# Patient Record
Sex: Female | Born: 1945
Health system: Southern US, Community
[De-identification: ages and names within clinical notes are randomized; demographics above are authoritative.]

## PROBLEM LIST (undated history)

## (undated) DIAGNOSIS — F32A Depression, unspecified: Secondary | ICD-10-CM

## (undated) DIAGNOSIS — Z5189 Encounter for other specified aftercare: Secondary | ICD-10-CM

## (undated) DIAGNOSIS — F329 Major depressive disorder, single episode, unspecified: Secondary | ICD-10-CM

## (undated) DIAGNOSIS — G709 Myoneural disorder, unspecified: Secondary | ICD-10-CM

## (undated) HISTORY — PX: BRAIN SURGERY: SHX531

## (undated) HISTORY — DX: Depression, unspecified: F32.A

## (undated) HISTORY — DX: Myoneural disorder, unspecified: G70.9

## (undated) HISTORY — DX: Encounter for other specified aftercare: Z51.89

## (undated) HISTORY — PX: TUBAL LIGATION: SHX77

## (undated) HISTORY — DX: Major depressive disorder, single episode, unspecified: F32.9

---

## 2004-08-31 ENCOUNTER — Emergency Department (HOSPITAL_COMMUNITY): Admission: EM | Admit: 2004-08-31 | Discharge: 2004-08-31 | Payer: Self-pay | Admitting: *Deleted

## 2004-10-26 ENCOUNTER — Emergency Department (HOSPITAL_COMMUNITY): Admission: EM | Admit: 2004-10-26 | Discharge: 2004-10-26 | Payer: Self-pay | Admitting: Emergency Medicine

## 2005-03-10 ENCOUNTER — Emergency Department (HOSPITAL_COMMUNITY): Admission: EM | Admit: 2005-03-10 | Discharge: 2005-03-10 | Payer: Self-pay | Admitting: Emergency Medicine

## 2005-04-03 ENCOUNTER — Emergency Department (HOSPITAL_COMMUNITY): Admission: EM | Admit: 2005-04-03 | Discharge: 2005-04-03 | Payer: Self-pay | Admitting: Internal Medicine
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
## 2005-11-16 ENCOUNTER — Emergency Department (HOSPITAL_COMMUNITY): Admission: EM | Admit: 2005-11-16 | Discharge: 2005-11-16 | Payer: Self-pay | Admitting: Emergency Medicine

## 2005-11-24 ENCOUNTER — Emergency Department (HOSPITAL_COMMUNITY): Admission: EM | Admit: 2005-11-24 | Discharge: 2005-11-24 | Payer: Self-pay | Admitting: Emergency Medicine

## 2006-02-09 ENCOUNTER — Ambulatory Visit: Payer: Self-pay | Admitting: Internal Medicine

## 2006-03-12 ENCOUNTER — Emergency Department (HOSPITAL_COMMUNITY): Admission: EM | Admit: 2006-03-12 | Discharge: 2006-03-12 | Payer: Self-pay | Admitting: Emergency Medicine

## 2006-04-10 ENCOUNTER — Emergency Department (HOSPITAL_COMMUNITY): Admission: EM | Admit: 2006-04-10 | Discharge: 2006-04-10 | Payer: Self-pay | Admitting: Emergency Medicine

## 2006-05-30 ENCOUNTER — Emergency Department (HOSPITAL_COMMUNITY): Admission: EM | Admit: 2006-05-30 | Discharge: 2006-05-30 | Payer: Self-pay | Admitting: Emergency Medicine

## 2006-08-21 ENCOUNTER — Emergency Department (HOSPITAL_COMMUNITY): Admission: EM | Admit: 2006-08-21 | Discharge: 2006-08-21 | Payer: Self-pay | Admitting: Emergency Medicine

## 2006-08-21 IMAGING — CR DG CHEST 2V
2 series · 2 of 2 positions shown · non-contrast
Comparison: [DATE].

CLINICAL DATA: Cough/fever/chills.
 CHEST - 2 VIEW:

[w chest pa]
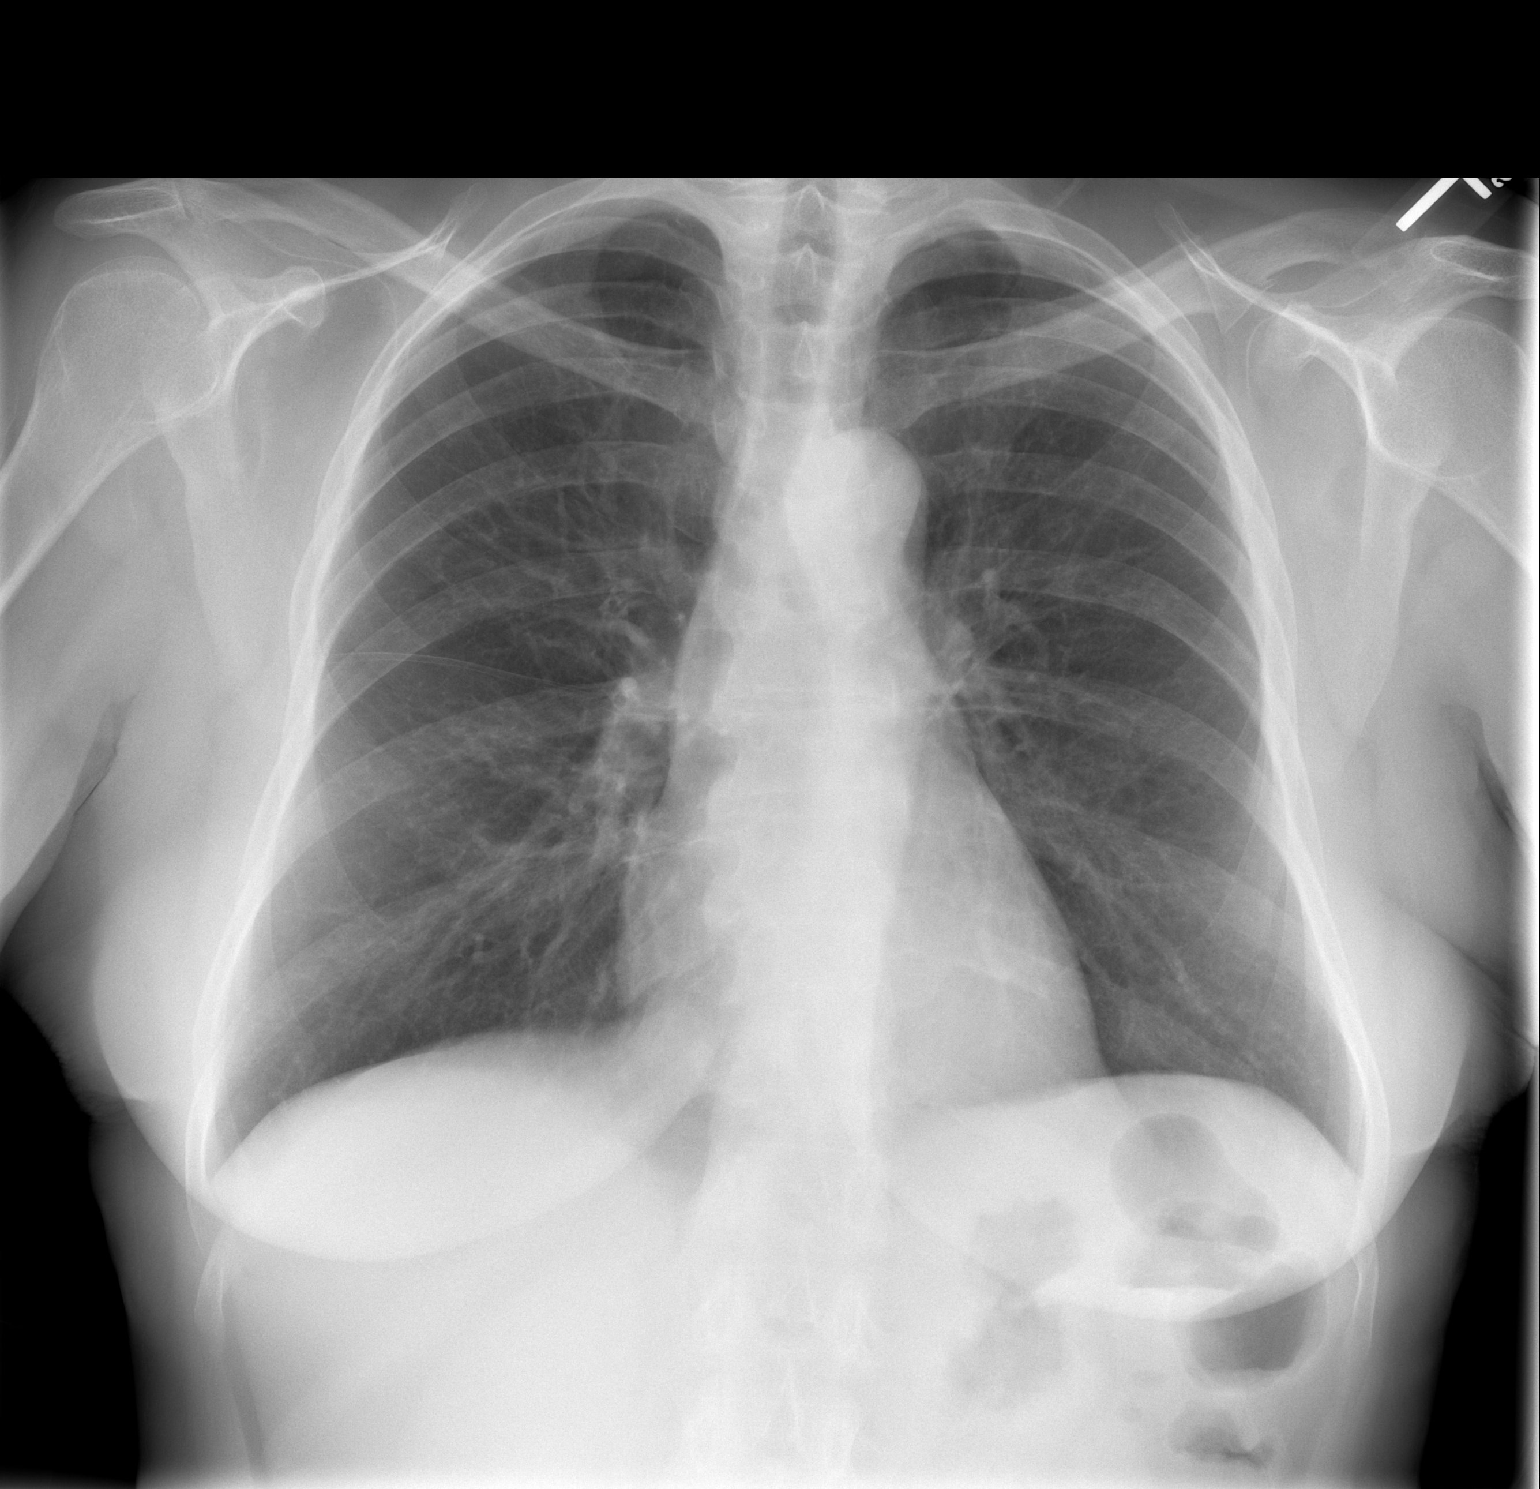

[w chest lat]
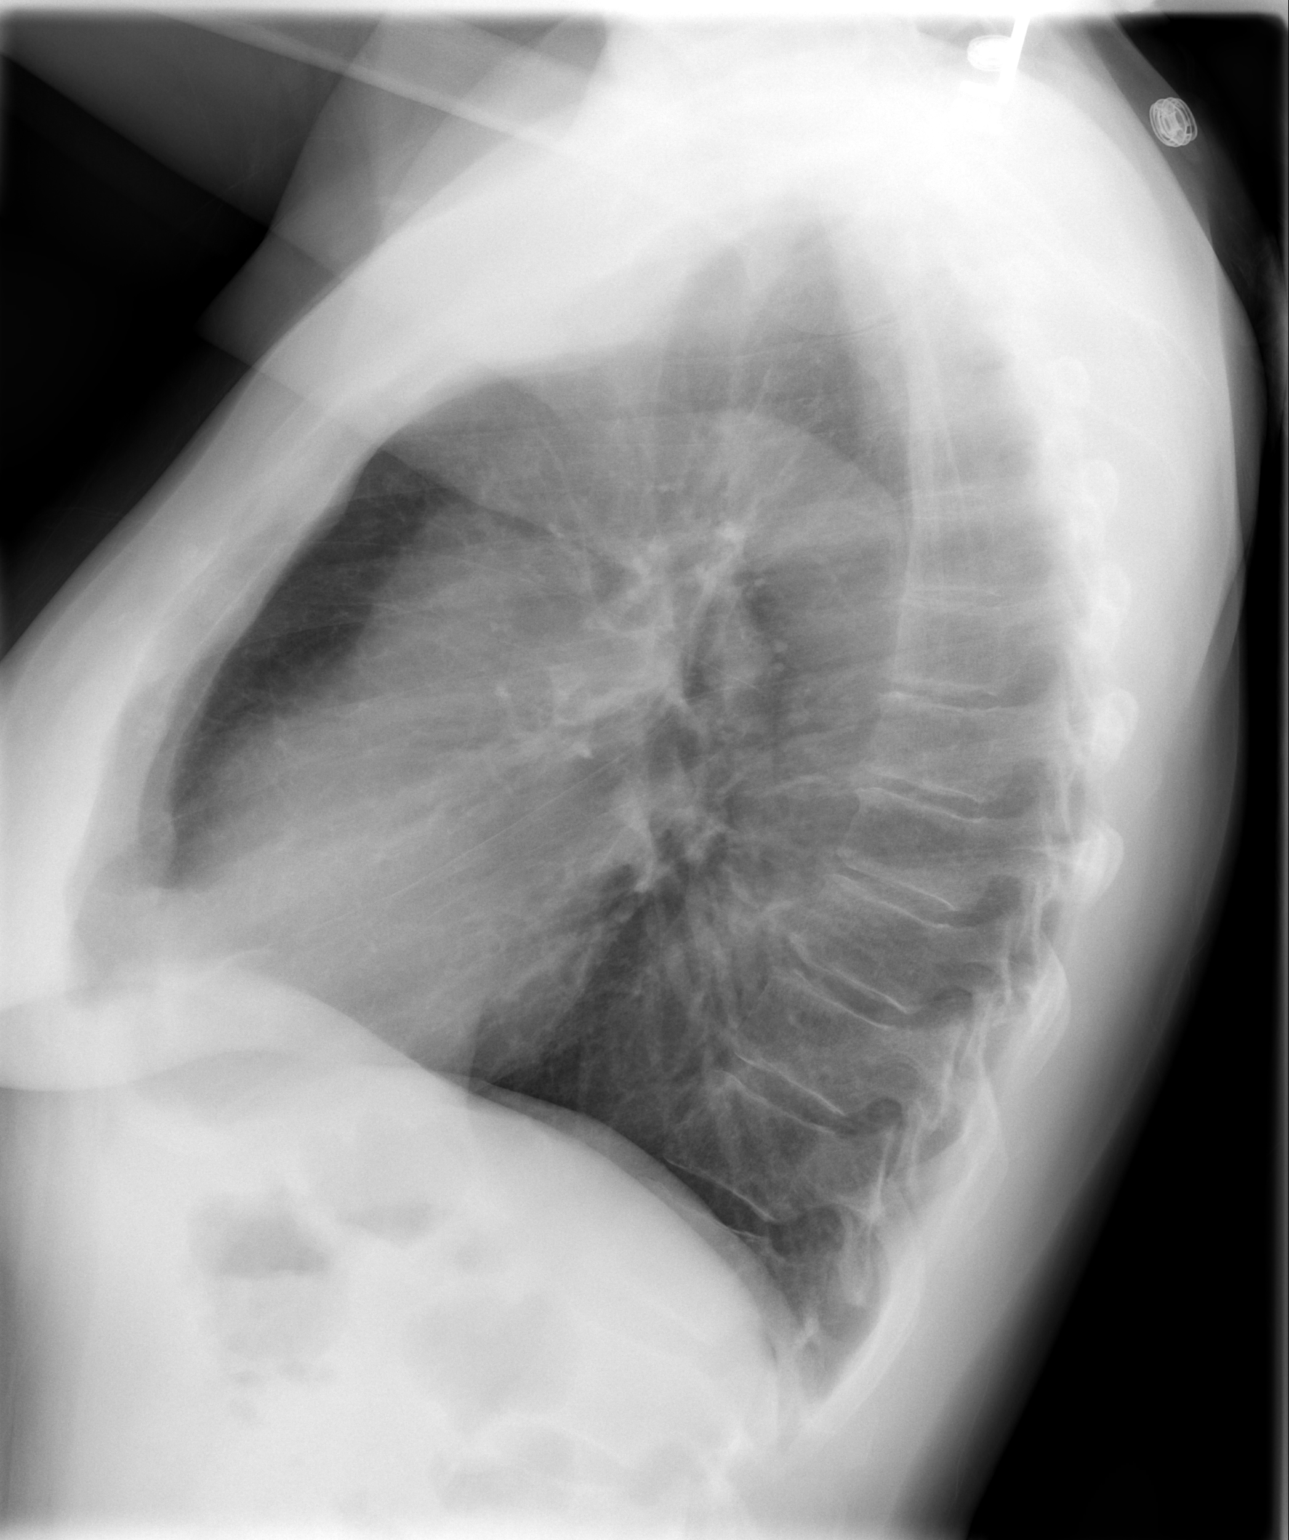

[2 of 2 positions shown; findings below may reference images not displayed]

FINDINGS: Heart and vascularity normal.  Slight peribronchial thickening as before. No acute process. No pleural fluid or osseous lesions.
IMPRESSION: No active disease.

## 2006-09-13 ENCOUNTER — Emergency Department (HOSPITAL_COMMUNITY): Admission: EM | Admit: 2006-09-13 | Discharge: 2006-09-13 | Payer: Self-pay | Admitting: Emergency Medicine
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
## 2007-02-13 ENCOUNTER — Emergency Department (HOSPITAL_COMMUNITY): Admission: EM | Admit: 2007-02-13 | Discharge: 2007-02-13 | Payer: Self-pay | Admitting: Emergency Medicine

## 2008-08-20 ENCOUNTER — Encounter: Admission: RE | Admit: 2008-08-20 | Discharge: 2008-08-20 | Payer: Self-pay | Admitting: Emergency Medicine

## 2008-08-20 ENCOUNTER — Emergency Department (HOSPITAL_COMMUNITY): Admission: EM | Admit: 2008-08-20 | Discharge: 2008-08-20 | Payer: Self-pay | Admitting: Emergency Medicine

## 2008-08-25 ENCOUNTER — Encounter: Admission: RE | Admit: 2008-08-25 | Discharge: 2008-08-25 | Payer: Self-pay | Admitting: Emergency Medicine

## 2008-09-01 ENCOUNTER — Encounter: Admission: RE | Admit: 2008-09-01 | Discharge: 2008-09-01 | Payer: Self-pay | Admitting: Emergency Medicine

## 2008-09-24 ENCOUNTER — Emergency Department (HOSPITAL_COMMUNITY): Admission: EM | Admit: 2008-09-24 | Discharge: 2008-09-24 | Payer: Self-pay | Admitting: Emergency Medicine

## 2010-01-22 ENCOUNTER — Encounter: Payer: Self-pay | Admitting: Emergency Medicine

## 2010-01-24 ENCOUNTER — Encounter: Payer: Self-pay | Admitting: Emergency Medicine

## 2010-04-08 LAB — COMPREHENSIVE METABOLIC PANEL
ALT: 16 U/L (ref 0–35)
AST: 17 U/L (ref 0–37)
Albumin: 3.3 g/dL — ABNORMAL LOW (ref 3.5–5.2)
Alkaline Phosphatase: 90 U/L (ref 39–117)
BUN: 13 mg/dL (ref 6–23)
CO2: 20 mEq/L (ref 19–32)
Calcium: 7.8 mg/dL — ABNORMAL LOW (ref 8.4–10.5)
Chloride: 110 mEq/L (ref 96–112)
Creatinine, Ser: 0.79 mg/dL (ref 0.4–1.2)
GFR calc Af Amer: >60 mL/min (ref >60)
GFR calc non Af Amer: >60 mL/min (ref >60)
Glucose, Bld: 123 mg/dL — ABNORMAL HIGH (ref 70–99)
Potassium: 3.4 mEq/L — ABNORMAL LOW (ref 3.5–5.1)
Sodium: 138 mEq/L (ref 135–145)
Total Bilirubin: 0.5 mg/dL (ref 0.3–1.2)
Total Protein: 5.6 g/dL — ABNORMAL LOW (ref 6.0–8.3)

## 2010-04-08 LAB — DIFFERENTIAL
Basophils Absolute: 0 10*3/uL (ref 0.0–0.1)
Basophils Relative: 0 % (ref 0–1)
Eosinophils Absolute: 0.1 10*3/uL (ref 0.0–0.7)
Eosinophils Relative: 1 % (ref 0–5)
Lymphocytes Relative: 20 % (ref 12–46)
Lymphs Abs: 1.2 10*3/uL (ref 0.7–4.0)
Monocytes Absolute: 0.3 10*3/uL (ref 0.1–1.0)
Monocytes Relative: 5 % (ref 3–12)
Neutro Abs: 4.8 10*3/uL (ref 1.7–7.7)
Neutrophils Relative %: 75 % (ref 43–77)

## 2010-04-08 LAB — CBC
HCT: 34.8 % — ABNORMAL LOW (ref 36.0–46.0)
Hemoglobin: 12.1 g/dL (ref 12.0–15.0)
MCHC: 34.6 g/dL (ref 30.0–36.0)
MCV: 85.8 fL (ref 78.0–100.0)
Platelets: 222 10*3/uL (ref 150–400)
RBC: 4.06 MIL/uL (ref 3.87–5.11)
RDW: 13 % (ref 11.5–15.5)
WBC: 6.4 10*3/uL (ref 4.0–10.5)

## 2010-04-08 LAB — URINE MICROSCOPIC-ADD ON

## 2010-04-08 LAB — URINALYSIS, ROUTINE W REFLEX MICROSCOPIC
Glucose, UA: NEGATIVE mg/dL
Hgb urine dipstick: NEGATIVE
Ketones, ur: 15 mg/dL — AB
Nitrite: POSITIVE — AB
Protein, ur: NEGATIVE mg/dL
Specific Gravity, Urine: 1.022 (ref 1.005–1.030)
Urobilinogen, UA: 0.2 mg/dL (ref 0.0–1.0)
pH: 5.5 (ref 5.0–8.0)

## 2010-04-08 LAB — LIPASE, BLOOD: Lipase: 37 U/L (ref 11–59)

## 2010-04-09 LAB — GC/CHLAMYDIA PROBE AMP, GENITAL
Chlamydia, DNA Probe: NEGATIVE
GC Probe Amp, Genital: NEGATIVE

## 2010-04-09 LAB — WET PREP, GENITAL
Clue Cells Wet Prep HPF POC: NONE SEEN
Yeast Wet Prep HPF POC: NONE SEEN

## 2010-06-14 ENCOUNTER — Other Ambulatory Visit: Payer: Self-pay | Admitting: Emergency Medicine

## 2010-06-14 DIAGNOSIS — Z1231 Encounter for screening mammogram for malignant neoplasm of breast: Secondary | ICD-10-CM

## 2010-06-20 ENCOUNTER — Ambulatory Visit: Payer: Self-pay

## 2010-09-23 LAB — URINALYSIS, ROUTINE W REFLEX MICROSCOPIC
Bilirubin Urine: NEGATIVE
Glucose, UA: NEGATIVE
Hgb urine dipstick: NEGATIVE
Ketones, ur: NEGATIVE
Nitrite: NEGATIVE
Protein, ur: NEGATIVE
Specific Gravity, Urine: 1.024
Urobilinogen, UA: 0.2
pH: 5.5

## 2010-09-23 LAB — WET PREP, GENITAL
Clue Cells Wet Prep HPF POC: NONE SEEN
Trich, Wet Prep: NONE SEEN
Yeast Wet Prep HPF POC: NONE SEEN

## 2010-09-23 LAB — GC/CHLAMYDIA PROBE AMP, GENITAL
Chlamydia, DNA Probe: NEGATIVE
GC Probe Amp, Genital: NEGATIVE

## 2010-09-23 LAB — URINE MICROSCOPIC-ADD ON

## 2010-10-02 ENCOUNTER — Ambulatory Visit (INDEPENDENT_AMBULATORY_CARE_PROVIDER_SITE_OTHER): Payer: 59

## 2010-10-02 ENCOUNTER — Inpatient Hospital Stay (INDEPENDENT_AMBULATORY_CARE_PROVIDER_SITE_OTHER)
Admission: RE | Admit: 2010-10-02 | Discharge: 2010-10-02 | Disposition: A | Discharge status: Home / Self Care | Payer: 59 | Source: Ambulatory Visit | Attending: Emergency Medicine | Admitting: Emergency Medicine

## 2010-10-02 ENCOUNTER — Ambulatory Visit (HOSPITAL_COMMUNITY): Payer: 59

## 2010-10-02 DIAGNOSIS — S63509A Unspecified sprain of unspecified wrist, initial encounter: Secondary | ICD-10-CM

## 2010-10-02 DIAGNOSIS — X58XXXA Exposure to other specified factors, initial encounter: Secondary | ICD-10-CM

## 2010-10-02 DIAGNOSIS — IMO0002 Reserved for concepts with insufficient information to code with codable children: Secondary | ICD-10-CM
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
## 2010-10-02 IMAGING — CR DG HAND COMPLETE 3+V*L*
3 series · 3 of 3 positions shown · non-contrast
Comparison: None.

CLINICAL DATA: Fell today with bilateral hand pain, worse at the
radial side of left wrist and first digit

LEFT HAND - COMPLETE 3+ VIEW

[view not recorded (1 of 3)]
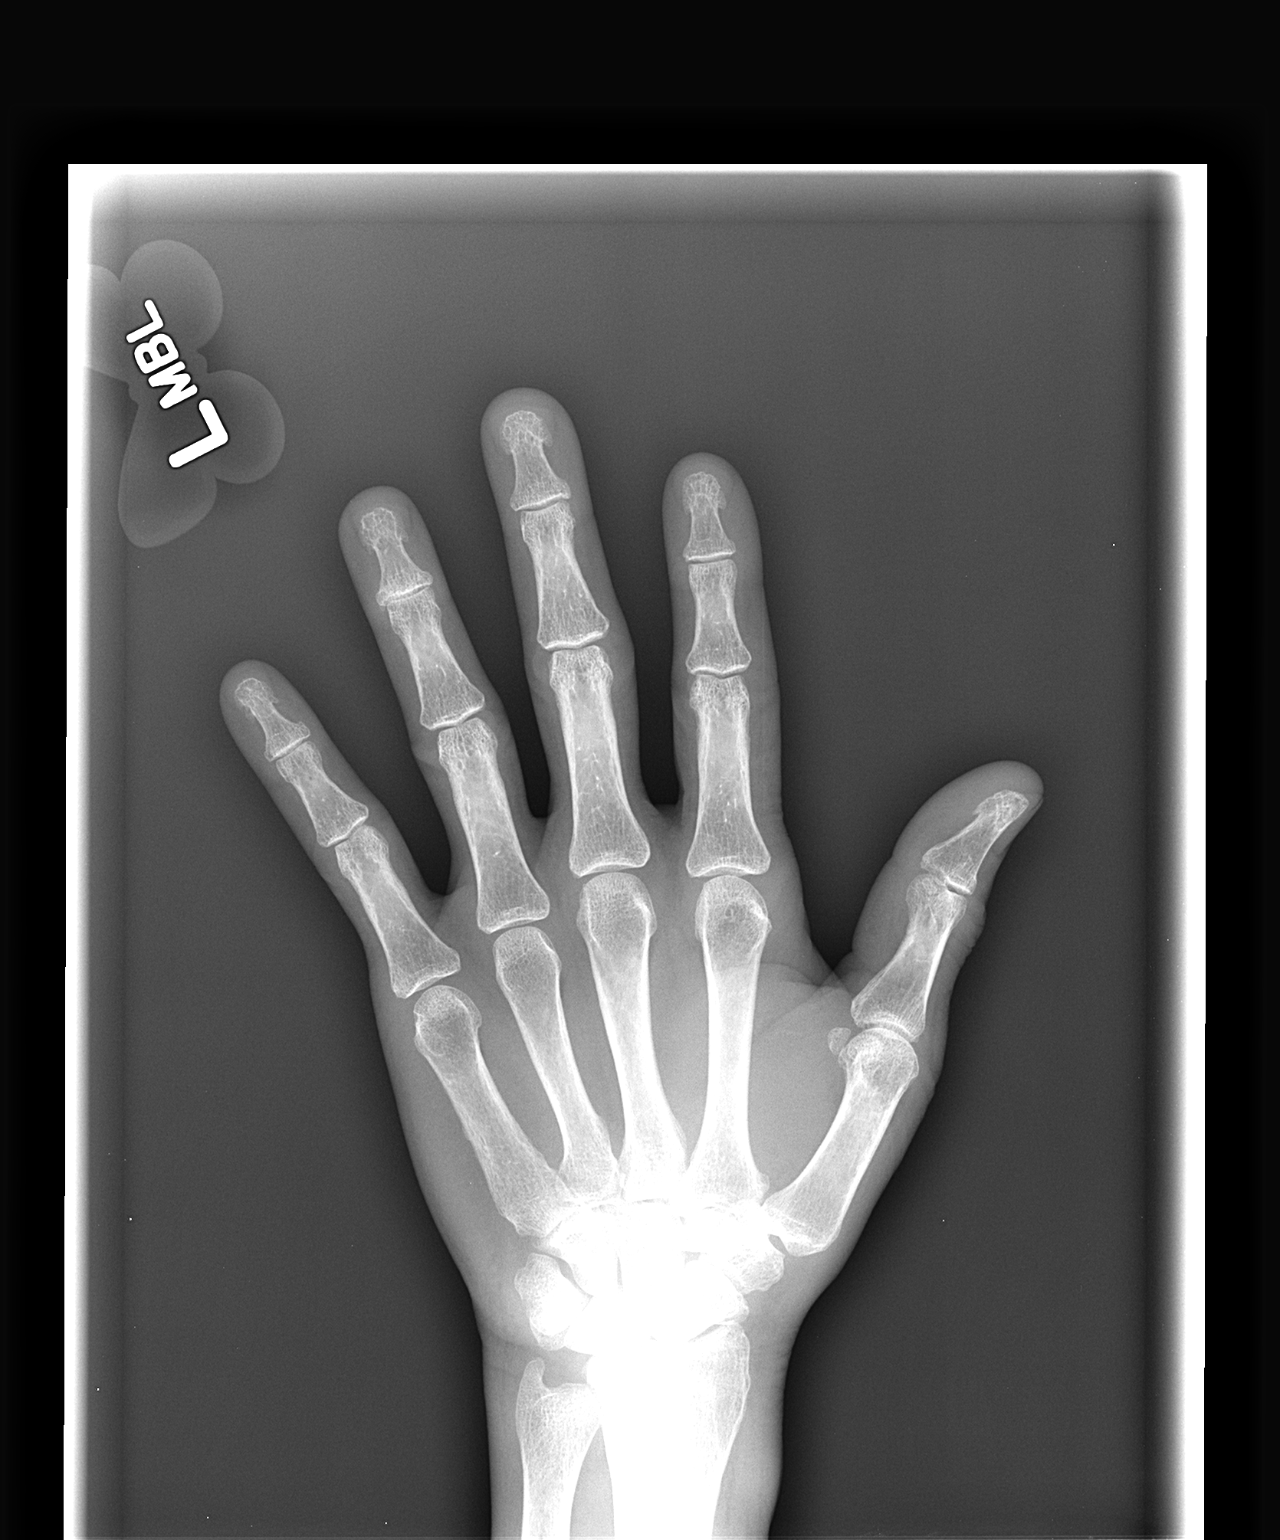

[view not recorded (2 of 3)]
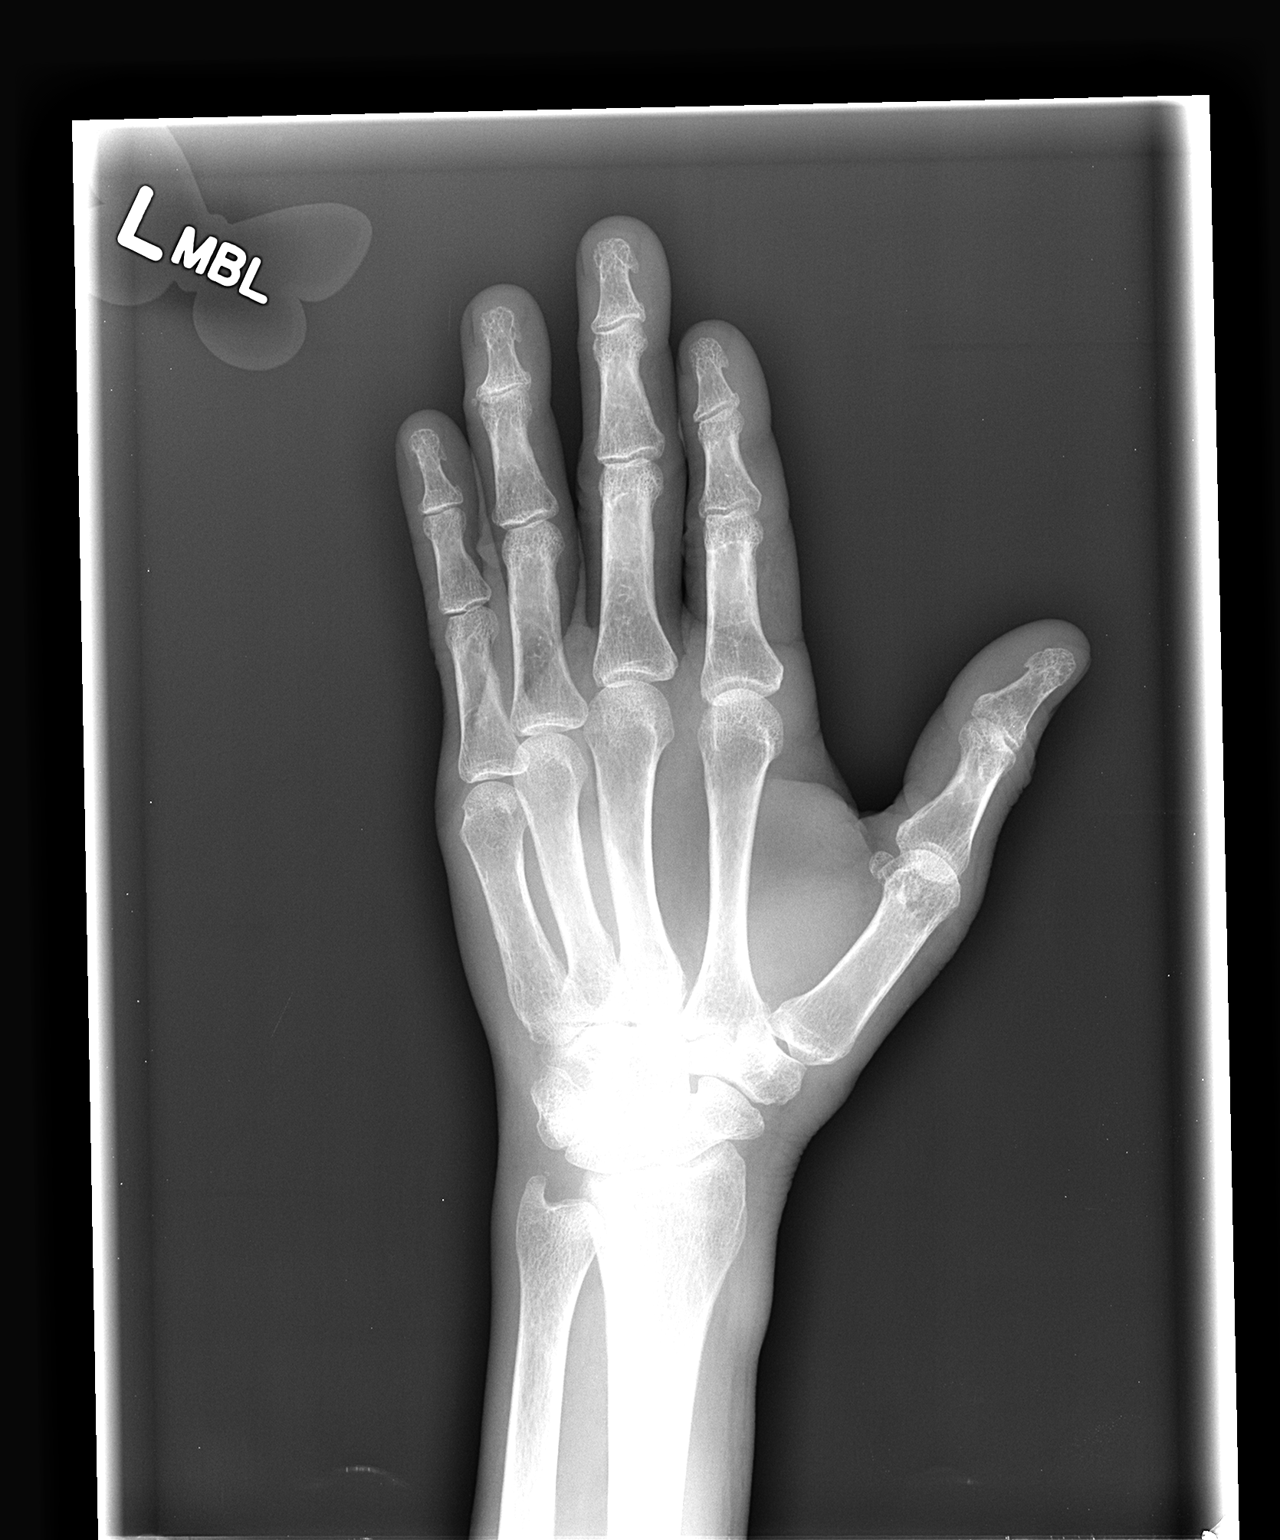

[view not recorded (3 of 3)]
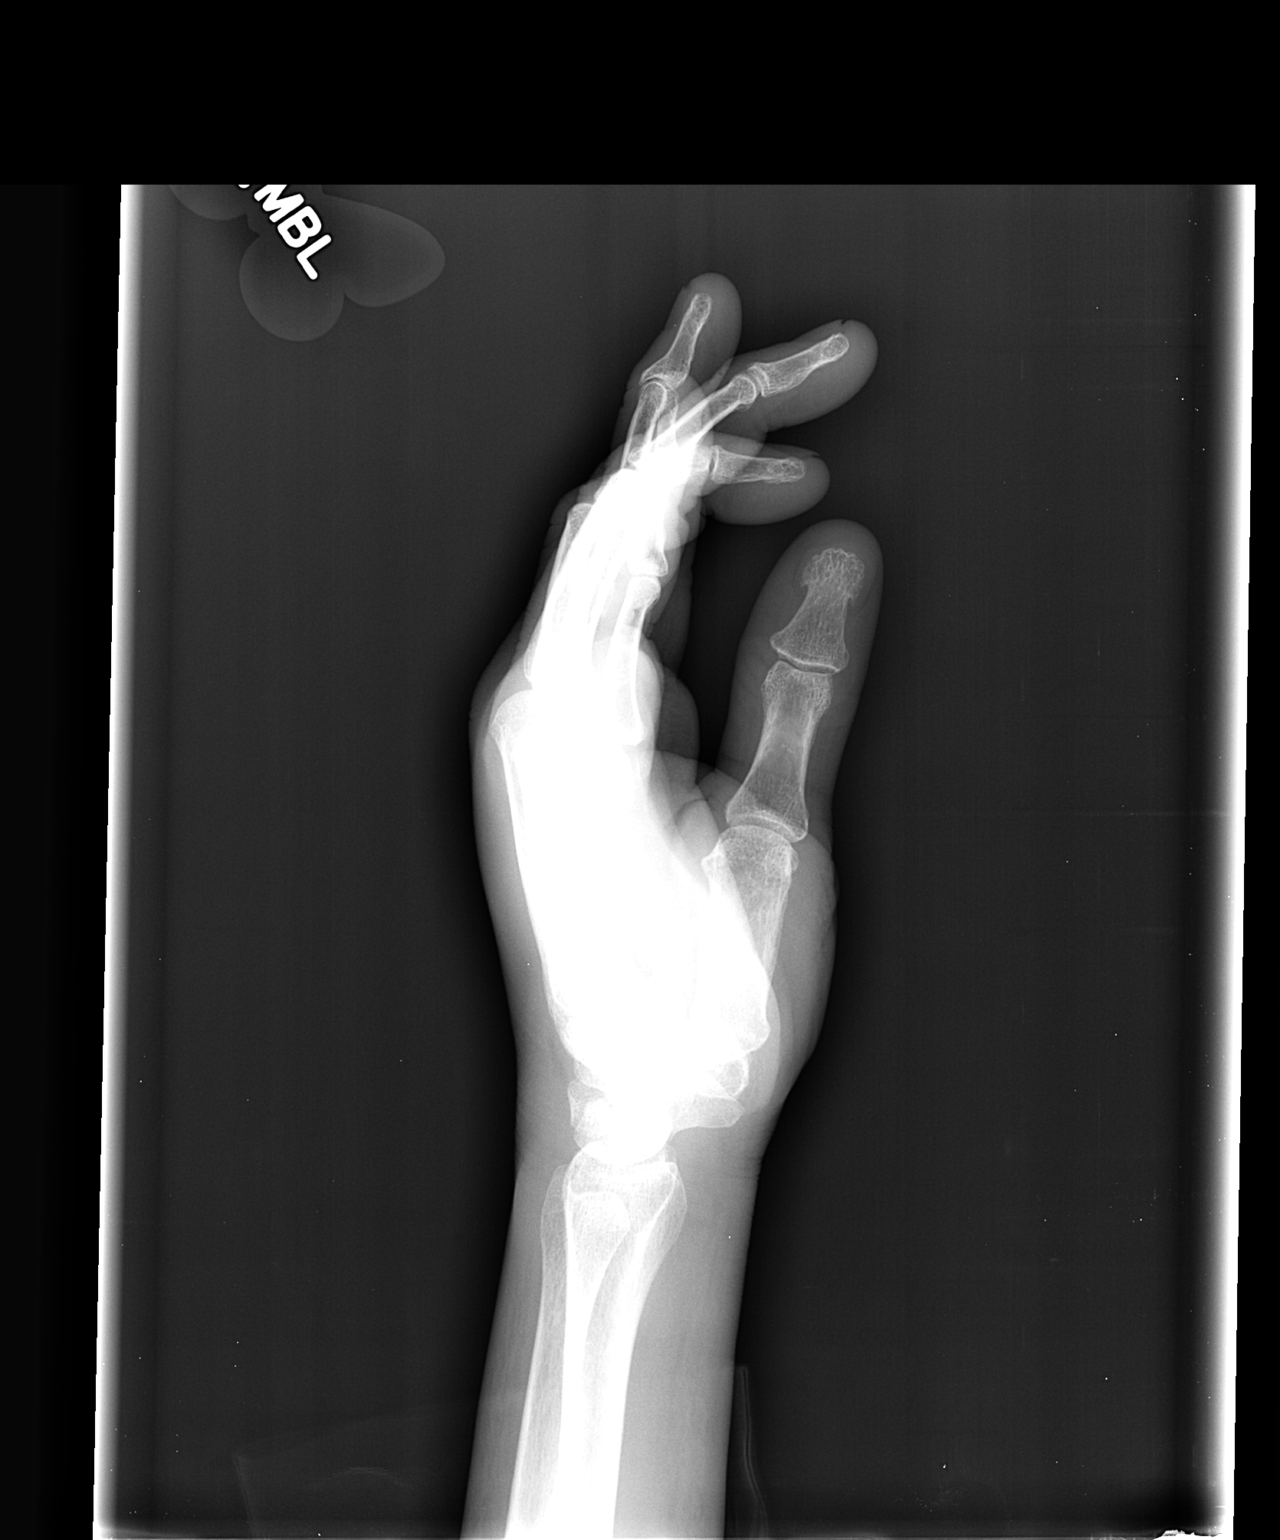

[3 of 3 positions shown; findings below may reference images not displayed]

FINDINGS: No fracture or dislocation. Small ossified structure
adjacent to the ulnar styloid process is favored to be the sequela
of remote injury.  Joint spaces are globally preserved.  No
erosions.  Regional soft tissues are normal.  No radiopaque foreign
body.
IMPRESSION: No fracture.

## 2010-10-14 LAB — URINALYSIS, ROUTINE W REFLEX MICROSCOPIC
Bilirubin Urine: NEGATIVE
Glucose, UA: NEGATIVE
Hgb urine dipstick: NEGATIVE
Ketones, ur: NEGATIVE
Nitrite: NEGATIVE
Protein, ur: NEGATIVE
Specific Gravity, Urine: 1.024
Urobilinogen, UA: 0.2
pH: 5.5

## 2010-10-14 LAB — URINE MICROSCOPIC-ADD ON

## 2010-10-14 LAB — WET PREP, GENITAL
Trich, Wet Prep: NONE SEEN
Yeast Wet Prep HPF POC: NONE SEEN

## 2010-10-14 LAB — URINE CULTURE
Colony Count: NO GROWTH
Culture: NO GROWTH

## 2010-10-14 LAB — GC/CHLAMYDIA PROBE AMP, GENITAL
Chlamydia, DNA Probe: NEGATIVE
GC Probe Amp, Genital: NEGATIVE

## 2010-10-14 LAB — RAPID STREP SCREEN (MED CTR MEBANE ONLY): Streptococcus, Group A Screen (Direct): NEGATIVE

## 2010-10-14 LAB — RPR: RPR Ser Ql: NONREACTIVE

## 2011-05-22 ENCOUNTER — Encounter (HOSPITAL_COMMUNITY): Payer: Self-pay | Admitting: *Deleted

## 2011-05-22 ENCOUNTER — Emergency Department (HOSPITAL_COMMUNITY)
Admission: EM | Admit: 2011-05-22 | Discharge: 2011-05-22 | Disposition: A | Discharge status: Home / Self Care | Payer: Medicare Other | Attending: Emergency Medicine | Admitting: Emergency Medicine

## 2011-05-22 DIAGNOSIS — M79609 Pain in unspecified limb: Secondary | ICD-10-CM | POA: Insufficient documentation

## 2011-05-22 DIAGNOSIS — L84 Corns and callosities: Secondary | ICD-10-CM | POA: Insufficient documentation

## 2011-05-22 MED ORDER — HYDROCODONE-ACETAMINOPHEN 5-325 MG PO TABS: 1.0000 | ORAL_TABLET | ORAL | Status: AC | PRN

## 2011-05-22 MED ORDER — HYDROCODONE-ACETAMINOPHEN 5-325 MG PO TABS
1.0000 | ORAL_TABLET | Freq: Once | ORAL | Status: AC
  Administered 2011-05-22: 1 via ORAL
  Filled 2011-05-22: qty 1

## 2011-05-22 NOTE — ED Provider Notes (Signed)
History     CSN: [id number]  Arrival date & time 05/22/11  1324   First MD Initiated Contact with Patient 05/22/11 [phone]      Chief Complaint  Patient presents with  . Toe Pain    (Consider location/radiation/quality/duration/timing/severity/associated sxs/prior treatment) Patient is a 66 y.o. female presenting with toe pain. The history is provided by the patient.  Toe Pain The current episode started 1 to 4 weeks ago. The problem occurs constantly. The problem has been gradually worsening. Pertinent negatives include no fever. Associated symptoms comments: Painful growth on right 5th toe. No drainage, redness. .    No past medical history on file.  Past Surgical History  Procedure Date  . Brain surgery     No family history on file.  History  Substance Use Topics  . Smoking status: Not on file  . Smokeless tobacco: Not on file  . Alcohol Use: 1.2 oz/week    2 Cans of beer per week    OB History    Grav Para Term Preterm Abortions TAB SAB Ect Mult Living                  Review of Systems  Constitutional: Negative for fever.  Musculoskeletal: Negative.   Skin:       See HPI.    Allergies  Review of patient's allergies indicates no known allergies.  Home Medications   Current Outpatient Rx  Name Route Sig Dispense Refill  . BONE DENSITY PO Oral Take 1 tablet by mouth daily.    Marland Kitchen VITAMIN D PO Oral Take 1 tablet by mouth daily.      BP 134/64  Pulse 68  Temp(Src) 98.2 F (36.8 C) (Oral)  Resp 16  SpO2 98%  Physical Exam  Skin:       Right 5th toe with circular, hyperkertinized lesion c/w corn. No fluctuance, redness, drainage. Significantly tender.    ED Course  Procedures (including critical care time)  Labs Reviewed - No data to display No results found.   No diagnosis found. 1. Corn, right 5th toe   MDM  No abscess or secondary abscess. Will treat pain and refer to ortho, podiatry.        Rodena Medin, PA-C 05/22/11  0900

## 2011-05-22 NOTE — Discharge Instructions (Signed)
FOLLOW UP WITH A PODIATRIST OR AN ORTHOPEDIST FOR FURTHER MANAGEMENT OF PAINFUL CORN ON RIGHT FOOT. TAKE NORCO FOR PAIN AS DIRECTED. RETURN HERE AS NEEDED.  Corns and Calluses Corns are small areas of thickened skin that usually occur on the top, sides, or tip of a toe. They contain a cone-shaped core with a point that can press on a nerve below. This causes pain. Calluses are areas of thickened skin that usually develop on hands, fingers, palms, soles of the feet, and heels. These are areas that experience frequent friction or pressure. CAUSES  Corns are usually the result of rubbing (friction) or pressure from shoes that are too tight or do not fit properly. Calluses are caused by repeated friction and pressure on the affected areas. SYMPTOMS  A hard growth on the skin.   Pain or tenderness under the skin.   Sometimes, redness and swelling.   Increased discomfort while wearing tight-fitting shoes.  DIAGNOSIS  Your caregiver can usually tell what the problem is by doing a physical exam. TREATMENT  Removing the cause of the friction or pressure is usually the only treatment needed. However, sometimes medicines can be used to help soften the hardened, thickened areas. These medicines include salicylic acid plasters and 12% ammonium lactate lotion. These medicines should only be used under the direction of your caregiver. HOME CARE INSTRUCTIONS   Try to remove pressure from the affected area.   You may wear donut-shaped corn pads to protect your skin.   You may use a pumice stone or nonmetallic nail file to gently reduce the thickness of a corn.   Wear properly fitted footwear.   If you have calluses on the hands, wear gloves during activities that cause friction.   If you have diabetes, you should regularly examine your feet. Tell your caregiver if you notice any problems with your feet.  SEEK IMMEDIATE MEDICAL CARE IF:   You have increased pain, swelling, redness, or warmth in the  affected area.   Your corn or callus starts to drain fluid or bleeds.   You are not getting better, even with treatment.  Document Released: 09/25/2003 Document Revised: 12/08/2010 Document Reviewed: 08/16/2010 [facility] Patient Information 2012 [place], [place].

## 2011-05-22 NOTE — ED Notes (Signed)
Pt has had pain in her 5th toe of right foot for one month. Pain is worsening, has tried corn remover and to remove the corn herself. Pain is "shooting through my foot".

## 2011-05-24 NOTE — ED Provider Notes (Signed)
Medical screening examination/treatment/procedure(s) were performed by non-physician practitioner and as supervising physician I was immediately available for consultation/collaboration.   Gavin Pound. Brick Ketcher, MD 05/24/11 1554

## 2011-06-01 DIAGNOSIS — G500 Trigeminal neuralgia: Secondary | ICD-10-CM | POA: Insufficient documentation

## 2012-01-23 ENCOUNTER — Other Ambulatory Visit: Payer: Self-pay | Admitting: Emergency Medicine

## 2012-01-23 DIAGNOSIS — Z1231 Encounter for screening mammogram for malignant neoplasm of breast: Secondary | ICD-10-CM

## 2012-02-07 ENCOUNTER — Ambulatory Visit: Payer: Medicare Other

## 2012-12-02 ENCOUNTER — Ambulatory Visit: Payer: PRIVATE HEALTH INSURANCE

## 2012-12-02 ENCOUNTER — Ambulatory Visit (INDEPENDENT_AMBULATORY_CARE_PROVIDER_SITE_OTHER): Payer: PRIVATE HEALTH INSURANCE | Admitting: Emergency Medicine

## 2012-12-02 DIAGNOSIS — M20012 Mallet finger of left finger(s): Secondary | ICD-10-CM

## 2012-12-02 DIAGNOSIS — K219 Gastro-esophageal reflux disease without esophagitis: Secondary | ICD-10-CM

## 2012-12-02 DIAGNOSIS — M20019 Mallet finger of unspecified finger(s): Secondary | ICD-10-CM

## 2012-12-02 DIAGNOSIS — M79609 Pain in unspecified limb: Secondary | ICD-10-CM

## 2012-12-02 LAB — POCT CBC
Granulocyte percent: 52.4 %G (ref 37–80)
HCT, POC: 47 % (ref 37.7–47.9)
Hemoglobin: 14.6 g/dL (ref 12.2–16.2)
Lymph, poc: 2.9 (ref 0.6–3.4)
MCH, POC: 28 pg (ref 27–31.2)
MCHC: 31.1 g/dL — AB (ref 31.8–35.4)
MCV: 90 fL (ref 80–97)
MID (cbc): 0.4 (ref 0–0.9)
MPV: 9.3 fL (ref 0–99.8)
POC Granulocyte: 3.6 (ref 2–6.9)
POC LYMPH PERCENT: 41.7 %L (ref 10–50)
POC MID %: 5.9 %M (ref 0–12)
Platelet Count, POC: 327 10*3/uL (ref 142–424)
RBC: 5.22 M/uL (ref 4.04–5.48)
RDW, POC: 13.3 %
WBC: 6.9 10*3/uL (ref 4.6–10.2)

## 2012-12-02 IMAGING — CR DG FINGER RING 2+V*L*
1 series · 1 of 1 positions shown · non-contrast
Comparison: Left hand radiographs [DATE]

CLINICAL DATA: Pain left ring finger

EXAM:
LEFT RING FINGER 2+V

[PA]
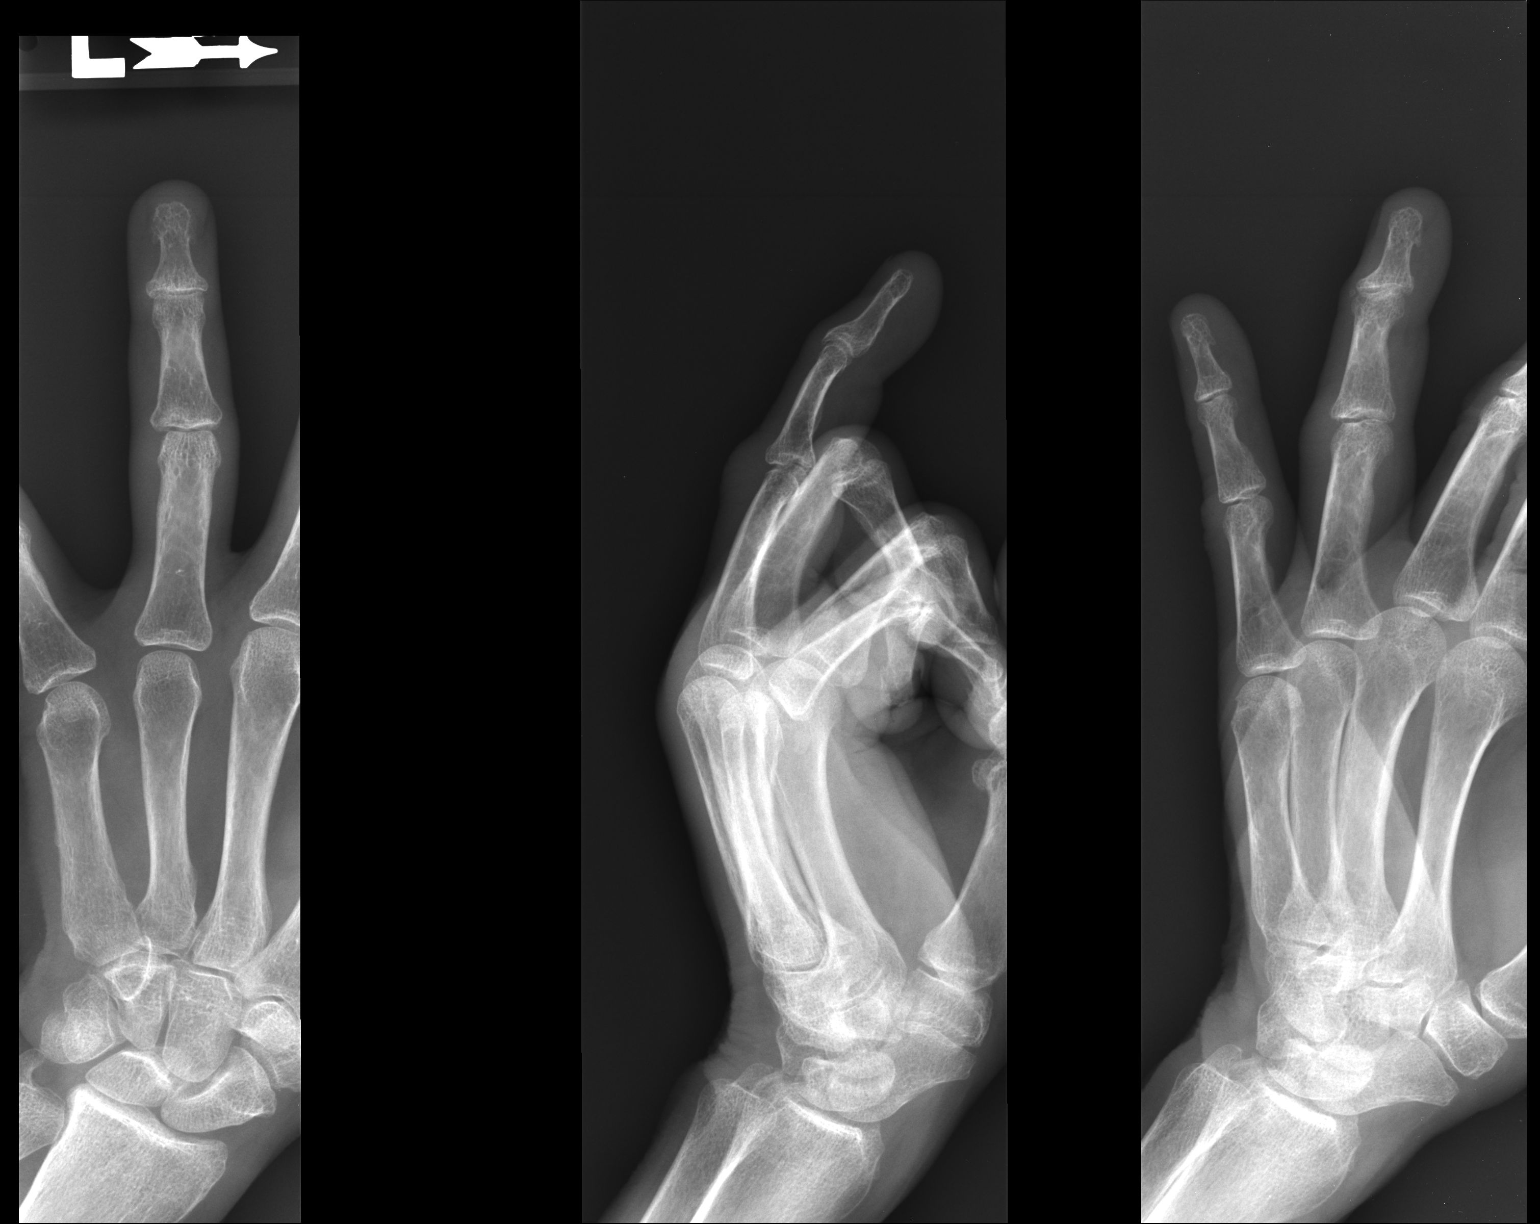

[1 of 1 positions shown; findings below may reference images not displayed]

FINDINGS: Soft tissue swelling at PIP joint.

Mild scattered joint space narrowing.

Bones appear demineralized.

Probable bone destruction at the base of the middle phalanx of the
left ring finger.

This could be the sequela of an arthritic process or prior trauma.

No definite acute fracture, dislocation or bone destruction.
IMPRESSION: Probable destructive process of the base of the middle phalanx
involving the articular surface, question sequela of an arthritic
process or prior trauma.

Scattered degenerative changes and osseous demineralization.

## 2012-12-02 MED ORDER — BONE DENSITY 300-200 MG-UNIT PO TABS: 1.0000 | ORAL_TABLET | Freq: Two times a day (BID) | ORAL | Status: DC

## 2012-12-02 MED ORDER — LANSOPRAZOLE 30 MG PO CPDR: 30.0000 mg | DELAYED_RELEASE_CAPSULE | Freq: Every day | ORAL | Status: DC

## 2012-12-02 MED ORDER — SUCRALFATE 1 G PO TABS: ORAL_TABLET | ORAL | Status: DC

## 2012-12-02 MED ORDER — CHOLECALCIFEROL 1.25 MG (50000 UT) PO TABS: 1.0000 | ORAL_TABLET | ORAL | Status: DC

## 2012-12-02 NOTE — Progress Notes (Signed)
[facility] [address] [place] [address] [phone]  Date:  12/02/2012   Name:  Tracy Barton   DOB:  Nov 08, 1945   MRN:  [id number]  PCP:  Lucilla Edin, MD    Chief Complaint: Hand Pain and Medication Refill   History of Present Illness:  Tracy Barton is a 67 y.o. very pleasant female patient who presents with the following:  Comes to office for medication refill.  Says she needs vitamin D, a calcium supplement and something she takes for reflux.  Has not been here since before Epic.  Has frequent reflux symptoms with heartburn with anything that she eats. No excess caffeine or alcohol.  No NSAID or ASA frequently.  No GI bleeding, nausea or vomiting.  No stool change.  Injured her left fourth finger when she jammed it on Sunday.  No improvement with over the counter medications or other home remedies. Denies other complaint or health concern today.   There are no active problems to display for this patient.   Past Medical History  Diagnosis Date  . Neuromuscular disorder   . Depression     Past Surgical History  Procedure Laterality Date  . Brain surgery      History  Substance Use Topics  . Smoking status: Current Some Day Smoker -- 0.50 packs/day  . Smokeless tobacco: Not on file  . Alcohol Use: 1.2 oz/week    2 Cans of beer per week    No family history on file.  No Known Allergies  Medication list has been reviewed and updated.  Current Outpatient Prescriptions on File Prior to Visit  Medication Sig Dispense Refill  . Calcium-Vit D-Arg-Inos-Silicon (BONE DENSITY PO) Take 1 tablet by mouth daily.      . Cholecalciferol (VITAMIN D PO) Take 1 tablet by mouth daily.       No current facility-administered medications on file prior to visit.    Review of Systems:  As per HPI, otherwise negative.    Physical Examination: Filed Vitals:   12/02/12 1441  BP: 118/62  Pulse: 68  Temp: 98.3 F (36.8 C)  Resp: 16   Filed  Vitals:   12/02/12 1441  Height: 5\' 4"  (1.626 m)  Weight: 117 lb 6.4 oz (53.252 kg)   Body mass index is 20.14 kg/(m^2). Ideal Body Weight: Weight in (lb) to have BMI = 25: 145.3  GEN: WDWN, NAD, Non-toxic, A & O x 3 HEENT: Atraumatic, Normocephalic. Neck supple. No masses, No LAD. Ears and Nose: No external deformity. CV: RRR, No M/G/R. No JVD. No thrill. No extra heart sounds. PULM: CTA B, no wheezes, crackles, rhonchi. No retractions. No resp. distress. No accessory muscle use. ABD: S, NT, ND, +BS. No rebound. No HSM. EXTR: No c/c/e NEURO Normal gait.  PSYCH: Normally interactive. Conversant. Not depressed or anxious appearing.  Calm demeanor.  LEFT hand:  Visible apparent mallet finger deformity with local tenderness at DIP.  Assessment and Plan: Mallet finger Ortho consult GERD Labs  Signed,  Phillips Odor, MD   [facility] reading (PRIMARY) by  Dr. Dareen Piano.  No fracture.  Results for orders placed in visit on 12/02/12  POCT CBC      Result Value Range   WBC 6.9  4.6 - 10.2 K/uL   Lymph, poc 2.9  0.6 - 3.4   POC LYMPH PERCENT 41.7  10 - 50 %L   MID (cbc) 0.4  0 - 0.9   POC MID % 5.9  0 - 12 %M   POC Granulocyte 3.6  2 - 6.9   Granulocyte percent 52.4  37 - 80 %G   RBC 5.22  4.04 - 5.48 M/uL   Hemoglobin 14.6  12.2 - 16.2 g/dL   HCT, POC 16.1  09.6 - 47.9 %   MCV 90.0  80 - 97 fL   MCH, POC 28.0  27 - 31.2 pg   MCHC 31.1 (*) 31.8 - 35.4 g/dL   RDW, POC 04.5     Platelet Count, POC 327  142 - 424 K/uL   MPV 9.3  0 - 99.8 fL

## 2012-12-02 NOTE — Patient Instructions (Signed)
Place gastroesophageal reflux disease patient instructions here. Mallet Finger A mallet, or jammed, finger occurs when the end of a straightened finger or thumb receives a blow (often from a ball). This causes a disruption (tearing) of the extensor tendon (cord like structure which attaches muscle to bone) that straightens the end of your finger. The last joint in your finger will droop and you cannot extend it. Sometimes this is associated with a small fracture (break in bone) of the base of the end bone (phalange) in your finger. It usually takes 4 to 5 weeks to heal. HOME CARE INSTRUCTIONS   Apply ice to the sore finger for 15-20 minutes, 03-04 times per day for 2 days. Put the ice in a plastic bag and place a towel between the bag of ice and your skin.  If you have a finger splint, wear your splint as directed.  You may remove the splint to wash your finger or as directed.  If your splint is off, do not try to bend the tip of your finger.  Put your splint back on as soon as possible. If your finger is numb or tingling, the splint is probably too tight. You can loosen it so it is comfortable.  Move the part of your injured finger that is not covered by the splint several times a day.  Take medications as directed by your caregiver. Only take over-the-counter or prescription medicines for pain, discomfort, or fever as directed by your caregiver.  IMPORTANT: follow up with your caregiver or keep or call for any appointments with specialists as directed. The failure to follow up could result in chronic pain and / or disability. SEEK MEDICAL CARE IF:   You have increased pain or swelling.  You notice coldness of your finger.  After treatment you still cannot extend your finger. SEEK IMMEDIATE MEDICAL CARE IF:  Your finger is swollen and very red, white, blue, numb, cold, or tingling. MAKE SURE YOU:   Understand these instructions.  Will watch your condition.  Will get help right away  if you are not doing well or get worse. Document Released: 12/17/1999 Document Revised: 03/13/2011 Document Reviewed: 08/02/2007 [facility] Patient Information 2014 [place], [place].

## 2012-12-03 LAB — H. PYLORI ANTIBODY, IGG: H Pylori IgG: 7.23 {ISR} — ABNORMAL HIGH

## 2012-12-04 ENCOUNTER — Other Ambulatory Visit: Payer: Self-pay | Admitting: Emergency Medicine

## 2012-12-04 MED ORDER — AMOXICILL-CLARITHRO-LANSOPRAZ PO MISC: Freq: Two times a day (BID) | ORAL | Status: DC

## 2012-12-05 ENCOUNTER — Other Ambulatory Visit: Payer: Self-pay | Admitting: Radiology

## 2012-12-05 MED ORDER — AMOXICILLIN 500 MG PO CAPS: 500.0000 mg | ORAL_CAPSULE | Freq: Two times a day (BID) | ORAL | Status: DC

## 2012-12-05 MED ORDER — CLARITHROMYCIN ER 500 MG PO TB24: 1000.0000 mg | ORAL_TABLET | Freq: Two times a day (BID) | ORAL | Status: DC

## 2012-12-05 NOTE — Telephone Encounter (Signed)
Insurance will not cover the prev pack, please send in the rx's split, she already has the prevacid, pended the others (review first make sure my sig/ quantity are what you desire)

## 2013-02-06 ENCOUNTER — Encounter (HOSPITAL_COMMUNITY): Payer: Self-pay | Admitting: Emergency Medicine

## 2013-02-06 ENCOUNTER — Emergency Department (HOSPITAL_COMMUNITY): Payer: Medicare Other

## 2013-02-06 ENCOUNTER — Emergency Department (HOSPITAL_COMMUNITY)
Admission: EM | Admit: 2013-02-06 | Discharge: 2013-02-06 | Disposition: A | Discharge status: Home / Self Care | Payer: Medicare Other | Attending: Emergency Medicine | Admitting: Emergency Medicine

## 2013-02-06 DIAGNOSIS — Z8669 Personal history of other diseases of the nervous system and sense organs: Secondary | ICD-10-CM | POA: Insufficient documentation

## 2013-02-06 DIAGNOSIS — Z79899 Other long term (current) drug therapy: Secondary | ICD-10-CM | POA: Insufficient documentation

## 2013-02-06 DIAGNOSIS — M25559 Pain in unspecified hip: Secondary | ICD-10-CM | POA: Insufficient documentation

## 2013-02-06 DIAGNOSIS — M25551 Pain in right hip: Secondary | ICD-10-CM

## 2013-02-06 DIAGNOSIS — F17200 Nicotine dependence, unspecified, uncomplicated: Secondary | ICD-10-CM | POA: Insufficient documentation

## 2013-02-06 DIAGNOSIS — Z8659 Personal history of other mental and behavioral disorders: Secondary | ICD-10-CM | POA: Insufficient documentation

## 2013-02-06 IMAGING — CR DG HIP COMPLETE 2+V*R*
3 series · 3 of 3 positions shown · non-contrast
Comparison: None.

CLINICAL DATA: Chronic right hip pain

EXAM:
RIGHT HIP - COMPLETE 2+ VIEW

[t pelvis a.p.]
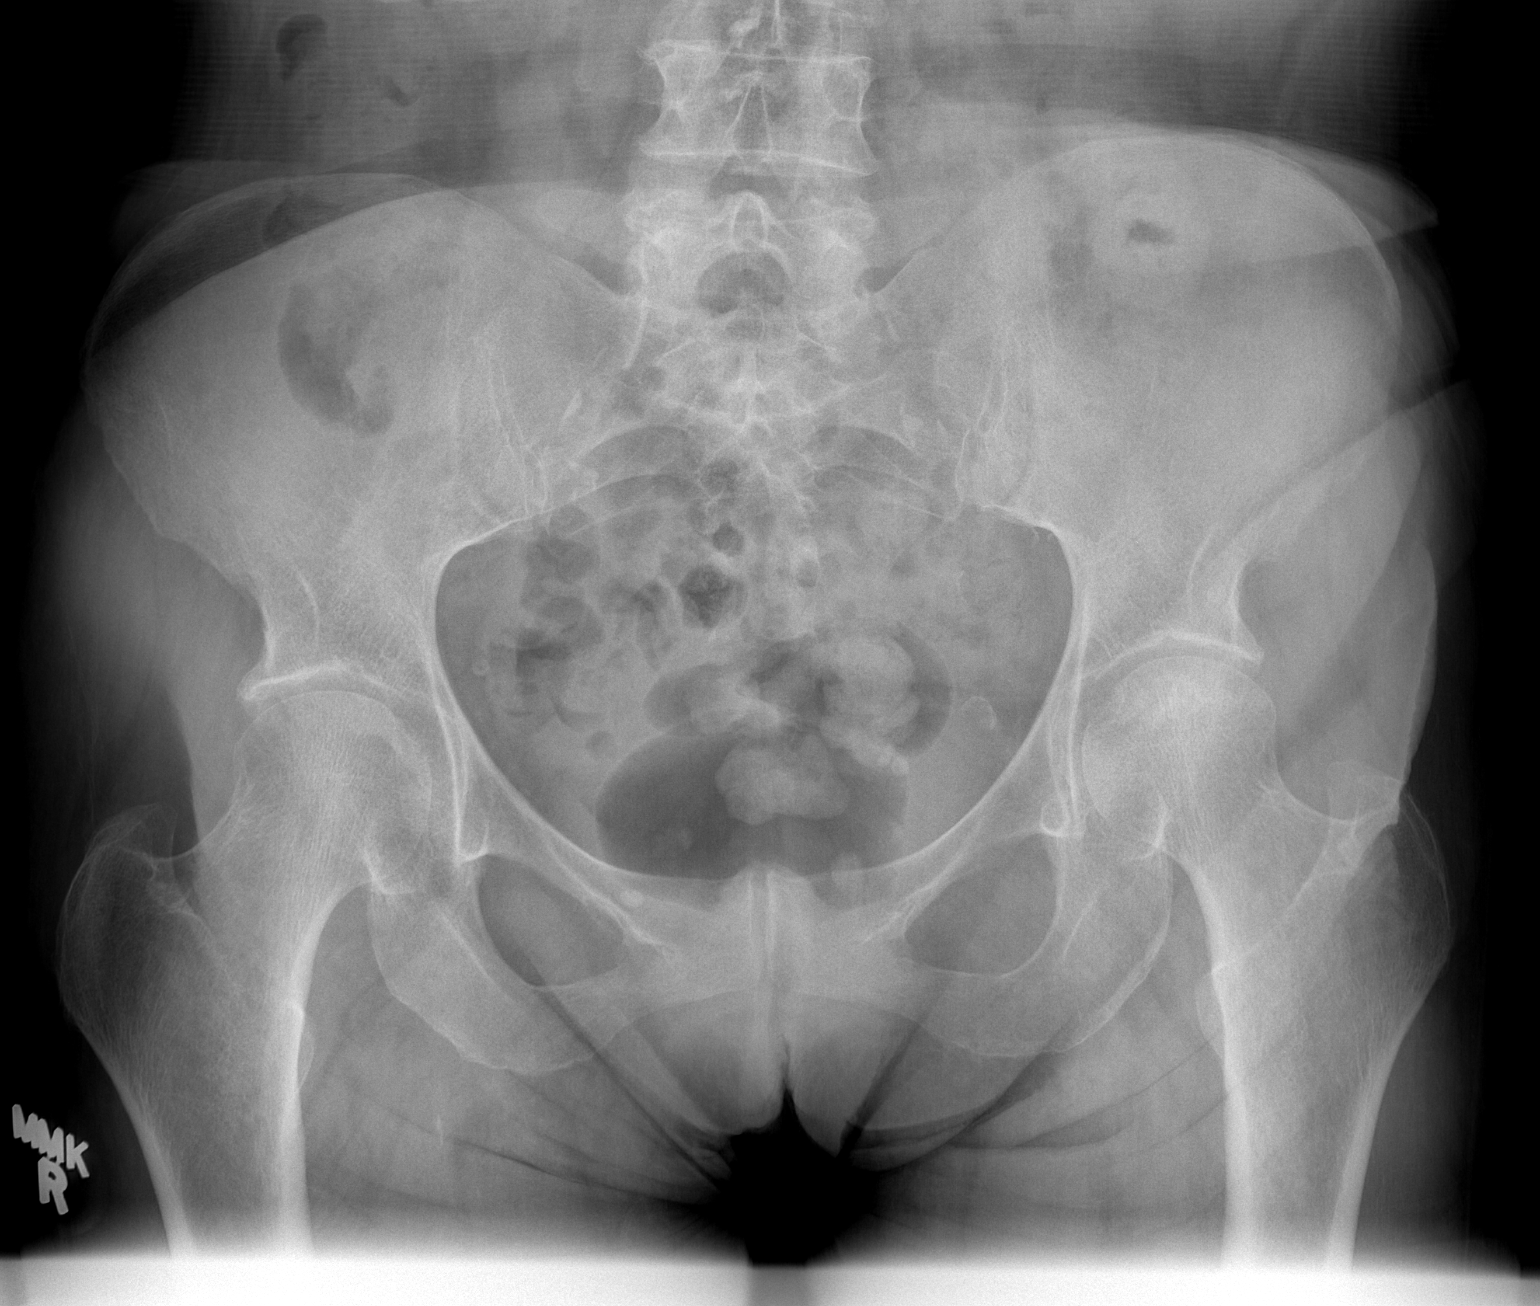

[t hip ap right]
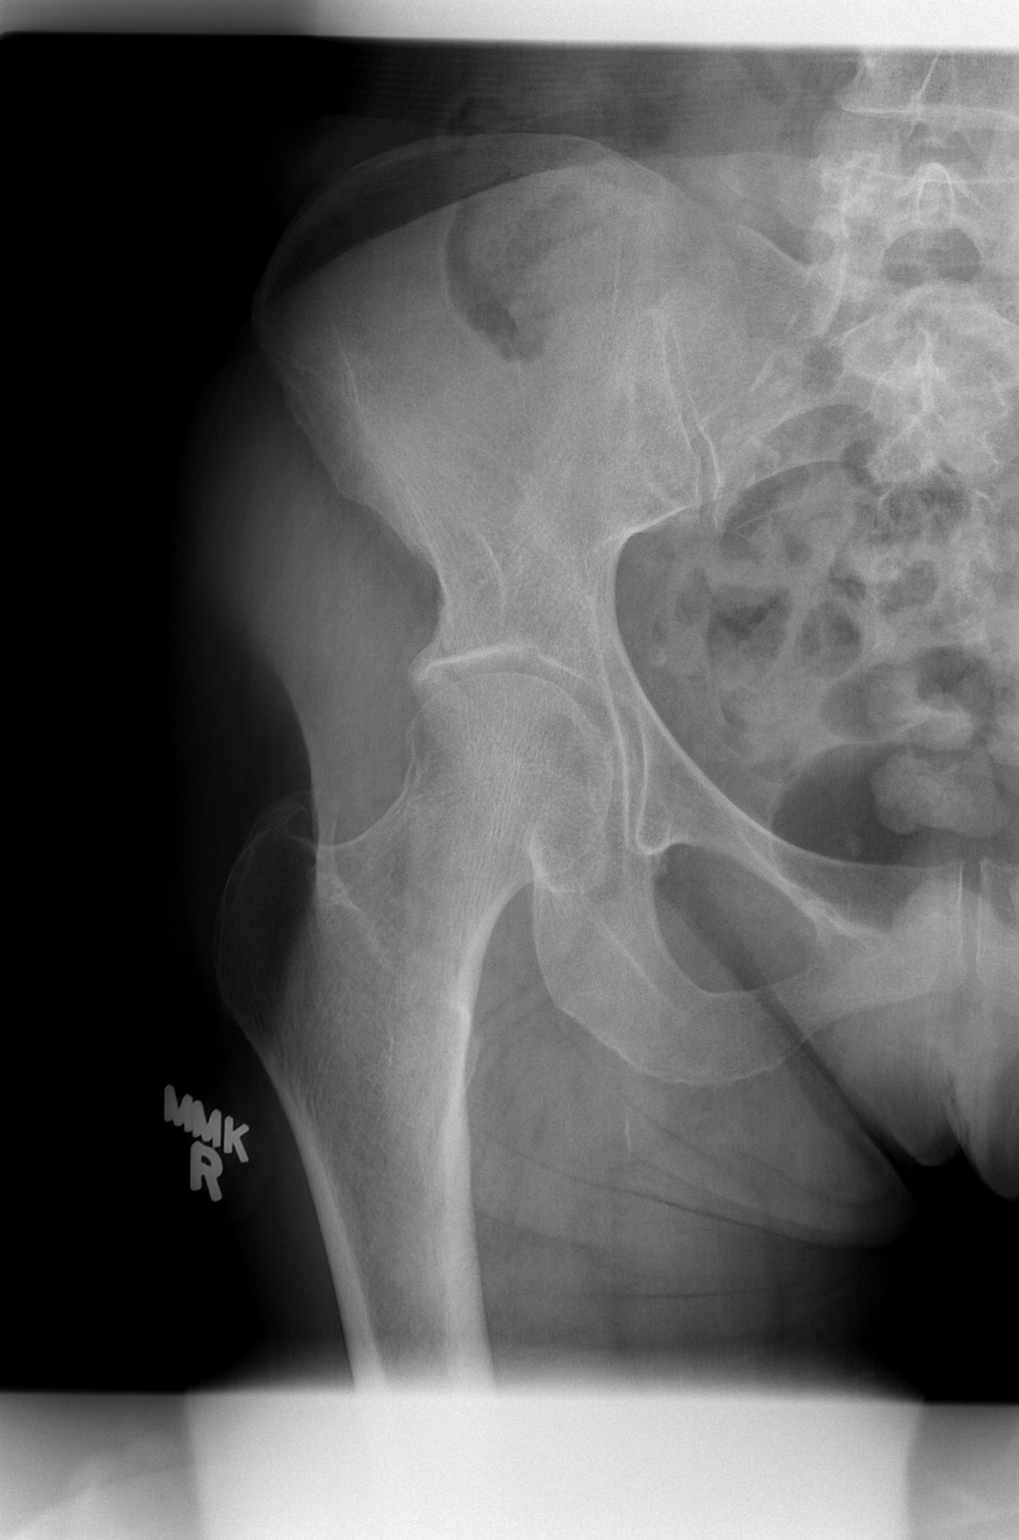

[t hip frog leg right]
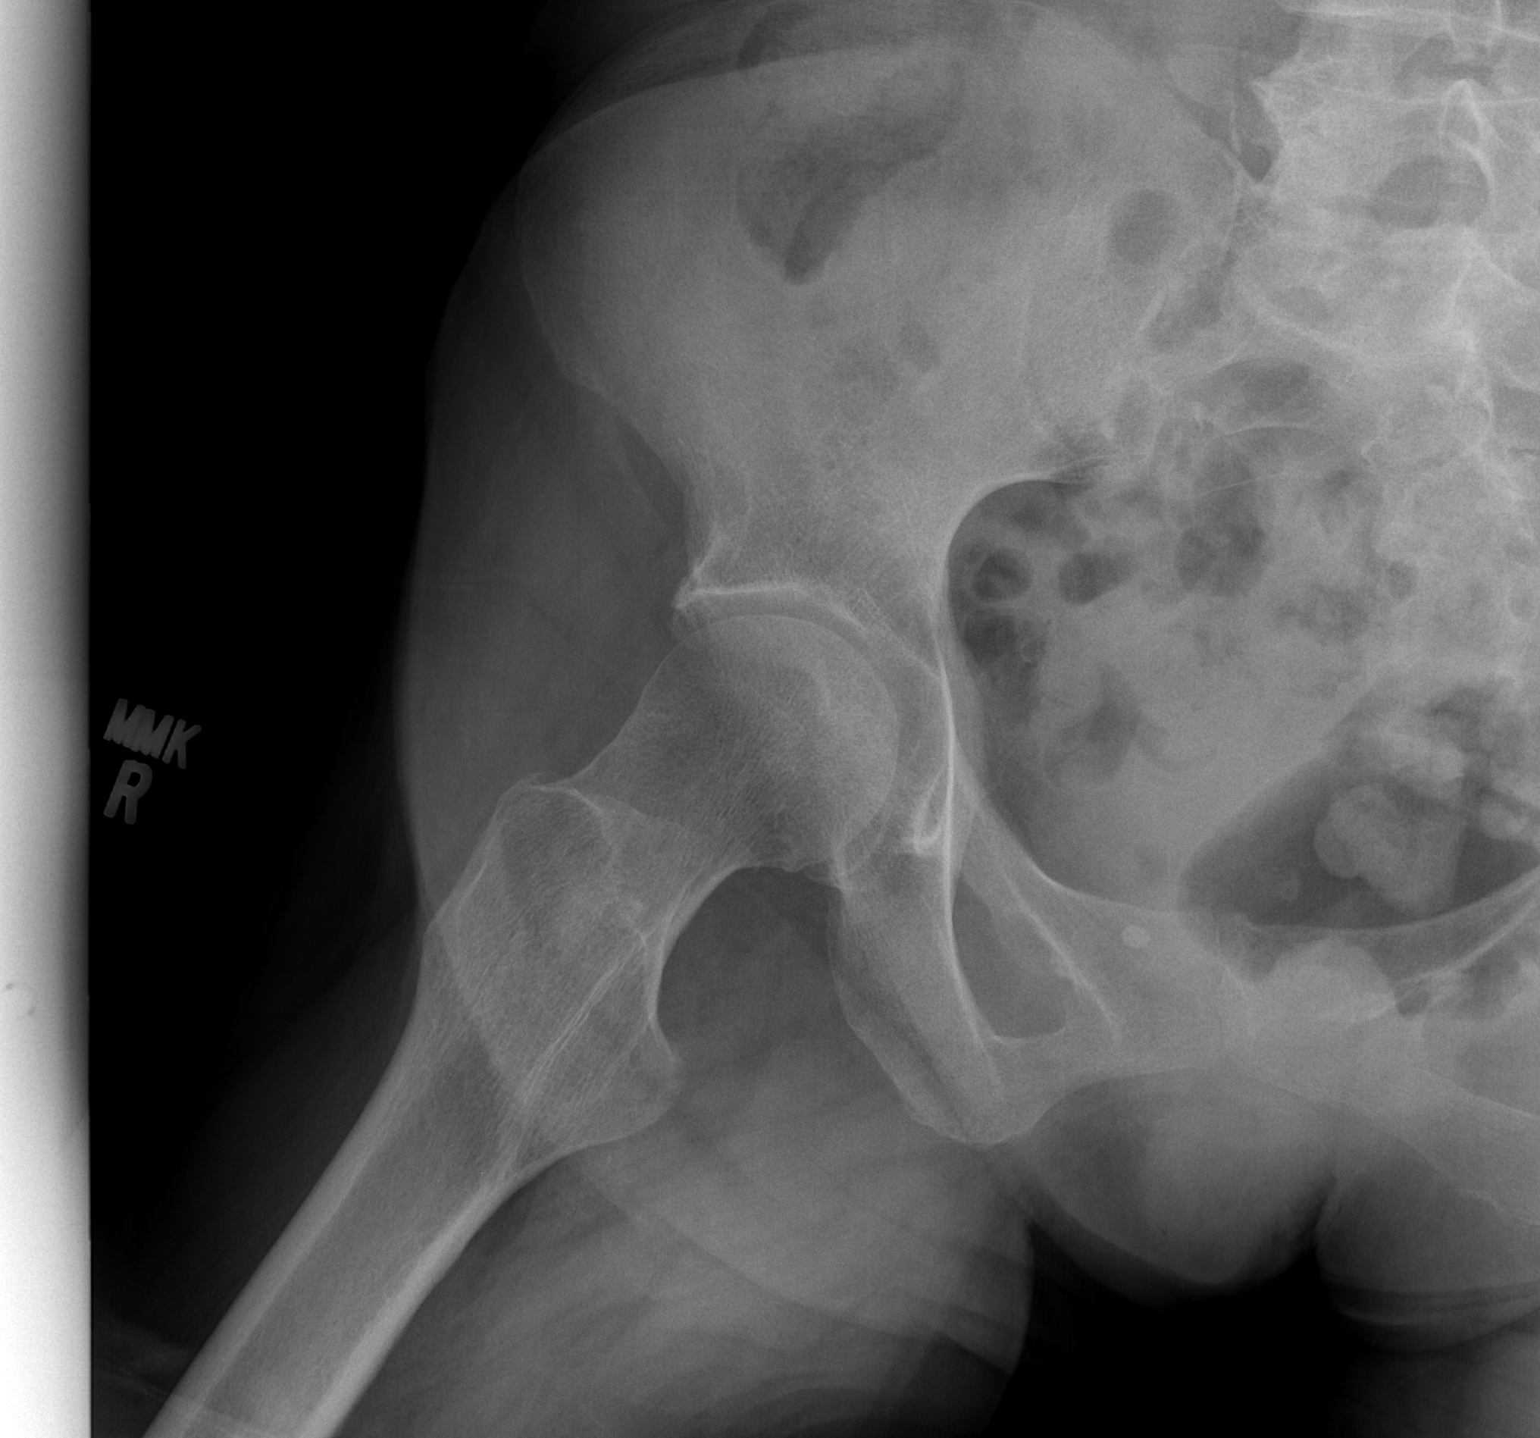

[3 of 3 positions shown; findings below may reference images not displayed]

FINDINGS: The bony pelvis and right hip appear reasonably well mineralized for
age. The right hip joint space is preserved. There is no significant
bony degenerative change. There is no evidence of an acute or old
fracture. The soft tissues of the pelvis exhibit no acute
abnormalities.
IMPRESSION: There is no significant acute or chronic bony abnormality of the
right hip.

## 2013-02-06 MED ORDER — HYDROCODONE-ACETAMINOPHEN 5-325 MG PO TABS
1.0000 | ORAL_TABLET | Freq: Once | ORAL | Status: AC
  Administered 2013-02-06: 1 via ORAL
  Filled 2013-02-06: qty 1

## 2013-02-06 MED ORDER — IBUPROFEN 600 MG PO TABS: 600.0000 mg | ORAL_TABLET | Freq: Three times a day (TID) | ORAL | Status: DC

## 2013-02-06 MED ORDER — HYDROCODONE-ACETAMINOPHEN 5-325 MG PO TABS: 1.0000 | ORAL_TABLET | ORAL | Status: DC | PRN

## 2013-02-06 NOTE — ED Provider Notes (Signed)
CSN: [id number]     Arrival date & time 02/06/13  1330 History   None   This chart was scribed for Kyung BaccaKatie Eliot Bencivenga PA-C, a non-physician practitioner working with No att. providers found by Lewanda RifeAlexandra Hurtado, ED Scribe. This patient was seen in room TR11C/TR11C and the patient's care was started at 3:41 PM     Chief Complaint  Patient presents with  . Leg Pain   (Consider location/radiation/quality/duration/timing/severity/associated sxs/prior Treatment) The history is provided by the patient. No language interpreter was used.   HPI Comments: Tracy Picklesancy Loomis is a 68 y.o. female who presents to the Emergency Department complaining of constant lateral right leg pain onset 3 months. Describes pain as aching and focal. Reports pain is exacerbated by ice, weight bearing on RLE and worsening through out the day. Denies associated recent trauma. Denies urinary or fecal incontinence, urinary retention, perineal/saddle paresthesias, fever, PMHx of cancer, and IV drug use.  Past Medical History  Diagnosis Date  . Neuromuscular disorder   . Depression    Past Surgical History  Procedure Laterality Date  . Brain surgery     History reviewed. No pertinent family history. History  Substance Use Topics  . Smoking status: Current Some Day Smoker -- 0.50 packs/day  . Smokeless tobacco: Not on file  . Alcohol Use: 1.2 oz/week    2 Cans of beer per week   OB History   Grav Para Term Preterm Abortions TAB SAB Ect Mult Living                 Review of Systems A complete 10 system review of systems was obtained and all systems are negative except as noted in the HPI and PMHx.    Allergies  Review of patient's allergies indicates no known allergies.  Home Medications   Current Outpatient Rx  Name  Route  Sig  Dispense  Refill  . acetaminophen (TYLENOL) 500 MG tablet   Oral   Take 500 mg by mouth every 8 (eight) hours as needed for moderate pain.         . Calcium-Vit  D-Arg-Inos-Silicon (BONE DENSITY) 300-200 MG-UNIT TABS   Oral   Take 1 tablet by mouth 2 (two) times daily.   60 tablet   5   . divalproex (DEPAKOTE ER) 500 MG 24 hr tablet   Oral   Take 500 mg by mouth at bedtime.         . lansoprazole (PREVACID) 30 MG capsule   Oral   Take 1 capsule (30 mg total) by mouth daily at 12 noon.   30 capsule   5   . sucralfate (CARAFATE) 1 G tablet   Oral   Take 1 g by mouth 4 (four) times daily -  with meals and at bedtime.          BP 122/74  Pulse 66  Temp(Src) 97.6 F (36.4 C) (Oral)  Resp 22  Ht 5\' 3"  (1.6 m)  Wt 128 lb (58.06 kg)  BMI 22.68 kg/m2  SpO2 99% Physical Exam  Nursing note and vitals reviewed. Constitutional: She is oriented to person, place, and time. She appears well-developed and well-nourished.  HENT:  Head: Normocephalic and atraumatic.  Eyes:  Normal appearance  Neck: Normal range of motion.  Cardiovascular: Normal rate and regular rhythm.   Pulmonary/Chest: Effort normal and breath sounds normal.  Genitourinary:  No CVA ttp  Musculoskeletal:  Lumbar spine, pelvis, greater trochanter and groin non-tender.  Tenderness SI joint, R buttock  and right anterior/posterior thigh.  Pain in right posterior thigh w/ passive flexion and internal/external rotation of hip.  Nml patellar reflexes.  No saddle anesthesia. Distal sensation intact.  2+ DP pulses.    Neurological: She is alert and oriented to person, place, and time.  Skin: Skin is warm and dry. No rash noted.  Psychiatric: She has a normal mood and affect. Her behavior is normal.    ED Course  Procedures COORDINATION OF CARE:  Nursing notes reviewed. Vital signs reviewed. Initial pt interview and examination performed.   3:46 PM-Discussed treatment plan with pt at bedside. Pt agrees with plan.   Treatment plan initiated:Medications - No data to display   Initial diagnostic testing ordered.     Labs Review Labs Reviewed - No data to display Imaging  Review Dg Hip Complete Right  02/06/2013   CLINICAL DATA:  Chronic right hip pain  EXAM: RIGHT HIP - COMPLETE 2+ VIEW  COMPARISON:  None.  FINDINGS: The bony pelvis and right hip appear reasonably well mineralized for age. The right hip joint space is preserved. There is no significant bony degenerative change. There is no evidence of an acute or old fracture. The soft tissues of the pelvis exhibit no acute abnormalities.  IMPRESSION: There is no significant acute or chronic bony abnormality of the right hip.   Electronically Signed   By: David  [place]   On: 02/06/2013 16:38    EKG Interpretation   None       MDM   1. Pain in right hip    67yo F presents w/ 3 months of non-traumatic R hip pain.  No sign of septic arthritis or NV deficits on exam and pt is ambulatory.  Xray unremarkable. Suspect muscle strain, arthritis or less likely lumbar radiculopathy.  Symptomatic treatment w/ vicodin, NSAID, rest and ice.  Pt has a walker at home.  Referred to ortho.  Return precautions discussed.    I personally performed the services described in this documentation, which was scribed in my presence. The recorded information has been reviewed and is accurate.    Otilio Miu, PA-C 02/06/13 2202

## 2013-02-06 NOTE — ED Notes (Signed)
Pt reports pain to right upper leg and hip x 1 month, denies injury. Pt ambulatory at triage. Also having pain to left ring finger. Reports jamming her finger over two months ago and still having pain.

## 2013-02-06 NOTE — Discharge Instructions (Signed)
Take vicodin as prescribed for severe pain.  Do not drive within four hours of taking this medication (may cause drowsiness or confusion).   Take ibuprofen as well; up to 800mg  three times a day with food.  Apply a heating pad or ice pack to sore muscles and try to rest leg if possible.  It may benefit you to get a walker to take some of pressure off of it.  Follow up with the orthopedist if your pain has not started to improve in 5-7 days, or you develop weakness of the injured joint.    You may return to the ER if symptoms worsen or you have any other concerns.

## 2013-02-07 NOTE — ED Provider Notes (Signed)
Medical screening examination/treatment/procedure(s) were performed by non-physician practitioner and as supervising physician I was immediately available for consultation/collaboration.  Saren Corkern, MD 02/07/13 0012 

## 2013-03-12 ENCOUNTER — Ambulatory Visit: Payer: Medicare HMO

## 2013-03-12 ENCOUNTER — Ambulatory Visit (INDEPENDENT_AMBULATORY_CARE_PROVIDER_SITE_OTHER): Payer: Medicare HMO | Admitting: Emergency Medicine

## 2013-03-12 VITALS — BP 132/86 | HR 50 | Temp 98.4°F | Resp 16 | Ht 64.0 in | Wt 120.2 lb

## 2013-03-12 DIAGNOSIS — Z1231 Encounter for screening mammogram for malignant neoplasm of breast: Secondary | ICD-10-CM

## 2013-03-12 DIAGNOSIS — M949 Disorder of cartilage, unspecified: Secondary | ICD-10-CM

## 2013-03-12 DIAGNOSIS — M25551 Pain in right hip: Secondary | ICD-10-CM

## 2013-03-12 DIAGNOSIS — M25559 Pain in unspecified hip: Secondary | ICD-10-CM

## 2013-03-12 DIAGNOSIS — M898X5 Other specified disorders of bone, thigh: Secondary | ICD-10-CM

## 2013-03-12 DIAGNOSIS — M899 Disorder of bone, unspecified: Secondary | ICD-10-CM

## 2013-03-12 IMAGING — CR DG FEMUR 2+V*R*
3 series · 3 of 3 positions shown · non-contrast
Comparison: none

[AP (1 of 2)]
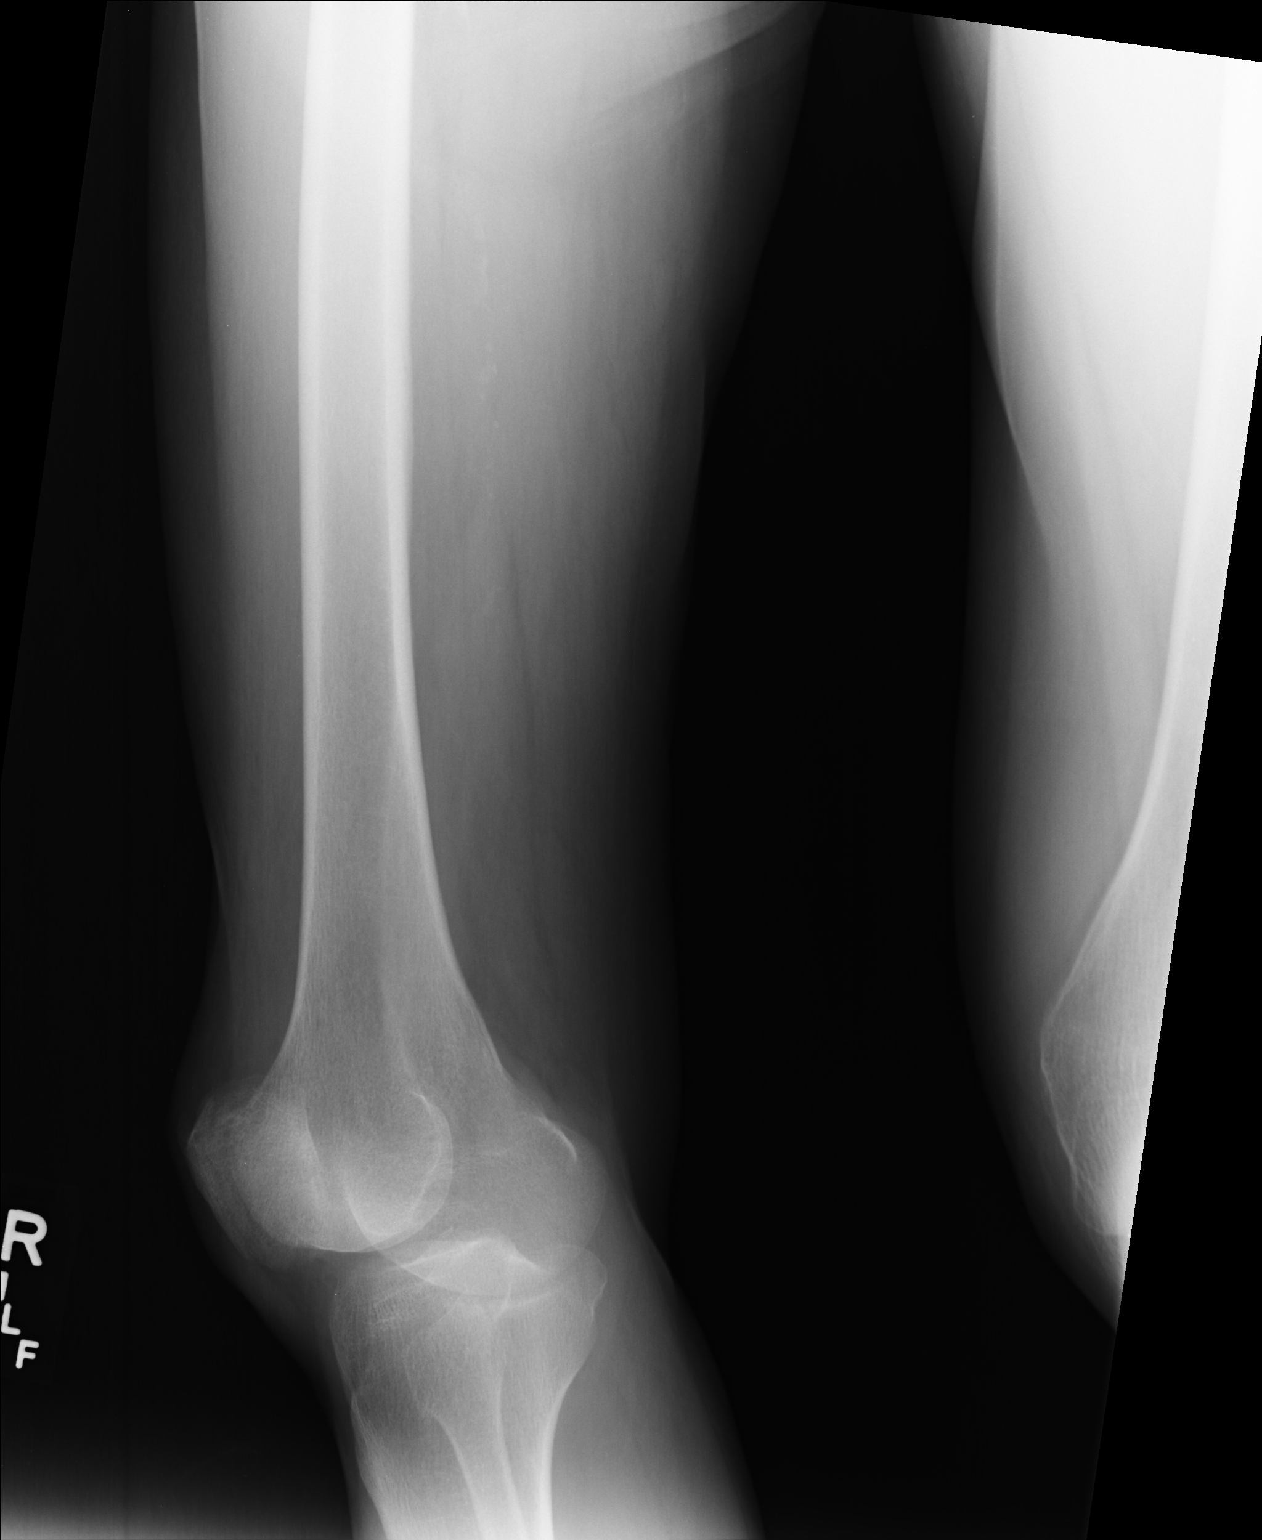

[AP (2 of 2)]
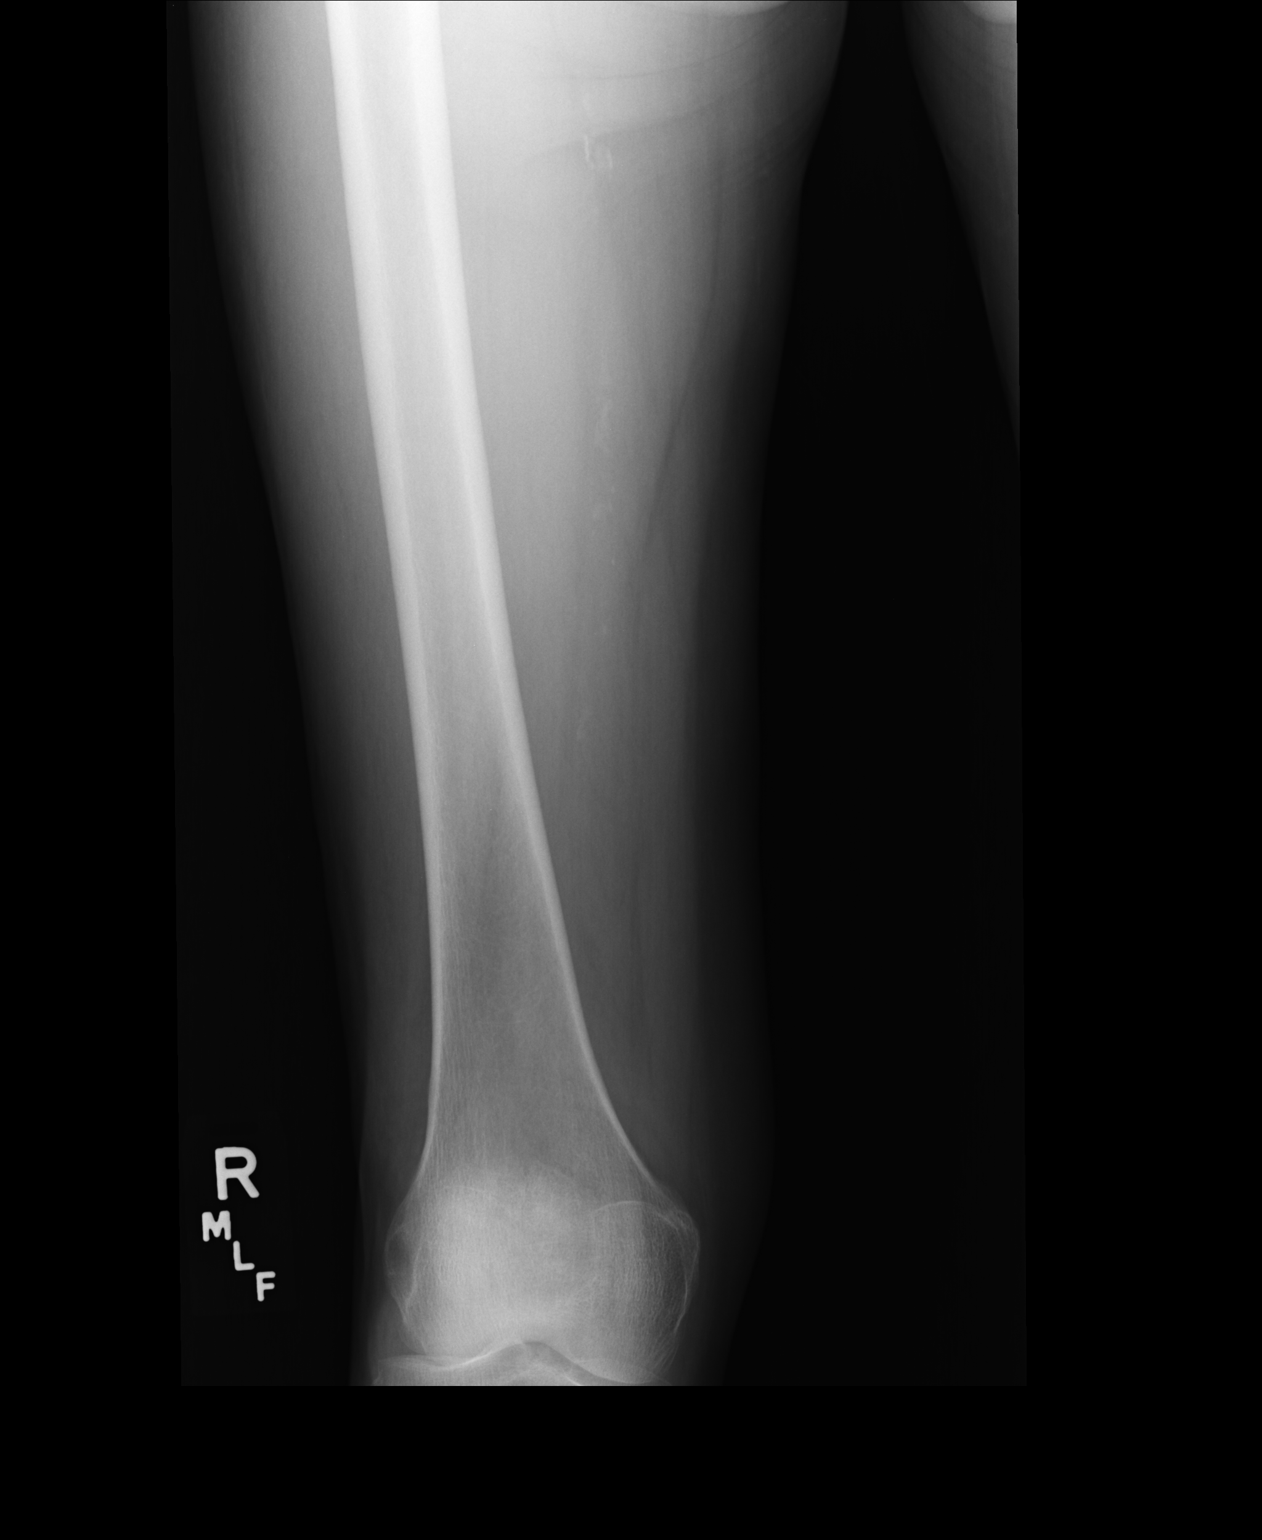

[lateral]
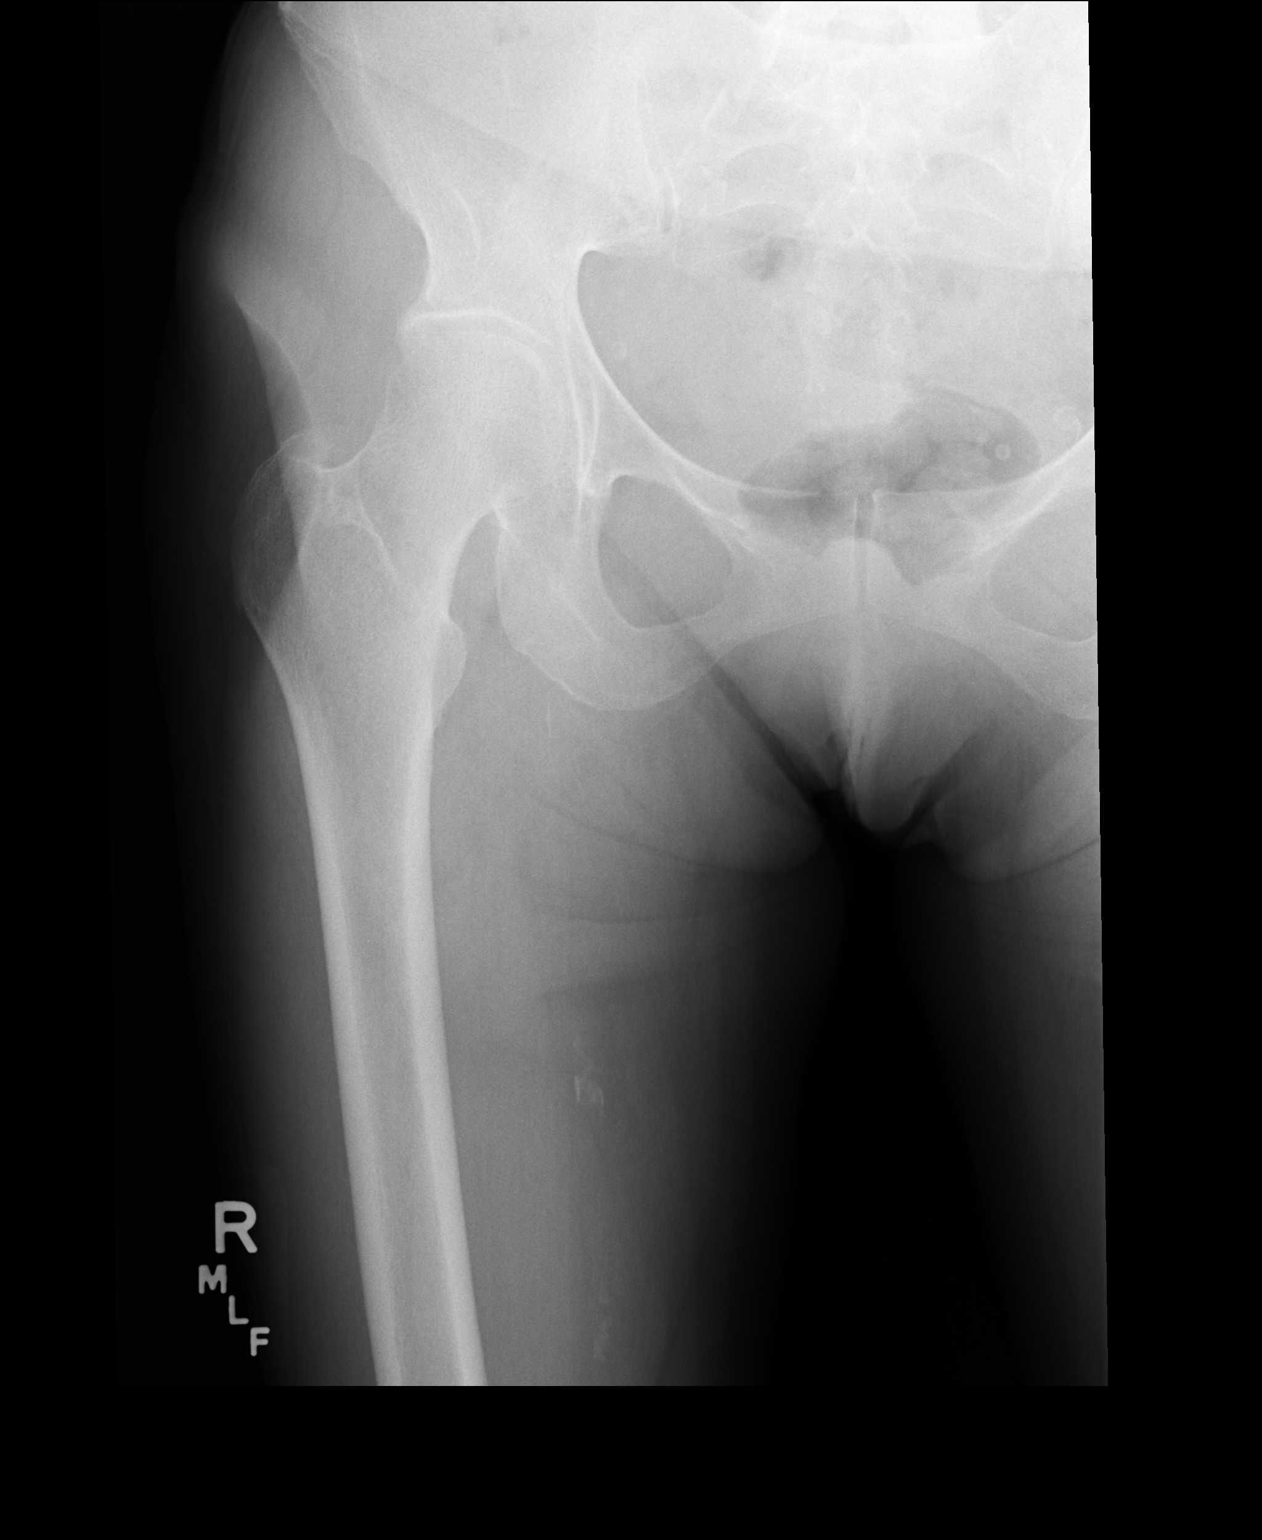

[3 of 3 positions shown; findings below may reference images not displayed]

CLINICAL DATA
Right hip pain

EXAM
RIGHT FEMUR - 2 VIEW

COMPARISON
None.

FINDINGS
No fracture or dislocation is seen.

Possible mild joint space narrowing of the knee.

Visualized soft tissues are notable for vascular calcifications.

IMPRESSION
No acute osseus abnormality is seen.

SIGNATURE

## 2013-03-12 IMAGING — CR DG HIP COMPLETE 2+V*R*
2 series · 2 of 2 positions shown · non-contrast
Comparison: none

[AP]
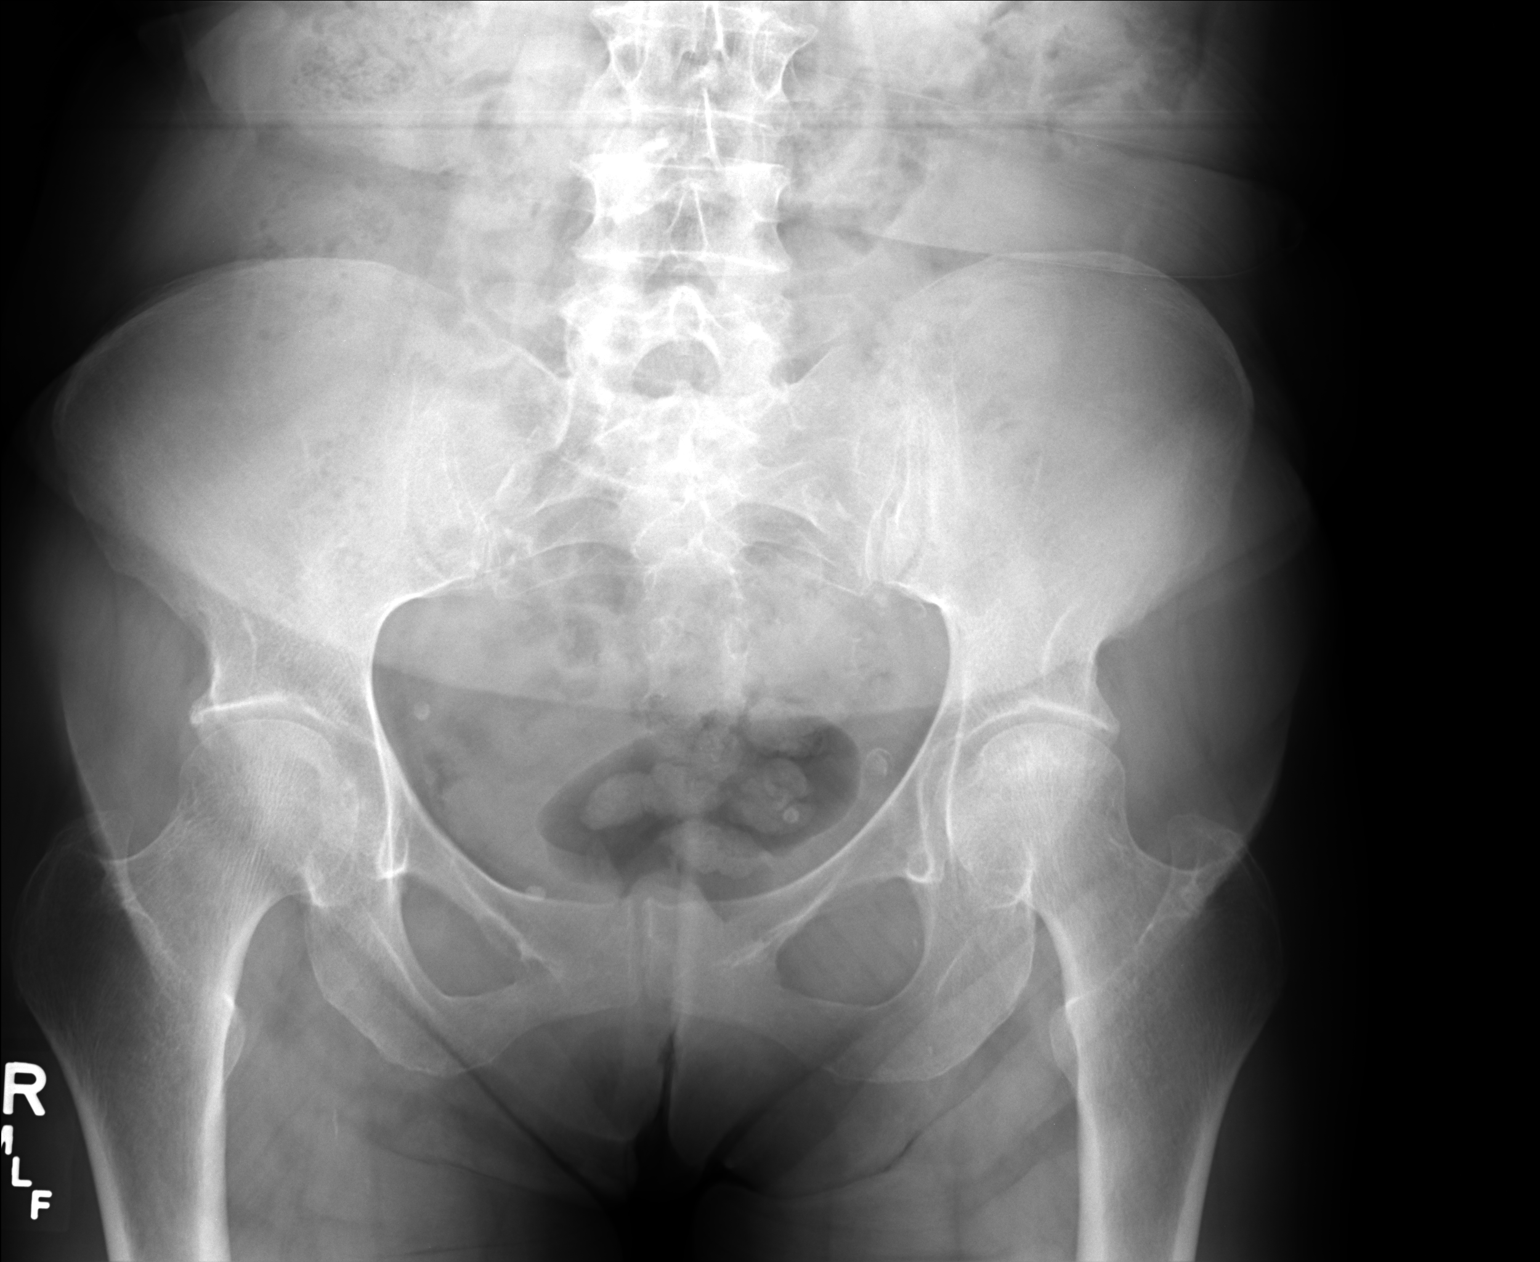

[lateral]
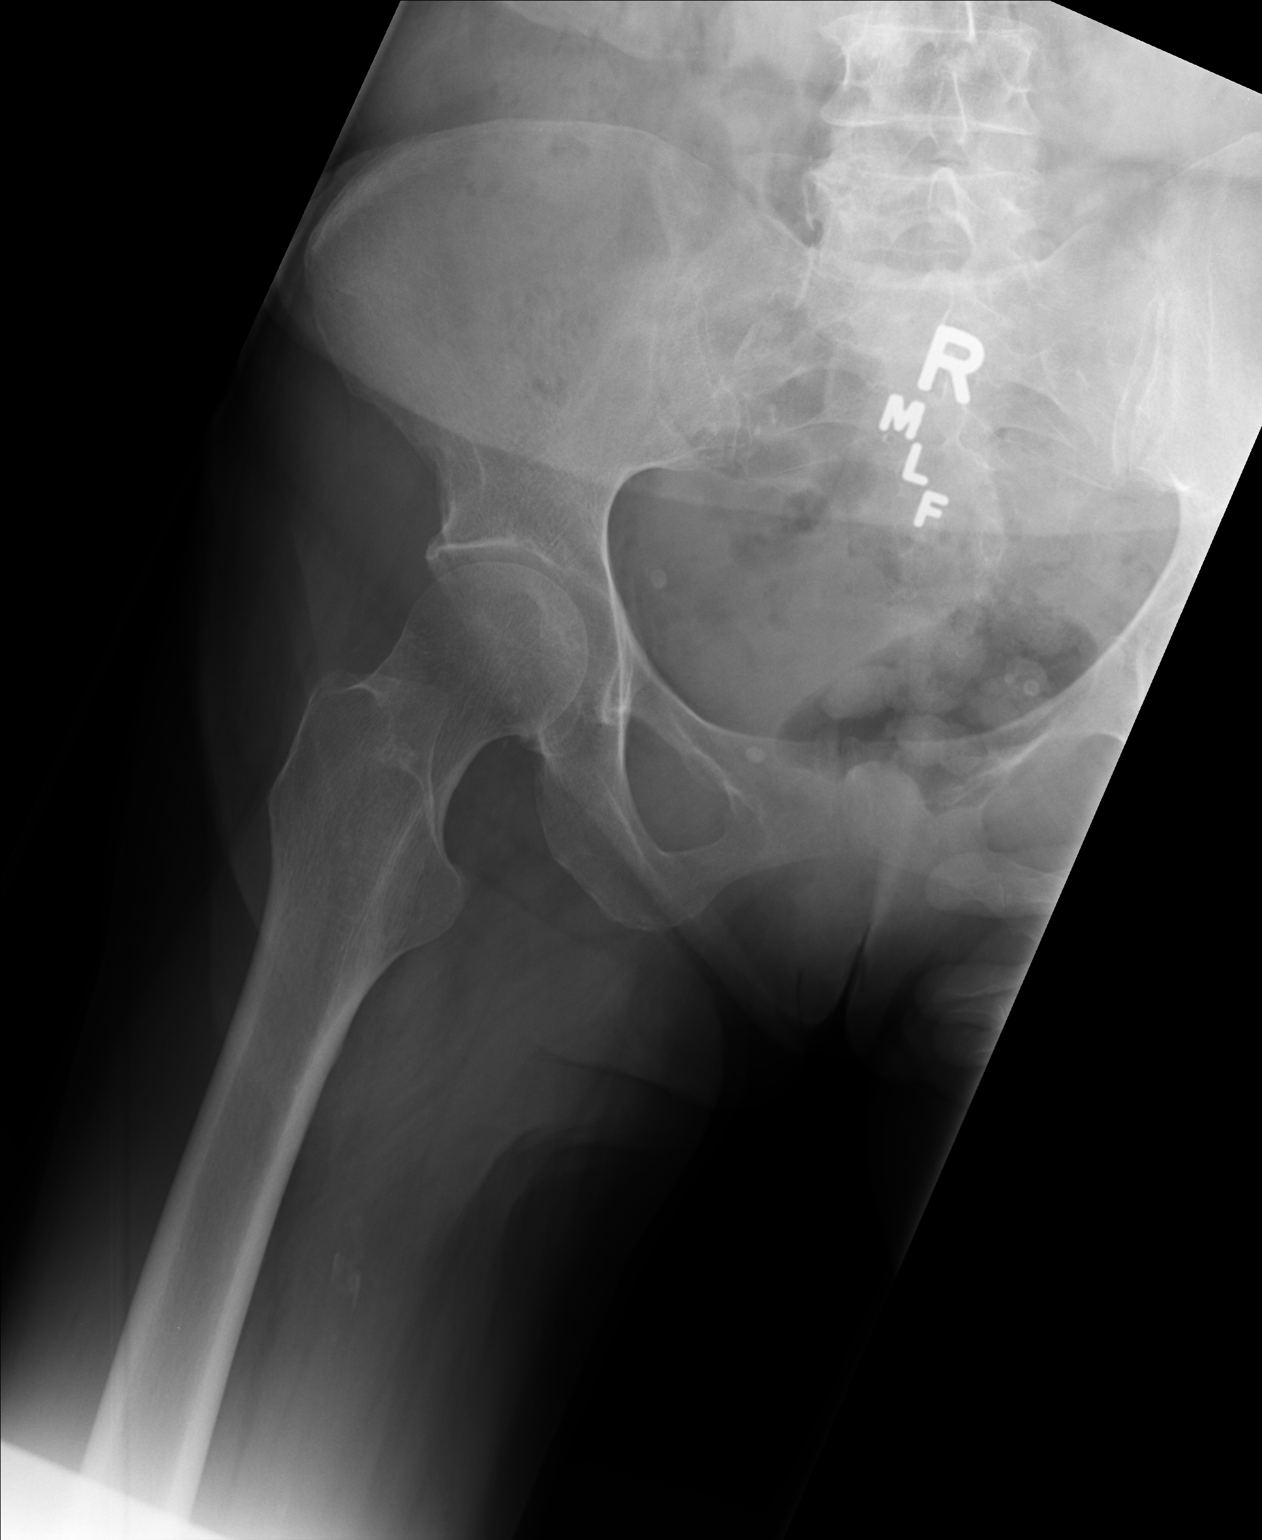

[2 of 2 positions shown; findings below may reference images not displayed]

CLINICAL DATA
Right hip pain x4-5 months

EXAM
RIGHT HIP - COMPLETE 2+ VIEW

COMPARISON
[DATE]

FINDINGS
No fracture or dislocation is seen.

Mild degenerative changes of the right hip.

Visualized bony pelvis appears intact.

Mild degenerative changes of the lower lumbar spine.

Vascular calcifications.

IMPRESSION
Mild degenerative changes.

SIGNATURE

## 2013-03-12 MED ORDER — HYDROCODONE-ACETAMINOPHEN 5-325 MG PO TABS: 1.0000 | ORAL_TABLET | ORAL | Status: DC | PRN

## 2013-03-12 NOTE — Progress Notes (Addendum)
   Subjective:   Patient ID: Tracy Barton, female    DOB: 07/24/1945, 68 y.o.   MRN: [id number] This chart was scribed for Lesle ChrisSteven Melany Wiesman, MD by Marica OtterNusrat Rahman, ED Scribe. This patient was seen in room 1 and the patient's care was started at 12:32PM.    Jovann Luse, Stan HeadSTEVE A, MD  HPI HPI Comments: Tracy Barton is a 68 y.o. 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 Pt states that walking, movement of the right leg, and lying on the right side of the body aggravates the pain. Pt reports that she has not been seen by a physician for her pain prior to this visit. Pt specifies that she was seen here four years ago and she has not seen any other doctors since-- thus, pt has not had a physical nor a mammogram in the past 4 years.   Review of Systems  Respiratory: Negative for chest tightness and shortness of breath.   Cardiovascular: Negative for chest pain, palpitations and leg swelling.  Gastrointestinal: Negative for abdominal pain and blood in stool.  Musculoskeletal: Positive for myalgias (Right Femur and hip pain ).  Neurological: Negative for dizziness, syncope, light-headedness and headaches.   Objective:   Physical Exam  CONSTITUTIONAL: Well developed/well nourished HEAD: Normocephalic/atraumatic EYES: EOMI/PERRL ENMT: Mucous membranes moist NECK: supple no meningeal signs SPINE:entire spine nontender CV: S1/S2 noted, no murmurs/rubs/gallops noted LUNGS: Lungs are clear to auscultation bilaterally, no apparent distress ABDOMEN: soft, nontender, no rebound or guarding GU:no cva tenderness NEURO: Pt is awake/alert, moves all extremitiesx4 EXTREMITIES: pulses normal, full ROM there is tenderness along the hamstring of the right hip. There is limited range of motion to internal and external rotation of the right hip. SKIN:  warm, color normal PSYCH: no abnormalities of mood noted [facility] reading (PRIMARY) by  Dr.Karrington Mccravy there is some soft tissue calcification along the right femur. There is mild degenerative changes involving the right hip there are no other bony abnormalities noted of the pelvis or femur.   Filed Vitals:   03/12/13 1222  BP: 132/86  Pulse: 50  Temp: 98.4 F (36.9 C)  TempSrc: Oral  Resp: 16  Height: 5\' 4"  (1.626 m)  Weight: 120 lb 3.2 oz (54.522 kg)  SpO2: 100%   Assessment & Plan:   we'll treat with Aleve one twice a day patient will be scheduled for a physical examination. She was given hydrocodone for severe pain. Appointment will be made with orthopedics  I personally performed the services described in this documentation, which was scribed in my presence. The recorded information has been reviewed and is accurate.

## 2013-03-20 ENCOUNTER — Telehealth: Payer: Self-pay | Admitting: *Deleted

## 2013-03-20 NOTE — Telephone Encounter (Signed)
Called patient to make aware of need to be seen for physical exam [facility]

## 2013-03-24 ENCOUNTER — Ambulatory Visit (INDEPENDENT_AMBULATORY_CARE_PROVIDER_SITE_OTHER): Payer: Medicare HMO | Admitting: Emergency Medicine

## 2013-03-24 ENCOUNTER — Ambulatory Visit: Payer: Medicare HMO

## 2013-03-24 VITALS — BP 136/68 | HR 63 | Temp 97.5°F | Resp 18 | Ht 63.0 in | Wt 117.0 lb

## 2013-03-24 DIAGNOSIS — M949 Disorder of cartilage, unspecified: Secondary | ICD-10-CM

## 2013-03-24 DIAGNOSIS — K2990 Gastroduodenitis, unspecified, without bleeding: Secondary | ICD-10-CM

## 2013-03-24 DIAGNOSIS — Z0000 Encounter for general adult medical examination without abnormal findings: Secondary | ICD-10-CM

## 2013-03-24 DIAGNOSIS — M899 Disorder of bone, unspecified: Secondary | ICD-10-CM

## 2013-03-24 DIAGNOSIS — R82998 Other abnormal findings in urine: Secondary | ICD-10-CM

## 2013-03-24 DIAGNOSIS — R634 Abnormal weight loss: Secondary | ICD-10-CM | POA: Insufficient documentation

## 2013-03-24 DIAGNOSIS — R1013 Epigastric pain: Secondary | ICD-10-CM

## 2013-03-24 DIAGNOSIS — F17200 Nicotine dependence, unspecified, uncomplicated: Secondary | ICD-10-CM

## 2013-03-24 DIAGNOSIS — E559 Vitamin D deficiency, unspecified: Secondary | ICD-10-CM | POA: Insufficient documentation

## 2013-03-24 DIAGNOSIS — M898X5 Other specified disorders of bone, thigh: Secondary | ICD-10-CM

## 2013-03-24 DIAGNOSIS — Z23 Encounter for immunization: Secondary | ICD-10-CM

## 2013-03-24 DIAGNOSIS — K3189 Other diseases of stomach and duodenum: Secondary | ICD-10-CM

## 2013-03-24 DIAGNOSIS — K2970 Gastritis, unspecified, without bleeding: Secondary | ICD-10-CM | POA: Insufficient documentation

## 2013-03-24 DIAGNOSIS — R8281 Pyuria: Secondary | ICD-10-CM

## 2013-03-24 LAB — POCT UA - MICROSCOPIC ONLY
Casts, Ur, LPF, POC: NEGATIVE
Crystals, Ur, HPF, POC: NEGATIVE
Yeast, UA: NEGATIVE

## 2013-03-24 LAB — POCT URINALYSIS DIPSTICK
Bilirubin, UA: NEGATIVE
Glucose, UA: NEGATIVE
Ketones, UA: NEGATIVE
Nitrite, UA: NEGATIVE
Protein, UA: NEGATIVE
Spec Grav, UA: 1.02
Urobilinogen, UA: 0.2
pH, UA: 5.5

## 2013-03-24 LAB — POCT CBC
Granulocyte percent: 57.3 %G (ref 37–80)
HCT, POC: 42.9 % (ref 37.7–47.9)
Hemoglobin: 14 g/dL (ref 12.2–16.2)
Lymph, poc: 2.7 (ref 0.6–3.4)
MCH, POC: 28.9 pg (ref 27–31.2)
MCHC: 32.6 g/dL (ref 31.8–35.4)
MCV: 88.7 fL (ref 80–97)
MID (cbc): 0.4 (ref 0–0.9)
MPV: 9.1 fL (ref 0–99.8)
POC Granulocyte: 4.2 (ref 2–6.9)
POC LYMPH PERCENT: 36.8 %L (ref 10–50)
POC MID %: 5.9 %M (ref 0–12)
Platelet Count, POC: 297 10*3/uL (ref 142–424)
RBC: 4.84 M/uL (ref 4.04–5.48)
RDW, POC: 13.7 %
WBC: 7.3 10*3/uL (ref 4.6–10.2)

## 2013-03-24 LAB — IFOBT (OCCULT BLOOD): IFOBT: NEGATIVE

## 2013-03-24 IMAGING — CR DG CHEST 2V
2 series · 2 of 2 positions shown · non-contrast
Comparison: [DATE].

CLINICAL DATA: Smoker.

EXAM:
CHEST  2 VIEW

[PA]
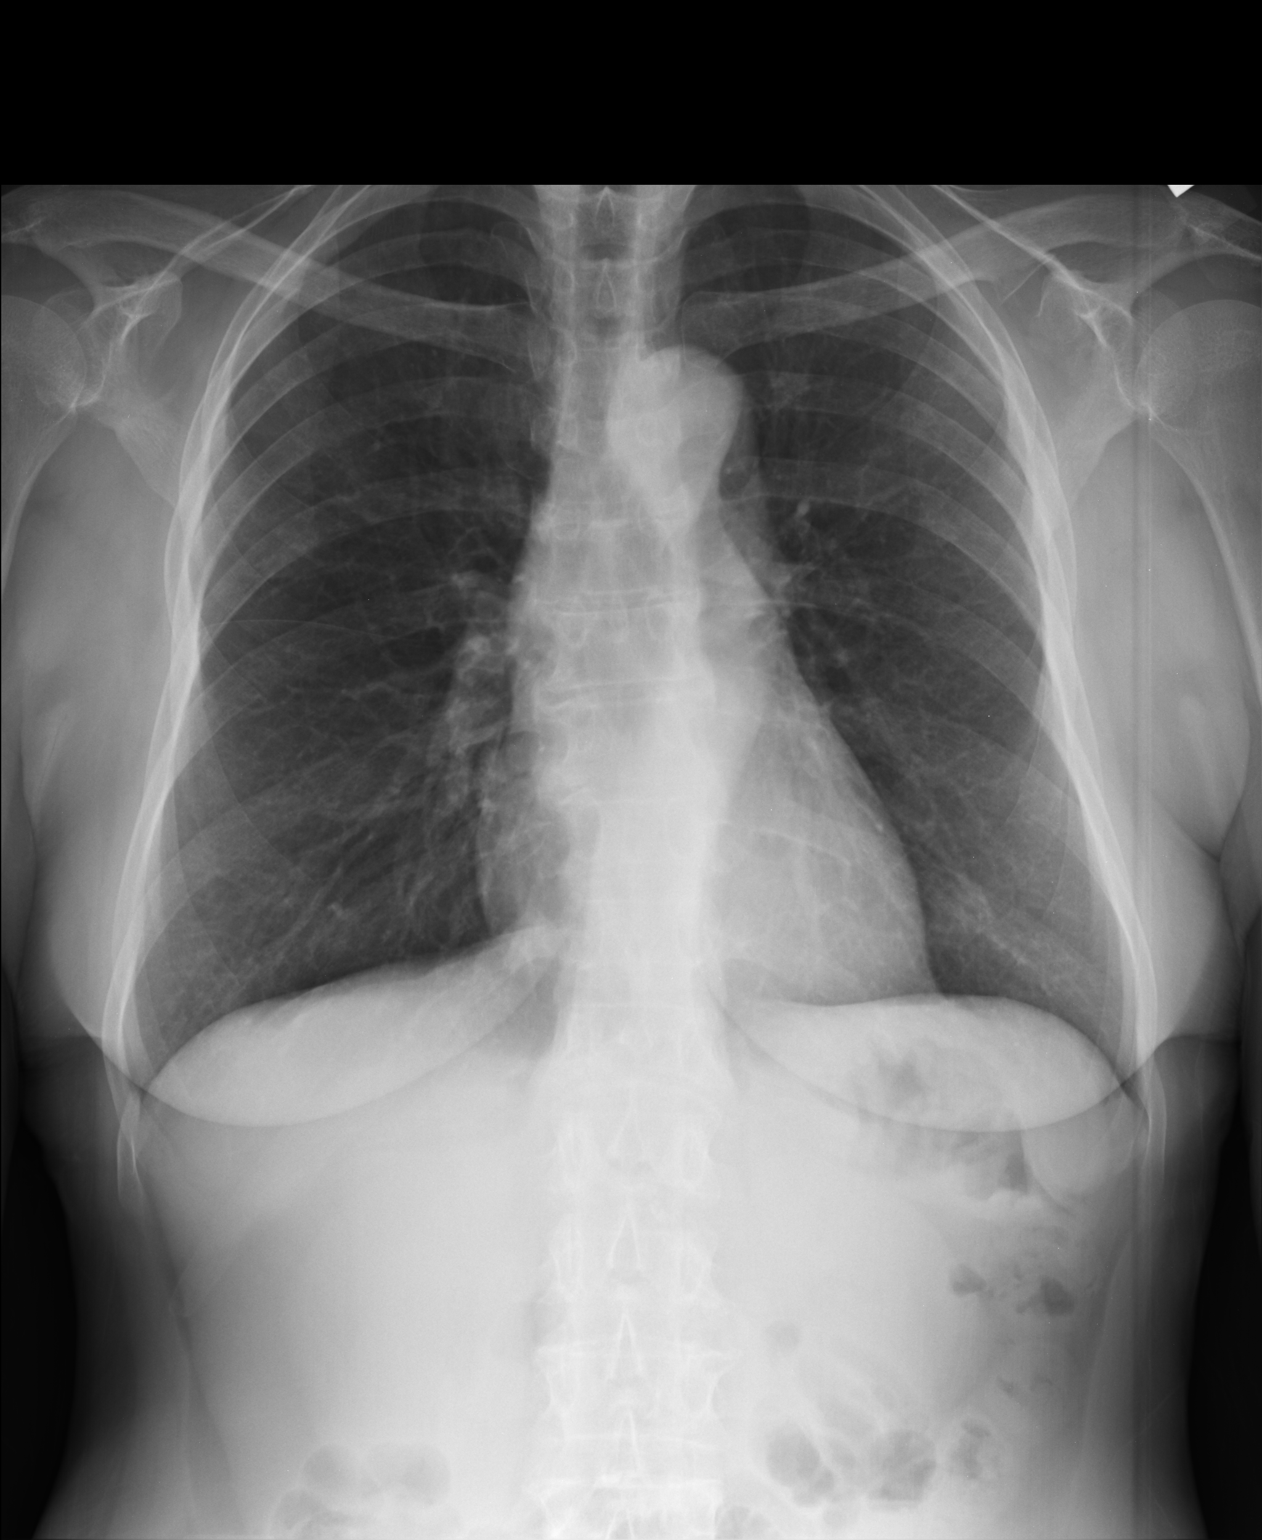

[lateral]
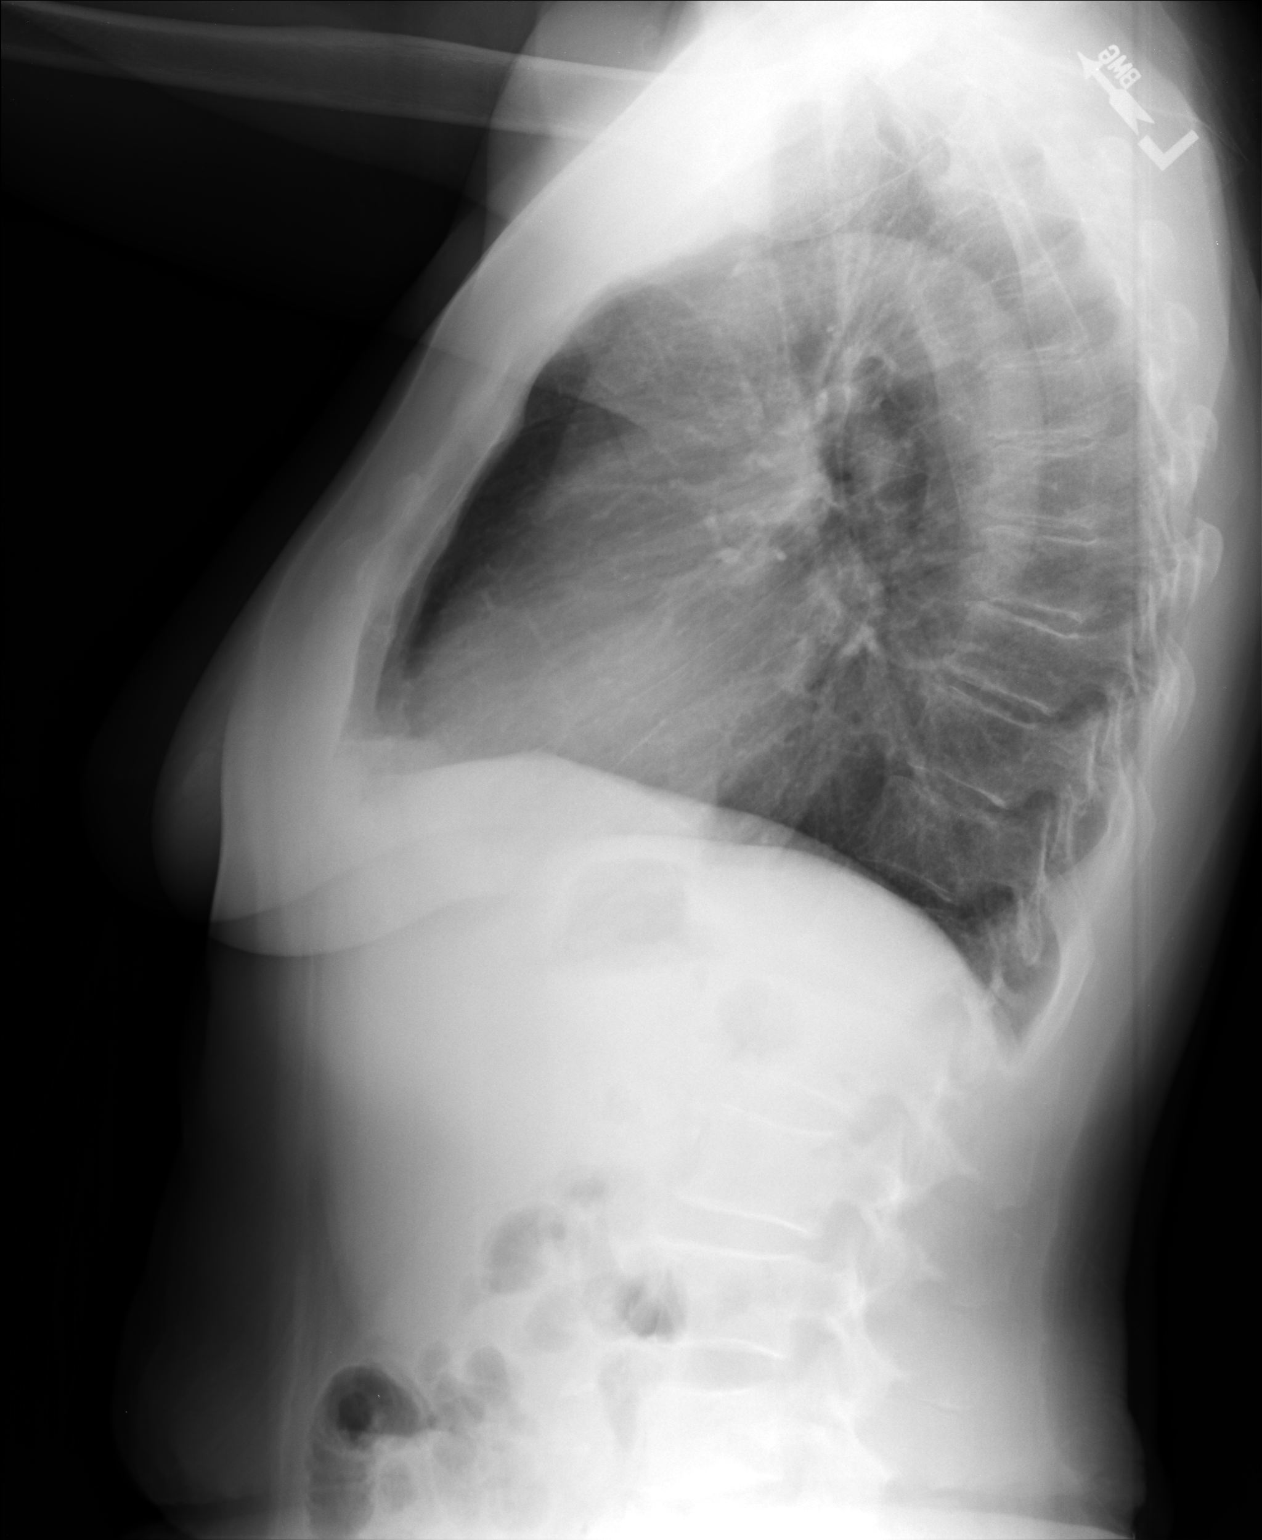

[2 of 2 positions shown; findings below may reference images not displayed]

FINDINGS: The cardiac silhouette, mediastinal and hilar contours are within
normal limits and stable. There is mild tortuosity of the thoracic
aorta. The lungs are clear. No pleural effusion. The bony thorax is
intact.
IMPRESSION: No acute cardiopulmonary findings.

## 2013-03-24 MED ORDER — ZOSTER VACCINE LIVE 19400 UNT/0.65ML ~~LOC~~ SOLR: 0.6500 mL | Freq: Once | SUBCUTANEOUS | Status: DC

## 2013-03-24 MED ORDER — LANSOPRAZOLE 30 MG PO CPDR: 30.0000 mg | DELAYED_RELEASE_CAPSULE | Freq: Every day | ORAL | Status: DC

## 2013-03-24 MED ORDER — SUCRALFATE 1 G PO TABS: 1.0000 g | ORAL_TABLET | Freq: Three times a day (TID) | ORAL | Status: DC

## 2013-03-24 NOTE — Progress Notes (Addendum)
Subjective:    Patient ID: Tracy Barton, female    DOB: 1945-03-30, 68 y.o.   MRN: [id number]  HPI Scribed for Lesle Chris MD, the patient was seen in room 5. This chart was scribed by Tracy Barton, ED scribe. Patient's care was started at 1:31 PM  HPI Comments: Hx was provided by the pt.  Tracy Barton is a 68 y.o. female who presents to the [facility] for a complete physical exam. Reports appetite is "fluctuating" and weighs 117lbs. Denies associated chest pain, and shortness of breath. States most recent Mammogram and coloscopy was 3 years ago. States last visit to optometrist was last year. Denies having shingles and pneumonia vaccine. States tetanus status is not up to date. Reports PMHx of acid reflux. States she has not had an upper endoscopy for her acid reflux. States she is a social drinker. States she smokes cigarettes daily.   States she has a mammogram scheduled for next week.  Denies PMHx of hepatitis, CAD, and pulmonary issues.   Past Medical History  Diagnosis Date  . Neuromuscular disorder   . Depression   . Blood transfusion without reported diagnosis     Past Surgical History  Procedure Laterality Date  . Brain surgery    . Tubal ligation      Family History  Problem Relation Age of Onset  . Stroke Mother   . Cancer Mother   . Cancer Brother   . Mental illness Maternal Grandmother     History   Social History  . Marital Status: Divorced    Spouse Name: N/A    Number of Children: N/A  . Years of Education: N/A   Occupational History  . Not on file.   Social History Main Topics  . Smoking status: Current Some Day Smoker -- 0.50 packs/day  . Smokeless tobacco: Not on file  . Alcohol Use: 1.2 oz/week    2 Cans of beer per week  . Drug Use: No  . Sexual Activity: Not on file   Other Topics Concern  . Not on file   Social History Narrative  . No narrative on file    No Known Allergies  There are no active  problems to display for this patient.   Filed Vitals:   03/24/13 1307  BP: 136/68  Pulse: 63  Temp: 97.5 F (36.4 C)  TempSrc: Oral  Resp: 18  Height: 5\' 3"  (1.6 m)  Weight: 117 lb (53.071 kg)  SpO2: 100%         Review of Systems A complete 10 system review of systems was obtained and all systems are negative except as noted in the HPI and PMHx.       Objective:   Physical Exam Physical Exam  Vitals reviewed. Constitutional: She is oriented to person, place, and time. She appears well-developed and well-nourished. No distress.  HENT:  Head: Normocephalic and atraumatic.  Throat: Oropharynx is clear and moist. Mucous membranes normal. Uvula is midline.  Eyes: EOM are normal.  Neck: Neck supple. No tracheal deviation present.  Cardiovascular: Normal rate. Irregular heart rhythm.  Pulmonary/Chest: Effort normal. No respiratory distress. Breast exam normal. No masses palpated. Musculoskeletal: Normal range of motion.  Neurological: She is alert and oriented to person, place, and time.  Skin: Skin is warm and dry.  Psychiatric: She has a normal mood and affect. Her behavior is normal.   No orders of the defined types were placed in this encounter.   EKG  sinus bradycardia [facility] reading (PRIMARY) by  Dr. Cleta Albertsaub no acute disease. Increase AP diameter  Results for orders placed in visit on 03/24/13  POCT CBC      Result Value Ref Range   WBC 7.3  4.6 - 10.2 K/uL   Lymph, poc 2.7  0.6 - 3.4   POC LYMPH PERCENT 36.8  10 - 50 %L   MID (cbc) 0.4  0 - 0.9   POC MID % 5.9  0 - 12 %M   POC Granulocyte 4.2  2 - 6.9   Granulocyte percent 57.3  37 - 80 %G   RBC 4.84  4.04 - 5.48 M/uL   Hemoglobin 14.0  12.2 - 16.2 g/dL   HCT, POC 16.142.9  09.637.7 - 47.9 %   MCV 88.7  80 - 97 fL   MCH, POC 28.9  27 - 31.2 pg   MCHC 32.6  31.8 - 35.4 g/dL   RDW, POC 04.513.7     Platelet Count, POC 297  142 - 424 K/uL   MPV 9.1  0 - 99.8 fL  POCT URINALYSIS DIPSTICK      Result Value Ref Range    Color, UA yellow     Clarity, UA clear     Glucose, UA neg     Bilirubin, UA neg     Ketones, UA neg     Spec Grav, UA 1.020     Blood, UA trace-lysed     pH, UA 5.5     Protein, UA neg     Urobilinogen, UA 0.2     Nitrite, UA neg     Leukocytes, UA small (1+)    POCT UA - MICROSCOPIC ONLY      Result Value Ref Range   WBC, Ur, HPF, POC 4-7     RBC, urine, microscopic 1-4     Bacteria, U Microscopic 1+     Mucus, UA small     Epithelial cells, urine per micros 3-8     Crystals, Ur, HPF, POC neg     Casts, Ur, LPF, POC neg     Yeast, UA neg    IFOBT (OCCULT BLOOD)      Result Value Ref Range   IFOBT Negative         Assessment & Plan:  I personally performed the services described in this documentation, which was scribed in my presence. The recorded information has been reviewed and is accurate.  Routine labs were done. She is to focus on weight gain. We'll check the patient back in 6 weeks. Awaiting the results of her blood work. I did refill her Prevacid to take for her stomach. I also refilled her Carafate.

## 2013-03-25 LAB — URINE CULTURE
Colony Count: NO GROWTH
Organism ID, Bacteria: NO GROWTH

## 2013-03-25 LAB — TSH: TSH: 1.603 u[IU]/mL (ref 0.350–4.500)

## 2013-03-25 LAB — COMPREHENSIVE METABOLIC PANEL
ALT: 12 U/L (ref 0–35)
AST: 18 U/L (ref 0–37)
Albumin: 4.4 g/dL (ref 3.5–5.2)
Alkaline Phosphatase: 70 U/L (ref 39–117)
BUN: 16 mg/dL (ref 6–23)
CO2: 28 mEq/L (ref 19–32)
Calcium: 10 mg/dL (ref 8.4–10.5)
Chloride: 103 mEq/L (ref 96–112)
Creat: 0.9 mg/dL (ref 0.50–1.10)
Glucose, Bld: 86 mg/dL (ref 70–99)
Potassium: 4.1 mEq/L (ref 3.5–5.3)
Sodium: 138 mEq/L (ref 135–145)
Total Bilirubin: 0.4 mg/dL (ref 0.2–1.2)
Total Protein: 6.7 g/dL (ref 6.0–8.3)

## 2013-03-25 LAB — VITAMIN D 25 HYDROXY (VIT D DEFICIENCY, FRACTURES): Vit D, 25-Hydroxy: 23 ng/mL — ABNORMAL LOW (ref 30–89)

## 2013-03-26 NOTE — Progress Notes (Signed)
LEFT A MESSAGE FOR PATIENT TO RETURN CALL TO SCHEDULE APPOINTMENT FOR CPE

## 2013-03-27 ENCOUNTER — Ambulatory Visit: Payer: Medicare Other

## 2013-03-28 NOTE — Progress Notes (Signed)
Left a message for patient to return call to schedule CPE  

## 2013-05-12 ENCOUNTER — Encounter (HOSPITAL_COMMUNITY): Payer: Self-pay | Admitting: Emergency Medicine

## 2013-05-12 ENCOUNTER — Emergency Department (HOSPITAL_COMMUNITY)
Admission: EM | Admit: 2013-05-12 | Discharge: 2013-05-12 | Disposition: A | Discharge status: Home / Self Care | Payer: Medicare Other | Attending: Emergency Medicine | Admitting: Emergency Medicine

## 2013-05-12 DIAGNOSIS — Z8669 Personal history of other diseases of the nervous system and sense organs: Secondary | ICD-10-CM | POA: Insufficient documentation

## 2013-05-12 DIAGNOSIS — F329 Major depressive disorder, single episode, unspecified: Secondary | ICD-10-CM | POA: Insufficient documentation

## 2013-05-12 DIAGNOSIS — F3289 Other specified depressive episodes: Secondary | ICD-10-CM | POA: Insufficient documentation

## 2013-05-12 DIAGNOSIS — F17200 Nicotine dependence, unspecified, uncomplicated: Secondary | ICD-10-CM | POA: Insufficient documentation

## 2013-05-12 DIAGNOSIS — Z79899 Other long term (current) drug therapy: Secondary | ICD-10-CM | POA: Insufficient documentation

## 2013-05-12 DIAGNOSIS — M549 Dorsalgia, unspecified: Secondary | ICD-10-CM

## 2013-05-12 DIAGNOSIS — M5430 Sciatica, unspecified side: Secondary | ICD-10-CM | POA: Insufficient documentation

## 2013-05-12 DIAGNOSIS — M25559 Pain in unspecified hip: Secondary | ICD-10-CM | POA: Insufficient documentation

## 2013-05-12 MED ORDER — OXYCODONE-ACETAMINOPHEN 5-325 MG PO TABS: 1.0000 | ORAL_TABLET | Freq: Four times a day (QID) | ORAL | Status: DC | PRN

## 2013-05-12 NOTE — Discharge Instructions (Signed)
Please follow up with your primary care physician in 1-2 days. If you do not have one please call the [facility] number listed above. Please follow up with your orthopedist to schedule a follow up appointment. Please follow up with [facility] [phone]  to schedule a follow up appointment. Please take pain medication and/or muscle relaxants as prescribed and as needed for pain. Please do not drive on narcotic pain medication or on muscle relaxants. Please read all discharge instructions and return precautions.   Back Pain, Adult Low back pain is very common. About 1 in 5 people have back pain.The cause of low back pain is rarely dangerous. The pain often gets better over time.About half of people with a sudden onset of back pain feel better in just 2 weeks. About 8 in 10 people feel better by 6 weeks.  CAUSES Some common causes of back pain include:  Strain of the muscles or ligaments supporting the spine.  Wear and tear (degeneration) of the spinal discs.  Arthritis.  Direct injury to the back. DIAGNOSIS Most of the time, the direct cause of low back pain is not known.However, back pain can be treated effectively even when the exact cause of the pain is unknown.Answering your caregiver's questions about your overall health and symptoms is one of the most accurate ways to make sure the cause of your pain is not dangerous. If your caregiver needs more information, he or she may order lab work or imaging tests (X-rays or MRIs).However, even if imaging tests show changes in your back, this usually does not require surgery. HOME CARE INSTRUCTIONS For many people, back pain returns.Since low back pain is rarely dangerous, it is often a condition that people can learn to [facility] their own.   Remain active. It is stressful on the back to sit or stand in one place. Do not sit, drive, or stand in one place for more than 30 minutes  at a time. Take short walks on level surfaces as soon as pain allows.Try to increase the length of time you walk each day.  Do not stay in bed.Resting more than 1 or 2 days can delay your recovery.  Do not avoid exercise or work.Your body is made to move.It is not dangerous to be active, even though your back may hurt.Your back will likely heal faster if you return to being active before your pain is gone.  Pay attention to your body when you bend and lift. Many people have less discomfortwhen lifting if they bend their knees, keep the load close to their bodies,and avoid twisting. Often, the most comfortable positions are those that put less stress on your recovering back.  Find a comfortable position to sleep. Use a firm mattress and lie on your side with your knees slightly bent. If you lie on your back, put a pillow under your knees.  Only take over-the-counter or prescription medicines as directed by your caregiver. Over-the-counter medicines to reduce pain and inflammation are often the most helpful.Your caregiver may prescribe muscle relaxant drugs.These medicines help dull your pain so you can more quickly return to your normal activities and healthy exercise.  Put ice on the injured area.  Put ice in a plastic bag.  Place a towel between your skin and the bag.  Leave the ice on for 15-20 minutes, 03-04 times a day for the first 2 to 3 days. After that, ice and heat may be alternated  to reduce pain and spasms.  Ask your caregiver about trying back exercises and gentle massage. This may be of some benefit.  Avoid feeling anxious or stressed.Stress increases muscle tension and can worsen back pain.It is important to recognize when you are anxious or stressed and learn ways to manage it.Exercise is a great option. SEEK MEDICAL CARE IF:  You have pain that is not relieved with rest or medicine.  You have pain that does not improve in 1 week.  You have new  symptoms.  You are generally not feeling well. SEEK IMMEDIATE MEDICAL CARE IF:   You have pain that radiates from your back into your legs.  You develop new bowel or bladder control problems.  You have unusual weakness or numbness in your arms or legs.  You develop nausea or vomiting.  You develop abdominal pain.  You feel faint. Document Released: 12/19/2004 Document Revised: 06/20/2011 Document Reviewed: 05/09/2010 [facility] Patient Information 2014 [employer] [place].

## 2013-05-12 NOTE — ED Provider Notes (Signed)
  This was a shared visit with a mid-level provided (NP or PA).  Throughout the patient's course I was available for consultation/collaboration.  I saw the ECG (if appropriate), relevant labs and studies - I agree with the interpretation.  On my exam the patient was in no distress.  She was neurologically intact.  Had lengthy conversation about both additional evaluation with her existing care team, as well as consideration of alternative therapy including chiropractic care.      Gerhard Munchobert Mycala Warshawsky, MD 05/12/13 Ernestina [place]1922

## 2013-05-12 NOTE — ED Provider Notes (Signed)
CSN: [id number]     Arrival date & time 05/12/13  1429 History  This chart was scribed for non-physician practitioner working with Gerhard Munchobert Lockwood, MD, by Jarvis Morganaylor Ferguson, ED Scribe. This patient was seen in room TR09C/TR09C and the patient's care was started at 3:33 PM.    Chief Complaint  Patient presents with  . Leg Pain    The history is provided by the patient. No language interpreter was used.   HPI Comments: Tracy Barton is a 68 y.o. female who presents to the Emergency Department complaining of constant, burning tingling, right back pain with radiation down to her right leg onset 3-4 months ago. Patient states that the pain is localized in her right hip and leg. Patient was seen in the ED for this pain back in February of 2015. Patient states that the pain is exacerbated by movement and sitting on it. Patient denies any new falls or injuries to the area. Patient has taken her prescribed Vicodin and Zanaflex at home with no relief. Patient states that the Percocet has been helping her pain. She was due for an MRI today but missed her appointment and decided to come to the emergency department instead. Patient denies any fever, urinary incontinence, or bowel incontinence. No hx of cancer, IVDA.     Past Medical History  Diagnosis Date  . Neuromuscular disorder   . Depression   . Blood transfusion without reported diagnosis    Past Surgical History  Procedure Laterality Date  . Brain surgery    . Tubal ligation     Family History  Problem Relation Age of Onset  . Stroke Mother   . Cancer Mother   . Cancer Brother   . Mental illness Maternal Grandmother    History  Substance Use Topics  . Smoking status: Current Some Day Smoker -- 0.50 packs/day  . Smokeless tobacco: Not on file  . Alcohol Use: 1.2 oz/week    2 Cans of beer per week   OB History   Grav Para Term Preterm Abortions TAB SAB Ect Mult Living                 Review of Systems  Constitutional: Negative  for fever.  Gastrointestinal:       No bowel incontinence  Genitourinary:       No urinary incontinence  Musculoskeletal: Positive for arthralgias and myalgias (right hip, right leg).  All other systems reviewed and are negative.     Allergies  Review of patient's allergies indicates no known allergies.  Home Medications   Prior to Admission medications   Medication Sig Start Date End Date Taking? Authorizing Provider  acetaminophen (TYLENOL) 500 MG tablet Take 500 mg by mouth every 8 (eight) hours as needed for moderate pain.   Yes Historical Provider, MD  Calcium-Vit D-Arg-Inos-Silicon (BONE DENSITY) 300-200 MG-UNIT TABS Take 1 tablet by mouth 2 (two) times daily. 12/02/12  Yes Phillips OdorJeffery Anderson, MD  divalproex (DEPAKOTE ER) 500 MG 24 hr tablet Take 500 mg by mouth at bedtime.   Yes Historical Provider, MD  HYDROcodone-acetaminophen (NORCO/VICODIN) 5-325 MG per tablet Take 1 tablet by mouth every 4 (four) hours as needed for moderate pain. 03/12/13  Yes Collene GobbleSteven A Daub, MD  lansoprazole (PREVACID) 30 MG capsule Take 1 capsule (30 mg total) by mouth daily at 12 noon. 03/24/13  Yes Collene GobbleSteven A Daub, MD  meloxicam (MOBIC) 15 MG tablet Take 15 mg by mouth as needed for pain.   Yes Historical Provider, MD  sucralfate (CARAFATE) 1 G tablet Take 1 tablet (1 g total) by mouth 4 (four) times daily -  with meals and at bedtime. 03/24/13  Yes Collene GobbleSteven A Daub, MD  tiZANidine (ZANAFLEX) 4 MG tablet Take 4 mg by mouth every 6 (six) hours as needed for muscle spasms.   Yes Historical Provider, MD  zoster vaccine live, PF, (ZOSTAVAX) [id number] UNT/0.65ML injection Inject 19,400 Units into the skin once. 03/24/13  Yes Collene GobbleSteven A Daub, MD   Triage Vitals: BP 132/61  Pulse 55  Temp(Src) 98.8 F (37.1 C) (Oral)  Resp 16  SpO2 98%  Physical Exam  Nursing note and vitals reviewed. Constitutional: She is oriented to person, place, and time. She appears well-developed and well-nourished. No distress.  HENT:  Head:  Normocephalic and atraumatic.  Right Ear: External ear normal.  Left Ear: External ear normal.  Nose: Nose normal.  Mouth/Throat: Oropharynx is clear and moist. No oropharyngeal exudate.  Eyes: Conjunctivae and EOM are normal. Pupils are equal, round, and reactive to light.  Neck: Normal range of motion. Neck supple.  Cardiovascular: Normal rate, regular rhythm, normal heart sounds and intact distal pulses.   Pulmonary/Chest: Effort normal and breath sounds normal. No respiratory distress.  Abdominal: Soft. There is no tenderness.  Musculoskeletal:  Tender in hip. No erythema or warmth   Neurological: She is alert and oriented to person, place, and time. She has normal strength. No cranial nerve deficit. Gait normal. GCS eye subscore is 4. GCS verbal subscore is 5. GCS motor subscore is 6.  Sensation grossly intact.  No pronator drift.  Bilateral heel-knee-shin intact.  Skin: Skin is warm and dry. She is not diaphoretic.    ED Course  Procedures (including critical care time) Medications - No data to display   DIAGNOSTIC STUDIES: Oxygen Saturation is 98% on RA, normal by my interpretation.    COORDINATION OF CARE:    Labs Review Labs Reviewed - No data to display  Imaging Review No results found.   EKG Interpretation None      MDM   Final diagnoses:  Back pain with sciatica    Filed Vitals:   05/12/13 1615  BP: 148/86  Pulse: 54  Temp: 97.5 F (36.4 C)  Resp: 18   Afebrile, NAD, non-toxic appearing, AAOx4.   Patient with back pain with right sided radiculopathy like symptoms.  No neurological deficits and normal neuro exam. The right hip is not warm, swollen, erythematous. Range of motion is intact. Patient can walk but states is painful.  No loss of bowel or bladder control.  No concern for cauda equina.  No fever, night sweats, weight loss, h/o cancer, IVDU.  RICE protocol and pain medicine indicated and discussed with patient. Advised patient reschedule  her outpatient MRI. Return reactions discussed. Patient is agreeable to plan. Patient is still to discharge. Patient d/w with Dr. Jeraldine LootsLockwood, agrees with plan.    I personally performed the services described in this documentation, which was scribed in my presence. The recorded information has been reviewed and is accurate.     Lise AuerJennifer L Eben Choinski, PA-C 05/12/13 1640

## 2013-05-12 NOTE — ED Notes (Signed)
Rt hip and thigh pain has been there for a while  3-4 months, seen for this same pain before hurts to walk , her foot hurts also

## 2014-02-18 ENCOUNTER — Telehealth: Payer: Self-pay

## 2014-02-18 NOTE — Telephone Encounter (Signed)
Called and left message for Glenda to refax us the forms to my attention.

## 2014-02-18 NOTE — Telephone Encounter (Signed)
The original form was placed in Dr. Ellis Parentsaub's box but he wrote a note to send it to 104, so form was forwarded to billing office.

## 2014-02-18 NOTE — Telephone Encounter (Signed)
[facility] is calling on behalf of patient about a CDT Transition forms that needs to be filled out. Rivka BarbaraGlenda states that she faxed the form twice and hasn't heard anything. Please call and ask for [facility] [phone]

## 2014-02-18 NOTE — Telephone Encounter (Signed)
Can you help Tracy Barton?

## 2014-02-23 DIAGNOSIS — F329 Major depressive disorder, single episode, unspecified: Secondary | ICD-10-CM | POA: Diagnosis not present

## 2014-02-24 DIAGNOSIS — F329 Major depressive disorder, single episode, unspecified: Secondary | ICD-10-CM | POA: Diagnosis not present

## 2014-02-25 DIAGNOSIS — F329 Major depressive disorder, single episode, unspecified: Secondary | ICD-10-CM | POA: Diagnosis not present

## 2014-02-26 DIAGNOSIS — F329 Major depressive disorder, single episode, unspecified: Secondary | ICD-10-CM | POA: Diagnosis not present

## 2014-02-27 DIAGNOSIS — F329 Major depressive disorder, single episode, unspecified: Secondary | ICD-10-CM | POA: Diagnosis not present

## 2014-02-28 DIAGNOSIS — F329 Major depressive disorder, single episode, unspecified: Secondary | ICD-10-CM | POA: Diagnosis not present

## 2014-03-01 DIAGNOSIS — F329 Major depressive disorder, single episode, unspecified: Secondary | ICD-10-CM | POA: Diagnosis not present

## 2014-03-25 ENCOUNTER — Telehealth: Payer: Self-pay

## 2014-03-28 NOTE — Telephone Encounter (Signed)
[facility] faxed over a form for Dr. Cleta Albertsaub for Medical Assistance, but Dr. Cleta Albertsaub has not seen pt in >1 yr. Called and [place] for her to call back. She will need to be seen to get this form filled. Form in Nurse's box

## 2014-03-30 NOTE — Telephone Encounter (Signed)
Called pt, LMOM to CB. 

## 2014-04-06 DIAGNOSIS — F329 Major depressive disorder, single episode, unspecified: Secondary | ICD-10-CM | POA: Diagnosis not present

## 2014-04-07 DIAGNOSIS — F329 Major depressive disorder, single episode, unspecified: Secondary | ICD-10-CM | POA: Diagnosis not present

## 2014-04-08 ENCOUNTER — Telehealth: Payer: Self-pay | Admitting: Family Medicine

## 2014-04-08 DIAGNOSIS — F329 Major depressive disorder, single episode, unspecified: Secondary | ICD-10-CM | POA: Diagnosis not present

## 2014-04-08 NOTE — Telephone Encounter (Signed)
Spoke with patient i advised her that Dr Cleta Albertsaub wants her to come in for a f/u on medicine and medical problems

## 2014-04-09 DIAGNOSIS — F329 Major depressive disorder, single episode, unspecified: Secondary | ICD-10-CM | POA: Diagnosis not present

## 2014-04-10 DIAGNOSIS — F329 Major depressive disorder, single episode, unspecified: Secondary | ICD-10-CM | POA: Diagnosis not present

## 2014-04-10 NOTE — Telephone Encounter (Signed)
See other phone message. Pt made aware that she'll have to be seen. Form in nurse's box.

## 2014-04-11 DIAGNOSIS — F329 Major depressive disorder, single episode, unspecified: Secondary | ICD-10-CM | POA: Diagnosis not present

## 2014-04-12 DIAGNOSIS — F329 Major depressive disorder, single episode, unspecified: Secondary | ICD-10-CM | POA: Diagnosis not present

## 2014-04-13 DIAGNOSIS — F329 Major depressive disorder, single episode, unspecified: Secondary | ICD-10-CM | POA: Diagnosis not present

## 2014-04-14 DIAGNOSIS — F329 Major depressive disorder, single episode, unspecified: Secondary | ICD-10-CM | POA: Diagnosis not present

## 2014-04-15 ENCOUNTER — Ambulatory Visit (INDEPENDENT_AMBULATORY_CARE_PROVIDER_SITE_OTHER): Payer: Commercial Managed Care - HMO

## 2014-04-15 ENCOUNTER — Ambulatory Visit (INDEPENDENT_AMBULATORY_CARE_PROVIDER_SITE_OTHER): Payer: Commercial Managed Care - HMO | Admitting: Emergency Medicine

## 2014-04-15 VITALS — BP 126/70 | HR 54 | Temp 98.0°F | Resp 18 | Ht 63.5 in | Wt 113.0 lb

## 2014-04-15 DIAGNOSIS — M5441 Lumbago with sciatica, right side: Secondary | ICD-10-CM | POA: Diagnosis not present

## 2014-04-15 DIAGNOSIS — F17200 Nicotine dependence, unspecified, uncomplicated: Secondary | ICD-10-CM

## 2014-04-15 DIAGNOSIS — S3992XS Unspecified injury of lower back, sequela: Secondary | ICD-10-CM

## 2014-04-15 DIAGNOSIS — S3992XA Unspecified injury of lower back, initial encounter: Secondary | ICD-10-CM | POA: Insufficient documentation

## 2014-04-15 DIAGNOSIS — F329 Major depressive disorder, single episode, unspecified: Secondary | ICD-10-CM | POA: Diagnosis not present

## 2014-04-15 DIAGNOSIS — Z720 Tobacco use: Secondary | ICD-10-CM

## 2014-04-15 DIAGNOSIS — R634 Abnormal weight loss: Secondary | ICD-10-CM | POA: Diagnosis not present

## 2014-04-15 LAB — COMPREHENSIVE METABOLIC PANEL
ALT: 11 U/L (ref 0–35)
AST: 17 U/L (ref 0–37)
Albumin: 4 g/dL (ref 3.5–5.2)
Alkaline Phosphatase: 81 U/L (ref 39–117)
BUN: 19 mg/dL (ref 6–23)
CO2: 23 mEq/L (ref 19–32)
Calcium: 9.5 mg/dL (ref 8.4–10.5)
Chloride: 104 mEq/L (ref 96–112)
Creat: 0.8 mg/dL (ref 0.50–1.10)
Glucose, Bld: 66 mg/dL — ABNORMAL LOW (ref 70–99)
Potassium: 4 mEq/L (ref 3.5–5.3)
Sodium: 139 mEq/L (ref 135–145)
Total Bilirubin: 0.3 mg/dL (ref 0.2–1.2)
Total Protein: 6.5 g/dL (ref 6.0–8.3)

## 2014-04-15 LAB — POCT CBC
Granulocyte percent: 43.4 %G (ref 37–80)
HCT, POC: 40.1 % (ref 37.7–47.9)
Hemoglobin: 12.9 g/dL (ref 12.2–16.2)
Lymph, poc: 3.4 (ref 0.6–3.4)
MCH, POC: 27.4 pg (ref 27–31.2)
MCHC: 32.2 g/dL (ref 31.8–35.4)
MCV: 84.9 fL (ref 80–97)
MID (cbc): 0.4 (ref 0–0.9)
MPV: 7.7 fL (ref 0–99.8)
POC Granulocyte: 2.9 (ref 2–6.9)
POC LYMPH PERCENT: 50.9 %L — AB (ref 10–50)
POC MID %: 5.7 %M (ref 0–12)
Platelet Count, POC: 245 10*3/uL (ref 142–424)
RBC: 4.72 M/uL (ref 4.04–5.48)
RDW, POC: 12.6 %
WBC: 6.7 10*3/uL (ref 4.6–10.2)

## 2014-04-15 IMAGING — CR DG CHEST 2V
2 series · 2 of 2 positions shown · non-contrast
Comparison: [DATE]

CLINICAL DATA: Weight loss, smoker

EXAM:
CHEST  2 VIEW

[PA]
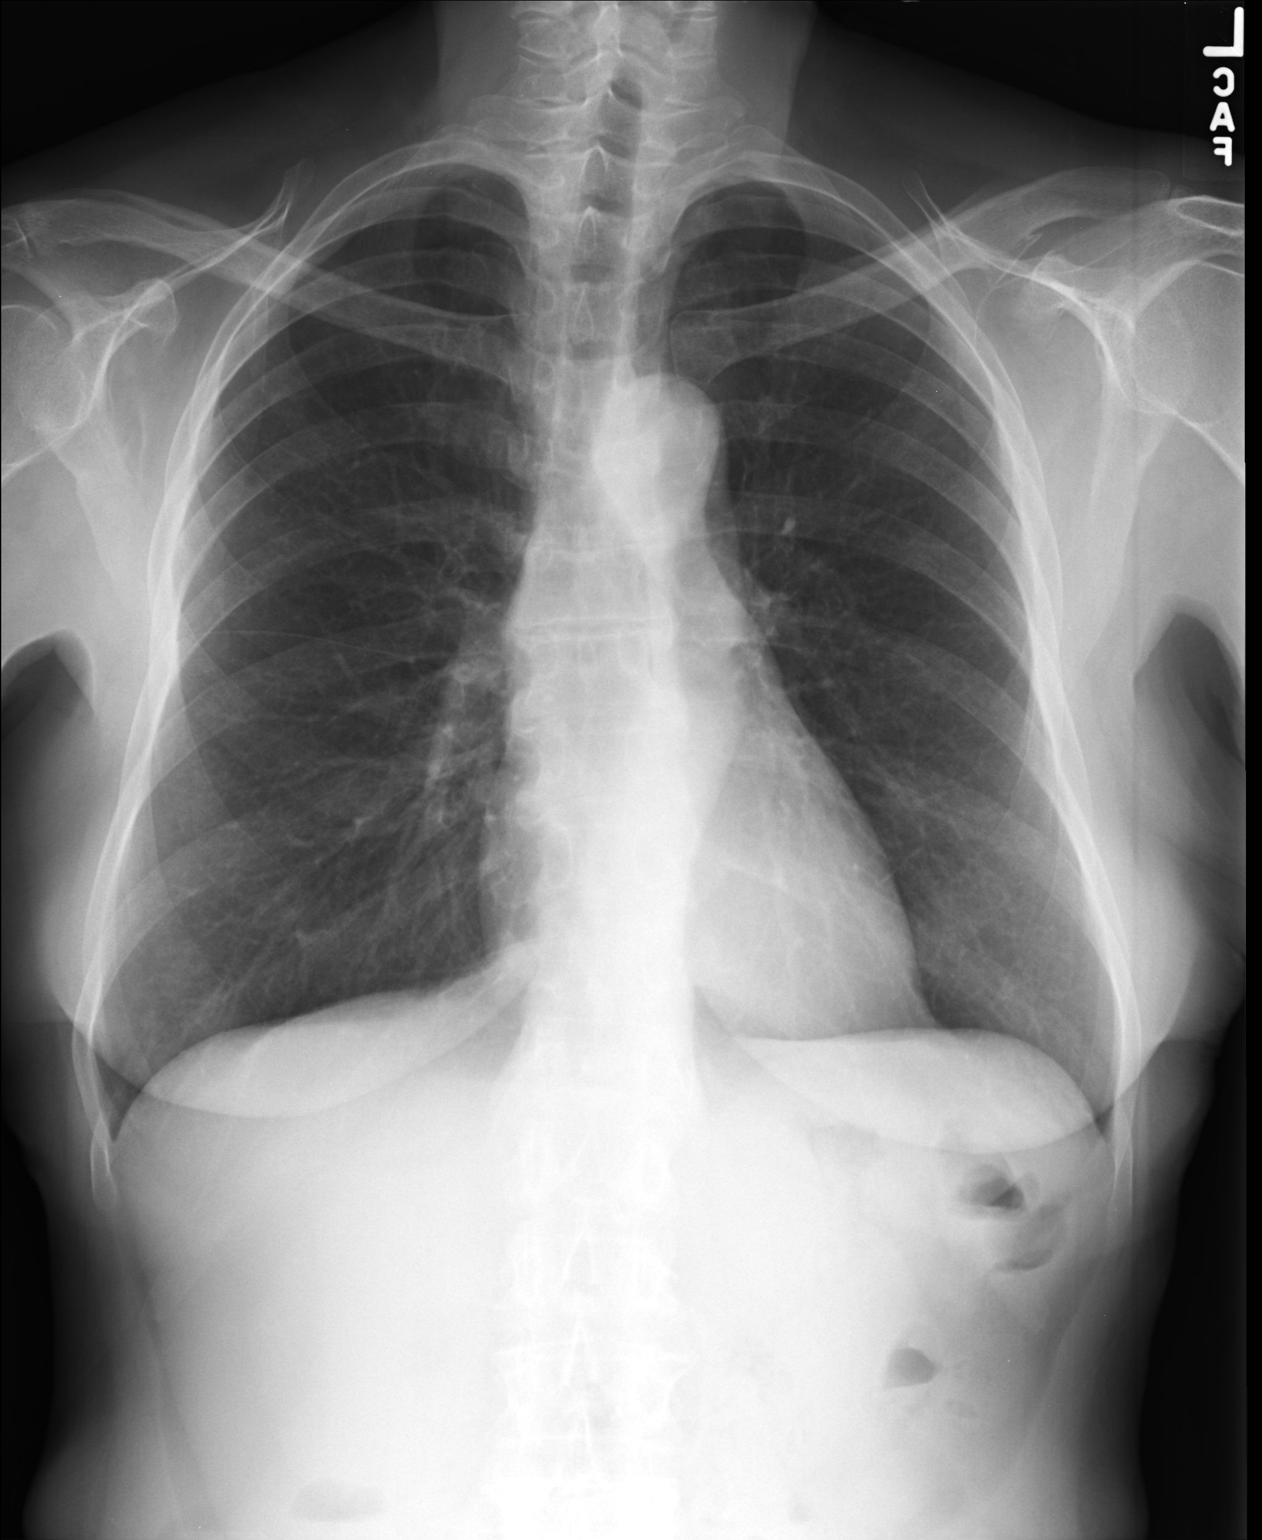

[lateral]
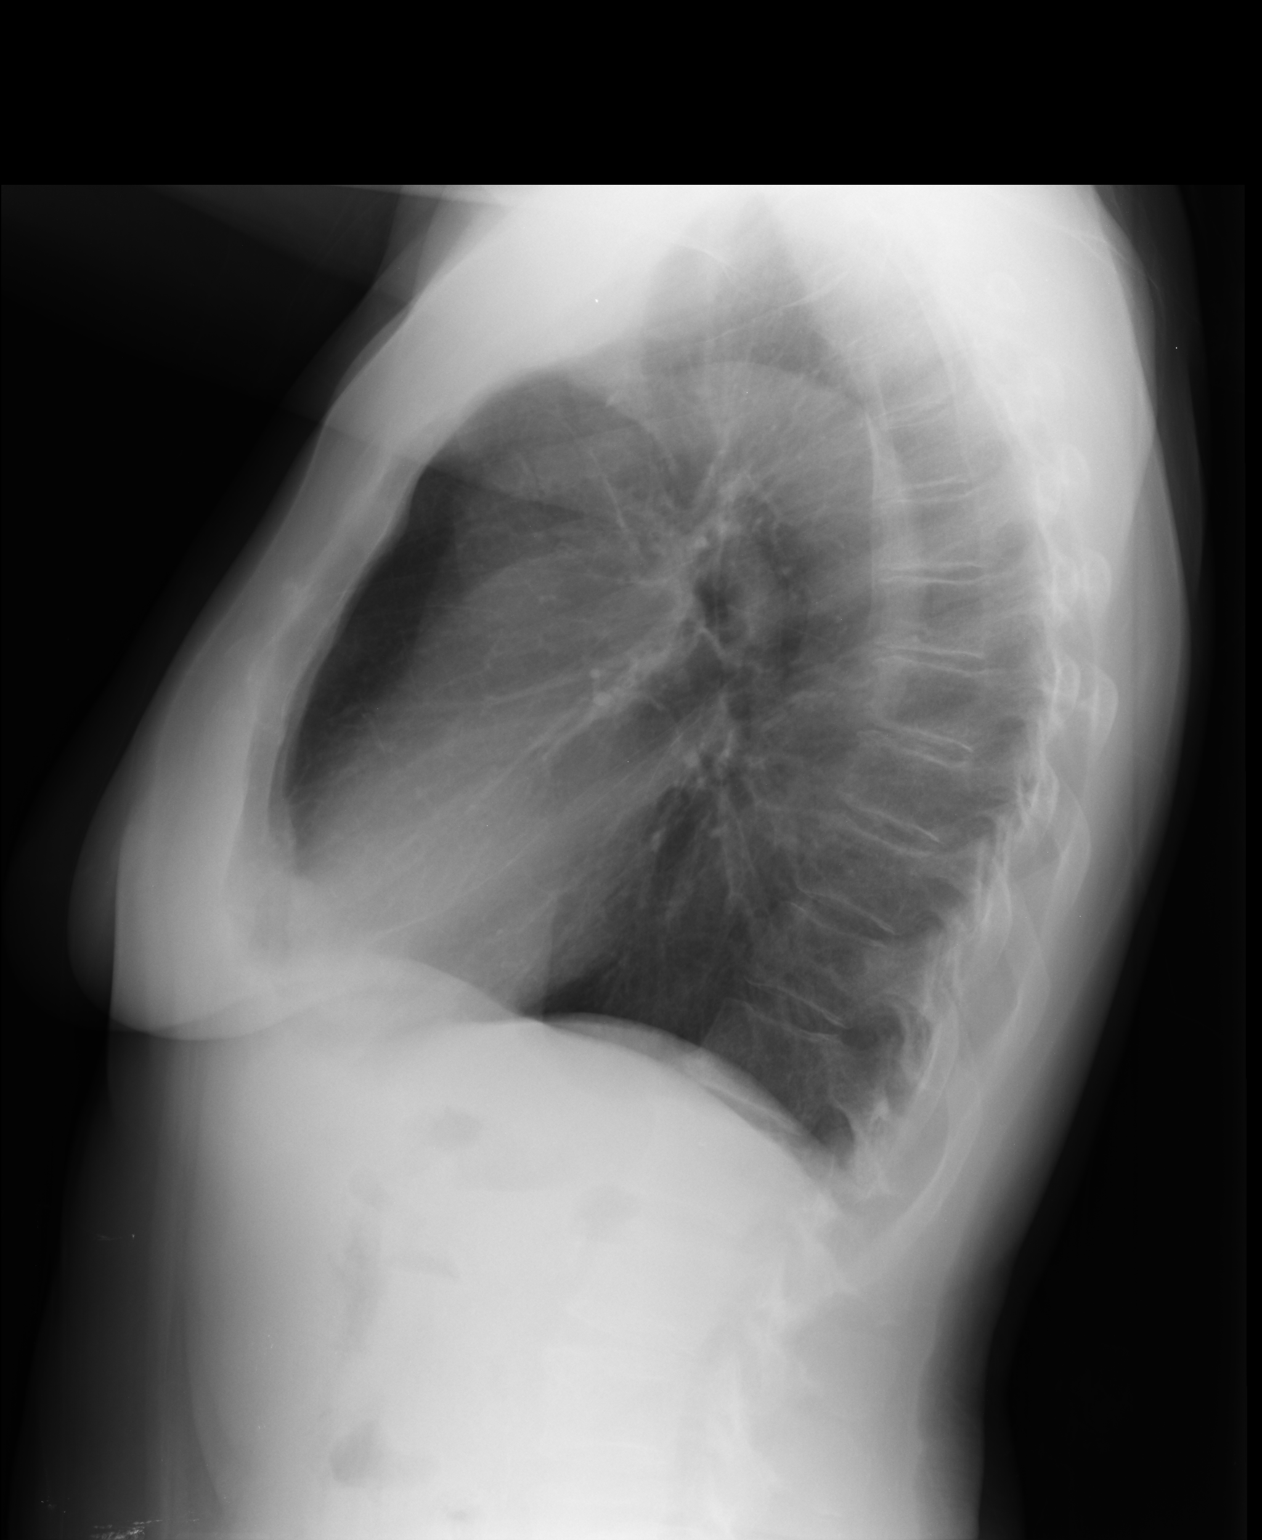

[2 of 2 positions shown; findings below may reference images not displayed]

FINDINGS: Normal heart size and pulmonary vascularity.

Tortuous aorta with atherosclerotic calcification.

Lungs emphysematous but clear.

No pleural effusion or pneumothorax.

Bones unremarkable.
IMPRESSION: Emphysematous changes.

No acute abnormalities.

## 2014-04-15 IMAGING — CR DG LUMBAR SPINE COMPLETE 4+V
5 series · 5 of 5 positions shown · non-contrast
Comparison: None.

CLINICAL DATA: Lumbar back pain radiating to right leg. Right-sided
sciatica.

EXAM:
LUMBAR SPINE - COMPLETE 4+ VIEW

[AP]
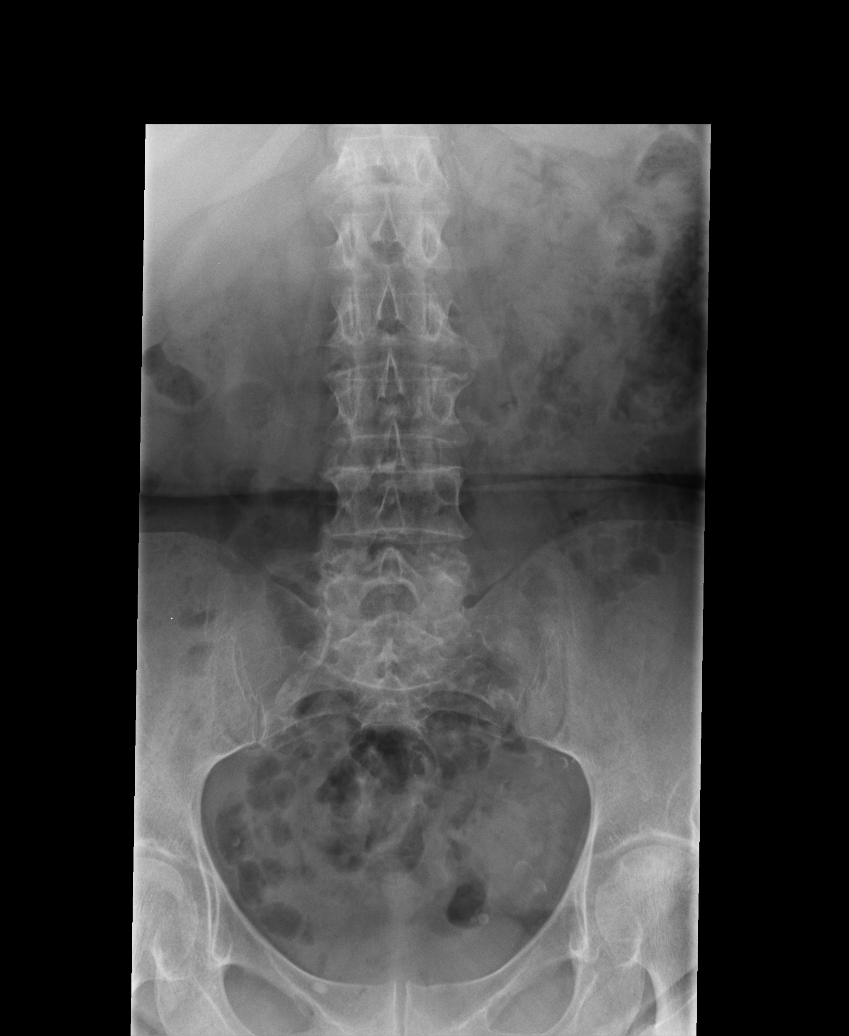

[rpo]
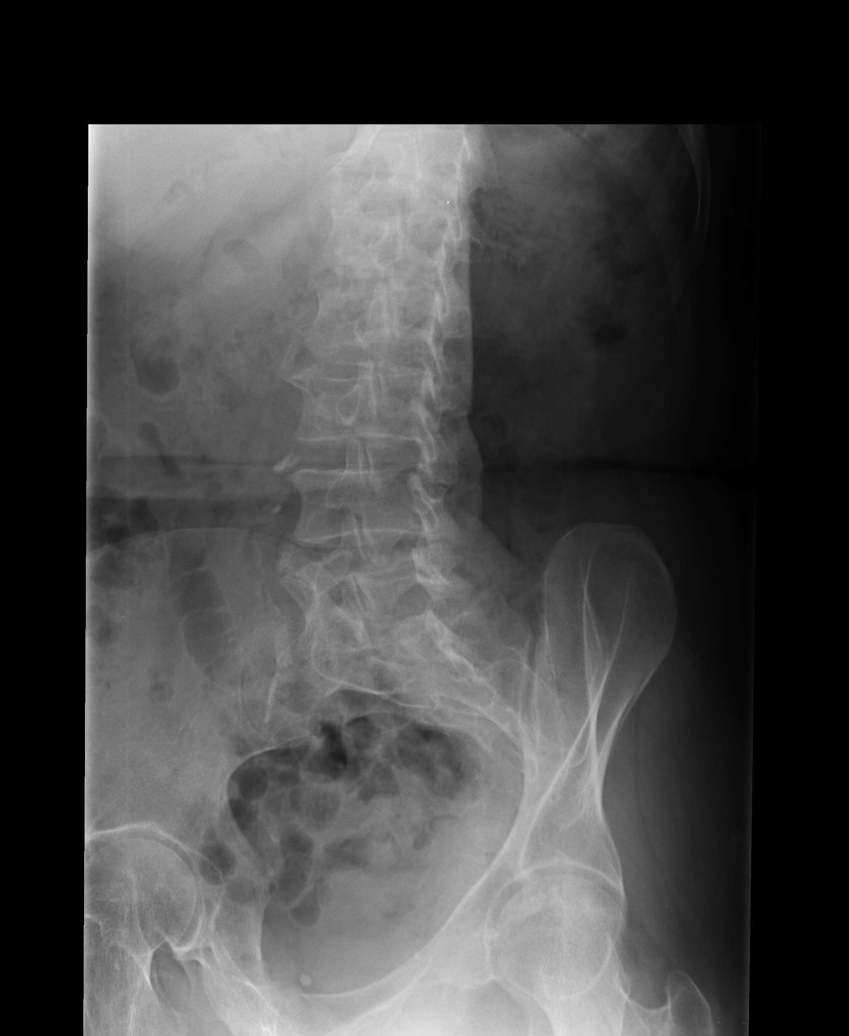

[lpo]
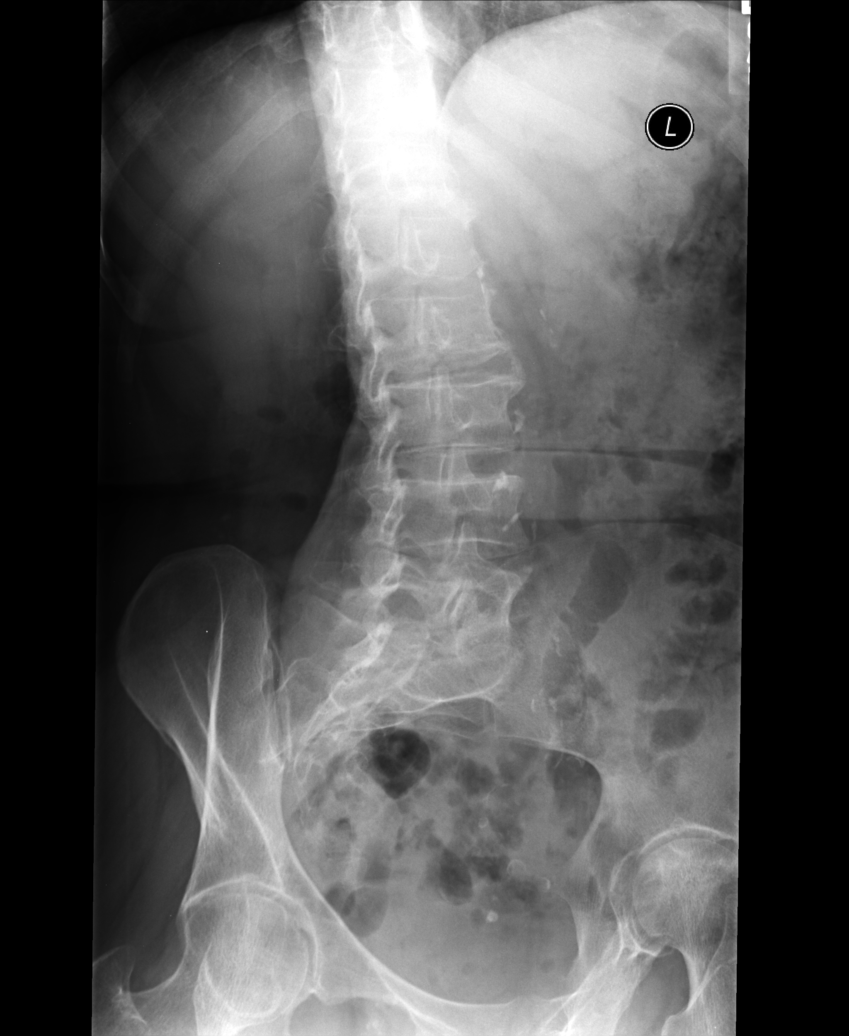

[lateral]
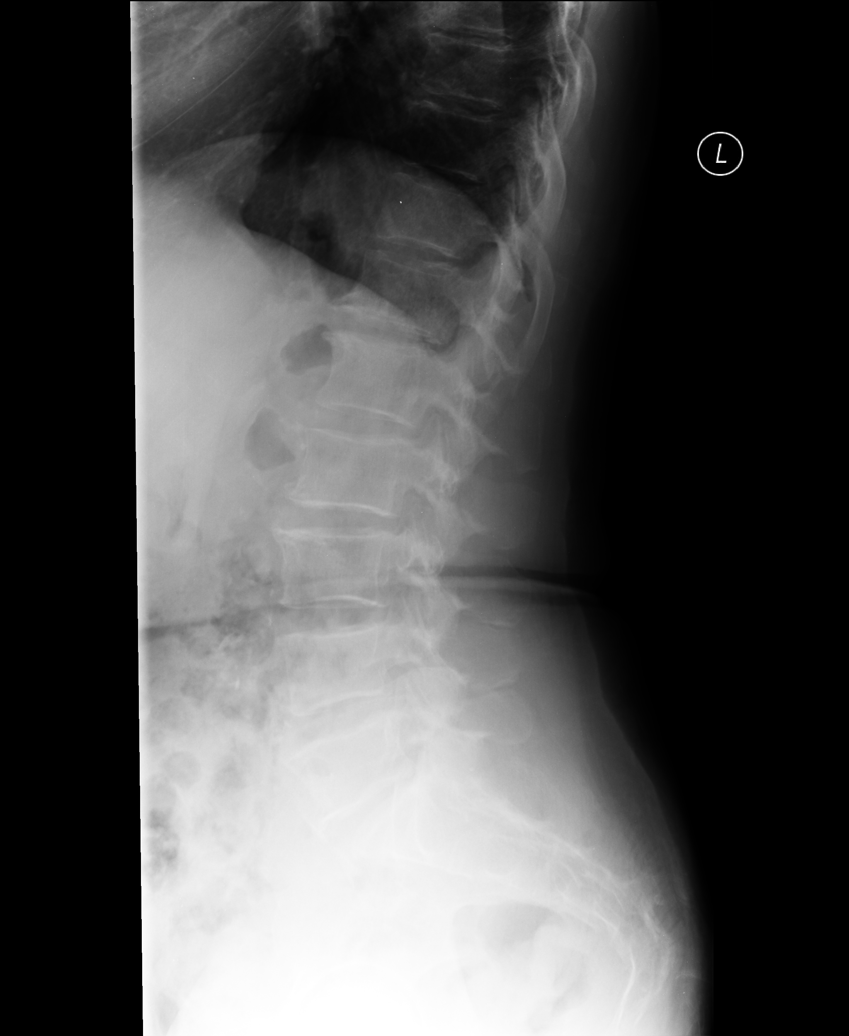

[l5 s1]
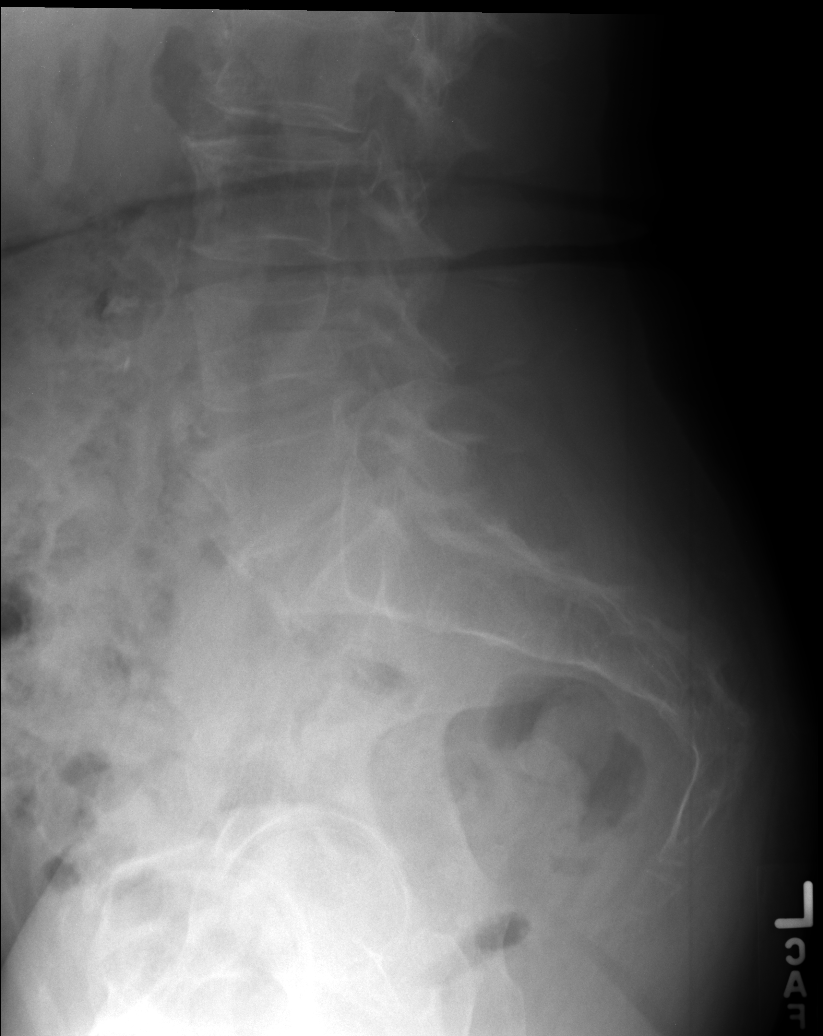

[5 of 5 positions shown; findings below may reference images not displayed]

FINDINGS: There is no evidence of lumbar spine fracture.  Alignment is normal.

Intervertebral disc spaces are maintained, however mild vertebral
end plate osteophytosis is seen at levels of T12 -L1 and L2-3. No
significant facet arthropathy or other bone lesions identified.
IMPRESSION: No acute findings.

Early degenerative spondylosis, as described above.

## 2014-04-15 NOTE — Patient Instructions (Signed)
Return to clinic next door 9:15am on 05/12/2014.
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
## 2014-04-16 DIAGNOSIS — F329 Major depressive disorder, single episode, unspecified: Secondary | ICD-10-CM | POA: Diagnosis not present

## 2014-04-16 LAB — TSH: TSH: 1.069 u[IU]/mL (ref 0.350–4.500)

## 2014-04-17 DIAGNOSIS — F329 Major depressive disorder, single episode, unspecified: Secondary | ICD-10-CM | POA: Diagnosis not present

## 2014-04-18 DIAGNOSIS — F329 Major depressive disorder, single episode, unspecified: Secondary | ICD-10-CM | POA: Diagnosis not present

## 2014-04-19 DIAGNOSIS — F329 Major depressive disorder, single episode, unspecified: Secondary | ICD-10-CM | POA: Diagnosis not present

## 2014-04-20 DIAGNOSIS — F329 Major depressive disorder, single episode, unspecified: Secondary | ICD-10-CM | POA: Diagnosis not present

## 2014-04-21 DIAGNOSIS — F329 Major depressive disorder, single episode, unspecified: Secondary | ICD-10-CM | POA: Diagnosis not present

## 2014-04-22 DIAGNOSIS — F329 Major depressive disorder, single episode, unspecified: Secondary | ICD-10-CM | POA: Diagnosis not present

## 2014-04-23 DIAGNOSIS — F329 Major depressive disorder, single episode, unspecified: Secondary | ICD-10-CM | POA: Diagnosis not present

## 2014-04-24 DIAGNOSIS — F329 Major depressive disorder, single episode, unspecified: Secondary | ICD-10-CM | POA: Diagnosis not present

## 2014-04-25 DIAGNOSIS — F329 Major depressive disorder, single episode, unspecified: Secondary | ICD-10-CM | POA: Diagnosis not present

## 2014-04-26 DIAGNOSIS — F329 Major depressive disorder, single episode, unspecified: Secondary | ICD-10-CM | POA: Diagnosis not present

## 2014-04-27 DIAGNOSIS — F329 Major depressive disorder, single episode, unspecified: Secondary | ICD-10-CM | POA: Diagnosis not present

## 2014-04-28 DIAGNOSIS — F329 Major depressive disorder, single episode, unspecified: Secondary | ICD-10-CM | POA: Diagnosis not present

## 2014-04-29 DIAGNOSIS — F329 Major depressive disorder, single episode, unspecified: Secondary | ICD-10-CM | POA: Diagnosis not present

## 2014-04-30 DIAGNOSIS — F329 Major depressive disorder, single episode, unspecified: Secondary | ICD-10-CM | POA: Diagnosis not present

## 2014-05-01 DIAGNOSIS — F329 Major depressive disorder, single episode, unspecified: Secondary | ICD-10-CM | POA: Diagnosis not present

## 2014-05-02 DIAGNOSIS — F329 Major depressive disorder, single episode, unspecified: Secondary | ICD-10-CM | POA: Diagnosis not present

## 2014-05-03 DIAGNOSIS — F329 Major depressive disorder, single episode, unspecified: Secondary | ICD-10-CM | POA: Diagnosis not present

## 2014-05-04 DIAGNOSIS — F329 Major depressive disorder, single episode, unspecified: Secondary | ICD-10-CM | POA: Diagnosis not present

## 2014-05-05 DIAGNOSIS — F329 Major depressive disorder, single episode, unspecified: Secondary | ICD-10-CM | POA: Diagnosis not present

## 2014-05-06 DIAGNOSIS — F329 Major depressive disorder, single episode, unspecified: Secondary | ICD-10-CM | POA: Diagnosis not present

## 2014-05-07 ENCOUNTER — Telehealth: Payer: Self-pay

## 2014-05-07 DIAGNOSIS — F329 Major depressive disorder, single episode, unspecified: Secondary | ICD-10-CM | POA: Diagnosis not present

## 2014-05-07 NOTE — Telephone Encounter (Signed)
Received a call from Lavona Moundreva Mcintyre with [facility] [phone]. She states she has sent over a form for "home care services to continue" and states we have been returning the form incompleted. Vear Clockreva is refaxing the form and will make a notation of what sections are incomplete. Please be on the look out for this form, she states this will be her third attempt to get this completed

## 2014-05-08 DIAGNOSIS — F329 Major depressive disorder, single episode, unspecified: Secondary | ICD-10-CM | POA: Diagnosis not present

## 2014-05-09 DIAGNOSIS — F329 Major depressive disorder, single episode, unspecified: Secondary | ICD-10-CM | POA: Diagnosis not present

## 2014-05-10 DIAGNOSIS — F329 Major depressive disorder, single episode, unspecified: Secondary | ICD-10-CM | POA: Diagnosis not present

## 2014-05-11 DIAGNOSIS — F329 Major depressive disorder, single episode, unspecified: Secondary | ICD-10-CM | POA: Diagnosis not present

## 2014-05-12 ENCOUNTER — Encounter: Payer: Self-pay | Admitting: Emergency Medicine

## 2014-05-12 DIAGNOSIS — F329 Major depressive disorder, single episode, unspecified: Secondary | ICD-10-CM | POA: Diagnosis not present

## 2014-05-13 DIAGNOSIS — F329 Major depressive disorder, single episode, unspecified: Secondary | ICD-10-CM | POA: Diagnosis not present

## 2014-05-14 DIAGNOSIS — F329 Major depressive disorder, single episode, unspecified: Secondary | ICD-10-CM | POA: Diagnosis not present

## 2014-05-15 DIAGNOSIS — F329 Major depressive disorder, single episode, unspecified: Secondary | ICD-10-CM | POA: Diagnosis not present

## 2014-05-16 DIAGNOSIS — F329 Major depressive disorder, single episode, unspecified: Secondary | ICD-10-CM | POA: Diagnosis not present

## 2014-05-17 DIAGNOSIS — F329 Major depressive disorder, single episode, unspecified: Secondary | ICD-10-CM | POA: Diagnosis not present

## 2014-05-18 DIAGNOSIS — F329 Major depressive disorder, single episode, unspecified: Secondary | ICD-10-CM | POA: Diagnosis not present

## 2014-05-19 DIAGNOSIS — F329 Major depressive disorder, single episode, unspecified: Secondary | ICD-10-CM | POA: Diagnosis not present

## 2014-05-20 DIAGNOSIS — F329 Major depressive disorder, single episode, unspecified: Secondary | ICD-10-CM | POA: Diagnosis not present

## 2014-05-21 DIAGNOSIS — F329 Major depressive disorder, single episode, unspecified: Secondary | ICD-10-CM | POA: Diagnosis not present

## 2014-05-22 DIAGNOSIS — F329 Major depressive disorder, single episode, unspecified: Secondary | ICD-10-CM | POA: Diagnosis not present

## 2014-05-23 DIAGNOSIS — F329 Major depressive disorder, single episode, unspecified: Secondary | ICD-10-CM | POA: Diagnosis not present

## 2014-05-24 DIAGNOSIS — F329 Major depressive disorder, single episode, unspecified: Secondary | ICD-10-CM | POA: Diagnosis not present

## 2014-05-25 DIAGNOSIS — F329 Major depressive disorder, single episode, unspecified: Secondary | ICD-10-CM | POA: Diagnosis not present

## 2014-05-26 DIAGNOSIS — F329 Major depressive disorder, single episode, unspecified: Secondary | ICD-10-CM | POA: Diagnosis not present

## 2014-05-27 DIAGNOSIS — F329 Major depressive disorder, single episode, unspecified: Secondary | ICD-10-CM | POA: Diagnosis not present

## 2014-05-28 DIAGNOSIS — F329 Major depressive disorder, single episode, unspecified: Secondary | ICD-10-CM | POA: Diagnosis not present

## 2014-05-29 ENCOUNTER — Telehealth: Payer: Self-pay

## 2014-05-29 DIAGNOSIS — F329 Major depressive disorder, single episode, unspecified: Secondary | ICD-10-CM | POA: Diagnosis not present

## 2014-05-29 NOTE — Telephone Encounter (Signed)
[name] from [facility] is calling because she sent a fax on April 28th and May 5th. The form was supposed to have been completed by Dr. Cleta Albertsaub and she hasn't gotten a response since she called here and left a message on May 5th. She will be sending the fax again and needs it complete as soon as possible. This is a change of status form. If there are any question please call Tracy. [phone]

## 2014-05-29 NOTE — Telephone Encounter (Signed)
Found this and put it back in Dr Ellis Parentsaub's box to be filled out correctly.

## 2014-05-30 DIAGNOSIS — F329 Major depressive disorder, single episode, unspecified: Secondary | ICD-10-CM | POA: Diagnosis not present

## 2014-05-31 DIAGNOSIS — F329 Major depressive disorder, single episode, unspecified: Secondary | ICD-10-CM | POA: Diagnosis not present

## 2014-06-01 DIAGNOSIS — F329 Major depressive disorder, single episode, unspecified: Secondary | ICD-10-CM | POA: Diagnosis not present

## 2014-06-02 DIAGNOSIS — F329 Major depressive disorder, single episode, unspecified: Secondary | ICD-10-CM | POA: Diagnosis not present

## 2014-06-03 DIAGNOSIS — F329 Major depressive disorder, single episode, unspecified: Secondary | ICD-10-CM | POA: Diagnosis not present

## 2014-06-04 ENCOUNTER — Encounter: Payer: Self-pay | Admitting: *Deleted

## 2014-06-04 DIAGNOSIS — F329 Major depressive disorder, single episode, unspecified: Secondary | ICD-10-CM | POA: Diagnosis not present

## 2014-06-05 DIAGNOSIS — F329 Major depressive disorder, single episode, unspecified: Secondary | ICD-10-CM | POA: Diagnosis not present

## 2014-06-06 DIAGNOSIS — F329 Major depressive disorder, single episode, unspecified: Secondary | ICD-10-CM | POA: Diagnosis not present

## 2014-06-07 DIAGNOSIS — F329 Major depressive disorder, single episode, unspecified: Secondary | ICD-10-CM | POA: Diagnosis not present

## 2014-06-08 DIAGNOSIS — F329 Major depressive disorder, single episode, unspecified: Secondary | ICD-10-CM | POA: Diagnosis not present

## 2014-06-09 DIAGNOSIS — F329 Major depressive disorder, single episode, unspecified: Secondary | ICD-10-CM | POA: Diagnosis not present

## 2014-06-09 NOTE — Telephone Encounter (Signed)
Vear Clockreva from [facility] states they really need the corrected copy of the form so it can get to the court. Please call [phone]

## 2014-06-10 DIAGNOSIS — F329 Major depressive disorder, single episode, unspecified: Secondary | ICD-10-CM | POA: Diagnosis not present

## 2014-06-10 NOTE — Telephone Encounter (Signed)
It was unclear to me when the patient came in initially what exactly she needed on her form. I think best  situation would be for her to come back in and me fill out the form correctly so there are no mistakes on the form

## 2014-06-11 DIAGNOSIS — F329 Major depressive disorder, single episode, unspecified: Secondary | ICD-10-CM | POA: Diagnosis not present

## 2014-06-12 DIAGNOSIS — F329 Major depressive disorder, single episode, unspecified: Secondary | ICD-10-CM | POA: Diagnosis not present

## 2014-06-12 NOTE — Telephone Encounter (Signed)
Called Tracy Barton and advised message from Dr. Cleta Alberts. She will contact pt.

## 2014-06-13 DIAGNOSIS — F329 Major depressive disorder, single episode, unspecified: Secondary | ICD-10-CM | POA: Diagnosis not present

## 2014-06-14 DIAGNOSIS — F329 Major depressive disorder, single episode, unspecified: Secondary | ICD-10-CM | POA: Diagnosis not present

## 2014-06-15 DIAGNOSIS — F329 Major depressive disorder, single episode, unspecified: Secondary | ICD-10-CM | POA: Diagnosis not present

## 2014-06-16 DIAGNOSIS — F329 Major depressive disorder, single episode, unspecified: Secondary | ICD-10-CM | POA: Diagnosis not present

## 2014-06-17 DIAGNOSIS — F329 Major depressive disorder, single episode, unspecified: Secondary | ICD-10-CM | POA: Diagnosis not present

## 2014-06-18 DIAGNOSIS — F329 Major depressive disorder, single episode, unspecified: Secondary | ICD-10-CM | POA: Diagnosis not present

## 2014-06-18 NOTE — Progress Notes (Signed)
This encounter was created in error - please disregard.

## 2014-06-19 DIAGNOSIS — F329 Major depressive disorder, single episode, unspecified: Secondary | ICD-10-CM | POA: Diagnosis not present

## 2014-06-20 DIAGNOSIS — F329 Major depressive disorder, single episode, unspecified: Secondary | ICD-10-CM | POA: Diagnosis not present

## 2014-06-21 DIAGNOSIS — F329 Major depressive disorder, single episode, unspecified: Secondary | ICD-10-CM | POA: Diagnosis not present

## 2014-06-22 ENCOUNTER — Ambulatory Visit (INDEPENDENT_AMBULATORY_CARE_PROVIDER_SITE_OTHER): Payer: Commercial Managed Care - HMO | Admitting: Emergency Medicine

## 2014-06-22 VITALS — BP 138/82 | HR 53 | Temp 97.7°F | Resp 17 | Ht 62.5 in | Wt 113.0 lb

## 2014-06-22 DIAGNOSIS — M5441 Lumbago with sciatica, right side: Secondary | ICD-10-CM

## 2014-06-22 DIAGNOSIS — S3992XS Unspecified injury of lower back, sequela: Secondary | ICD-10-CM

## 2014-06-22 DIAGNOSIS — R6889 Other general symptoms and signs: Secondary | ICD-10-CM | POA: Diagnosis not present

## 2014-06-22 DIAGNOSIS — F329 Major depressive disorder, single episode, unspecified: Secondary | ICD-10-CM | POA: Diagnosis not present

## 2014-06-22 DIAGNOSIS — K219 Gastro-esophageal reflux disease without esophagitis: Secondary | ICD-10-CM | POA: Diagnosis not present

## 2014-06-22 MED ORDER — MELOXICAM 15 MG PO TABS: 15.0000 mg | ORAL_TABLET | Freq: Every day | ORAL | Status: DC

## 2014-06-22 MED ORDER — LANSOPRAZOLE 30 MG PO CPDR: 30.0000 mg | DELAYED_RELEASE_CAPSULE | Freq: Every day | ORAL | Status: DC

## 2014-06-22 NOTE — Patient Instructions (Signed)
Gastroesophageal Reflux Disease, Adult Gastroesophageal reflux disease (GERD) happens when acid from your stomach flows up into the esophagus. When acid comes in contact with the esophagus, the acid causes soreness (inflammation) in the esophagus. Over time, GERD may create small holes (ulcers) in the lining of the esophagus. CAUSES   Increased body weight. This puts pressure on the stomach, making acid rise from the stomach into the esophagus.  Smoking. This increases acid production in the stomach.  Drinking alcohol. This causes decreased pressure in the lower esophageal sphincter (valve or ring of muscle between the esophagus and stomach), allowing acid from the stomach into the esophagus.  Late evening meals and a full stomach. This increases pressure and acid production in the stomach.  A malformed lower esophageal sphincter. Sometimes, no cause is found. SYMPTOMS   Burning pain in the lower part of the mid-chest behind the breastbone and in the mid-stomach area. This may occur twice a week or more often.  Trouble swallowing.  Sore throat.  Dry cough.  Asthma-like symptoms including chest tightness, shortness of breath, or wheezing. DIAGNOSIS  Your caregiver may be able to diagnose GERD based on your symptoms. In some cases, X-rays and other tests may be done to check for complications or to check the condition of your stomach and esophagus. TREATMENT  Your caregiver may recommend over-the-counter or prescription medicines to help decrease acid production. Ask your caregiver before starting or adding any new medicines.  HOME CARE INSTRUCTIONS   Change the factors that you can control. Ask your caregiver for guidance concerning weight loss, quitting smoking, and alcohol consumption.  Avoid foods and drinks that make your symptoms worse, such as:  Caffeine or alcoholic drinks.  Chocolate.  Peppermint or mint flavorings.  Garlic and onions.  Spicy foods.  Citrus fruits,  such as oranges, lemons, or limes.  Tomato-based foods such as sauce, chili, salsa, and pizza.  Fried and fatty foods.  Avoid lying down for the 3 hours prior to your bedtime or prior to taking a nap.  Eat small, frequent meals instead of large meals.  Wear loose-fitting clothing. Do not wear anything tight around your waist that causes pressure on your stomach.  Raise the head of your bed 6 to 8 inches with wood blocks to help you sleep. Extra pillows will not help.  Only take over-the-counter or prescription medicines for pain, discomfort, or fever as directed by your caregiver.  Do not take aspirin, ibuprofen, or other nonsteroidal anti-inflammatory drugs (NSAIDs). SEEK IMMEDIATE MEDICAL CARE IF:   You have pain in your arms, neck, jaw, teeth, or back.  Your pain increases or changes in intensity or duration.  You develop nausea, vomiting, or sweating (diaphoresis).  You develop shortness of breath, or you faint.  Your vomit is green, yellow, black, or looks like coffee grounds or blood.  Your stool is red, bloody, or black. These symptoms could be signs of other problems, such as heart disease, gastric bleeding, or esophageal bleeding. MAKE SURE YOU:   Understand these instructions.  Will watch your condition.  Will get help right away if you are not doing well or get worse. Document Released: 09/28/2004 Document Revised: 03/13/2011 Document Reviewed: 07/08/2010 ExitCare Patient Information 2015 [employer]. This information is not intended to replace advice given to you by your health care provider. Make sure you discuss any questions you have with your health care provider.  
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
## 2014-06-23 DIAGNOSIS — F329 Major depressive disorder, single episode, unspecified: Secondary | ICD-10-CM | POA: Diagnosis not present

## 2014-06-24 DIAGNOSIS — F329 Major depressive disorder, single episode, unspecified: Secondary | ICD-10-CM | POA: Diagnosis not present

## 2014-06-25 DIAGNOSIS — F329 Major depressive disorder, single episode, unspecified: Secondary | ICD-10-CM | POA: Diagnosis not present

## 2014-06-26 DIAGNOSIS — F329 Major depressive disorder, single episode, unspecified: Secondary | ICD-10-CM | POA: Diagnosis not present

## 2014-06-27 DIAGNOSIS — F329 Major depressive disorder, single episode, unspecified: Secondary | ICD-10-CM | POA: Diagnosis not present

## 2014-06-28 DIAGNOSIS — F329 Major depressive disorder, single episode, unspecified: Secondary | ICD-10-CM | POA: Diagnosis not present

## 2014-06-29 DIAGNOSIS — F329 Major depressive disorder, single episode, unspecified: Secondary | ICD-10-CM | POA: Diagnosis not present

## 2014-06-30 DIAGNOSIS — F329 Major depressive disorder, single episode, unspecified: Secondary | ICD-10-CM | POA: Diagnosis not present

## 2014-07-01 ENCOUNTER — Telehealth: Payer: Self-pay

## 2014-07-01 DIAGNOSIS — F329 Major depressive disorder, single episode, unspecified: Secondary | ICD-10-CM | POA: Diagnosis not present

## 2014-07-01 NOTE — Telephone Encounter (Signed)
Okay to finish filling out paperwork? Pt seen on 6/20.

## 2014-07-01 NOTE — Telephone Encounter (Signed)
Glenda from [facility] is calling because they receveid the paperwork work that we sent but it was incomplete. The date of onset and changes of status was left blank. She states that the patient was told she needed to come back for an office visit but the patient was here last week. Please call Glenda! [phone]

## 2014-07-02 DIAGNOSIS — F329 Major depressive disorder, single episode, unspecified: Secondary | ICD-10-CM | POA: Diagnosis not present

## 2014-07-02 NOTE — Telephone Encounter (Signed)
She was here last week for an acute problem. She did not see me she saw another provider because of back pain. I need her to see her specifically to fill out these forms

## 2014-07-02 NOTE — Telephone Encounter (Signed)
If time permits, when speaking to patient again, please address overdue mammo...  Thanks, Annabelle Harmanana, RN

## 2014-07-03 DIAGNOSIS — F329 Major depressive disorder, single episode, unspecified: Secondary | ICD-10-CM | POA: Diagnosis not present

## 2014-07-03 NOTE — Telephone Encounter (Signed)
Per chart review, Dr. Dareen PianoAnderson ordered mammo 06/22/14.

## 2014-07-03 NOTE — Telephone Encounter (Signed)
Spoke with pt, advised her to come in to see Dr. Cleta Albertsaub to fill out paperwork. Pt understood and will come in. I asked her about her mammogram she states she needs this done and is willing to have it scheduled.

## 2014-07-04 DIAGNOSIS — F329 Major depressive disorder, single episode, unspecified: Secondary | ICD-10-CM | POA: Diagnosis not present

## 2014-07-05 DIAGNOSIS — F329 Major depressive disorder, single episode, unspecified: Secondary | ICD-10-CM | POA: Diagnosis not present

## 2014-07-06 DIAGNOSIS — F329 Major depressive disorder, single episode, unspecified: Secondary | ICD-10-CM | POA: Diagnosis not present

## 2014-07-07 DIAGNOSIS — M5136 Other intervertebral disc degeneration, lumbar region: Secondary | ICD-10-CM | POA: Diagnosis not present

## 2014-07-07 DIAGNOSIS — F329 Major depressive disorder, single episode, unspecified: Secondary | ICD-10-CM | POA: Diagnosis not present

## 2014-07-07 DIAGNOSIS — M545 Low back pain: Secondary | ICD-10-CM | POA: Diagnosis not present

## 2014-07-07 DIAGNOSIS — M5127 Other intervertebral disc displacement, lumbosacral region: Secondary | ICD-10-CM | POA: Diagnosis not present

## 2014-07-07 DIAGNOSIS — R6889 Other general symptoms and signs: Secondary | ICD-10-CM | POA: Diagnosis not present

## 2014-07-08 DIAGNOSIS — F329 Major depressive disorder, single episode, unspecified: Secondary | ICD-10-CM | POA: Diagnosis not present

## 2014-07-09 DIAGNOSIS — F329 Major depressive disorder, single episode, unspecified: Secondary | ICD-10-CM | POA: Diagnosis not present

## 2014-07-10 DIAGNOSIS — F329 Major depressive disorder, single episode, unspecified: Secondary | ICD-10-CM | POA: Diagnosis not present

## 2014-07-11 DIAGNOSIS — F329 Major depressive disorder, single episode, unspecified: Secondary | ICD-10-CM | POA: Diagnosis not present

## 2014-07-12 DIAGNOSIS — F329 Major depressive disorder, single episode, unspecified: Secondary | ICD-10-CM | POA: Diagnosis not present

## 2014-07-13 DIAGNOSIS — F329 Major depressive disorder, single episode, unspecified: Secondary | ICD-10-CM | POA: Diagnosis not present

## 2014-07-14 DIAGNOSIS — F329 Major depressive disorder, single episode, unspecified: Secondary | ICD-10-CM | POA: Diagnosis not present

## 2014-07-15 DIAGNOSIS — F329 Major depressive disorder, single episode, unspecified: Secondary | ICD-10-CM | POA: Diagnosis not present

## 2014-07-16 DIAGNOSIS — F329 Major depressive disorder, single episode, unspecified: Secondary | ICD-10-CM | POA: Diagnosis not present

## 2014-07-17 ENCOUNTER — Other Ambulatory Visit: Payer: Self-pay | Admitting: Emergency Medicine

## 2014-07-17 DIAGNOSIS — Z1231 Encounter for screening mammogram for malignant neoplasm of breast: Secondary | ICD-10-CM

## 2014-07-17 DIAGNOSIS — F329 Major depressive disorder, single episode, unspecified: Secondary | ICD-10-CM | POA: Diagnosis not present

## 2014-07-18 DIAGNOSIS — F329 Major depressive disorder, single episode, unspecified: Secondary | ICD-10-CM | POA: Diagnosis not present

## 2014-07-19 DIAGNOSIS — F329 Major depressive disorder, single episode, unspecified: Secondary | ICD-10-CM | POA: Diagnosis not present

## 2014-07-20 DIAGNOSIS — F329 Major depressive disorder, single episode, unspecified: Secondary | ICD-10-CM | POA: Diagnosis not present

## 2014-07-21 DIAGNOSIS — F329 Major depressive disorder, single episode, unspecified: Secondary | ICD-10-CM | POA: Diagnosis not present

## 2014-07-22 DIAGNOSIS — F329 Major depressive disorder, single episode, unspecified: Secondary | ICD-10-CM | POA: Diagnosis not present

## 2014-07-23 DIAGNOSIS — F329 Major depressive disorder, single episode, unspecified: Secondary | ICD-10-CM | POA: Diagnosis not present

## 2014-07-24 DIAGNOSIS — F329 Major depressive disorder, single episode, unspecified: Secondary | ICD-10-CM | POA: Diagnosis not present

## 2014-07-25 DIAGNOSIS — F329 Major depressive disorder, single episode, unspecified: Secondary | ICD-10-CM | POA: Diagnosis not present

## 2014-07-26 DIAGNOSIS — F329 Major depressive disorder, single episode, unspecified: Secondary | ICD-10-CM | POA: Diagnosis not present

## 2014-07-27 ENCOUNTER — Ambulatory Visit
Admission: RE | Admit: 2014-07-27 | Discharge: 2014-07-27 | Disposition: A | Discharge status: Home / Self Care | Payer: Medicare HMO | Source: Ambulatory Visit | Attending: Emergency Medicine | Admitting: Emergency Medicine

## 2014-07-27 DIAGNOSIS — Z1231 Encounter for screening mammogram for malignant neoplasm of breast: Secondary | ICD-10-CM

## 2014-07-27 DIAGNOSIS — F329 Major depressive disorder, single episode, unspecified: Secondary | ICD-10-CM | POA: Diagnosis not present

## 2014-07-27 DIAGNOSIS — R6889 Other general symptoms and signs: Secondary | ICD-10-CM | POA: Diagnosis not present

## 2014-07-27 LAB — HM MAMMOGRAPHY

## 2014-07-28 DIAGNOSIS — F329 Major depressive disorder, single episode, unspecified: Secondary | ICD-10-CM | POA: Diagnosis not present

## 2014-07-29 DIAGNOSIS — F329 Major depressive disorder, single episode, unspecified: Secondary | ICD-10-CM | POA: Diagnosis not present

## 2014-07-30 DIAGNOSIS — F329 Major depressive disorder, single episode, unspecified: Secondary | ICD-10-CM | POA: Diagnosis not present

## 2014-07-31 DIAGNOSIS — F329 Major depressive disorder, single episode, unspecified: Secondary | ICD-10-CM | POA: Diagnosis not present

## 2014-08-01 DIAGNOSIS — F329 Major depressive disorder, single episode, unspecified: Secondary | ICD-10-CM | POA: Diagnosis not present

## 2014-08-02 DIAGNOSIS — F329 Major depressive disorder, single episode, unspecified: Secondary | ICD-10-CM | POA: Diagnosis not present

## 2014-08-03 DIAGNOSIS — F329 Major depressive disorder, single episode, unspecified: Secondary | ICD-10-CM | POA: Diagnosis not present

## 2014-08-04 DIAGNOSIS — F329 Major depressive disorder, single episode, unspecified: Secondary | ICD-10-CM | POA: Diagnosis not present

## 2014-08-05 ENCOUNTER — Encounter: Payer: Self-pay | Admitting: *Deleted

## 2014-08-05 DIAGNOSIS — F329 Major depressive disorder, single episode, unspecified: Secondary | ICD-10-CM | POA: Diagnosis not present

## 2014-08-06 DIAGNOSIS — F329 Major depressive disorder, single episode, unspecified: Secondary | ICD-10-CM | POA: Diagnosis not present

## 2014-08-07 DIAGNOSIS — F329 Major depressive disorder, single episode, unspecified: Secondary | ICD-10-CM | POA: Diagnosis not present

## 2014-08-08 DIAGNOSIS — F329 Major depressive disorder, single episode, unspecified: Secondary | ICD-10-CM | POA: Diagnosis not present

## 2014-08-09 DIAGNOSIS — F329 Major depressive disorder, single episode, unspecified: Secondary | ICD-10-CM | POA: Diagnosis not present

## 2014-08-10 DIAGNOSIS — F329 Major depressive disorder, single episode, unspecified: Secondary | ICD-10-CM | POA: Diagnosis not present

## 2014-08-11 DIAGNOSIS — F329 Major depressive disorder, single episode, unspecified: Secondary | ICD-10-CM | POA: Diagnosis not present

## 2014-08-12 DIAGNOSIS — F329 Major depressive disorder, single episode, unspecified: Secondary | ICD-10-CM | POA: Diagnosis not present

## 2014-08-13 DIAGNOSIS — F329 Major depressive disorder, single episode, unspecified: Secondary | ICD-10-CM | POA: Diagnosis not present

## 2014-08-14 DIAGNOSIS — F329 Major depressive disorder, single episode, unspecified: Secondary | ICD-10-CM | POA: Diagnosis not present

## 2014-08-15 DIAGNOSIS — F329 Major depressive disorder, single episode, unspecified: Secondary | ICD-10-CM | POA: Diagnosis not present

## 2014-08-16 DIAGNOSIS — F329 Major depressive disorder, single episode, unspecified: Secondary | ICD-10-CM | POA: Diagnosis not present

## 2014-08-17 ENCOUNTER — Ambulatory Visit: Payer: Medicare HMO | Admitting: Medical

## 2014-08-17 DIAGNOSIS — F329 Major depressive disorder, single episode, unspecified: Secondary | ICD-10-CM | POA: Diagnosis not present

## 2014-08-18 DIAGNOSIS — F329 Major depressive disorder, single episode, unspecified: Secondary | ICD-10-CM | POA: Diagnosis not present

## 2014-08-19 ENCOUNTER — Telehealth: Payer: Self-pay | Admitting: Family Medicine

## 2014-08-19 NOTE — Telephone Encounter (Signed)
Could you please help me with this patients DX for Daub

## 2014-08-27 ENCOUNTER — Telehealth: Payer: Self-pay

## 2014-08-27 DIAGNOSIS — H524 Presbyopia: Secondary | ICD-10-CM | POA: Diagnosis not present

## 2014-08-27 DIAGNOSIS — H52209 Unspecified astigmatism, unspecified eye: Secondary | ICD-10-CM | POA: Diagnosis not present

## 2014-08-27 DIAGNOSIS — H5210 Myopia, unspecified eye: Secondary | ICD-10-CM | POA: Diagnosis not present

## 2014-08-27 DIAGNOSIS — H5203 Hypermetropia, bilateral: Secondary | ICD-10-CM | POA: Diagnosis not present

## 2014-08-27 DIAGNOSIS — Z0100 Encounter for examination of eyes and vision without abnormal findings: Secondary | ICD-10-CM | POA: Diagnosis not present

## 2014-08-27 NOTE — Telephone Encounter (Signed)
Dr. Cleta Alberts, do you remember completing a form for this patient from [facility]? Rivka Barbara stated that form was faxed to [place] on the 19th. I did not see the form scanned into Epic and it's not in the scan box. It may have been sent for scanning already but I didn't know if you've completed it already or not.

## 2014-08-27 NOTE — Telephone Encounter (Signed)
Tracy Barton from [facility] called regarding a form that was faxed on 08/21/14. She is checking status. I will look for form and call her back at [phone].

## 2014-08-27 NOTE — Telephone Encounter (Signed)
Please check with Antonietta Jewel about this she was helping with this completion of form.

## 2014-08-28 ENCOUNTER — Telehealth: Payer: Self-pay | Admitting: Family Medicine

## 2014-08-28 NOTE — Telephone Encounter (Signed)
i called Tracy Barton and advised her that i faxed the forms in that day when someone gave me the fax number plus i sent copy to patients home address and sent forms into the scan center

## 2014-09-04 NOTE — Telephone Encounter (Signed)
Angie, do you know if this form was completed?

## 2014-09-05 ENCOUNTER — Telehealth: Payer: Self-pay | Admitting: Family Medicine

## 2014-09-05 NOTE — Telephone Encounter (Signed)
Spoke with patient i let patient know that i had spoken with glenda around the 26-28 of Aug to let her know that i faxed over sheets and i sent a copy to scan center and sent a copy to patients address

## 2014-09-11 ENCOUNTER — Ambulatory Visit (INDEPENDENT_AMBULATORY_CARE_PROVIDER_SITE_OTHER): Payer: Commercial Managed Care - HMO | Admitting: Emergency Medicine

## 2014-09-11 VITALS — BP 148/70 | HR 53 | Temp 98.4°F | Resp 16 | Ht 63.0 in | Wt 108.0 lb

## 2014-09-11 DIAGNOSIS — S0990XS Unspecified injury of head, sequela: Secondary | ICD-10-CM

## 2014-09-11 DIAGNOSIS — R001 Bradycardia, unspecified: Secondary | ICD-10-CM | POA: Diagnosis not present

## 2014-09-11 DIAGNOSIS — S0990XA Unspecified injury of head, initial encounter: Secondary | ICD-10-CM | POA: Insufficient documentation

## 2014-09-11 DIAGNOSIS — B182 Chronic viral hepatitis C: Secondary | ICD-10-CM | POA: Diagnosis not present

## 2014-09-11 DIAGNOSIS — F0390 Unspecified dementia without behavioral disturbance: Secondary | ICD-10-CM | POA: Diagnosis not present

## 2014-09-11 DIAGNOSIS — Z23 Encounter for immunization: Secondary | ICD-10-CM | POA: Diagnosis not present

## 2014-09-11 DIAGNOSIS — G500 Trigeminal neuralgia: Secondary | ICD-10-CM

## 2014-09-11 DIAGNOSIS — S0990XD Unspecified injury of head, subsequent encounter: Secondary | ICD-10-CM | POA: Diagnosis not present

## 2014-09-11 LAB — TSH: TSH: 1.086 u[IU]/mL (ref 0.350–4.500)

## 2014-09-11 LAB — POCT CBC
Granulocyte percent: 48.4 %G (ref 37–80)
HCT, POC: 42.8 % (ref 37.7–47.9)
Hemoglobin: 13.6 g/dL (ref 12.2–16.2)
Lymph, poc: 2.7 (ref 0.6–3.4)
MCH, POC: 27 pg (ref 27–31.2)
MCHC: 31.8 g/dL (ref 31.8–35.4)
MCV: 84.8 fL (ref 80–97)
MID (cbc): 0.4 (ref 0–0.9)
MPV: 7.2 fL (ref 0–99.8)
POC Granulocyte: 2.9 (ref 2–6.9)
POC LYMPH PERCENT: 45.2 %L (ref 10–50)
POC MID %: 6.4 %M (ref 0–12)
Platelet Count, POC: 314 10*3/uL (ref 142–424)
RBC: 5.04 M/uL (ref 4.04–5.48)
RDW, POC: 12.5 %
WBC: 6 10*3/uL (ref 4.6–10.2)

## 2014-09-11 LAB — VITAMIN B12: Vitamin B-12: 614 pg/mL (ref 211–911)

## 2014-09-11 LAB — POCT SEDIMENTATION RATE: POCT SED RATE: 13 mm/hr (ref 0–22)

## 2014-09-11 NOTE — Patient Instructions (Signed)
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
## 2014-09-12 ENCOUNTER — Other Ambulatory Visit: Payer: Self-pay | Admitting: Emergency Medicine

## 2014-09-12 DIAGNOSIS — B182 Chronic viral hepatitis C: Secondary | ICD-10-CM

## 2014-09-12 LAB — HIV ANTIBODY (ROUTINE TESTING W REFLEX): HIV 1&2 Ab, 4th Generation: NONREACTIVE

## 2014-09-12 LAB — RPR

## 2014-09-12 LAB — HEPATITIS C ANTIBODY: HCV Ab: REACTIVE — AB

## 2014-09-14 LAB — HEPATITIS C RNA QUANTITATIVE: HCV Quantitative: NOT DETECTED IU/mL (ref <15)

## 2014-09-15 ENCOUNTER — Encounter: Payer: Self-pay | Admitting: Family Medicine

## 2014-09-18 ENCOUNTER — Inpatient Hospital Stay: Admission: RE | Admit: 2014-09-18 | Payer: Medicare HMO | Source: Ambulatory Visit

## 2014-09-21 ENCOUNTER — Telehealth: Payer: Self-pay

## 2014-09-21 ENCOUNTER — Other Ambulatory Visit: Payer: Medicare HMO

## 2014-09-21 DIAGNOSIS — B1710 Acute hepatitis C without hepatic coma: Secondary | ICD-10-CM

## 2014-09-21 LAB — COMPREHENSIVE METABOLIC PANEL
ALT: 11 U/L (ref 6–29)
AST: 19 U/L (ref 10–35)
Albumin: 4.3 g/dL (ref 3.6–5.1)
Alkaline Phosphatase: 75 U/L (ref 33–130)
BUN: 18 mg/dL (ref 7–25)
CO2: 29 mmol/L (ref 20–31)
Calcium: 10.2 mg/dL (ref 8.6–10.4)
Chloride: 105 mmol/L (ref 98–110)
Creat: 0.86 mg/dL (ref 0.50–0.99)
Glucose, Bld: 75 mg/dL (ref 65–99)
Potassium: 5.4 mmol/L — ABNORMAL HIGH (ref 3.5–5.3)
Sodium: 142 mmol/L (ref 135–146)
Total Bilirubin: 0.3 mg/dL (ref 0.2–1.2)
Total Protein: 6.6 g/dL (ref 6.1–8.1)

## 2014-09-21 LAB — CBC WITH DIFFERENTIAL/PLATELET
Basophils Absolute: 0 10*3/uL (ref 0.0–0.1)
Basophils Relative: 0 % (ref 0–1)
Eosinophils Absolute: 0.2 10*3/uL (ref 0.0–0.7)
Eosinophils Relative: 3 % (ref 0–5)
HCT: 44 % (ref 36.0–46.0)
Hemoglobin: 14.7 g/dL (ref 12.0–15.0)
Lymphocytes Relative: 47 % — ABNORMAL HIGH (ref 12–46)
Lymphs Abs: 2.6 10*3/uL (ref 0.7–4.0)
MCH: 28.5 pg (ref 26.0–34.0)
MCHC: 33.4 g/dL (ref 30.0–36.0)
MCV: 85.3 fL (ref 78.0–100.0)
MPV: 10.4 fL (ref 8.6–12.4)
Monocytes Absolute: 0.4 10*3/uL (ref 0.1–1.0)
Monocytes Relative: 8 % (ref 3–12)
Neutro Abs: 2.3 10*3/uL (ref 1.7–7.7)
Neutrophils Relative %: 42 % — ABNORMAL LOW (ref 43–77)
Platelets: 317 10*3/uL (ref 150–400)
RBC: 5.16 MIL/uL — ABNORMAL HIGH (ref 3.87–5.11)
RDW: 13.2 % (ref 11.5–15.5)
WBC: 5.5 10*3/uL (ref 4.0–10.5)

## 2014-09-21 LAB — IRON: Iron: 75 ug/dL (ref 45–160)

## 2014-09-21 LAB — PROTIME-INR
INR: 0.89 (ref <1.50)
Prothrombin Time: 12.1 seconds (ref 11.6–15.2)

## 2014-09-21 NOTE — Telephone Encounter (Signed)
Patient is checking on the status of her request for Dr. Cleta Alberts to complete paperwork for [facility]  Please call patient at [phone]

## 2014-09-22 LAB — HEPATITIS A ANTIBODY, TOTAL: Hep A Total Ab: REACTIVE — AB

## 2014-09-22 LAB — HEPATITIS B SURFACE ANTIBODY,QUALITATIVE: Hep B S Ab: POSITIVE — AB

## 2014-09-22 LAB — HEPATITIS B SURFACE ANTIGEN: Hepatitis B Surface Ag: NEGATIVE

## 2014-09-22 LAB — ANA: Anti Nuclear Antibody(ANA): NEGATIVE

## 2014-09-22 LAB — HEPATITIS B CORE ANTIBODY, TOTAL: Hep B Core Total Ab: REACTIVE — AB

## 2014-09-22 NOTE — Telephone Encounter (Signed)
Left message for pt to call back  °

## 2014-09-22 NOTE — Telephone Encounter (Signed)
Her paperwork I believe was completed when she was here. I don't know of any new paperwork. Please let me know and call to the patient. She is currently living at [facility].

## 2014-09-23 ENCOUNTER — Telehealth: Payer: Self-pay

## 2014-09-23 LAB — HEPATITIS C GENOTYPE

## 2014-09-23 NOTE — Telephone Encounter (Signed)
Pt would like to know if we would fax the papers to [facility] for her. States she isn't sure where they are Doesn't know the fax number but you may call pt at [phone] if needed

## 2014-09-24 NOTE — Telephone Encounter (Signed)
See my previous message, I was just informing her that we do have her papers from [place] and once the provider fills his portion out we will have it faxed right over.

## 2014-09-24 NOTE — Telephone Encounter (Signed)
Left message for pt to call back  °

## 2014-09-24 NOTE — Telephone Encounter (Signed)
Called Pt whoever answered the phone stated that she was asleep and I told her just have her call us back.

## 2014-09-25 NOTE — Telephone Encounter (Signed)
In your box, you may have done this already.

## 2014-10-06 ENCOUNTER — Encounter: Payer: Self-pay | Admitting: Emergency Medicine

## 2014-10-16 NOTE — Telephone Encounter (Signed)
Patient is calling because the fax has never been sent to the company and it's been almost a month. Patient said that she will call the [employer] to see if they can sent another form by fax. Please look out for fax!

## 2014-10-20 NOTE — Telephone Encounter (Signed)
Can we please look out for this form.

## 2014-10-21 NOTE — Telephone Encounter (Signed)
Haven't seen any forms as of 10/21/14

## 2014-10-22 NOTE — Telephone Encounter (Signed)
Morning faxes processed and nothing seen for this patient.

## 2014-10-23 NOTE — Telephone Encounter (Signed)
[facility]acy Barton, medical records has not received anything for this patient.

## 2014-10-27 ENCOUNTER — Encounter: Payer: Medicare HMO | Admitting: Internal Medicine

## 2014-11-23 DIAGNOSIS — F0390 Unspecified dementia without behavioral disturbance: Secondary | ICD-10-CM | POA: Diagnosis not present

## 2014-11-24 DIAGNOSIS — F0390 Unspecified dementia without behavioral disturbance: Secondary | ICD-10-CM | POA: Diagnosis not present

## 2014-11-25 DIAGNOSIS — F0390 Unspecified dementia without behavioral disturbance: Secondary | ICD-10-CM | POA: Diagnosis not present

## 2014-11-26 DIAGNOSIS — F0390 Unspecified dementia without behavioral disturbance: Secondary | ICD-10-CM | POA: Diagnosis not present

## 2014-11-27 DIAGNOSIS — F0390 Unspecified dementia without behavioral disturbance: Secondary | ICD-10-CM | POA: Diagnosis not present

## 2014-11-28 DIAGNOSIS — F0390 Unspecified dementia without behavioral disturbance: Secondary | ICD-10-CM | POA: Diagnosis not present

## 2014-11-29 DIAGNOSIS — F0390 Unspecified dementia without behavioral disturbance: Secondary | ICD-10-CM | POA: Diagnosis not present

## 2014-11-30 DIAGNOSIS — F0390 Unspecified dementia without behavioral disturbance: Secondary | ICD-10-CM | POA: Diagnosis not present

## 2014-12-01 DIAGNOSIS — F0390 Unspecified dementia without behavioral disturbance: Secondary | ICD-10-CM | POA: Diagnosis not present

## 2014-12-02 DIAGNOSIS — F0390 Unspecified dementia without behavioral disturbance: Secondary | ICD-10-CM | POA: Diagnosis not present

## 2014-12-03 DIAGNOSIS — F0390 Unspecified dementia without behavioral disturbance: Secondary | ICD-10-CM | POA: Diagnosis not present

## 2014-12-04 DIAGNOSIS — F0390 Unspecified dementia without behavioral disturbance: Secondary | ICD-10-CM | POA: Diagnosis not present

## 2014-12-05 DIAGNOSIS — F0390 Unspecified dementia without behavioral disturbance: Secondary | ICD-10-CM | POA: Diagnosis not present

## 2014-12-06 DIAGNOSIS — F0390 Unspecified dementia without behavioral disturbance: Secondary | ICD-10-CM | POA: Diagnosis not present

## 2014-12-07 DIAGNOSIS — F0390 Unspecified dementia without behavioral disturbance: Secondary | ICD-10-CM | POA: Diagnosis not present

## 2014-12-08 DIAGNOSIS — F0390 Unspecified dementia without behavioral disturbance: Secondary | ICD-10-CM | POA: Diagnosis not present

## 2014-12-09 DIAGNOSIS — F0390 Unspecified dementia without behavioral disturbance: Secondary | ICD-10-CM | POA: Diagnosis not present

## 2014-12-10 DIAGNOSIS — F0390 Unspecified dementia without behavioral disturbance: Secondary | ICD-10-CM | POA: Diagnosis not present

## 2014-12-11 DIAGNOSIS — F0390 Unspecified dementia without behavioral disturbance: Secondary | ICD-10-CM | POA: Diagnosis not present

## 2014-12-12 DIAGNOSIS — F0390 Unspecified dementia without behavioral disturbance: Secondary | ICD-10-CM | POA: Diagnosis not present

## 2014-12-13 DIAGNOSIS — F0390 Unspecified dementia without behavioral disturbance: Secondary | ICD-10-CM | POA: Diagnosis not present

## 2014-12-14 DIAGNOSIS — F0390 Unspecified dementia without behavioral disturbance: Secondary | ICD-10-CM | POA: Diagnosis not present

## 2014-12-15 DIAGNOSIS — F0390 Unspecified dementia without behavioral disturbance: Secondary | ICD-10-CM | POA: Diagnosis not present

## 2014-12-16 DIAGNOSIS — F0390 Unspecified dementia without behavioral disturbance: Secondary | ICD-10-CM | POA: Diagnosis not present

## 2014-12-17 DIAGNOSIS — F0390 Unspecified dementia without behavioral disturbance: Secondary | ICD-10-CM | POA: Diagnosis not present

## 2014-12-18 DIAGNOSIS — F0390 Unspecified dementia without behavioral disturbance: Secondary | ICD-10-CM | POA: Diagnosis not present

## 2014-12-19 DIAGNOSIS — F0390 Unspecified dementia without behavioral disturbance: Secondary | ICD-10-CM | POA: Diagnosis not present

## 2014-12-20 DIAGNOSIS — F0390 Unspecified dementia without behavioral disturbance: Secondary | ICD-10-CM | POA: Diagnosis not present

## 2014-12-21 DIAGNOSIS — F0390 Unspecified dementia without behavioral disturbance: Secondary | ICD-10-CM | POA: Diagnosis not present

## 2014-12-22 DIAGNOSIS — F0390 Unspecified dementia without behavioral disturbance: Secondary | ICD-10-CM | POA: Diagnosis not present

## 2014-12-23 DIAGNOSIS — F0390 Unspecified dementia without behavioral disturbance: Secondary | ICD-10-CM | POA: Diagnosis not present

## 2014-12-24 DIAGNOSIS — F0390 Unspecified dementia without behavioral disturbance: Secondary | ICD-10-CM | POA: Diagnosis not present

## 2014-12-25 DIAGNOSIS — F0390 Unspecified dementia without behavioral disturbance: Secondary | ICD-10-CM | POA: Diagnosis not present

## 2014-12-26 DIAGNOSIS — F0390 Unspecified dementia without behavioral disturbance: Secondary | ICD-10-CM | POA: Diagnosis not present

## 2014-12-27 DIAGNOSIS — F0390 Unspecified dementia without behavioral disturbance: Secondary | ICD-10-CM | POA: Diagnosis not present

## 2014-12-28 DIAGNOSIS — F0390 Unspecified dementia without behavioral disturbance: Secondary | ICD-10-CM | POA: Diagnosis not present

## 2014-12-29 DIAGNOSIS — F0390 Unspecified dementia without behavioral disturbance: Secondary | ICD-10-CM | POA: Diagnosis not present

## 2014-12-30 DIAGNOSIS — F0390 Unspecified dementia without behavioral disturbance: Secondary | ICD-10-CM | POA: Diagnosis not present

## 2014-12-31 DIAGNOSIS — F0390 Unspecified dementia without behavioral disturbance: Secondary | ICD-10-CM | POA: Diagnosis not present

## 2015-01-01 DIAGNOSIS — F0390 Unspecified dementia without behavioral disturbance: Secondary | ICD-10-CM | POA: Diagnosis not present

## 2015-01-02 DIAGNOSIS — F0390 Unspecified dementia without behavioral disturbance: Secondary | ICD-10-CM | POA: Diagnosis not present

## 2015-01-03 DIAGNOSIS — F0390 Unspecified dementia without behavioral disturbance: Secondary | ICD-10-CM | POA: Diagnosis not present

## 2015-01-04 DIAGNOSIS — F0390 Unspecified dementia without behavioral disturbance: Secondary | ICD-10-CM | POA: Diagnosis not present

## 2015-01-05 DIAGNOSIS — F0390 Unspecified dementia without behavioral disturbance: Secondary | ICD-10-CM | POA: Diagnosis not present

## 2015-01-06 DIAGNOSIS — F0390 Unspecified dementia without behavioral disturbance: Secondary | ICD-10-CM | POA: Diagnosis not present

## 2015-01-07 DIAGNOSIS — F0390 Unspecified dementia without behavioral disturbance: Secondary | ICD-10-CM | POA: Diagnosis not present

## 2015-01-08 DIAGNOSIS — F0390 Unspecified dementia without behavioral disturbance: Secondary | ICD-10-CM | POA: Diagnosis not present

## 2015-01-09 DIAGNOSIS — F0390 Unspecified dementia without behavioral disturbance: Secondary | ICD-10-CM | POA: Diagnosis not present

## 2015-01-10 DIAGNOSIS — F0390 Unspecified dementia without behavioral disturbance: Secondary | ICD-10-CM | POA: Diagnosis not present

## 2015-01-11 DIAGNOSIS — F0390 Unspecified dementia without behavioral disturbance: Secondary | ICD-10-CM | POA: Diagnosis not present

## 2015-01-12 DIAGNOSIS — F0390 Unspecified dementia without behavioral disturbance: Secondary | ICD-10-CM | POA: Diagnosis not present

## 2015-01-13 DIAGNOSIS — F0390 Unspecified dementia without behavioral disturbance: Secondary | ICD-10-CM | POA: Diagnosis not present

## 2015-01-14 DIAGNOSIS — F0390 Unspecified dementia without behavioral disturbance: Secondary | ICD-10-CM | POA: Diagnosis not present

## 2015-01-15 DIAGNOSIS — F0390 Unspecified dementia without behavioral disturbance: Secondary | ICD-10-CM | POA: Diagnosis not present

## 2015-01-16 DIAGNOSIS — F0390 Unspecified dementia without behavioral disturbance: Secondary | ICD-10-CM | POA: Diagnosis not present

## 2015-01-17 DIAGNOSIS — F0390 Unspecified dementia without behavioral disturbance: Secondary | ICD-10-CM | POA: Diagnosis not present

## 2015-01-18 DIAGNOSIS — F0390 Unspecified dementia without behavioral disturbance: Secondary | ICD-10-CM | POA: Diagnosis not present

## 2015-01-19 DIAGNOSIS — F0390 Unspecified dementia without behavioral disturbance: Secondary | ICD-10-CM | POA: Diagnosis not present

## 2015-01-20 DIAGNOSIS — F0390 Unspecified dementia without behavioral disturbance: Secondary | ICD-10-CM | POA: Diagnosis not present

## 2015-01-21 DIAGNOSIS — F0390 Unspecified dementia without behavioral disturbance: Secondary | ICD-10-CM | POA: Diagnosis not present

## 2015-01-22 DIAGNOSIS — F0390 Unspecified dementia without behavioral disturbance: Secondary | ICD-10-CM | POA: Diagnosis not present

## 2015-01-23 DIAGNOSIS — F0390 Unspecified dementia without behavioral disturbance: Secondary | ICD-10-CM | POA: Diagnosis not present

## 2015-01-24 DIAGNOSIS — F0390 Unspecified dementia without behavioral disturbance: Secondary | ICD-10-CM | POA: Diagnosis not present

## 2015-01-25 DIAGNOSIS — F0390 Unspecified dementia without behavioral disturbance: Secondary | ICD-10-CM | POA: Diagnosis not present

## 2015-01-26 DIAGNOSIS — F0390 Unspecified dementia without behavioral disturbance: Secondary | ICD-10-CM | POA: Diagnosis not present

## 2015-01-27 DIAGNOSIS — F0390 Unspecified dementia without behavioral disturbance: Secondary | ICD-10-CM | POA: Diagnosis not present

## 2015-01-28 DIAGNOSIS — F0390 Unspecified dementia without behavioral disturbance: Secondary | ICD-10-CM | POA: Diagnosis not present

## 2015-01-29 DIAGNOSIS — F0390 Unspecified dementia without behavioral disturbance: Secondary | ICD-10-CM | POA: Diagnosis not present

## 2015-01-30 DIAGNOSIS — F0390 Unspecified dementia without behavioral disturbance: Secondary | ICD-10-CM | POA: Diagnosis not present

## 2015-01-31 DIAGNOSIS — F0390 Unspecified dementia without behavioral disturbance: Secondary | ICD-10-CM | POA: Diagnosis not present

## 2015-02-01 DIAGNOSIS — F0390 Unspecified dementia without behavioral disturbance: Secondary | ICD-10-CM | POA: Diagnosis not present

## 2015-02-02 DIAGNOSIS — F0390 Unspecified dementia without behavioral disturbance: Secondary | ICD-10-CM | POA: Diagnosis not present

## 2015-02-03 DIAGNOSIS — F0390 Unspecified dementia without behavioral disturbance: Secondary | ICD-10-CM | POA: Diagnosis not present

## 2015-02-04 DIAGNOSIS — F0390 Unspecified dementia without behavioral disturbance: Secondary | ICD-10-CM | POA: Diagnosis not present

## 2015-02-05 DIAGNOSIS — F0390 Unspecified dementia without behavioral disturbance: Secondary | ICD-10-CM | POA: Diagnosis not present

## 2015-02-06 DIAGNOSIS — F0390 Unspecified dementia without behavioral disturbance: Secondary | ICD-10-CM | POA: Diagnosis not present

## 2015-02-07 DIAGNOSIS — F0390 Unspecified dementia without behavioral disturbance: Secondary | ICD-10-CM | POA: Diagnosis not present

## 2015-02-08 DIAGNOSIS — F0390 Unspecified dementia without behavioral disturbance: Secondary | ICD-10-CM | POA: Diagnosis not present

## 2015-02-09 DIAGNOSIS — F0390 Unspecified dementia without behavioral disturbance: Secondary | ICD-10-CM | POA: Diagnosis not present

## 2015-02-10 DIAGNOSIS — F0390 Unspecified dementia without behavioral disturbance: Secondary | ICD-10-CM | POA: Diagnosis not present

## 2015-02-11 DIAGNOSIS — F0390 Unspecified dementia without behavioral disturbance: Secondary | ICD-10-CM | POA: Diagnosis not present

## 2015-02-12 DIAGNOSIS — F0390 Unspecified dementia without behavioral disturbance: Secondary | ICD-10-CM | POA: Diagnosis not present

## 2015-02-13 DIAGNOSIS — F0390 Unspecified dementia without behavioral disturbance: Secondary | ICD-10-CM | POA: Diagnosis not present

## 2015-02-14 DIAGNOSIS — F0390 Unspecified dementia without behavioral disturbance: Secondary | ICD-10-CM | POA: Diagnosis not present

## 2015-02-15 DIAGNOSIS — F0390 Unspecified dementia without behavioral disturbance: Secondary | ICD-10-CM | POA: Diagnosis not present

## 2015-02-16 DIAGNOSIS — F0390 Unspecified dementia without behavioral disturbance: Secondary | ICD-10-CM | POA: Diagnosis not present

## 2015-02-17 DIAGNOSIS — F0390 Unspecified dementia without behavioral disturbance: Secondary | ICD-10-CM | POA: Diagnosis not present

## 2015-02-18 DIAGNOSIS — F0390 Unspecified dementia without behavioral disturbance: Secondary | ICD-10-CM | POA: Diagnosis not present

## 2015-02-19 DIAGNOSIS — F0390 Unspecified dementia without behavioral disturbance: Secondary | ICD-10-CM | POA: Diagnosis not present

## 2015-02-20 DIAGNOSIS — F0390 Unspecified dementia without behavioral disturbance: Secondary | ICD-10-CM | POA: Diagnosis not present

## 2015-02-21 DIAGNOSIS — F0390 Unspecified dementia without behavioral disturbance: Secondary | ICD-10-CM | POA: Diagnosis not present

## 2015-02-22 DIAGNOSIS — F0390 Unspecified dementia without behavioral disturbance: Secondary | ICD-10-CM | POA: Diagnosis not present

## 2015-02-23 DIAGNOSIS — F0390 Unspecified dementia without behavioral disturbance: Secondary | ICD-10-CM | POA: Diagnosis not present

## 2015-02-24 DIAGNOSIS — F0390 Unspecified dementia without behavioral disturbance: Secondary | ICD-10-CM | POA: Diagnosis not present

## 2015-02-25 DIAGNOSIS — F0390 Unspecified dementia without behavioral disturbance: Secondary | ICD-10-CM | POA: Diagnosis not present

## 2015-02-26 DIAGNOSIS — F0390 Unspecified dementia without behavioral disturbance: Secondary | ICD-10-CM | POA: Diagnosis not present

## 2015-02-27 DIAGNOSIS — F0390 Unspecified dementia without behavioral disturbance: Secondary | ICD-10-CM | POA: Diagnosis not present

## 2015-02-28 DIAGNOSIS — F0390 Unspecified dementia without behavioral disturbance: Secondary | ICD-10-CM | POA: Diagnosis not present

## 2015-03-08 DIAGNOSIS — F0390 Unspecified dementia without behavioral disturbance: Secondary | ICD-10-CM | POA: Diagnosis not present

## 2015-03-09 DIAGNOSIS — F0390 Unspecified dementia without behavioral disturbance: Secondary | ICD-10-CM | POA: Diagnosis not present

## 2015-03-10 DIAGNOSIS — F0390 Unspecified dementia without behavioral disturbance: Secondary | ICD-10-CM | POA: Diagnosis not present

## 2015-03-11 DIAGNOSIS — F0390 Unspecified dementia without behavioral disturbance: Secondary | ICD-10-CM | POA: Diagnosis not present

## 2015-03-12 DIAGNOSIS — F0390 Unspecified dementia without behavioral disturbance: Secondary | ICD-10-CM | POA: Diagnosis not present

## 2015-03-13 DIAGNOSIS — F0390 Unspecified dementia without behavioral disturbance: Secondary | ICD-10-CM | POA: Diagnosis not present

## 2015-03-14 DIAGNOSIS — F0390 Unspecified dementia without behavioral disturbance: Secondary | ICD-10-CM | POA: Diagnosis not present

## 2015-03-15 DIAGNOSIS — F0390 Unspecified dementia without behavioral disturbance: Secondary | ICD-10-CM | POA: Diagnosis not present

## 2015-03-16 DIAGNOSIS — F0390 Unspecified dementia without behavioral disturbance: Secondary | ICD-10-CM | POA: Diagnosis not present

## 2015-03-17 DIAGNOSIS — F0390 Unspecified dementia without behavioral disturbance: Secondary | ICD-10-CM | POA: Diagnosis not present

## 2015-03-18 DIAGNOSIS — F0390 Unspecified dementia without behavioral disturbance: Secondary | ICD-10-CM | POA: Diagnosis not present

## 2015-03-19 DIAGNOSIS — F0390 Unspecified dementia without behavioral disturbance: Secondary | ICD-10-CM | POA: Diagnosis not present

## 2015-03-20 DIAGNOSIS — F0390 Unspecified dementia without behavioral disturbance: Secondary | ICD-10-CM | POA: Diagnosis not present

## 2015-03-21 DIAGNOSIS — F0390 Unspecified dementia without behavioral disturbance: Secondary | ICD-10-CM | POA: Diagnosis not present

## 2015-03-22 DIAGNOSIS — F0390 Unspecified dementia without behavioral disturbance: Secondary | ICD-10-CM | POA: Diagnosis not present

## 2015-03-23 DIAGNOSIS — F0390 Unspecified dementia without behavioral disturbance: Secondary | ICD-10-CM | POA: Diagnosis not present

## 2015-03-24 DIAGNOSIS — F0390 Unspecified dementia without behavioral disturbance: Secondary | ICD-10-CM | POA: Diagnosis not present

## 2015-03-25 DIAGNOSIS — F0390 Unspecified dementia without behavioral disturbance: Secondary | ICD-10-CM | POA: Diagnosis not present

## 2015-03-26 DIAGNOSIS — F0390 Unspecified dementia without behavioral disturbance: Secondary | ICD-10-CM | POA: Diagnosis not present

## 2015-03-27 DIAGNOSIS — F0390 Unspecified dementia without behavioral disturbance: Secondary | ICD-10-CM | POA: Diagnosis not present

## 2015-03-28 DIAGNOSIS — F0390 Unspecified dementia without behavioral disturbance: Secondary | ICD-10-CM | POA: Diagnosis not present

## 2015-03-29 DIAGNOSIS — F0390 Unspecified dementia without behavioral disturbance: Secondary | ICD-10-CM | POA: Diagnosis not present

## 2015-03-30 DIAGNOSIS — F0390 Unspecified dementia without behavioral disturbance: Secondary | ICD-10-CM | POA: Diagnosis not present

## 2015-03-31 DIAGNOSIS — F0390 Unspecified dementia without behavioral disturbance: Secondary | ICD-10-CM | POA: Diagnosis not present

## 2015-04-01 DIAGNOSIS — F0390 Unspecified dementia without behavioral disturbance: Secondary | ICD-10-CM | POA: Diagnosis not present

## 2015-04-02 DIAGNOSIS — F0390 Unspecified dementia without behavioral disturbance: Secondary | ICD-10-CM | POA: Diagnosis not present

## 2015-04-03 DIAGNOSIS — F0390 Unspecified dementia without behavioral disturbance: Secondary | ICD-10-CM | POA: Diagnosis not present

## 2015-04-04 DIAGNOSIS — F0390 Unspecified dementia without behavioral disturbance: Secondary | ICD-10-CM | POA: Diagnosis not present

## 2015-04-05 DIAGNOSIS — F0390 Unspecified dementia without behavioral disturbance: Secondary | ICD-10-CM | POA: Diagnosis not present

## 2015-04-06 DIAGNOSIS — F0390 Unspecified dementia without behavioral disturbance: Secondary | ICD-10-CM | POA: Diagnosis not present

## 2015-04-07 DIAGNOSIS — F0390 Unspecified dementia without behavioral disturbance: Secondary | ICD-10-CM | POA: Diagnosis not present

## 2015-04-08 DIAGNOSIS — F0390 Unspecified dementia without behavioral disturbance: Secondary | ICD-10-CM | POA: Diagnosis not present

## 2015-04-09 DIAGNOSIS — F0390 Unspecified dementia without behavioral disturbance: Secondary | ICD-10-CM | POA: Diagnosis not present

## 2015-04-10 DIAGNOSIS — F0390 Unspecified dementia without behavioral disturbance: Secondary | ICD-10-CM | POA: Diagnosis not present

## 2015-04-11 DIAGNOSIS — F0390 Unspecified dementia without behavioral disturbance: Secondary | ICD-10-CM | POA: Diagnosis not present

## 2015-04-12 DIAGNOSIS — F0390 Unspecified dementia without behavioral disturbance: Secondary | ICD-10-CM | POA: Diagnosis not present

## 2015-04-13 DIAGNOSIS — F0390 Unspecified dementia without behavioral disturbance: Secondary | ICD-10-CM | POA: Diagnosis not present

## 2015-04-14 DIAGNOSIS — F0390 Unspecified dementia without behavioral disturbance: Secondary | ICD-10-CM | POA: Diagnosis not present

## 2015-04-15 DIAGNOSIS — F0390 Unspecified dementia without behavioral disturbance: Secondary | ICD-10-CM | POA: Diagnosis not present

## 2015-04-16 DIAGNOSIS — F0390 Unspecified dementia without behavioral disturbance: Secondary | ICD-10-CM | POA: Diagnosis not present

## 2015-04-17 DIAGNOSIS — F0390 Unspecified dementia without behavioral disturbance: Secondary | ICD-10-CM | POA: Diagnosis not present

## 2015-04-18 DIAGNOSIS — F0390 Unspecified dementia without behavioral disturbance: Secondary | ICD-10-CM | POA: Diagnosis not present

## 2015-04-19 DIAGNOSIS — F0390 Unspecified dementia without behavioral disturbance: Secondary | ICD-10-CM | POA: Diagnosis not present

## 2015-04-20 DIAGNOSIS — F0390 Unspecified dementia without behavioral disturbance: Secondary | ICD-10-CM | POA: Diagnosis not present

## 2015-04-21 DIAGNOSIS — F0390 Unspecified dementia without behavioral disturbance: Secondary | ICD-10-CM | POA: Diagnosis not present

## 2015-04-22 DIAGNOSIS — F0390 Unspecified dementia without behavioral disturbance: Secondary | ICD-10-CM | POA: Diagnosis not present

## 2015-04-23 DIAGNOSIS — F0390 Unspecified dementia without behavioral disturbance: Secondary | ICD-10-CM | POA: Diagnosis not present

## 2015-04-24 DIAGNOSIS — F0390 Unspecified dementia without behavioral disturbance: Secondary | ICD-10-CM | POA: Diagnosis not present

## 2015-04-25 DIAGNOSIS — F0390 Unspecified dementia without behavioral disturbance: Secondary | ICD-10-CM | POA: Diagnosis not present

## 2015-04-26 DIAGNOSIS — F0390 Unspecified dementia without behavioral disturbance: Secondary | ICD-10-CM | POA: Diagnosis not present

## 2015-04-27 DIAGNOSIS — F0390 Unspecified dementia without behavioral disturbance: Secondary | ICD-10-CM | POA: Diagnosis not present

## 2015-04-28 DIAGNOSIS — F0390 Unspecified dementia without behavioral disturbance: Secondary | ICD-10-CM | POA: Diagnosis not present

## 2015-04-29 DIAGNOSIS — F0390 Unspecified dementia without behavioral disturbance: Secondary | ICD-10-CM | POA: Diagnosis not present

## 2015-04-30 DIAGNOSIS — F0390 Unspecified dementia without behavioral disturbance: Secondary | ICD-10-CM | POA: Diagnosis not present

## 2015-05-01 DIAGNOSIS — F0390 Unspecified dementia without behavioral disturbance: Secondary | ICD-10-CM | POA: Diagnosis not present

## 2015-05-02 DIAGNOSIS — F0390 Unspecified dementia without behavioral disturbance: Secondary | ICD-10-CM | POA: Diagnosis not present

## 2015-05-03 DIAGNOSIS — F0390 Unspecified dementia without behavioral disturbance: Secondary | ICD-10-CM | POA: Diagnosis not present

## 2015-05-04 DIAGNOSIS — F0390 Unspecified dementia without behavioral disturbance: Secondary | ICD-10-CM | POA: Diagnosis not present

## 2015-05-05 DIAGNOSIS — F0390 Unspecified dementia without behavioral disturbance: Secondary | ICD-10-CM | POA: Diagnosis not present

## 2015-05-06 DIAGNOSIS — F0390 Unspecified dementia without behavioral disturbance: Secondary | ICD-10-CM | POA: Diagnosis not present

## 2015-05-07 DIAGNOSIS — F0390 Unspecified dementia without behavioral disturbance: Secondary | ICD-10-CM | POA: Diagnosis not present

## 2015-05-08 DIAGNOSIS — F0390 Unspecified dementia without behavioral disturbance: Secondary | ICD-10-CM | POA: Diagnosis not present

## 2015-05-09 DIAGNOSIS — F0390 Unspecified dementia without behavioral disturbance: Secondary | ICD-10-CM | POA: Diagnosis not present

## 2015-05-10 DIAGNOSIS — F0390 Unspecified dementia without behavioral disturbance: Secondary | ICD-10-CM | POA: Diagnosis not present

## 2015-05-11 DIAGNOSIS — F0390 Unspecified dementia without behavioral disturbance: Secondary | ICD-10-CM | POA: Diagnosis not present

## 2015-05-12 DIAGNOSIS — F0390 Unspecified dementia without behavioral disturbance: Secondary | ICD-10-CM | POA: Diagnosis not present

## 2015-05-13 DIAGNOSIS — F0390 Unspecified dementia without behavioral disturbance: Secondary | ICD-10-CM | POA: Diagnosis not present

## 2015-05-14 DIAGNOSIS — F0390 Unspecified dementia without behavioral disturbance: Secondary | ICD-10-CM | POA: Diagnosis not present

## 2015-05-15 DIAGNOSIS — F0390 Unspecified dementia without behavioral disturbance: Secondary | ICD-10-CM | POA: Diagnosis not present

## 2015-05-16 DIAGNOSIS — F0390 Unspecified dementia without behavioral disturbance: Secondary | ICD-10-CM | POA: Diagnosis not present

## 2015-05-17 DIAGNOSIS — F0390 Unspecified dementia without behavioral disturbance: Secondary | ICD-10-CM | POA: Diagnosis not present

## 2015-05-18 DIAGNOSIS — F0390 Unspecified dementia without behavioral disturbance: Secondary | ICD-10-CM | POA: Diagnosis not present

## 2015-05-19 DIAGNOSIS — F0390 Unspecified dementia without behavioral disturbance: Secondary | ICD-10-CM | POA: Diagnosis not present

## 2015-05-20 DIAGNOSIS — F0390 Unspecified dementia without behavioral disturbance: Secondary | ICD-10-CM | POA: Diagnosis not present

## 2015-05-21 DIAGNOSIS — F0390 Unspecified dementia without behavioral disturbance: Secondary | ICD-10-CM | POA: Diagnosis not present

## 2015-05-22 DIAGNOSIS — F0390 Unspecified dementia without behavioral disturbance: Secondary | ICD-10-CM | POA: Diagnosis not present

## 2015-05-23 DIAGNOSIS — F0390 Unspecified dementia without behavioral disturbance: Secondary | ICD-10-CM | POA: Diagnosis not present

## 2015-05-24 DIAGNOSIS — F0390 Unspecified dementia without behavioral disturbance: Secondary | ICD-10-CM | POA: Diagnosis not present

## 2015-05-25 DIAGNOSIS — F0390 Unspecified dementia without behavioral disturbance: Secondary | ICD-10-CM | POA: Diagnosis not present

## 2015-05-26 DIAGNOSIS — F0390 Unspecified dementia without behavioral disturbance: Secondary | ICD-10-CM | POA: Diagnosis not present

## 2015-05-27 DIAGNOSIS — F0390 Unspecified dementia without behavioral disturbance: Secondary | ICD-10-CM | POA: Diagnosis not present

## 2015-05-28 DIAGNOSIS — F0390 Unspecified dementia without behavioral disturbance: Secondary | ICD-10-CM | POA: Diagnosis not present

## 2015-05-29 DIAGNOSIS — F0390 Unspecified dementia without behavioral disturbance: Secondary | ICD-10-CM | POA: Diagnosis not present

## 2015-05-30 DIAGNOSIS — F0390 Unspecified dementia without behavioral disturbance: Secondary | ICD-10-CM | POA: Diagnosis not present

## 2015-05-31 DIAGNOSIS — F0390 Unspecified dementia without behavioral disturbance: Secondary | ICD-10-CM | POA: Diagnosis not present

## 2015-06-01 DIAGNOSIS — F0390 Unspecified dementia without behavioral disturbance: Secondary | ICD-10-CM | POA: Diagnosis not present

## 2015-06-02 DIAGNOSIS — F0390 Unspecified dementia without behavioral disturbance: Secondary | ICD-10-CM | POA: Diagnosis not present

## 2015-06-03 DIAGNOSIS — F0390 Unspecified dementia without behavioral disturbance: Secondary | ICD-10-CM | POA: Diagnosis not present

## 2015-06-04 DIAGNOSIS — F0390 Unspecified dementia without behavioral disturbance: Secondary | ICD-10-CM | POA: Diagnosis not present

## 2015-06-05 DIAGNOSIS — F0390 Unspecified dementia without behavioral disturbance: Secondary | ICD-10-CM | POA: Diagnosis not present

## 2015-06-06 DIAGNOSIS — F0390 Unspecified dementia without behavioral disturbance: Secondary | ICD-10-CM | POA: Diagnosis not present

## 2015-06-07 DIAGNOSIS — F0390 Unspecified dementia without behavioral disturbance: Secondary | ICD-10-CM | POA: Diagnosis not present

## 2015-06-08 DIAGNOSIS — F0390 Unspecified dementia without behavioral disturbance: Secondary | ICD-10-CM | POA: Diagnosis not present

## 2015-06-09 DIAGNOSIS — F0390 Unspecified dementia without behavioral disturbance: Secondary | ICD-10-CM | POA: Diagnosis not present

## 2015-06-10 DIAGNOSIS — F0390 Unspecified dementia without behavioral disturbance: Secondary | ICD-10-CM | POA: Diagnosis not present

## 2015-06-11 DIAGNOSIS — F0390 Unspecified dementia without behavioral disturbance: Secondary | ICD-10-CM | POA: Diagnosis not present

## 2015-06-12 DIAGNOSIS — F0390 Unspecified dementia without behavioral disturbance: Secondary | ICD-10-CM | POA: Diagnosis not present

## 2015-06-13 DIAGNOSIS — F0390 Unspecified dementia without behavioral disturbance: Secondary | ICD-10-CM | POA: Diagnosis not present

## 2015-06-14 DIAGNOSIS — F0390 Unspecified dementia without behavioral disturbance: Secondary | ICD-10-CM | POA: Diagnosis not present

## 2015-06-15 DIAGNOSIS — F0390 Unspecified dementia without behavioral disturbance: Secondary | ICD-10-CM | POA: Diagnosis not present

## 2015-06-16 DIAGNOSIS — F0390 Unspecified dementia without behavioral disturbance: Secondary | ICD-10-CM | POA: Diagnosis not present

## 2015-06-17 DIAGNOSIS — F0390 Unspecified dementia without behavioral disturbance: Secondary | ICD-10-CM | POA: Diagnosis not present

## 2015-06-18 DIAGNOSIS — F0390 Unspecified dementia without behavioral disturbance: Secondary | ICD-10-CM | POA: Diagnosis not present

## 2015-06-19 DIAGNOSIS — F0390 Unspecified dementia without behavioral disturbance: Secondary | ICD-10-CM | POA: Diagnosis not present

## 2015-06-20 DIAGNOSIS — F0390 Unspecified dementia without behavioral disturbance: Secondary | ICD-10-CM | POA: Diagnosis not present

## 2015-06-21 DIAGNOSIS — F0390 Unspecified dementia without behavioral disturbance: Secondary | ICD-10-CM | POA: Diagnosis not present

## 2015-06-22 DIAGNOSIS — F0390 Unspecified dementia without behavioral disturbance: Secondary | ICD-10-CM | POA: Diagnosis not present

## 2015-06-23 DIAGNOSIS — F0390 Unspecified dementia without behavioral disturbance: Secondary | ICD-10-CM | POA: Diagnosis not present

## 2015-06-24 DIAGNOSIS — F0390 Unspecified dementia without behavioral disturbance: Secondary | ICD-10-CM | POA: Diagnosis not present

## 2015-06-25 DIAGNOSIS — F0390 Unspecified dementia without behavioral disturbance: Secondary | ICD-10-CM | POA: Diagnosis not present

## 2015-06-26 DIAGNOSIS — F0390 Unspecified dementia without behavioral disturbance: Secondary | ICD-10-CM | POA: Diagnosis not present

## 2015-06-27 DIAGNOSIS — F0390 Unspecified dementia without behavioral disturbance: Secondary | ICD-10-CM | POA: Diagnosis not present

## 2015-06-28 DIAGNOSIS — F0390 Unspecified dementia without behavioral disturbance: Secondary | ICD-10-CM | POA: Diagnosis not present

## 2015-06-29 DIAGNOSIS — F0390 Unspecified dementia without behavioral disturbance: Secondary | ICD-10-CM | POA: Diagnosis not present

## 2015-06-30 DIAGNOSIS — F0390 Unspecified dementia without behavioral disturbance: Secondary | ICD-10-CM | POA: Diagnosis not present

## 2015-07-01 DIAGNOSIS — F0390 Unspecified dementia without behavioral disturbance: Secondary | ICD-10-CM | POA: Diagnosis not present

## 2015-07-02 DIAGNOSIS — F0390 Unspecified dementia without behavioral disturbance: Secondary | ICD-10-CM | POA: Diagnosis not present

## 2015-07-03 DIAGNOSIS — F0390 Unspecified dementia without behavioral disturbance: Secondary | ICD-10-CM | POA: Diagnosis not present

## 2015-07-04 DIAGNOSIS — F0390 Unspecified dementia without behavioral disturbance: Secondary | ICD-10-CM | POA: Diagnosis not present

## 2015-07-05 DIAGNOSIS — F0390 Unspecified dementia without behavioral disturbance: Secondary | ICD-10-CM | POA: Diagnosis not present

## 2015-07-06 DIAGNOSIS — F0390 Unspecified dementia without behavioral disturbance: Secondary | ICD-10-CM | POA: Diagnosis not present

## 2015-07-07 DIAGNOSIS — F0390 Unspecified dementia without behavioral disturbance: Secondary | ICD-10-CM | POA: Diagnosis not present

## 2015-07-08 DIAGNOSIS — F0390 Unspecified dementia without behavioral disturbance: Secondary | ICD-10-CM | POA: Diagnosis not present

## 2015-07-09 DIAGNOSIS — F0390 Unspecified dementia without behavioral disturbance: Secondary | ICD-10-CM | POA: Diagnosis not present

## 2015-07-10 DIAGNOSIS — F0390 Unspecified dementia without behavioral disturbance: Secondary | ICD-10-CM | POA: Diagnosis not present

## 2015-07-11 DIAGNOSIS — F0390 Unspecified dementia without behavioral disturbance: Secondary | ICD-10-CM | POA: Diagnosis not present

## 2015-07-12 DIAGNOSIS — F0390 Unspecified dementia without behavioral disturbance: Secondary | ICD-10-CM | POA: Diagnosis not present

## 2015-07-13 DIAGNOSIS — F0390 Unspecified dementia without behavioral disturbance: Secondary | ICD-10-CM | POA: Diagnosis not present

## 2015-07-14 DIAGNOSIS — F0390 Unspecified dementia without behavioral disturbance: Secondary | ICD-10-CM | POA: Diagnosis not present

## 2015-07-15 DIAGNOSIS — F0390 Unspecified dementia without behavioral disturbance: Secondary | ICD-10-CM | POA: Diagnosis not present

## 2015-07-16 DIAGNOSIS — F0390 Unspecified dementia without behavioral disturbance: Secondary | ICD-10-CM | POA: Diagnosis not present

## 2015-07-17 DIAGNOSIS — F0390 Unspecified dementia without behavioral disturbance: Secondary | ICD-10-CM | POA: Diagnosis not present

## 2015-07-18 DIAGNOSIS — F0390 Unspecified dementia without behavioral disturbance: Secondary | ICD-10-CM | POA: Diagnosis not present

## 2015-07-19 DIAGNOSIS — F0390 Unspecified dementia without behavioral disturbance: Secondary | ICD-10-CM | POA: Diagnosis not present

## 2015-07-20 DIAGNOSIS — F0390 Unspecified dementia without behavioral disturbance: Secondary | ICD-10-CM | POA: Diagnosis not present

## 2015-07-21 DIAGNOSIS — F0390 Unspecified dementia without behavioral disturbance: Secondary | ICD-10-CM | POA: Diagnosis not present

## 2015-07-22 DIAGNOSIS — F0390 Unspecified dementia without behavioral disturbance: Secondary | ICD-10-CM | POA: Diagnosis not present

## 2015-07-23 DIAGNOSIS — F0390 Unspecified dementia without behavioral disturbance: Secondary | ICD-10-CM | POA: Diagnosis not present

## 2015-07-24 DIAGNOSIS — F0390 Unspecified dementia without behavioral disturbance: Secondary | ICD-10-CM | POA: Diagnosis not present

## 2015-07-25 DIAGNOSIS — F0390 Unspecified dementia without behavioral disturbance: Secondary | ICD-10-CM | POA: Diagnosis not present

## 2015-07-26 DIAGNOSIS — F0390 Unspecified dementia without behavioral disturbance: Secondary | ICD-10-CM | POA: Diagnosis not present

## 2015-07-27 DIAGNOSIS — F0390 Unspecified dementia without behavioral disturbance: Secondary | ICD-10-CM | POA: Diagnosis not present

## 2015-07-28 DIAGNOSIS — F0390 Unspecified dementia without behavioral disturbance: Secondary | ICD-10-CM | POA: Diagnosis not present

## 2015-07-29 DIAGNOSIS — F0390 Unspecified dementia without behavioral disturbance: Secondary | ICD-10-CM | POA: Diagnosis not present

## 2015-07-30 DIAGNOSIS — F0390 Unspecified dementia without behavioral disturbance: Secondary | ICD-10-CM | POA: Diagnosis not present

## 2015-07-31 DIAGNOSIS — F0390 Unspecified dementia without behavioral disturbance: Secondary | ICD-10-CM | POA: Diagnosis not present

## 2015-08-01 DIAGNOSIS — F0390 Unspecified dementia without behavioral disturbance: Secondary | ICD-10-CM | POA: Diagnosis not present

## 2015-08-02 DIAGNOSIS — F0390 Unspecified dementia without behavioral disturbance: Secondary | ICD-10-CM | POA: Diagnosis not present

## 2015-08-03 DIAGNOSIS — F0390 Unspecified dementia without behavioral disturbance: Secondary | ICD-10-CM | POA: Diagnosis not present

## 2015-08-04 DIAGNOSIS — F0390 Unspecified dementia without behavioral disturbance: Secondary | ICD-10-CM | POA: Diagnosis not present

## 2015-08-05 DIAGNOSIS — F0390 Unspecified dementia without behavioral disturbance: Secondary | ICD-10-CM | POA: Diagnosis not present

## 2015-08-06 DIAGNOSIS — F0390 Unspecified dementia without behavioral disturbance: Secondary | ICD-10-CM | POA: Diagnosis not present

## 2015-08-07 DIAGNOSIS — F0390 Unspecified dementia without behavioral disturbance: Secondary | ICD-10-CM | POA: Diagnosis not present

## 2015-08-08 DIAGNOSIS — F0390 Unspecified dementia without behavioral disturbance: Secondary | ICD-10-CM | POA: Diagnosis not present

## 2015-08-09 DIAGNOSIS — F0390 Unspecified dementia without behavioral disturbance: Secondary | ICD-10-CM | POA: Diagnosis not present

## 2015-08-10 DIAGNOSIS — F0390 Unspecified dementia without behavioral disturbance: Secondary | ICD-10-CM | POA: Diagnosis not present

## 2015-08-11 DIAGNOSIS — F0390 Unspecified dementia without behavioral disturbance: Secondary | ICD-10-CM | POA: Diagnosis not present

## 2015-08-12 DIAGNOSIS — F0390 Unspecified dementia without behavioral disturbance: Secondary | ICD-10-CM | POA: Diagnosis not present

## 2015-08-13 DIAGNOSIS — F0390 Unspecified dementia without behavioral disturbance: Secondary | ICD-10-CM | POA: Diagnosis not present

## 2015-08-14 DIAGNOSIS — F0390 Unspecified dementia without behavioral disturbance: Secondary | ICD-10-CM | POA: Diagnosis not present

## 2015-08-15 DIAGNOSIS — F0390 Unspecified dementia without behavioral disturbance: Secondary | ICD-10-CM | POA: Diagnosis not present

## 2015-08-16 DIAGNOSIS — F0390 Unspecified dementia without behavioral disturbance: Secondary | ICD-10-CM | POA: Diagnosis not present

## 2015-08-17 DIAGNOSIS — F0390 Unspecified dementia without behavioral disturbance: Secondary | ICD-10-CM | POA: Diagnosis not present

## 2015-08-18 DIAGNOSIS — F0390 Unspecified dementia without behavioral disturbance: Secondary | ICD-10-CM | POA: Diagnosis not present

## 2015-08-19 DIAGNOSIS — F0390 Unspecified dementia without behavioral disturbance: Secondary | ICD-10-CM | POA: Diagnosis not present

## 2015-08-20 DIAGNOSIS — F0390 Unspecified dementia without behavioral disturbance: Secondary | ICD-10-CM | POA: Diagnosis not present

## 2015-08-21 DIAGNOSIS — F0390 Unspecified dementia without behavioral disturbance: Secondary | ICD-10-CM | POA: Diagnosis not present

## 2015-08-22 DIAGNOSIS — F0390 Unspecified dementia without behavioral disturbance: Secondary | ICD-10-CM | POA: Diagnosis not present

## 2015-08-23 DIAGNOSIS — F0390 Unspecified dementia without behavioral disturbance: Secondary | ICD-10-CM | POA: Diagnosis not present

## 2015-08-24 DIAGNOSIS — F0390 Unspecified dementia without behavioral disturbance: Secondary | ICD-10-CM | POA: Diagnosis not present

## 2015-08-25 DIAGNOSIS — F0390 Unspecified dementia without behavioral disturbance: Secondary | ICD-10-CM | POA: Diagnosis not present

## 2015-08-26 DIAGNOSIS — F0390 Unspecified dementia without behavioral disturbance: Secondary | ICD-10-CM | POA: Diagnosis not present

## 2015-08-27 DIAGNOSIS — F0390 Unspecified dementia without behavioral disturbance: Secondary | ICD-10-CM | POA: Diagnosis not present

## 2015-08-28 DIAGNOSIS — F0390 Unspecified dementia without behavioral disturbance: Secondary | ICD-10-CM | POA: Diagnosis not present

## 2015-08-29 DIAGNOSIS — F0390 Unspecified dementia without behavioral disturbance: Secondary | ICD-10-CM | POA: Diagnosis not present

## 2015-08-30 DIAGNOSIS — F0390 Unspecified dementia without behavioral disturbance: Secondary | ICD-10-CM | POA: Diagnosis not present

## 2015-08-31 DIAGNOSIS — F0390 Unspecified dementia without behavioral disturbance: Secondary | ICD-10-CM | POA: Diagnosis not present

## 2015-09-01 DIAGNOSIS — F0390 Unspecified dementia without behavioral disturbance: Secondary | ICD-10-CM | POA: Diagnosis not present

## 2015-09-02 DIAGNOSIS — F0390 Unspecified dementia without behavioral disturbance: Secondary | ICD-10-CM | POA: Diagnosis not present

## 2015-09-03 DIAGNOSIS — F0390 Unspecified dementia without behavioral disturbance: Secondary | ICD-10-CM | POA: Diagnosis not present

## 2015-09-04 DIAGNOSIS — F0390 Unspecified dementia without behavioral disturbance: Secondary | ICD-10-CM | POA: Diagnosis not present

## 2015-09-05 DIAGNOSIS — F0390 Unspecified dementia without behavioral disturbance: Secondary | ICD-10-CM | POA: Diagnosis not present

## 2015-09-06 DIAGNOSIS — F0390 Unspecified dementia without behavioral disturbance: Secondary | ICD-10-CM | POA: Diagnosis not present

## 2015-09-07 DIAGNOSIS — F0390 Unspecified dementia without behavioral disturbance: Secondary | ICD-10-CM | POA: Diagnosis not present

## 2015-09-08 DIAGNOSIS — F0390 Unspecified dementia without behavioral disturbance: Secondary | ICD-10-CM | POA: Diagnosis not present

## 2015-09-09 DIAGNOSIS — F0390 Unspecified dementia without behavioral disturbance: Secondary | ICD-10-CM | POA: Diagnosis not present

## 2015-09-10 DIAGNOSIS — F0390 Unspecified dementia without behavioral disturbance: Secondary | ICD-10-CM | POA: Diagnosis not present

## 2015-09-11 DIAGNOSIS — F0390 Unspecified dementia without behavioral disturbance: Secondary | ICD-10-CM | POA: Diagnosis not present

## 2015-09-12 DIAGNOSIS — F0390 Unspecified dementia without behavioral disturbance: Secondary | ICD-10-CM | POA: Diagnosis not present

## 2015-09-13 DIAGNOSIS — F0390 Unspecified dementia without behavioral disturbance: Secondary | ICD-10-CM | POA: Diagnosis not present

## 2015-09-14 DIAGNOSIS — F0390 Unspecified dementia without behavioral disturbance: Secondary | ICD-10-CM | POA: Diagnosis not present

## 2015-09-15 DIAGNOSIS — F0390 Unspecified dementia without behavioral disturbance: Secondary | ICD-10-CM | POA: Diagnosis not present

## 2015-09-16 DIAGNOSIS — F0390 Unspecified dementia without behavioral disturbance: Secondary | ICD-10-CM | POA: Diagnosis not present

## 2015-09-17 DIAGNOSIS — F0390 Unspecified dementia without behavioral disturbance: Secondary | ICD-10-CM | POA: Diagnosis not present

## 2015-09-18 DIAGNOSIS — F0390 Unspecified dementia without behavioral disturbance: Secondary | ICD-10-CM | POA: Diagnosis not present

## 2015-09-19 DIAGNOSIS — F0390 Unspecified dementia without behavioral disturbance: Secondary | ICD-10-CM | POA: Diagnosis not present

## 2015-09-20 DIAGNOSIS — F0390 Unspecified dementia without behavioral disturbance: Secondary | ICD-10-CM | POA: Diagnosis not present

## 2015-09-21 DIAGNOSIS — F0390 Unspecified dementia without behavioral disturbance: Secondary | ICD-10-CM | POA: Diagnosis not present

## 2015-09-22 DIAGNOSIS — F0390 Unspecified dementia without behavioral disturbance: Secondary | ICD-10-CM | POA: Diagnosis not present

## 2015-09-23 DIAGNOSIS — F0390 Unspecified dementia without behavioral disturbance: Secondary | ICD-10-CM | POA: Diagnosis not present

## 2015-09-24 DIAGNOSIS — F0390 Unspecified dementia without behavioral disturbance: Secondary | ICD-10-CM | POA: Diagnosis not present

## 2015-09-25 DIAGNOSIS — F0390 Unspecified dementia without behavioral disturbance: Secondary | ICD-10-CM | POA: Diagnosis not present

## 2015-09-26 DIAGNOSIS — F0390 Unspecified dementia without behavioral disturbance: Secondary | ICD-10-CM | POA: Diagnosis not present

## 2015-09-27 DIAGNOSIS — F0390 Unspecified dementia without behavioral disturbance: Secondary | ICD-10-CM | POA: Diagnosis not present

## 2015-09-28 DIAGNOSIS — F0390 Unspecified dementia without behavioral disturbance: Secondary | ICD-10-CM | POA: Diagnosis not present

## 2015-09-29 DIAGNOSIS — F0390 Unspecified dementia without behavioral disturbance: Secondary | ICD-10-CM | POA: Diagnosis not present

## 2015-09-30 DIAGNOSIS — F0390 Unspecified dementia without behavioral disturbance: Secondary | ICD-10-CM | POA: Diagnosis not present

## 2015-10-01 DIAGNOSIS — F0390 Unspecified dementia without behavioral disturbance: Secondary | ICD-10-CM | POA: Diagnosis not present

## 2015-10-02 DIAGNOSIS — F0390 Unspecified dementia without behavioral disturbance: Secondary | ICD-10-CM | POA: Diagnosis not present

## 2015-10-03 DIAGNOSIS — F0390 Unspecified dementia without behavioral disturbance: Secondary | ICD-10-CM | POA: Diagnosis not present

## 2015-10-04 DIAGNOSIS — F0390 Unspecified dementia without behavioral disturbance: Secondary | ICD-10-CM | POA: Diagnosis not present

## 2015-10-05 DIAGNOSIS — F0390 Unspecified dementia without behavioral disturbance: Secondary | ICD-10-CM | POA: Diagnosis not present

## 2015-10-06 DIAGNOSIS — F0390 Unspecified dementia without behavioral disturbance: Secondary | ICD-10-CM | POA: Diagnosis not present

## 2015-10-07 DIAGNOSIS — F0390 Unspecified dementia without behavioral disturbance: Secondary | ICD-10-CM | POA: Diagnosis not present

## 2015-10-08 DIAGNOSIS — F0390 Unspecified dementia without behavioral disturbance: Secondary | ICD-10-CM | POA: Diagnosis not present

## 2015-10-09 DIAGNOSIS — F0390 Unspecified dementia without behavioral disturbance: Secondary | ICD-10-CM | POA: Diagnosis not present

## 2015-10-10 DIAGNOSIS — F0390 Unspecified dementia without behavioral disturbance: Secondary | ICD-10-CM | POA: Diagnosis not present

## 2015-10-11 DIAGNOSIS — F0390 Unspecified dementia without behavioral disturbance: Secondary | ICD-10-CM | POA: Diagnosis not present

## 2015-10-12 DIAGNOSIS — F0390 Unspecified dementia without behavioral disturbance: Secondary | ICD-10-CM | POA: Diagnosis not present

## 2015-10-13 DIAGNOSIS — F0390 Unspecified dementia without behavioral disturbance: Secondary | ICD-10-CM | POA: Diagnosis not present

## 2015-10-14 DIAGNOSIS — F0390 Unspecified dementia without behavioral disturbance: Secondary | ICD-10-CM | POA: Diagnosis not present

## 2015-10-15 DIAGNOSIS — F0390 Unspecified dementia without behavioral disturbance: Secondary | ICD-10-CM | POA: Diagnosis not present

## 2015-10-16 DIAGNOSIS — F0390 Unspecified dementia without behavioral disturbance: Secondary | ICD-10-CM | POA: Diagnosis not present

## 2015-10-17 DIAGNOSIS — F0390 Unspecified dementia without behavioral disturbance: Secondary | ICD-10-CM | POA: Diagnosis not present

## 2015-10-18 DIAGNOSIS — F0390 Unspecified dementia without behavioral disturbance: Secondary | ICD-10-CM | POA: Diagnosis not present

## 2015-10-19 DIAGNOSIS — F0390 Unspecified dementia without behavioral disturbance: Secondary | ICD-10-CM | POA: Diagnosis not present

## 2015-10-20 DIAGNOSIS — F0390 Unspecified dementia without behavioral disturbance: Secondary | ICD-10-CM | POA: Diagnosis not present

## 2015-10-21 DIAGNOSIS — F0390 Unspecified dementia without behavioral disturbance: Secondary | ICD-10-CM | POA: Diagnosis not present

## 2015-10-22 DIAGNOSIS — F0390 Unspecified dementia without behavioral disturbance: Secondary | ICD-10-CM | POA: Diagnosis not present

## 2015-10-23 DIAGNOSIS — F0390 Unspecified dementia without behavioral disturbance: Secondary | ICD-10-CM | POA: Diagnosis not present

## 2015-10-24 DIAGNOSIS — F0390 Unspecified dementia without behavioral disturbance: Secondary | ICD-10-CM | POA: Diagnosis not present

## 2015-10-25 DIAGNOSIS — F0390 Unspecified dementia without behavioral disturbance: Secondary | ICD-10-CM | POA: Diagnosis not present

## 2015-10-26 DIAGNOSIS — F0390 Unspecified dementia without behavioral disturbance: Secondary | ICD-10-CM | POA: Diagnosis not present

## 2015-10-27 DIAGNOSIS — F0390 Unspecified dementia without behavioral disturbance: Secondary | ICD-10-CM | POA: Diagnosis not present

## 2015-10-28 DIAGNOSIS — F0390 Unspecified dementia without behavioral disturbance: Secondary | ICD-10-CM | POA: Diagnosis not present

## 2015-10-29 DIAGNOSIS — F0390 Unspecified dementia without behavioral disturbance: Secondary | ICD-10-CM | POA: Diagnosis not present

## 2015-10-30 DIAGNOSIS — F0390 Unspecified dementia without behavioral disturbance: Secondary | ICD-10-CM | POA: Diagnosis not present

## 2015-10-31 DIAGNOSIS — F0390 Unspecified dementia without behavioral disturbance: Secondary | ICD-10-CM | POA: Diagnosis not present

## 2015-11-01 DIAGNOSIS — F0390 Unspecified dementia without behavioral disturbance: Secondary | ICD-10-CM | POA: Diagnosis not present

## 2015-11-02 DIAGNOSIS — F0390 Unspecified dementia without behavioral disturbance: Secondary | ICD-10-CM | POA: Diagnosis not present

## 2015-11-03 DIAGNOSIS — F0390 Unspecified dementia without behavioral disturbance: Secondary | ICD-10-CM | POA: Diagnosis not present

## 2015-11-04 DIAGNOSIS — F0390 Unspecified dementia without behavioral disturbance: Secondary | ICD-10-CM | POA: Diagnosis not present

## 2015-11-05 DIAGNOSIS — F0390 Unspecified dementia without behavioral disturbance: Secondary | ICD-10-CM | POA: Diagnosis not present

## 2015-11-06 DIAGNOSIS — F0390 Unspecified dementia without behavioral disturbance: Secondary | ICD-10-CM | POA: Diagnosis not present

## 2015-11-07 DIAGNOSIS — F0390 Unspecified dementia without behavioral disturbance: Secondary | ICD-10-CM | POA: Diagnosis not present

## 2015-11-08 DIAGNOSIS — F0390 Unspecified dementia without behavioral disturbance: Secondary | ICD-10-CM | POA: Diagnosis not present

## 2015-11-09 DIAGNOSIS — F0390 Unspecified dementia without behavioral disturbance: Secondary | ICD-10-CM | POA: Diagnosis not present

## 2015-11-10 DIAGNOSIS — F0390 Unspecified dementia without behavioral disturbance: Secondary | ICD-10-CM | POA: Diagnosis not present

## 2015-11-11 DIAGNOSIS — F0390 Unspecified dementia without behavioral disturbance: Secondary | ICD-10-CM | POA: Diagnosis not present

## 2015-11-12 DIAGNOSIS — F0390 Unspecified dementia without behavioral disturbance: Secondary | ICD-10-CM | POA: Diagnosis not present

## 2015-11-13 DIAGNOSIS — F0390 Unspecified dementia without behavioral disturbance: Secondary | ICD-10-CM | POA: Diagnosis not present

## 2015-11-14 DIAGNOSIS — F0390 Unspecified dementia without behavioral disturbance: Secondary | ICD-10-CM | POA: Diagnosis not present

## 2015-11-15 DIAGNOSIS — F0390 Unspecified dementia without behavioral disturbance: Secondary | ICD-10-CM | POA: Diagnosis not present

## 2015-11-16 DIAGNOSIS — F0390 Unspecified dementia without behavioral disturbance: Secondary | ICD-10-CM | POA: Diagnosis not present

## 2015-11-17 DIAGNOSIS — F0390 Unspecified dementia without behavioral disturbance: Secondary | ICD-10-CM | POA: Diagnosis not present

## 2015-11-18 DIAGNOSIS — F0390 Unspecified dementia without behavioral disturbance: Secondary | ICD-10-CM | POA: Diagnosis not present

## 2015-11-19 DIAGNOSIS — F0390 Unspecified dementia without behavioral disturbance: Secondary | ICD-10-CM | POA: Diagnosis not present

## 2015-11-20 DIAGNOSIS — F0390 Unspecified dementia without behavioral disturbance: Secondary | ICD-10-CM | POA: Diagnosis not present

## 2015-11-21 DIAGNOSIS — F0390 Unspecified dementia without behavioral disturbance: Secondary | ICD-10-CM | POA: Diagnosis not present

## 2015-11-22 DIAGNOSIS — F0390 Unspecified dementia without behavioral disturbance: Secondary | ICD-10-CM | POA: Diagnosis not present

## 2015-11-23 DIAGNOSIS — F0390 Unspecified dementia without behavioral disturbance: Secondary | ICD-10-CM | POA: Diagnosis not present

## 2015-11-24 DIAGNOSIS — F0390 Unspecified dementia without behavioral disturbance: Secondary | ICD-10-CM | POA: Diagnosis not present

## 2015-11-25 DIAGNOSIS — F0390 Unspecified dementia without behavioral disturbance: Secondary | ICD-10-CM | POA: Diagnosis not present

## 2015-11-26 DIAGNOSIS — F0390 Unspecified dementia without behavioral disturbance: Secondary | ICD-10-CM | POA: Diagnosis not present

## 2015-11-27 DIAGNOSIS — F0390 Unspecified dementia without behavioral disturbance: Secondary | ICD-10-CM | POA: Diagnosis not present

## 2015-11-28 DIAGNOSIS — F0390 Unspecified dementia without behavioral disturbance: Secondary | ICD-10-CM | POA: Diagnosis not present

## 2015-11-29 DIAGNOSIS — F0390 Unspecified dementia without behavioral disturbance: Secondary | ICD-10-CM | POA: Diagnosis not present

## 2015-11-30 DIAGNOSIS — F0390 Unspecified dementia without behavioral disturbance: Secondary | ICD-10-CM | POA: Diagnosis not present

## 2015-12-01 DIAGNOSIS — F0390 Unspecified dementia without behavioral disturbance: Secondary | ICD-10-CM | POA: Diagnosis not present

## 2015-12-02 DIAGNOSIS — F0390 Unspecified dementia without behavioral disturbance: Secondary | ICD-10-CM | POA: Diagnosis not present

## 2015-12-03 DIAGNOSIS — F0390 Unspecified dementia without behavioral disturbance: Secondary | ICD-10-CM | POA: Diagnosis not present

## 2015-12-04 DIAGNOSIS — F0390 Unspecified dementia without behavioral disturbance: Secondary | ICD-10-CM | POA: Diagnosis not present

## 2015-12-05 DIAGNOSIS — F0390 Unspecified dementia without behavioral disturbance: Secondary | ICD-10-CM | POA: Diagnosis not present

## 2015-12-06 DIAGNOSIS — F0390 Unspecified dementia without behavioral disturbance: Secondary | ICD-10-CM | POA: Diagnosis not present

## 2015-12-07 ENCOUNTER — Encounter (HOSPITAL_COMMUNITY): Payer: Self-pay | Admitting: Emergency Medicine

## 2015-12-07 ENCOUNTER — Ambulatory Visit (HOSPITAL_COMMUNITY)
Admission: EM | Admit: 2015-12-07 | Discharge: 2015-12-07 | Disposition: A | Discharge status: Home / Self Care | Payer: Medicare HMO | Attending: Family Medicine | Admitting: Family Medicine

## 2015-12-07 DIAGNOSIS — T148XXA Other injury of unspecified body region, initial encounter: Secondary | ICD-10-CM | POA: Diagnosis not present

## 2015-12-07 DIAGNOSIS — F0390 Unspecified dementia without behavioral disturbance: Secondary | ICD-10-CM | POA: Diagnosis not present

## 2015-12-07 DIAGNOSIS — W57XXXA Bitten or stung by nonvenomous insect and other nonvenomous arthropods, initial encounter: Secondary | ICD-10-CM

## 2015-12-07 MED ORDER — METHYLPREDNISOLONE 4 MG PO TBPK: ORAL_TABLET | ORAL | 0 refills | Status: DC

## 2015-12-07 MED ORDER — HYDROXYZINE HCL 25 MG PO TABS: 25.0000 mg | ORAL_TABLET | Freq: Four times a day (QID) | ORAL | 0 refills | Status: DC

## 2015-12-07 NOTE — Discharge Instructions (Signed)
Bedbugs Introduction  Bedbugs are tiny bugs that live in and around beds. They stay hidden during the day, and they come out at night and bite. Bedbugs need blood to live and grow. Where are bedbugs found? Bedbugs can be found anywhere, whether a place is clean or dirty. They are most often found in places where many people come and go, such as hotels, shelters, dorms, and health care settings. It is also common for them to be found in homes where there are many birds or bats nearby. What are bedbug bites like? A bedbug bite leaves a small red bump with a darker red dot in the middle. The bump may appear soon after a person is bitten or a day or more later. Bedbug bites usually do not hurt, but they may itch. Most people do not need treatment for bedbug bites. The bumps usually go away on their own in a few days. How do I check for bedbugs? Bedbugs are reddish-brown, oval, and flat. They range in size from 1 mm to 7 mm and they cannot fly. Look for bedbugs in these places: On mattresses, bed frames, headboards, and box springs. On drapes and curtains in bedrooms. Under carpeting in bedrooms. Behind electrical outlets. Behind any wallpaper that is peeling. Inside luggage. Also look for black or red spots or stains on or near the bed. Stains can come from bedbugs that have been crushed or from bedbug waste. What should I do if I find bedbugs? When Traveling  If you find bedbugs while traveling, check all of your possessions carefully before you bring them into your home. Consider throwing away anything that has bedbugs on it. At Home  If you find bedbugs at home, your bedroom may need to be treated by a pest control expert. You may also need to throw away mattresses or luggage. To help keep bedbugs from coming back, consider taking these actions: Put a plastic cover over your mattress. Wash your clothes and bedding in water that is hotter than 120F (48.9C) and dry them on a hot setting.  Bedbugs are killed by high temperatures. Vacuum often around the bed and in all of the cracks and crevices where the bugs might hide. Carefully check all used furniture, bedding, or clothes that you bring into your home. Eliminate bird nests and bat roosts that are near your home. In Your Bed  If you find bedbugs in your bed, consider wearing pajamas that have long sleeves and pant legs. Bedbugs usually bite areas of the skin that are not covered. This information is not intended to replace advice given to you by your health care provider. Make sure you discuss any questions you have with your health care provider. Document Released: 01/21/2010 Document Revised: 05/27/2015 Document Reviewed: 12/15/2013  2017 Elsevier

## 2015-12-07 NOTE — ED Provider Notes (Signed)
CSN: [id number]     Arrival date & time 12/07/15  1103 History   First MD Initiated Contact with Patient 12/07/15 1257     Chief Complaint  Patient presents with  . Insect Bite   (Consider location/radiation/quality/duration/timing/severity/associated sxs/prior Treatment) Patient c/o rash and bugs in her bed that are biting her and leaving a rash.   The history is provided by the patient.  Rash  Location:  Leg and torso Torso rash location:  Lower back, abd LUQ and abd LLQ Leg rash location:  L leg and R leg Quality: itchiness and redness   Severity:  Moderate Onset quality:  Sudden Duration:  1 week Timing:  Constant Progression:  Worsening Chronicity:  New Context: insect bite/sting   Relieved by:  None tried Worsened by:  Nothing Ineffective treatments:  None tried   Past Medical History:  Diagnosis Date  . Blood transfusion without reported diagnosis   . Depression   . Neuromuscular disorder [facility](HCC)    Past Surgical History:  Procedure Laterality Date  . BRAIN SURGERY    . TUBAL LIGATION     Family History  Problem Relation Age of Onset  . Stroke Mother   . Cancer Mother   . Cancer Brother   . Mental illness Maternal Grandmother    Social History  Substance Use Topics  . Smoking status: Current Some Day Smoker    Packs/day: 0.50    Years: 53.00  . Smokeless tobacco: Not on file  . Alcohol use 1.2 oz/week    2 Cans of beer per week   OB History    No data available     Review of Systems  Constitutional: Negative.   HENT: Negative.   Eyes: Negative.   Respiratory: Negative.   Cardiovascular: Negative.   Gastrointestinal: Negative.   Endocrine: Negative.   Genitourinary: Negative.   Musculoskeletal: Negative.   Skin: Positive for rash.  Allergic/Immunologic: Negative.   Neurological: Negative.   Hematological: Negative.   Psychiatric/Behavioral: Negative.     Allergies  Patient has no known allergies.  Home Medications   Prior to  Admission medications   Medication Sig Start Date End Date Taking? Authorizing Provider  acetaminophen (TYLENOL) 500 MG tablet Take 500 mg by mouth every 8 (eight) hours as needed for moderate pain.    Historical Provider, MD  Calcium-Vit D-Arg-Inos-Silicon (BONE DENSITY) 300-200 MG-UNIT TABS Take 1 tablet by mouth 2 (two) times daily. Patient not taking: Reported on 04/15/2014 12/02/12   Carmelina DaneJeffery S Anderson, MD  hydrOXYzine (ATARAX/VISTARIL) 25 MG tablet Take 1 tablet (25 mg total) by mouth every 6 (six) hours. 12/07/15   Deatra CanterWilliam J Oxford, FNP  lansoprazole (PREVACID) 30 MG capsule Take 1 capsule (30 mg total) by mouth daily at 12 noon. Patient not taking: Reported on 04/15/2014 03/24/13   Collene GobbleSteven A Daub, MD  lansoprazole (PREVACID) 30 MG capsule Take 1 capsule (30 mg total) by mouth daily at 12 noon. 06/22/14   Carmelina DaneJeffery S Anderson, MD  meloxicam (MOBIC) 15 MG tablet Take 15 mg by mouth as needed for pain.    Historical Provider, MD  meloxicam (MOBIC) 15 MG tablet Take 1 tablet (15 mg total) by mouth daily. 06/22/14   Carmelina DaneJeffery S Anderson, MD  methylPREDNISolone (MEDROL DOSEPAK) 4 MG TBPK tablet Take 6-5-4-3-2-1 po qd 12/07/15   Deatra CanterWilliam J Oxford, FNP  oxyCODONE-acetaminophen (PERCOCET) 5-325 MG per tablet Take 1-2 tablets by mouth every 6 (six) hours as needed for severe pain. Patient not taking: Reported on 04/15/2014  05/12/13   Jennifer Piepenbrink, PA-C  sucralfate (CARAFATE) 1 G tablet Take 1 tablet (1 g total) by mouth 4 (four) times daily -  with meals and at bedtime. Patient not taking: Reported on 04/15/2014 03/24/13   Collene GobbleSteven A Daub, MD  tiZANidine (ZANAFLEX) 4 MG tablet Take 4 mg by mouth every 6 (six) hours as needed for muscle spasms.    Historical Provider, MD  zoster vaccine live, PF, (ZOSTAVAX) [id number] UNT/0.65ML injection Inject 19,400 Units into the skin once. Patient not taking: Reported on 04/15/2014 03/24/13   Collene GobbleSteven A Daub, MD   Meds Ordered and Administered this Visit  Medications - No data to  display  BP 126/76 (BP Location: Left Arm)   Pulse (!) 52   Temp 97.9 F (36.6 C) (Oral)   Resp 22   SpO2 100%  No data found.   Physical Exam  Constitutional: She appears well-developed and well-nourished.  HENT:  Head: Normocephalic and atraumatic.  Eyes: Conjunctivae and EOM are normal. Pupils are equal, round, and reactive to light.  Neck: Normal range of motion. Neck supple.  Cardiovascular: Normal rate, regular rhythm and normal heart sounds.   Pulmonary/Chest: Effort normal and breath sounds normal.  Skin: Rash noted.  Macular papular rash on legs and abdomen  Nursing note and vitals reviewed.   Urgent Care Course   Clinical Course     Procedures (including critical care time)  Labs Review Labs Reviewed - No data to display  Imaging Review No results found.   Visual Acuity Review  Right Eye Distance:   Left Eye Distance:   Bilateral Distance:    Right Eye Near:   Left Eye Near:    Bilateral Near:         MDM   1. Insect bite, initial encounter    Discussed with patient the insect she brought in looks like bed bugs and would treat with medrol dose pack and hydroxyzine. Explained will need to get a fogger to kill bed bugs and will need to clean linen.     Deatra CanterWilliam J Oxford, FNP 12/07/15 [phone]

## 2015-12-07 NOTE — ED Triage Notes (Signed)
Patient concerned for bug bites and has a cup with her and has a bug sample in it.  Bites itch terribly per patient

## 2015-12-08 DIAGNOSIS — F0390 Unspecified dementia without behavioral disturbance: Secondary | ICD-10-CM | POA: Diagnosis not present

## 2015-12-09 DIAGNOSIS — F0390 Unspecified dementia without behavioral disturbance: Secondary | ICD-10-CM | POA: Diagnosis not present

## 2015-12-10 DIAGNOSIS — F0390 Unspecified dementia without behavioral disturbance: Secondary | ICD-10-CM | POA: Diagnosis not present

## 2015-12-11 DIAGNOSIS — F0390 Unspecified dementia without behavioral disturbance: Secondary | ICD-10-CM | POA: Diagnosis not present

## 2015-12-12 DIAGNOSIS — F0390 Unspecified dementia without behavioral disturbance: Secondary | ICD-10-CM | POA: Diagnosis not present

## 2015-12-13 DIAGNOSIS — F0390 Unspecified dementia without behavioral disturbance: Secondary | ICD-10-CM | POA: Diagnosis not present

## 2015-12-14 DIAGNOSIS — F0390 Unspecified dementia without behavioral disturbance: Secondary | ICD-10-CM | POA: Diagnosis not present

## 2015-12-15 DIAGNOSIS — F0390 Unspecified dementia without behavioral disturbance: Secondary | ICD-10-CM | POA: Diagnosis not present

## 2015-12-16 DIAGNOSIS — F0390 Unspecified dementia without behavioral disturbance: Secondary | ICD-10-CM | POA: Diagnosis not present

## 2015-12-17 DIAGNOSIS — F0390 Unspecified dementia without behavioral disturbance: Secondary | ICD-10-CM | POA: Diagnosis not present

## 2015-12-18 DIAGNOSIS — F0390 Unspecified dementia without behavioral disturbance: Secondary | ICD-10-CM | POA: Diagnosis not present

## 2015-12-19 DIAGNOSIS — F0390 Unspecified dementia without behavioral disturbance: Secondary | ICD-10-CM | POA: Diagnosis not present

## 2015-12-20 DIAGNOSIS — F0390 Unspecified dementia without behavioral disturbance: Secondary | ICD-10-CM | POA: Diagnosis not present

## 2015-12-21 DIAGNOSIS — F0390 Unspecified dementia without behavioral disturbance: Secondary | ICD-10-CM | POA: Diagnosis not present

## 2015-12-22 DIAGNOSIS — F0390 Unspecified dementia without behavioral disturbance: Secondary | ICD-10-CM | POA: Diagnosis not present

## 2015-12-23 DIAGNOSIS — F0390 Unspecified dementia without behavioral disturbance: Secondary | ICD-10-CM | POA: Diagnosis not present

## 2015-12-24 DIAGNOSIS — F0390 Unspecified dementia without behavioral disturbance: Secondary | ICD-10-CM | POA: Diagnosis not present

## 2015-12-25 DIAGNOSIS — F0390 Unspecified dementia without behavioral disturbance: Secondary | ICD-10-CM | POA: Diagnosis not present

## 2015-12-26 DIAGNOSIS — F0390 Unspecified dementia without behavioral disturbance: Secondary | ICD-10-CM | POA: Diagnosis not present

## 2015-12-27 DIAGNOSIS — F0390 Unspecified dementia without behavioral disturbance: Secondary | ICD-10-CM | POA: Diagnosis not present

## 2015-12-28 DIAGNOSIS — F0390 Unspecified dementia without behavioral disturbance: Secondary | ICD-10-CM | POA: Diagnosis not present

## 2015-12-29 DIAGNOSIS — F0390 Unspecified dementia without behavioral disturbance: Secondary | ICD-10-CM | POA: Diagnosis not present

## 2015-12-30 DIAGNOSIS — F0390 Unspecified dementia without behavioral disturbance: Secondary | ICD-10-CM | POA: Diagnosis not present

## 2015-12-31 DIAGNOSIS — F0390 Unspecified dementia without behavioral disturbance: Secondary | ICD-10-CM | POA: Diagnosis not present

## 2016-01-01 DIAGNOSIS — F0390 Unspecified dementia without behavioral disturbance: Secondary | ICD-10-CM | POA: Diagnosis not present

## 2016-01-02 DIAGNOSIS — F0390 Unspecified dementia without behavioral disturbance: Secondary | ICD-10-CM | POA: Diagnosis not present

## 2016-01-03 DIAGNOSIS — F0390 Unspecified dementia without behavioral disturbance: Secondary | ICD-10-CM | POA: Diagnosis not present

## 2016-01-04 DIAGNOSIS — F0390 Unspecified dementia without behavioral disturbance: Secondary | ICD-10-CM | POA: Diagnosis not present

## 2016-01-05 DIAGNOSIS — F0390 Unspecified dementia without behavioral disturbance: Secondary | ICD-10-CM | POA: Diagnosis not present

## 2016-01-06 DIAGNOSIS — F0390 Unspecified dementia without behavioral disturbance: Secondary | ICD-10-CM | POA: Diagnosis not present

## 2016-01-07 DIAGNOSIS — F0390 Unspecified dementia without behavioral disturbance: Secondary | ICD-10-CM | POA: Diagnosis not present

## 2016-01-08 DIAGNOSIS — F0390 Unspecified dementia without behavioral disturbance: Secondary | ICD-10-CM | POA: Diagnosis not present

## 2016-01-09 DIAGNOSIS — F0390 Unspecified dementia without behavioral disturbance: Secondary | ICD-10-CM | POA: Diagnosis not present

## 2016-01-10 DIAGNOSIS — F0390 Unspecified dementia without behavioral disturbance: Secondary | ICD-10-CM | POA: Diagnosis not present

## 2016-01-11 DIAGNOSIS — F0390 Unspecified dementia without behavioral disturbance: Secondary | ICD-10-CM | POA: Diagnosis not present

## 2016-01-12 DIAGNOSIS — F0390 Unspecified dementia without behavioral disturbance: Secondary | ICD-10-CM | POA: Diagnosis not present

## 2016-01-13 DIAGNOSIS — F0390 Unspecified dementia without behavioral disturbance: Secondary | ICD-10-CM | POA: Diagnosis not present

## 2016-01-14 DIAGNOSIS — F0390 Unspecified dementia without behavioral disturbance: Secondary | ICD-10-CM | POA: Diagnosis not present

## 2016-01-15 DIAGNOSIS — F0390 Unspecified dementia without behavioral disturbance: Secondary | ICD-10-CM | POA: Diagnosis not present

## 2016-01-16 DIAGNOSIS — F0390 Unspecified dementia without behavioral disturbance: Secondary | ICD-10-CM | POA: Diagnosis not present

## 2016-01-17 DIAGNOSIS — F0390 Unspecified dementia without behavioral disturbance: Secondary | ICD-10-CM | POA: Diagnosis not present

## 2016-01-18 DIAGNOSIS — F0390 Unspecified dementia without behavioral disturbance: Secondary | ICD-10-CM | POA: Diagnosis not present

## 2016-01-19 DIAGNOSIS — F0390 Unspecified dementia without behavioral disturbance: Secondary | ICD-10-CM | POA: Diagnosis not present

## 2016-01-20 DIAGNOSIS — F0390 Unspecified dementia without behavioral disturbance: Secondary | ICD-10-CM | POA: Diagnosis not present

## 2016-01-21 DIAGNOSIS — F0390 Unspecified dementia without behavioral disturbance: Secondary | ICD-10-CM | POA: Diagnosis not present

## 2016-01-22 DIAGNOSIS — F0390 Unspecified dementia without behavioral disturbance: Secondary | ICD-10-CM | POA: Diagnosis not present

## 2016-01-23 DIAGNOSIS — F0390 Unspecified dementia without behavioral disturbance: Secondary | ICD-10-CM | POA: Diagnosis not present

## 2016-01-24 DIAGNOSIS — F0390 Unspecified dementia without behavioral disturbance: Secondary | ICD-10-CM | POA: Diagnosis not present

## 2016-01-25 DIAGNOSIS — F0390 Unspecified dementia without behavioral disturbance: Secondary | ICD-10-CM | POA: Diagnosis not present

## 2016-01-26 DIAGNOSIS — F0390 Unspecified dementia without behavioral disturbance: Secondary | ICD-10-CM | POA: Diagnosis not present

## 2016-01-27 DIAGNOSIS — F0390 Unspecified dementia without behavioral disturbance: Secondary | ICD-10-CM | POA: Diagnosis not present

## 2016-01-28 DIAGNOSIS — F0390 Unspecified dementia without behavioral disturbance: Secondary | ICD-10-CM | POA: Diagnosis not present

## 2016-01-29 DIAGNOSIS — F0390 Unspecified dementia without behavioral disturbance: Secondary | ICD-10-CM | POA: Diagnosis not present

## 2016-01-30 DIAGNOSIS — F0390 Unspecified dementia without behavioral disturbance: Secondary | ICD-10-CM | POA: Diagnosis not present

## 2016-01-31 DIAGNOSIS — F0390 Unspecified dementia without behavioral disturbance: Secondary | ICD-10-CM | POA: Diagnosis not present

## 2016-02-01 DIAGNOSIS — F0390 Unspecified dementia without behavioral disturbance: Secondary | ICD-10-CM | POA: Diagnosis not present

## 2016-02-02 DIAGNOSIS — F0390 Unspecified dementia without behavioral disturbance: Secondary | ICD-10-CM | POA: Diagnosis not present

## 2016-02-03 DIAGNOSIS — F0390 Unspecified dementia without behavioral disturbance: Secondary | ICD-10-CM | POA: Diagnosis not present

## 2016-02-04 DIAGNOSIS — F0390 Unspecified dementia without behavioral disturbance: Secondary | ICD-10-CM | POA: Diagnosis not present

## 2016-02-05 DIAGNOSIS — F0390 Unspecified dementia without behavioral disturbance: Secondary | ICD-10-CM | POA: Diagnosis not present

## 2016-02-06 DIAGNOSIS — F0390 Unspecified dementia without behavioral disturbance: Secondary | ICD-10-CM | POA: Diagnosis not present

## 2016-02-07 DIAGNOSIS — F0390 Unspecified dementia without behavioral disturbance: Secondary | ICD-10-CM | POA: Diagnosis not present

## 2016-02-08 DIAGNOSIS — F0390 Unspecified dementia without behavioral disturbance: Secondary | ICD-10-CM | POA: Diagnosis not present

## 2016-02-09 DIAGNOSIS — F0390 Unspecified dementia without behavioral disturbance: Secondary | ICD-10-CM | POA: Diagnosis not present

## 2016-02-10 DIAGNOSIS — F0390 Unspecified dementia without behavioral disturbance: Secondary | ICD-10-CM | POA: Diagnosis not present

## 2016-02-11 DIAGNOSIS — F0390 Unspecified dementia without behavioral disturbance: Secondary | ICD-10-CM | POA: Diagnosis not present

## 2016-02-12 DIAGNOSIS — F0390 Unspecified dementia without behavioral disturbance: Secondary | ICD-10-CM | POA: Diagnosis not present

## 2016-02-13 DIAGNOSIS — F0390 Unspecified dementia without behavioral disturbance: Secondary | ICD-10-CM | POA: Diagnosis not present

## 2016-02-14 DIAGNOSIS — F0390 Unspecified dementia without behavioral disturbance: Secondary | ICD-10-CM | POA: Diagnosis not present

## 2016-02-15 DIAGNOSIS — F0390 Unspecified dementia without behavioral disturbance: Secondary | ICD-10-CM | POA: Diagnosis not present

## 2016-02-16 DIAGNOSIS — F0390 Unspecified dementia without behavioral disturbance: Secondary | ICD-10-CM | POA: Diagnosis not present

## 2016-02-17 DIAGNOSIS — F0390 Unspecified dementia without behavioral disturbance: Secondary | ICD-10-CM | POA: Diagnosis not present

## 2016-02-18 DIAGNOSIS — F0390 Unspecified dementia without behavioral disturbance: Secondary | ICD-10-CM | POA: Diagnosis not present

## 2016-02-19 DIAGNOSIS — F0390 Unspecified dementia without behavioral disturbance: Secondary | ICD-10-CM | POA: Diagnosis not present

## 2016-02-20 DIAGNOSIS — F0390 Unspecified dementia without behavioral disturbance: Secondary | ICD-10-CM | POA: Diagnosis not present

## 2016-02-21 DIAGNOSIS — F0390 Unspecified dementia without behavioral disturbance: Secondary | ICD-10-CM | POA: Diagnosis not present

## 2016-02-22 DIAGNOSIS — F0390 Unspecified dementia without behavioral disturbance: Secondary | ICD-10-CM | POA: Diagnosis not present

## 2016-02-23 DIAGNOSIS — F0390 Unspecified dementia without behavioral disturbance: Secondary | ICD-10-CM | POA: Diagnosis not present

## 2016-02-24 DIAGNOSIS — F0390 Unspecified dementia without behavioral disturbance: Secondary | ICD-10-CM | POA: Diagnosis not present

## 2016-02-25 DIAGNOSIS — F0390 Unspecified dementia without behavioral disturbance: Secondary | ICD-10-CM | POA: Diagnosis not present

## 2016-02-26 DIAGNOSIS — F0390 Unspecified dementia without behavioral disturbance: Secondary | ICD-10-CM | POA: Diagnosis not present

## 2016-02-27 DIAGNOSIS — F0390 Unspecified dementia without behavioral disturbance: Secondary | ICD-10-CM | POA: Diagnosis not present

## 2016-02-28 DIAGNOSIS — F0390 Unspecified dementia without behavioral disturbance: Secondary | ICD-10-CM | POA: Diagnosis not present

## 2016-02-29 DIAGNOSIS — F0390 Unspecified dementia without behavioral disturbance: Secondary | ICD-10-CM | POA: Diagnosis not present

## 2016-03-01 DIAGNOSIS — F0390 Unspecified dementia without behavioral disturbance: Secondary | ICD-10-CM | POA: Diagnosis not present

## 2016-03-02 DIAGNOSIS — F0390 Unspecified dementia without behavioral disturbance: Secondary | ICD-10-CM | POA: Diagnosis not present

## 2016-03-03 DIAGNOSIS — F0390 Unspecified dementia without behavioral disturbance: Secondary | ICD-10-CM | POA: Diagnosis not present

## 2016-03-04 DIAGNOSIS — F0390 Unspecified dementia without behavioral disturbance: Secondary | ICD-10-CM | POA: Diagnosis not present

## 2016-03-05 DIAGNOSIS — F0390 Unspecified dementia without behavioral disturbance: Secondary | ICD-10-CM | POA: Diagnosis not present

## 2016-03-06 DIAGNOSIS — F0390 Unspecified dementia without behavioral disturbance: Secondary | ICD-10-CM | POA: Diagnosis not present

## 2016-03-07 DIAGNOSIS — F0390 Unspecified dementia without behavioral disturbance: Secondary | ICD-10-CM | POA: Diagnosis not present

## 2016-03-08 DIAGNOSIS — F0390 Unspecified dementia without behavioral disturbance: Secondary | ICD-10-CM | POA: Diagnosis not present

## 2016-03-09 DIAGNOSIS — F0390 Unspecified dementia without behavioral disturbance: Secondary | ICD-10-CM | POA: Diagnosis not present

## 2016-03-10 DIAGNOSIS — F0390 Unspecified dementia without behavioral disturbance: Secondary | ICD-10-CM | POA: Diagnosis not present

## 2016-03-11 DIAGNOSIS — F0390 Unspecified dementia without behavioral disturbance: Secondary | ICD-10-CM | POA: Diagnosis not present

## 2016-03-12 DIAGNOSIS — F0390 Unspecified dementia without behavioral disturbance: Secondary | ICD-10-CM | POA: Diagnosis not present

## 2016-03-13 DIAGNOSIS — F0390 Unspecified dementia without behavioral disturbance: Secondary | ICD-10-CM | POA: Diagnosis not present

## 2016-03-14 DIAGNOSIS — F0390 Unspecified dementia without behavioral disturbance: Secondary | ICD-10-CM | POA: Diagnosis not present

## 2016-03-15 DIAGNOSIS — F0390 Unspecified dementia without behavioral disturbance: Secondary | ICD-10-CM | POA: Diagnosis not present

## 2016-03-16 DIAGNOSIS — F0390 Unspecified dementia without behavioral disturbance: Secondary | ICD-10-CM | POA: Diagnosis not present

## 2016-03-17 DIAGNOSIS — F0390 Unspecified dementia without behavioral disturbance: Secondary | ICD-10-CM | POA: Diagnosis not present

## 2016-03-18 DIAGNOSIS — F0390 Unspecified dementia without behavioral disturbance: Secondary | ICD-10-CM | POA: Diagnosis not present

## 2016-03-19 DIAGNOSIS — F0390 Unspecified dementia without behavioral disturbance: Secondary | ICD-10-CM | POA: Diagnosis not present

## 2016-03-20 DIAGNOSIS — F0390 Unspecified dementia without behavioral disturbance: Secondary | ICD-10-CM | POA: Diagnosis not present

## 2016-03-21 DIAGNOSIS — F0390 Unspecified dementia without behavioral disturbance: Secondary | ICD-10-CM | POA: Diagnosis not present

## 2016-03-22 DIAGNOSIS — F0390 Unspecified dementia without behavioral disturbance: Secondary | ICD-10-CM | POA: Diagnosis not present

## 2016-03-23 DIAGNOSIS — F0390 Unspecified dementia without behavioral disturbance: Secondary | ICD-10-CM | POA: Diagnosis not present

## 2016-03-24 DIAGNOSIS — F0390 Unspecified dementia without behavioral disturbance: Secondary | ICD-10-CM | POA: Diagnosis not present

## 2016-03-25 DIAGNOSIS — F0390 Unspecified dementia without behavioral disturbance: Secondary | ICD-10-CM | POA: Diagnosis not present

## 2016-03-26 DIAGNOSIS — F0390 Unspecified dementia without behavioral disturbance: Secondary | ICD-10-CM | POA: Diagnosis not present

## 2016-03-27 DIAGNOSIS — F0390 Unspecified dementia without behavioral disturbance: Secondary | ICD-10-CM | POA: Diagnosis not present

## 2016-03-28 DIAGNOSIS — F0390 Unspecified dementia without behavioral disturbance: Secondary | ICD-10-CM | POA: Diagnosis not present

## 2016-03-29 DIAGNOSIS — F0390 Unspecified dementia without behavioral disturbance: Secondary | ICD-10-CM | POA: Diagnosis not present

## 2016-03-30 DIAGNOSIS — F0390 Unspecified dementia without behavioral disturbance: Secondary | ICD-10-CM | POA: Diagnosis not present

## 2016-03-31 DIAGNOSIS — F0390 Unspecified dementia without behavioral disturbance: Secondary | ICD-10-CM | POA: Diagnosis not present

## 2016-04-01 DIAGNOSIS — F0390 Unspecified dementia without behavioral disturbance: Secondary | ICD-10-CM | POA: Diagnosis not present

## 2016-04-02 DIAGNOSIS — F0390 Unspecified dementia without behavioral disturbance: Secondary | ICD-10-CM | POA: Diagnosis not present

## 2016-04-03 DIAGNOSIS — F0390 Unspecified dementia without behavioral disturbance: Secondary | ICD-10-CM | POA: Diagnosis not present

## 2016-04-04 DIAGNOSIS — F0390 Unspecified dementia without behavioral disturbance: Secondary | ICD-10-CM | POA: Diagnosis not present

## 2016-04-05 DIAGNOSIS — F0390 Unspecified dementia without behavioral disturbance: Secondary | ICD-10-CM | POA: Diagnosis not present

## 2016-04-06 DIAGNOSIS — F0390 Unspecified dementia without behavioral disturbance: Secondary | ICD-10-CM | POA: Diagnosis not present

## 2016-04-07 DIAGNOSIS — F0390 Unspecified dementia without behavioral disturbance: Secondary | ICD-10-CM | POA: Diagnosis not present

## 2016-04-08 DIAGNOSIS — F0390 Unspecified dementia without behavioral disturbance: Secondary | ICD-10-CM | POA: Diagnosis not present

## 2016-04-09 DIAGNOSIS — F0390 Unspecified dementia without behavioral disturbance: Secondary | ICD-10-CM | POA: Diagnosis not present

## 2016-04-10 DIAGNOSIS — F0390 Unspecified dementia without behavioral disturbance: Secondary | ICD-10-CM | POA: Diagnosis not present

## 2016-04-11 DIAGNOSIS — F0390 Unspecified dementia without behavioral disturbance: Secondary | ICD-10-CM | POA: Diagnosis not present

## 2016-04-12 DIAGNOSIS — F0390 Unspecified dementia without behavioral disturbance: Secondary | ICD-10-CM | POA: Diagnosis not present

## 2016-04-13 DIAGNOSIS — F0390 Unspecified dementia without behavioral disturbance: Secondary | ICD-10-CM | POA: Diagnosis not present

## 2016-04-14 DIAGNOSIS — F0390 Unspecified dementia without behavioral disturbance: Secondary | ICD-10-CM | POA: Diagnosis not present

## 2016-04-15 DIAGNOSIS — F0390 Unspecified dementia without behavioral disturbance: Secondary | ICD-10-CM | POA: Diagnosis not present

## 2016-04-16 DIAGNOSIS — F0390 Unspecified dementia without behavioral disturbance: Secondary | ICD-10-CM | POA: Diagnosis not present

## 2016-04-17 DIAGNOSIS — F0390 Unspecified dementia without behavioral disturbance: Secondary | ICD-10-CM | POA: Diagnosis not present

## 2016-04-18 DIAGNOSIS — F0390 Unspecified dementia without behavioral disturbance: Secondary | ICD-10-CM | POA: Diagnosis not present

## 2016-04-19 DIAGNOSIS — F0390 Unspecified dementia without behavioral disturbance: Secondary | ICD-10-CM | POA: Diagnosis not present

## 2016-04-20 DIAGNOSIS — F0390 Unspecified dementia without behavioral disturbance: Secondary | ICD-10-CM | POA: Diagnosis not present

## 2016-04-21 DIAGNOSIS — F0390 Unspecified dementia without behavioral disturbance: Secondary | ICD-10-CM | POA: Diagnosis not present

## 2016-04-22 DIAGNOSIS — F0390 Unspecified dementia without behavioral disturbance: Secondary | ICD-10-CM | POA: Diagnosis not present

## 2016-04-23 DIAGNOSIS — F0390 Unspecified dementia without behavioral disturbance: Secondary | ICD-10-CM | POA: Diagnosis not present

## 2016-04-24 DIAGNOSIS — F0390 Unspecified dementia without behavioral disturbance: Secondary | ICD-10-CM | POA: Diagnosis not present

## 2016-04-25 DIAGNOSIS — F0390 Unspecified dementia without behavioral disturbance: Secondary | ICD-10-CM | POA: Diagnosis not present

## 2016-04-26 DIAGNOSIS — F0390 Unspecified dementia without behavioral disturbance: Secondary | ICD-10-CM | POA: Diagnosis not present

## 2016-04-27 DIAGNOSIS — F0390 Unspecified dementia without behavioral disturbance: Secondary | ICD-10-CM | POA: Diagnosis not present

## 2016-04-28 DIAGNOSIS — F0390 Unspecified dementia without behavioral disturbance: Secondary | ICD-10-CM | POA: Diagnosis not present

## 2016-04-29 DIAGNOSIS — F0390 Unspecified dementia without behavioral disturbance: Secondary | ICD-10-CM | POA: Diagnosis not present

## 2016-04-30 DIAGNOSIS — F0390 Unspecified dementia without behavioral disturbance: Secondary | ICD-10-CM | POA: Diagnosis not present

## 2016-05-01 DIAGNOSIS — F0390 Unspecified dementia without behavioral disturbance: Secondary | ICD-10-CM | POA: Diagnosis not present

## 2016-05-02 DIAGNOSIS — F0390 Unspecified dementia without behavioral disturbance: Secondary | ICD-10-CM | POA: Diagnosis not present

## 2016-05-03 DIAGNOSIS — F0390 Unspecified dementia without behavioral disturbance: Secondary | ICD-10-CM | POA: Diagnosis not present

## 2016-05-04 DIAGNOSIS — F0390 Unspecified dementia without behavioral disturbance: Secondary | ICD-10-CM | POA: Diagnosis not present

## 2016-05-05 DIAGNOSIS — F0390 Unspecified dementia without behavioral disturbance: Secondary | ICD-10-CM | POA: Diagnosis not present

## 2016-05-06 DIAGNOSIS — F0390 Unspecified dementia without behavioral disturbance: Secondary | ICD-10-CM | POA: Diagnosis not present

## 2016-05-07 DIAGNOSIS — F0390 Unspecified dementia without behavioral disturbance: Secondary | ICD-10-CM | POA: Diagnosis not present

## 2016-05-08 DIAGNOSIS — F0390 Unspecified dementia without behavioral disturbance: Secondary | ICD-10-CM | POA: Diagnosis not present

## 2016-05-09 DIAGNOSIS — F0390 Unspecified dementia without behavioral disturbance: Secondary | ICD-10-CM | POA: Diagnosis not present

## 2016-05-10 DIAGNOSIS — F0390 Unspecified dementia without behavioral disturbance: Secondary | ICD-10-CM | POA: Diagnosis not present

## 2016-05-11 DIAGNOSIS — F0390 Unspecified dementia without behavioral disturbance: Secondary | ICD-10-CM | POA: Diagnosis not present

## 2016-05-12 DIAGNOSIS — F0390 Unspecified dementia without behavioral disturbance: Secondary | ICD-10-CM | POA: Diagnosis not present

## 2016-05-13 DIAGNOSIS — F0390 Unspecified dementia without behavioral disturbance: Secondary | ICD-10-CM | POA: Diagnosis not present

## 2016-05-14 DIAGNOSIS — F0390 Unspecified dementia without behavioral disturbance: Secondary | ICD-10-CM | POA: Diagnosis not present

## 2016-05-15 DIAGNOSIS — F0390 Unspecified dementia without behavioral disturbance: Secondary | ICD-10-CM | POA: Diagnosis not present

## 2016-05-16 DIAGNOSIS — F0390 Unspecified dementia without behavioral disturbance: Secondary | ICD-10-CM | POA: Diagnosis not present

## 2016-05-17 DIAGNOSIS — F0390 Unspecified dementia without behavioral disturbance: Secondary | ICD-10-CM | POA: Diagnosis not present

## 2016-05-18 DIAGNOSIS — F0390 Unspecified dementia without behavioral disturbance: Secondary | ICD-10-CM | POA: Diagnosis not present

## 2016-05-19 DIAGNOSIS — F0390 Unspecified dementia without behavioral disturbance: Secondary | ICD-10-CM | POA: Diagnosis not present

## 2016-05-20 DIAGNOSIS — F0390 Unspecified dementia without behavioral disturbance: Secondary | ICD-10-CM | POA: Diagnosis not present

## 2016-05-21 DIAGNOSIS — F0390 Unspecified dementia without behavioral disturbance: Secondary | ICD-10-CM | POA: Diagnosis not present

## 2016-05-22 DIAGNOSIS — F0390 Unspecified dementia without behavioral disturbance: Secondary | ICD-10-CM | POA: Diagnosis not present

## 2016-05-23 DIAGNOSIS — F0390 Unspecified dementia without behavioral disturbance: Secondary | ICD-10-CM | POA: Diagnosis not present

## 2016-05-24 DIAGNOSIS — F0390 Unspecified dementia without behavioral disturbance: Secondary | ICD-10-CM | POA: Diagnosis not present

## 2016-05-25 DIAGNOSIS — F0390 Unspecified dementia without behavioral disturbance: Secondary | ICD-10-CM | POA: Diagnosis not present

## 2016-05-26 DIAGNOSIS — F0390 Unspecified dementia without behavioral disturbance: Secondary | ICD-10-CM | POA: Diagnosis not present

## 2016-05-27 DIAGNOSIS — F0390 Unspecified dementia without behavioral disturbance: Secondary | ICD-10-CM | POA: Diagnosis not present

## 2016-05-28 DIAGNOSIS — F0390 Unspecified dementia without behavioral disturbance: Secondary | ICD-10-CM | POA: Diagnosis not present

## 2016-05-29 DIAGNOSIS — F0390 Unspecified dementia without behavioral disturbance: Secondary | ICD-10-CM | POA: Diagnosis not present

## 2016-05-30 DIAGNOSIS — F0390 Unspecified dementia without behavioral disturbance: Secondary | ICD-10-CM | POA: Diagnosis not present

## 2016-05-31 DIAGNOSIS — F0390 Unspecified dementia without behavioral disturbance: Secondary | ICD-10-CM | POA: Diagnosis not present

## 2016-06-01 DIAGNOSIS — F0390 Unspecified dementia without behavioral disturbance: Secondary | ICD-10-CM | POA: Diagnosis not present

## 2016-06-02 DIAGNOSIS — F0390 Unspecified dementia without behavioral disturbance: Secondary | ICD-10-CM | POA: Diagnosis not present

## 2016-06-03 DIAGNOSIS — F0390 Unspecified dementia without behavioral disturbance: Secondary | ICD-10-CM | POA: Diagnosis not present

## 2016-06-04 DIAGNOSIS — F0390 Unspecified dementia without behavioral disturbance: Secondary | ICD-10-CM | POA: Diagnosis not present

## 2016-06-05 DIAGNOSIS — F0390 Unspecified dementia without behavioral disturbance: Secondary | ICD-10-CM | POA: Diagnosis not present

## 2016-06-06 DIAGNOSIS — F0390 Unspecified dementia without behavioral disturbance: Secondary | ICD-10-CM | POA: Diagnosis not present

## 2016-06-07 DIAGNOSIS — F0390 Unspecified dementia without behavioral disturbance: Secondary | ICD-10-CM | POA: Diagnosis not present

## 2016-06-08 DIAGNOSIS — F0390 Unspecified dementia without behavioral disturbance: Secondary | ICD-10-CM | POA: Diagnosis not present

## 2016-06-09 DIAGNOSIS — F0390 Unspecified dementia without behavioral disturbance: Secondary | ICD-10-CM | POA: Diagnosis not present

## 2016-06-10 DIAGNOSIS — F0390 Unspecified dementia without behavioral disturbance: Secondary | ICD-10-CM | POA: Diagnosis not present

## 2016-06-11 DIAGNOSIS — F0390 Unspecified dementia without behavioral disturbance: Secondary | ICD-10-CM | POA: Diagnosis not present

## 2016-06-12 DIAGNOSIS — F0390 Unspecified dementia without behavioral disturbance: Secondary | ICD-10-CM | POA: Diagnosis not present

## 2016-06-13 DIAGNOSIS — F0390 Unspecified dementia without behavioral disturbance: Secondary | ICD-10-CM | POA: Diagnosis not present

## 2016-06-14 DIAGNOSIS — F0390 Unspecified dementia without behavioral disturbance: Secondary | ICD-10-CM | POA: Diagnosis not present

## 2016-06-15 DIAGNOSIS — F0390 Unspecified dementia without behavioral disturbance: Secondary | ICD-10-CM | POA: Diagnosis not present

## 2016-06-16 DIAGNOSIS — F0390 Unspecified dementia without behavioral disturbance: Secondary | ICD-10-CM | POA: Diagnosis not present

## 2016-06-17 DIAGNOSIS — F0390 Unspecified dementia without behavioral disturbance: Secondary | ICD-10-CM | POA: Diagnosis not present

## 2016-06-18 DIAGNOSIS — F0390 Unspecified dementia without behavioral disturbance: Secondary | ICD-10-CM | POA: Diagnosis not present

## 2016-06-19 DIAGNOSIS — F0390 Unspecified dementia without behavioral disturbance: Secondary | ICD-10-CM | POA: Diagnosis not present

## 2016-06-20 DIAGNOSIS — F0390 Unspecified dementia without behavioral disturbance: Secondary | ICD-10-CM | POA: Diagnosis not present

## 2016-06-21 DIAGNOSIS — F0390 Unspecified dementia without behavioral disturbance: Secondary | ICD-10-CM | POA: Diagnosis not present

## 2016-06-22 DIAGNOSIS — F0390 Unspecified dementia without behavioral disturbance: Secondary | ICD-10-CM | POA: Diagnosis not present

## 2016-06-23 DIAGNOSIS — F0390 Unspecified dementia without behavioral disturbance: Secondary | ICD-10-CM | POA: Diagnosis not present

## 2016-06-24 DIAGNOSIS — F0390 Unspecified dementia without behavioral disturbance: Secondary | ICD-10-CM | POA: Diagnosis not present

## 2016-06-25 DIAGNOSIS — F0390 Unspecified dementia without behavioral disturbance: Secondary | ICD-10-CM | POA: Diagnosis not present

## 2016-06-26 DIAGNOSIS — F0390 Unspecified dementia without behavioral disturbance: Secondary | ICD-10-CM | POA: Diagnosis not present

## 2016-06-27 DIAGNOSIS — F0390 Unspecified dementia without behavioral disturbance: Secondary | ICD-10-CM | POA: Diagnosis not present

## 2016-06-28 DIAGNOSIS — F0390 Unspecified dementia without behavioral disturbance: Secondary | ICD-10-CM | POA: Diagnosis not present

## 2016-06-29 DIAGNOSIS — F0390 Unspecified dementia without behavioral disturbance: Secondary | ICD-10-CM | POA: Diagnosis not present

## 2016-06-30 DIAGNOSIS — F0390 Unspecified dementia without behavioral disturbance: Secondary | ICD-10-CM | POA: Diagnosis not present

## 2016-07-01 DIAGNOSIS — F0390 Unspecified dementia without behavioral disturbance: Secondary | ICD-10-CM | POA: Diagnosis not present

## 2016-07-02 DIAGNOSIS — F0390 Unspecified dementia without behavioral disturbance: Secondary | ICD-10-CM | POA: Diagnosis not present

## 2016-07-03 DIAGNOSIS — F0390 Unspecified dementia without behavioral disturbance: Secondary | ICD-10-CM | POA: Diagnosis not present

## 2016-07-04 DIAGNOSIS — F0390 Unspecified dementia without behavioral disturbance: Secondary | ICD-10-CM | POA: Diagnosis not present

## 2016-07-05 DIAGNOSIS — F0390 Unspecified dementia without behavioral disturbance: Secondary | ICD-10-CM | POA: Diagnosis not present

## 2016-07-06 DIAGNOSIS — F0390 Unspecified dementia without behavioral disturbance: Secondary | ICD-10-CM | POA: Diagnosis not present

## 2016-07-10 DIAGNOSIS — F0390 Unspecified dementia without behavioral disturbance: Secondary | ICD-10-CM | POA: Diagnosis not present

## 2016-07-11 DIAGNOSIS — F0390 Unspecified dementia without behavioral disturbance: Secondary | ICD-10-CM | POA: Diagnosis not present

## 2016-07-12 DIAGNOSIS — F0390 Unspecified dementia without behavioral disturbance: Secondary | ICD-10-CM | POA: Diagnosis not present

## 2016-07-13 DIAGNOSIS — F0390 Unspecified dementia without behavioral disturbance: Secondary | ICD-10-CM | POA: Diagnosis not present

## 2016-07-14 DIAGNOSIS — F0390 Unspecified dementia without behavioral disturbance: Secondary | ICD-10-CM | POA: Diagnosis not present

## 2016-07-15 DIAGNOSIS — F0390 Unspecified dementia without behavioral disturbance: Secondary | ICD-10-CM | POA: Diagnosis not present

## 2016-07-16 DIAGNOSIS — F0390 Unspecified dementia without behavioral disturbance: Secondary | ICD-10-CM | POA: Diagnosis not present

## 2016-07-17 DIAGNOSIS — F0390 Unspecified dementia without behavioral disturbance: Secondary | ICD-10-CM | POA: Diagnosis not present

## 2016-07-18 DIAGNOSIS — F0390 Unspecified dementia without behavioral disturbance: Secondary | ICD-10-CM | POA: Diagnosis not present

## 2016-07-19 DIAGNOSIS — F0390 Unspecified dementia without behavioral disturbance: Secondary | ICD-10-CM | POA: Diagnosis not present

## 2016-07-20 DIAGNOSIS — F0390 Unspecified dementia without behavioral disturbance: Secondary | ICD-10-CM | POA: Diagnosis not present

## 2016-07-21 DIAGNOSIS — F0390 Unspecified dementia without behavioral disturbance: Secondary | ICD-10-CM | POA: Diagnosis not present

## 2016-07-22 DIAGNOSIS — F0390 Unspecified dementia without behavioral disturbance: Secondary | ICD-10-CM | POA: Diagnosis not present

## 2016-07-23 DIAGNOSIS — F0390 Unspecified dementia without behavioral disturbance: Secondary | ICD-10-CM | POA: Diagnosis not present

## 2016-07-24 DIAGNOSIS — F0390 Unspecified dementia without behavioral disturbance: Secondary | ICD-10-CM | POA: Diagnosis not present

## 2016-07-25 DIAGNOSIS — F0390 Unspecified dementia without behavioral disturbance: Secondary | ICD-10-CM | POA: Diagnosis not present

## 2016-07-26 DIAGNOSIS — F0390 Unspecified dementia without behavioral disturbance: Secondary | ICD-10-CM | POA: Diagnosis not present

## 2016-07-27 DIAGNOSIS — F0390 Unspecified dementia without behavioral disturbance: Secondary | ICD-10-CM | POA: Diagnosis not present

## 2016-07-28 DIAGNOSIS — F0390 Unspecified dementia without behavioral disturbance: Secondary | ICD-10-CM | POA: Diagnosis not present

## 2016-07-29 DIAGNOSIS — F0390 Unspecified dementia without behavioral disturbance: Secondary | ICD-10-CM | POA: Diagnosis not present

## 2016-07-30 DIAGNOSIS — F0390 Unspecified dementia without behavioral disturbance: Secondary | ICD-10-CM | POA: Diagnosis not present

## 2016-07-31 DIAGNOSIS — F0390 Unspecified dementia without behavioral disturbance: Secondary | ICD-10-CM | POA: Diagnosis not present

## 2016-08-01 DIAGNOSIS — F0390 Unspecified dementia without behavioral disturbance: Secondary | ICD-10-CM | POA: Diagnosis not present

## 2016-08-02 DIAGNOSIS — F0390 Unspecified dementia without behavioral disturbance: Secondary | ICD-10-CM | POA: Diagnosis not present

## 2016-08-03 DIAGNOSIS — F0390 Unspecified dementia without behavioral disturbance: Secondary | ICD-10-CM | POA: Diagnosis not present

## 2016-08-04 DIAGNOSIS — F0390 Unspecified dementia without behavioral disturbance: Secondary | ICD-10-CM | POA: Diagnosis not present

## 2016-08-05 DIAGNOSIS — F0390 Unspecified dementia without behavioral disturbance: Secondary | ICD-10-CM | POA: Diagnosis not present

## 2016-08-06 DIAGNOSIS — F0390 Unspecified dementia without behavioral disturbance: Secondary | ICD-10-CM | POA: Diagnosis not present

## 2016-08-07 DIAGNOSIS — F0390 Unspecified dementia without behavioral disturbance: Secondary | ICD-10-CM | POA: Diagnosis not present

## 2016-08-08 DIAGNOSIS — F0390 Unspecified dementia without behavioral disturbance: Secondary | ICD-10-CM | POA: Diagnosis not present

## 2016-08-09 DIAGNOSIS — F0390 Unspecified dementia without behavioral disturbance: Secondary | ICD-10-CM | POA: Diagnosis not present

## 2016-08-10 DIAGNOSIS — F0390 Unspecified dementia without behavioral disturbance: Secondary | ICD-10-CM | POA: Diagnosis not present

## 2016-08-11 DIAGNOSIS — F0390 Unspecified dementia without behavioral disturbance: Secondary | ICD-10-CM | POA: Diagnosis not present

## 2016-08-12 DIAGNOSIS — F0390 Unspecified dementia without behavioral disturbance: Secondary | ICD-10-CM | POA: Diagnosis not present

## 2016-08-13 DIAGNOSIS — F0390 Unspecified dementia without behavioral disturbance: Secondary | ICD-10-CM | POA: Diagnosis not present

## 2016-08-14 DIAGNOSIS — F0390 Unspecified dementia without behavioral disturbance: Secondary | ICD-10-CM | POA: Diagnosis not present

## 2016-08-15 DIAGNOSIS — F0390 Unspecified dementia without behavioral disturbance: Secondary | ICD-10-CM | POA: Diagnosis not present

## 2016-08-16 DIAGNOSIS — F0390 Unspecified dementia without behavioral disturbance: Secondary | ICD-10-CM | POA: Diagnosis not present

## 2016-08-17 DIAGNOSIS — F0390 Unspecified dementia without behavioral disturbance: Secondary | ICD-10-CM | POA: Diagnosis not present

## 2016-08-18 DIAGNOSIS — F0390 Unspecified dementia without behavioral disturbance: Secondary | ICD-10-CM | POA: Diagnosis not present

## 2016-08-19 DIAGNOSIS — F0390 Unspecified dementia without behavioral disturbance: Secondary | ICD-10-CM | POA: Diagnosis not present

## 2016-08-20 DIAGNOSIS — F0390 Unspecified dementia without behavioral disturbance: Secondary | ICD-10-CM | POA: Diagnosis not present

## 2016-08-21 DIAGNOSIS — F0390 Unspecified dementia without behavioral disturbance: Secondary | ICD-10-CM | POA: Diagnosis not present

## 2016-08-22 DIAGNOSIS — F0390 Unspecified dementia without behavioral disturbance: Secondary | ICD-10-CM | POA: Diagnosis not present

## 2016-08-23 DIAGNOSIS — F0390 Unspecified dementia without behavioral disturbance: Secondary | ICD-10-CM | POA: Diagnosis not present

## 2016-08-24 DIAGNOSIS — F0390 Unspecified dementia without behavioral disturbance: Secondary | ICD-10-CM | POA: Diagnosis not present

## 2016-08-25 DIAGNOSIS — F0390 Unspecified dementia without behavioral disturbance: Secondary | ICD-10-CM | POA: Diagnosis not present

## 2016-08-26 DIAGNOSIS — F0390 Unspecified dementia without behavioral disturbance: Secondary | ICD-10-CM | POA: Diagnosis not present

## 2016-08-27 DIAGNOSIS — F0390 Unspecified dementia without behavioral disturbance: Secondary | ICD-10-CM | POA: Diagnosis not present

## 2016-08-28 DIAGNOSIS — F0390 Unspecified dementia without behavioral disturbance: Secondary | ICD-10-CM | POA: Diagnosis not present

## 2016-08-29 DIAGNOSIS — F0390 Unspecified dementia without behavioral disturbance: Secondary | ICD-10-CM | POA: Diagnosis not present

## 2016-08-30 DIAGNOSIS — F0390 Unspecified dementia without behavioral disturbance: Secondary | ICD-10-CM | POA: Diagnosis not present

## 2016-08-31 DIAGNOSIS — F0390 Unspecified dementia without behavioral disturbance: Secondary | ICD-10-CM | POA: Diagnosis not present

## 2016-09-01 DIAGNOSIS — F0390 Unspecified dementia without behavioral disturbance: Secondary | ICD-10-CM | POA: Diagnosis not present

## 2016-09-02 DIAGNOSIS — F0390 Unspecified dementia without behavioral disturbance: Secondary | ICD-10-CM | POA: Diagnosis not present

## 2016-09-03 DIAGNOSIS — F0390 Unspecified dementia without behavioral disturbance: Secondary | ICD-10-CM | POA: Diagnosis not present

## 2016-09-04 DIAGNOSIS — F0390 Unspecified dementia without behavioral disturbance: Secondary | ICD-10-CM | POA: Diagnosis not present

## 2016-09-05 DIAGNOSIS — F0390 Unspecified dementia without behavioral disturbance: Secondary | ICD-10-CM | POA: Diagnosis not present

## 2016-09-06 DIAGNOSIS — F0390 Unspecified dementia without behavioral disturbance: Secondary | ICD-10-CM | POA: Diagnosis not present

## 2016-09-07 DIAGNOSIS — F0390 Unspecified dementia without behavioral disturbance: Secondary | ICD-10-CM | POA: Diagnosis not present

## 2016-09-08 DIAGNOSIS — F0390 Unspecified dementia without behavioral disturbance: Secondary | ICD-10-CM | POA: Diagnosis not present

## 2016-09-09 DIAGNOSIS — F0390 Unspecified dementia without behavioral disturbance: Secondary | ICD-10-CM | POA: Diagnosis not present

## 2016-09-10 DIAGNOSIS — F0390 Unspecified dementia without behavioral disturbance: Secondary | ICD-10-CM | POA: Diagnosis not present

## 2016-09-11 DIAGNOSIS — F0390 Unspecified dementia without behavioral disturbance: Secondary | ICD-10-CM | POA: Diagnosis not present

## 2016-09-12 DIAGNOSIS — F0390 Unspecified dementia without behavioral disturbance: Secondary | ICD-10-CM | POA: Diagnosis not present

## 2016-09-13 DIAGNOSIS — F0390 Unspecified dementia without behavioral disturbance: Secondary | ICD-10-CM | POA: Diagnosis not present

## 2016-09-14 DIAGNOSIS — F0390 Unspecified dementia without behavioral disturbance: Secondary | ICD-10-CM | POA: Diagnosis not present

## 2016-09-15 DIAGNOSIS — F0390 Unspecified dementia without behavioral disturbance: Secondary | ICD-10-CM | POA: Diagnosis not present

## 2016-09-18 DIAGNOSIS — F0390 Unspecified dementia without behavioral disturbance: Secondary | ICD-10-CM | POA: Diagnosis not present

## 2016-09-19 DIAGNOSIS — F0390 Unspecified dementia without behavioral disturbance: Secondary | ICD-10-CM | POA: Diagnosis not present

## 2016-09-20 DIAGNOSIS — F0390 Unspecified dementia without behavioral disturbance: Secondary | ICD-10-CM | POA: Diagnosis not present

## 2016-09-21 DIAGNOSIS — F0390 Unspecified dementia without behavioral disturbance: Secondary | ICD-10-CM | POA: Diagnosis not present

## 2016-09-22 DIAGNOSIS — F0390 Unspecified dementia without behavioral disturbance: Secondary | ICD-10-CM | POA: Diagnosis not present

## 2016-09-25 DIAGNOSIS — F0390 Unspecified dementia without behavioral disturbance: Secondary | ICD-10-CM | POA: Diagnosis not present

## 2016-09-26 DIAGNOSIS — F0390 Unspecified dementia without behavioral disturbance: Secondary | ICD-10-CM | POA: Diagnosis not present

## 2016-09-27 DIAGNOSIS — F0390 Unspecified dementia without behavioral disturbance: Secondary | ICD-10-CM | POA: Diagnosis not present

## 2016-09-28 DIAGNOSIS — F0390 Unspecified dementia without behavioral disturbance: Secondary | ICD-10-CM | POA: Diagnosis not present

## 2016-09-29 DIAGNOSIS — F0390 Unspecified dementia without behavioral disturbance: Secondary | ICD-10-CM | POA: Diagnosis not present

## 2016-10-02 DIAGNOSIS — F0390 Unspecified dementia without behavioral disturbance: Secondary | ICD-10-CM | POA: Diagnosis not present

## 2016-10-03 DIAGNOSIS — F0390 Unspecified dementia without behavioral disturbance: Secondary | ICD-10-CM | POA: Diagnosis not present

## 2016-10-04 DIAGNOSIS — F0390 Unspecified dementia without behavioral disturbance: Secondary | ICD-10-CM | POA: Diagnosis not present

## 2016-10-05 DIAGNOSIS — F0390 Unspecified dementia without behavioral disturbance: Secondary | ICD-10-CM | POA: Diagnosis not present

## 2016-10-06 DIAGNOSIS — F0390 Unspecified dementia without behavioral disturbance: Secondary | ICD-10-CM | POA: Diagnosis not present

## 2016-10-09 DIAGNOSIS — F0390 Unspecified dementia without behavioral disturbance: Secondary | ICD-10-CM | POA: Diagnosis not present

## 2016-10-10 DIAGNOSIS — F0390 Unspecified dementia without behavioral disturbance: Secondary | ICD-10-CM | POA: Diagnosis not present

## 2016-10-11 DIAGNOSIS — F0390 Unspecified dementia without behavioral disturbance: Secondary | ICD-10-CM | POA: Diagnosis not present

## 2016-10-12 DIAGNOSIS — F0390 Unspecified dementia without behavioral disturbance: Secondary | ICD-10-CM | POA: Diagnosis not present

## 2016-10-13 DIAGNOSIS — F0390 Unspecified dementia without behavioral disturbance: Secondary | ICD-10-CM | POA: Diagnosis not present

## 2016-10-16 DIAGNOSIS — F0390 Unspecified dementia without behavioral disturbance: Secondary | ICD-10-CM | POA: Diagnosis not present

## 2016-10-17 DIAGNOSIS — F0390 Unspecified dementia without behavioral disturbance: Secondary | ICD-10-CM | POA: Diagnosis not present

## 2016-10-18 DIAGNOSIS — F0390 Unspecified dementia without behavioral disturbance: Secondary | ICD-10-CM | POA: Diagnosis not present

## 2016-10-19 DIAGNOSIS — F0390 Unspecified dementia without behavioral disturbance: Secondary | ICD-10-CM | POA: Diagnosis not present

## 2016-10-20 DIAGNOSIS — F0390 Unspecified dementia without behavioral disturbance: Secondary | ICD-10-CM | POA: Diagnosis not present

## 2016-10-23 DIAGNOSIS — F0390 Unspecified dementia without behavioral disturbance: Secondary | ICD-10-CM | POA: Diagnosis not present

## 2016-10-24 DIAGNOSIS — F0390 Unspecified dementia without behavioral disturbance: Secondary | ICD-10-CM | POA: Diagnosis not present

## 2016-10-25 DIAGNOSIS — F0390 Unspecified dementia without behavioral disturbance: Secondary | ICD-10-CM | POA: Diagnosis not present

## 2016-10-26 DIAGNOSIS — F0390 Unspecified dementia without behavioral disturbance: Secondary | ICD-10-CM | POA: Diagnosis not present

## 2016-10-27 DIAGNOSIS — F0390 Unspecified dementia without behavioral disturbance: Secondary | ICD-10-CM | POA: Diagnosis not present

## 2016-10-30 DIAGNOSIS — F0390 Unspecified dementia without behavioral disturbance: Secondary | ICD-10-CM | POA: Diagnosis not present

## 2016-10-31 DIAGNOSIS — F0390 Unspecified dementia without behavioral disturbance: Secondary | ICD-10-CM | POA: Diagnosis not present

## 2016-11-01 DIAGNOSIS — F0390 Unspecified dementia without behavioral disturbance: Secondary | ICD-10-CM | POA: Diagnosis not present

## 2016-11-02 DIAGNOSIS — F0390 Unspecified dementia without behavioral disturbance: Secondary | ICD-10-CM | POA: Diagnosis not present

## 2016-11-03 DIAGNOSIS — F0390 Unspecified dementia without behavioral disturbance: Secondary | ICD-10-CM | POA: Diagnosis not present

## 2016-11-06 DIAGNOSIS — F0390 Unspecified dementia without behavioral disturbance: Secondary | ICD-10-CM | POA: Diagnosis not present

## 2016-11-07 DIAGNOSIS — F0390 Unspecified dementia without behavioral disturbance: Secondary | ICD-10-CM | POA: Diagnosis not present

## 2016-11-08 DIAGNOSIS — F0390 Unspecified dementia without behavioral disturbance: Secondary | ICD-10-CM | POA: Diagnosis not present

## 2016-11-09 DIAGNOSIS — F0390 Unspecified dementia without behavioral disturbance: Secondary | ICD-10-CM | POA: Diagnosis not present

## 2016-11-10 DIAGNOSIS — F0390 Unspecified dementia without behavioral disturbance: Secondary | ICD-10-CM | POA: Diagnosis not present

## 2016-11-13 DIAGNOSIS — F0390 Unspecified dementia without behavioral disturbance: Secondary | ICD-10-CM | POA: Diagnosis not present

## 2016-11-14 DIAGNOSIS — F0390 Unspecified dementia without behavioral disturbance: Secondary | ICD-10-CM | POA: Diagnosis not present

## 2016-11-15 DIAGNOSIS — F0390 Unspecified dementia without behavioral disturbance: Secondary | ICD-10-CM | POA: Diagnosis not present

## 2016-11-16 DIAGNOSIS — F0390 Unspecified dementia without behavioral disturbance: Secondary | ICD-10-CM | POA: Diagnosis not present

## 2016-11-17 DIAGNOSIS — F0390 Unspecified dementia without behavioral disturbance: Secondary | ICD-10-CM | POA: Diagnosis not present

## 2016-11-20 DIAGNOSIS — F0390 Unspecified dementia without behavioral disturbance: Secondary | ICD-10-CM | POA: Diagnosis not present

## 2016-11-21 DIAGNOSIS — F0390 Unspecified dementia without behavioral disturbance: Secondary | ICD-10-CM | POA: Diagnosis not present

## 2016-11-22 DIAGNOSIS — F0390 Unspecified dementia without behavioral disturbance: Secondary | ICD-10-CM | POA: Diagnosis not present

## 2016-11-23 DIAGNOSIS — F0390 Unspecified dementia without behavioral disturbance: Secondary | ICD-10-CM | POA: Diagnosis not present

## 2016-11-24 DIAGNOSIS — F0390 Unspecified dementia without behavioral disturbance: Secondary | ICD-10-CM | POA: Diagnosis not present

## 2016-11-27 DIAGNOSIS — F0390 Unspecified dementia without behavioral disturbance: Secondary | ICD-10-CM | POA: Diagnosis not present

## 2016-11-28 DIAGNOSIS — F0390 Unspecified dementia without behavioral disturbance: Secondary | ICD-10-CM | POA: Diagnosis not present

## 2016-11-29 DIAGNOSIS — F0390 Unspecified dementia without behavioral disturbance: Secondary | ICD-10-CM | POA: Diagnosis not present

## 2016-11-30 DIAGNOSIS — F0390 Unspecified dementia without behavioral disturbance: Secondary | ICD-10-CM | POA: Diagnosis not present

## 2016-12-01 DIAGNOSIS — F0390 Unspecified dementia without behavioral disturbance: Secondary | ICD-10-CM | POA: Diagnosis not present

## 2016-12-04 DIAGNOSIS — F0390 Unspecified dementia without behavioral disturbance: Secondary | ICD-10-CM | POA: Diagnosis not present

## 2016-12-05 DIAGNOSIS — F0390 Unspecified dementia without behavioral disturbance: Secondary | ICD-10-CM | POA: Diagnosis not present

## 2016-12-06 DIAGNOSIS — F0390 Unspecified dementia without behavioral disturbance: Secondary | ICD-10-CM | POA: Diagnosis not present

## 2016-12-07 DIAGNOSIS — F0390 Unspecified dementia without behavioral disturbance: Secondary | ICD-10-CM | POA: Diagnosis not present

## 2016-12-08 DIAGNOSIS — F0390 Unspecified dementia without behavioral disturbance: Secondary | ICD-10-CM | POA: Diagnosis not present

## 2016-12-11 DIAGNOSIS — F0390 Unspecified dementia without behavioral disturbance: Secondary | ICD-10-CM | POA: Diagnosis not present

## 2016-12-12 DIAGNOSIS — F0390 Unspecified dementia without behavioral disturbance: Secondary | ICD-10-CM | POA: Diagnosis not present

## 2016-12-13 DIAGNOSIS — F0390 Unspecified dementia without behavioral disturbance: Secondary | ICD-10-CM | POA: Diagnosis not present

## 2016-12-14 DIAGNOSIS — F0390 Unspecified dementia without behavioral disturbance: Secondary | ICD-10-CM | POA: Diagnosis not present

## 2016-12-15 DIAGNOSIS — F0390 Unspecified dementia without behavioral disturbance: Secondary | ICD-10-CM | POA: Diagnosis not present

## 2016-12-18 DIAGNOSIS — F0390 Unspecified dementia without behavioral disturbance: Secondary | ICD-10-CM | POA: Diagnosis not present

## 2016-12-19 DIAGNOSIS — F0390 Unspecified dementia without behavioral disturbance: Secondary | ICD-10-CM | POA: Diagnosis not present

## 2016-12-20 DIAGNOSIS — F0390 Unspecified dementia without behavioral disturbance: Secondary | ICD-10-CM | POA: Diagnosis not present

## 2016-12-21 DIAGNOSIS — F0390 Unspecified dementia without behavioral disturbance: Secondary | ICD-10-CM | POA: Diagnosis not present

## 2016-12-22 DIAGNOSIS — F0390 Unspecified dementia without behavioral disturbance: Secondary | ICD-10-CM | POA: Diagnosis not present

## 2016-12-25 DIAGNOSIS — F0390 Unspecified dementia without behavioral disturbance: Secondary | ICD-10-CM | POA: Diagnosis not present

## 2016-12-26 DIAGNOSIS — F0390 Unspecified dementia without behavioral disturbance: Secondary | ICD-10-CM | POA: Diagnosis not present

## 2016-12-27 DIAGNOSIS — F0390 Unspecified dementia without behavioral disturbance: Secondary | ICD-10-CM | POA: Diagnosis not present

## 2016-12-28 DIAGNOSIS — F0390 Unspecified dementia without behavioral disturbance: Secondary | ICD-10-CM | POA: Diagnosis not present

## 2016-12-29 DIAGNOSIS — F0390 Unspecified dementia without behavioral disturbance: Secondary | ICD-10-CM | POA: Diagnosis not present

## 2017-01-01 DIAGNOSIS — F0390 Unspecified dementia without behavioral disturbance: Secondary | ICD-10-CM | POA: Diagnosis not present

## 2019-03-08 ENCOUNTER — Emergency Department (HOSPITAL_COMMUNITY): Payer: Medicare Other

## 2019-03-08 ENCOUNTER — Emergency Department (HOSPITAL_COMMUNITY)
Admission: EM | Admit: 2019-03-08 | Discharge: 2019-03-08 | Disposition: A | Discharge status: Home / Self Care | Payer: Medicare Other | Attending: Emergency Medicine | Admitting: Emergency Medicine

## 2019-03-08 DIAGNOSIS — R404 Transient alteration of awareness: Secondary | ICD-10-CM | POA: Diagnosis not present

## 2019-03-08 DIAGNOSIS — Z79899 Other long term (current) drug therapy: Secondary | ICD-10-CM | POA: Insufficient documentation

## 2019-03-08 DIAGNOSIS — F17210 Nicotine dependence, cigarettes, uncomplicated: Secondary | ICD-10-CM | POA: Diagnosis not present

## 2019-03-08 DIAGNOSIS — F10920 Alcohol use, unspecified with intoxication, uncomplicated: Secondary | ICD-10-CM | POA: Insufficient documentation

## 2019-03-08 DIAGNOSIS — Y905 Blood alcohol level of 100-119 mg/100 ml: Secondary | ICD-10-CM | POA: Diagnosis not present

## 2019-03-08 DIAGNOSIS — R4182 Altered mental status, unspecified: Secondary | ICD-10-CM | POA: Diagnosis present

## 2019-03-08 DIAGNOSIS — F1490 Cocaine use, unspecified, uncomplicated: Secondary | ICD-10-CM

## 2019-03-08 DIAGNOSIS — Z789 Other specified health status: Secondary | ICD-10-CM

## 2019-03-08 DIAGNOSIS — F1090 Alcohol use, unspecified, uncomplicated: Secondary | ICD-10-CM

## 2019-03-08 DIAGNOSIS — Z7289 Other problems related to lifestyle: Secondary | ICD-10-CM

## 2019-03-08 LAB — COMPREHENSIVE METABOLIC PANEL
ALT: 23 U/L (ref 0–44)
AST: 45 U/L — ABNORMAL HIGH (ref 15–41)
Albumin: 4 g/dL (ref 3.5–5.0)
Alkaline Phosphatase: 55 U/L (ref 38–126)
Anion gap: 12 (ref 5–15)
BUN: 8 mg/dL (ref 8–23)
CO2: 27 mmol/L (ref 22–32)
Calcium: 9.3 mg/dL (ref 8.9–10.3)
Chloride: 100 mmol/L (ref 98–111)
Creatinine, Ser: 1.16 mg/dL — ABNORMAL HIGH (ref 0.44–1.00)
GFR calc Af Amer: 54 mL/min — ABNORMAL LOW (ref >60)
GFR calc non Af Amer: 46 mL/min — ABNORMAL LOW (ref >60)
Glucose, Bld: 82 mg/dL (ref 70–99)
Potassium: 4.8 mmol/L (ref 3.5–5.1)
Sodium: 139 mmol/L (ref 135–145)
Total Bilirubin: 1.1 mg/dL (ref 0.3–1.2)
Total Protein: 6.6 g/dL (ref 6.5–8.1)

## 2019-03-08 LAB — URINALYSIS, ROUTINE W REFLEX MICROSCOPIC
Bilirubin Urine: NEGATIVE
Glucose, UA: NEGATIVE mg/dL
Ketones, ur: NEGATIVE mg/dL
Nitrite: NEGATIVE
Protein, ur: NEGATIVE mg/dL
Specific Gravity, Urine: 1.003 — ABNORMAL LOW (ref 1.005–1.030)
pH: 8 (ref 5.0–8.0)

## 2019-03-08 LAB — CBC WITH DIFFERENTIAL/PLATELET
Abs Immature Granulocytes: 0.01 10*3/uL (ref 0.00–0.07)
Basophils Absolute: 0 10*3/uL (ref 0.0–0.1)
Basophils Relative: 0 %
Eosinophils Absolute: 0 10*3/uL (ref 0.0–0.5)
Eosinophils Relative: 1 %
HCT: 45.3 % (ref 36.0–46.0)
Hemoglobin: 15.2 g/dL — ABNORMAL HIGH (ref 12.0–15.0)
Immature Granulocytes: 0 %
Lymphocytes Relative: 43 %
Lymphs Abs: 2.7 10*3/uL (ref 0.7–4.0)
MCH: 29 pg (ref 26.0–34.0)
MCHC: 33.6 g/dL (ref 30.0–36.0)
MCV: 86.3 fL (ref 80.0–100.0)
Monocytes Absolute: 0.5 10*3/uL (ref 0.1–1.0)
Monocytes Relative: 8 %
Neutro Abs: 3 10*3/uL (ref 1.7–7.7)
Neutrophils Relative %: 48 %
Platelets: 262 10*3/uL (ref 150–400)
RBC: 5.25 MIL/uL — ABNORMAL HIGH (ref 3.87–5.11)
RDW: 12.8 % (ref 11.5–15.5)
WBC: 6.3 10*3/uL (ref 4.0–10.5)
nRBC: 0 % (ref 0.0–0.2)

## 2019-03-08 LAB — RAPID URINE DRUG SCREEN, HOSP PERFORMED
Amphetamines: NOT DETECTED
Barbiturates: NOT DETECTED
Benzodiazepines: NOT DETECTED
Cocaine: POSITIVE — AB
Opiates: NOT DETECTED
Tetrahydrocannabinol: NOT DETECTED

## 2019-03-08 LAB — SALICYLATE LEVEL: Salicylate Lvl: <7 mg/dL — ABNORMAL LOW (ref 7.0–30.0)

## 2019-03-08 LAB — ACETAMINOPHEN LEVEL: Acetaminophen (Tylenol), Serum: <10 ug/mL — ABNORMAL LOW (ref 10–30)

## 2019-03-08 LAB — LIPASE, BLOOD: Lipase: 32 U/L (ref 11–51)

## 2019-03-08 LAB — ETHANOL: Alcohol, Ethyl (B): 112 mg/dL — ABNORMAL HIGH (ref <10)

## 2019-03-08 IMAGING — DX DG CHEST 2V
2 series · 2 of 2 positions shown · non-contrast
Comparison: [DATE]

CLINICAL DATA: Altered mental status. Chronic alcohol use daily,
including today. Unwitnessed fall today.

EXAM:
CHEST - 2 VIEW

[chest lat]
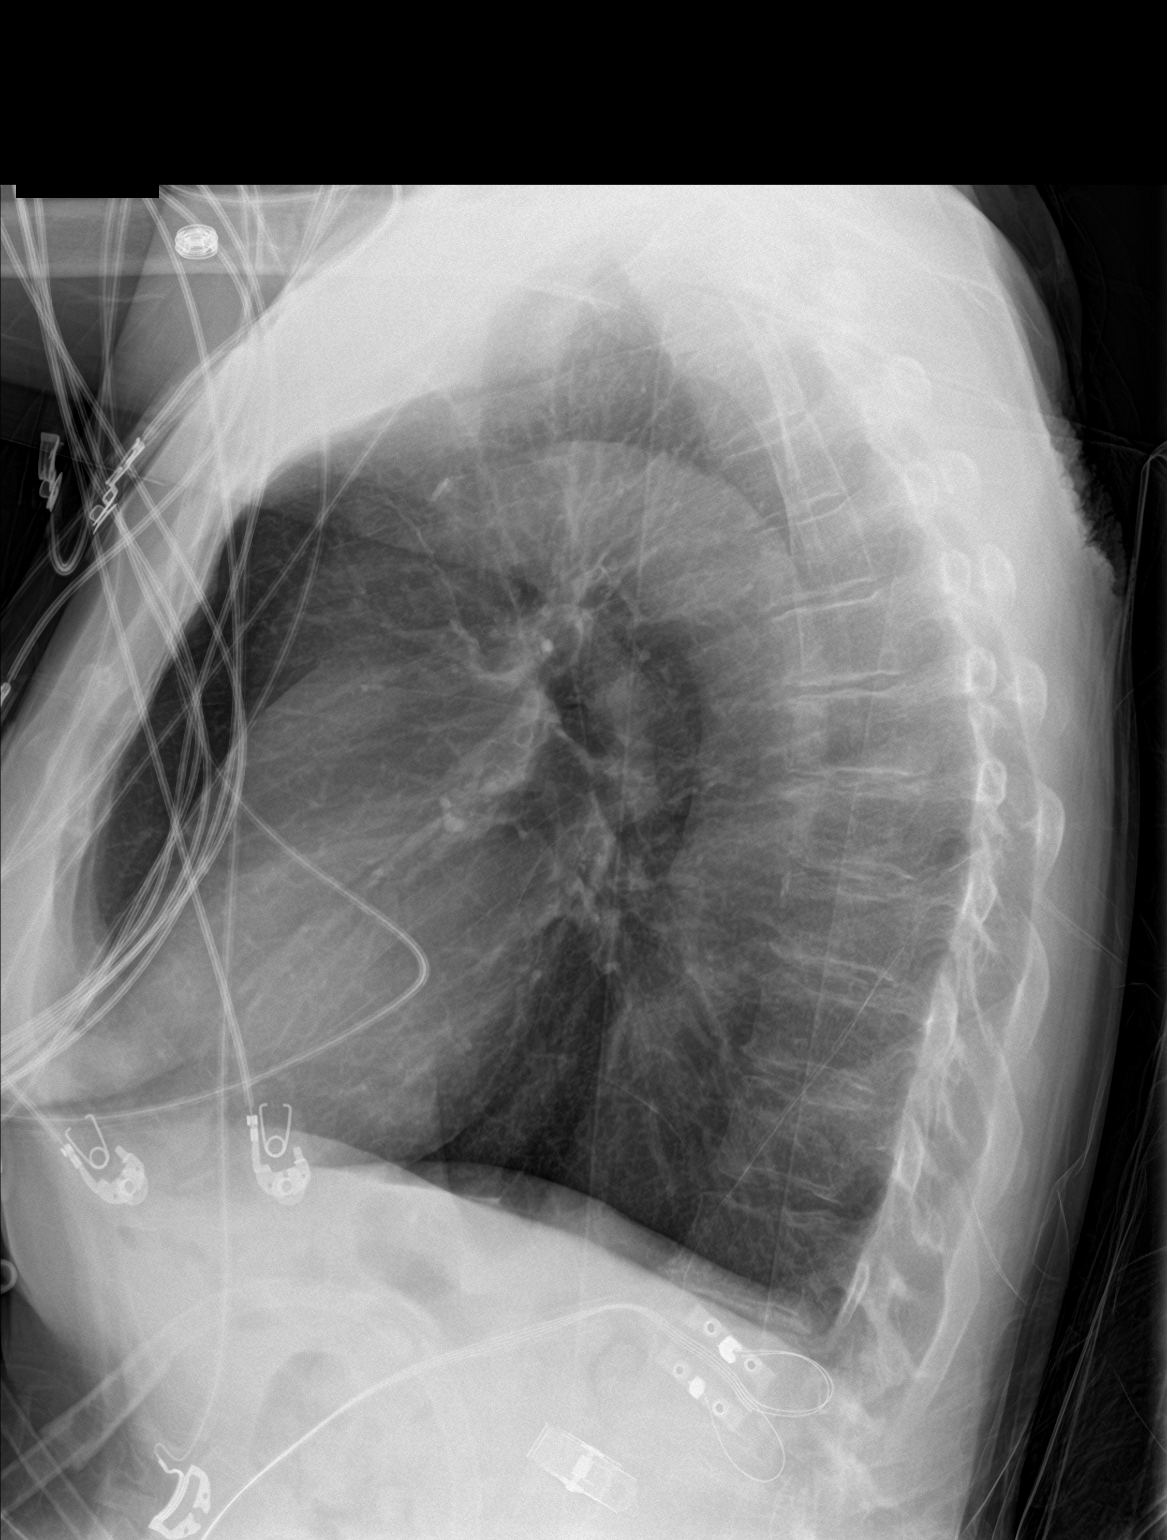

[chest ap]
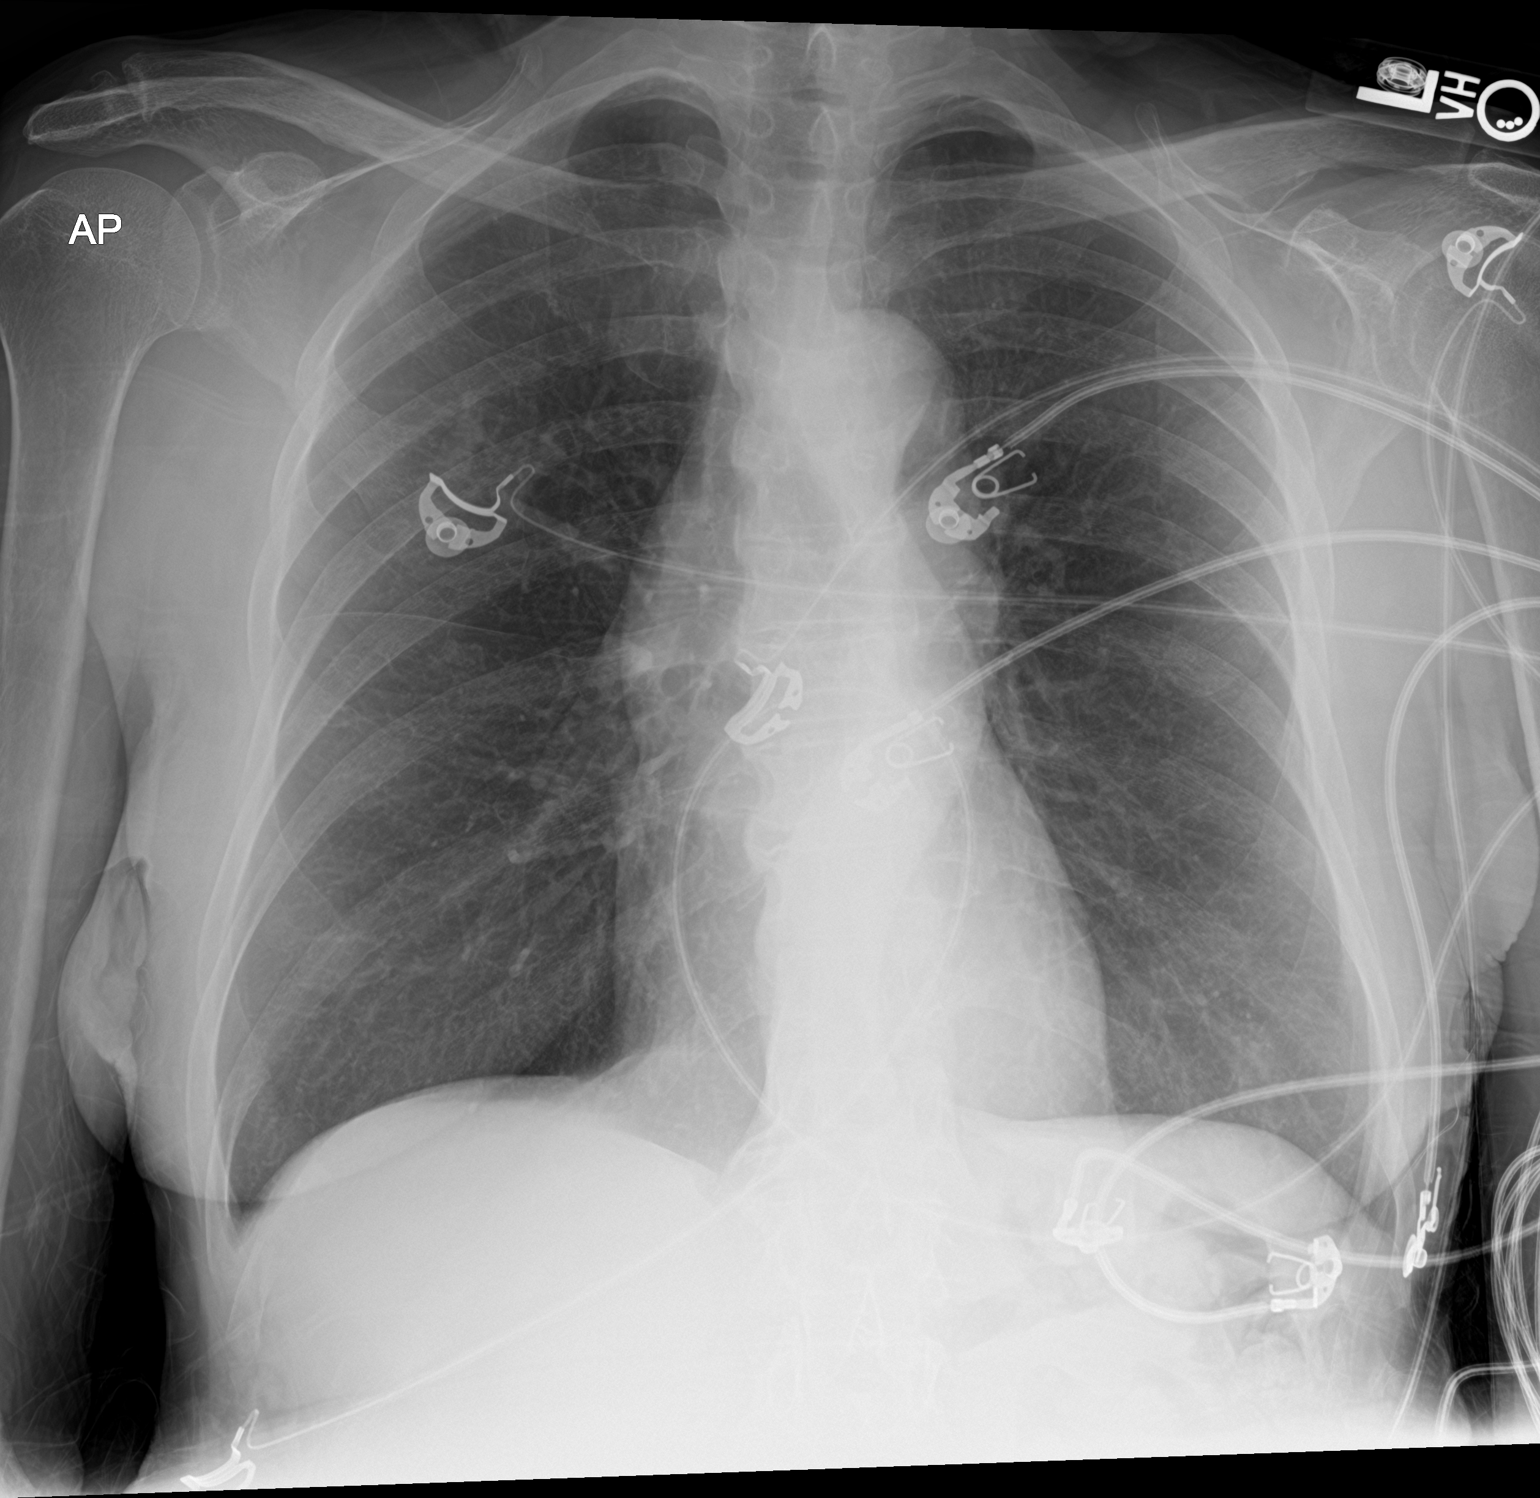

[2 of 2 positions shown; findings below may reference images not displayed]

FINDINGS: Normal sized heart. Tortuous and mildly calcified thoracic aorta.
Clear lungs. The lungs remain hyperexpanded with mild peribronchial
thickening. Thoracic spine degenerative changes.
IMPRESSION: No acute abnormality. Stable changes of COPD and chronic bronchitis.

## 2019-03-08 IMAGING — CT CT CERVICAL SPINE W/O CM
3 series · 14 of 33 positions shown, 17 images · non-contrast
Comparison: Cervical spine radiographs [DATE]

CLINICAL DATA: Altered mental status, fall

EXAM:
CT CERVICAL SPINE WITHOUT CONTRAST
TECHNIQUE: Multidetector CT imaging of the cervical spine was performed without
intravenous contrast. Multiplanar CT image reconstructions were also
generated.

[Series 4: c_spine 2.0 3 bone · axial · 0.26mm/px · z∈[-260,-128]mm · 6 of 87 slices shown, 8 images]
[im 14/87  soft-tissue]
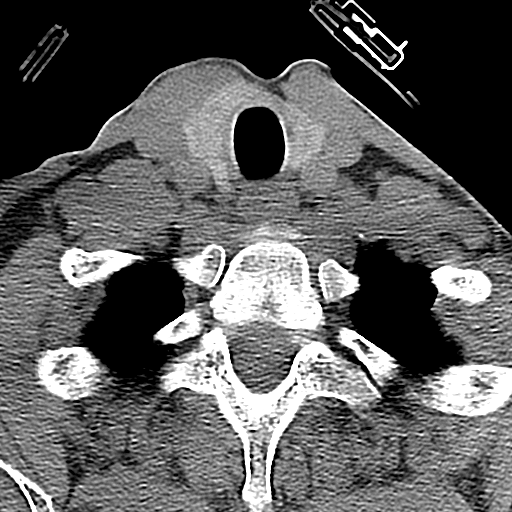
[im 14/87  bone]
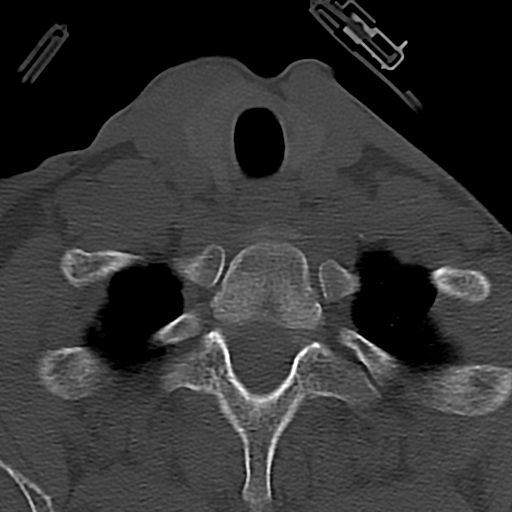
[im 27/87  bone]
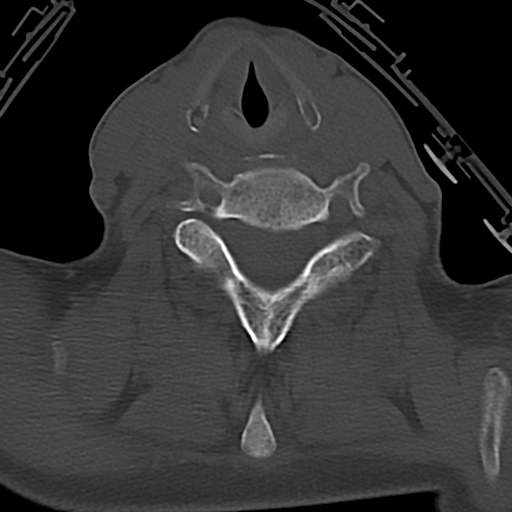
[im 40/87  bone]
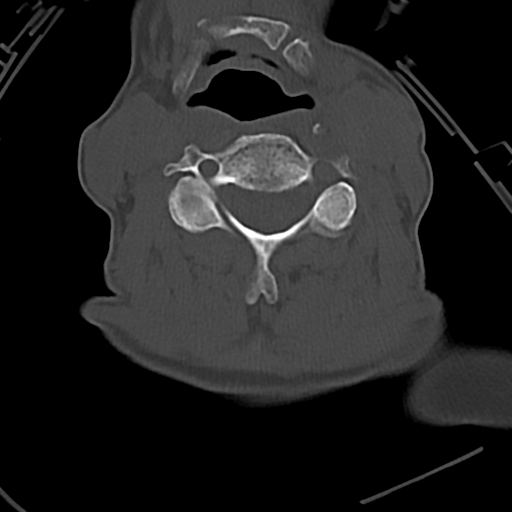
[im 53/87  bone]
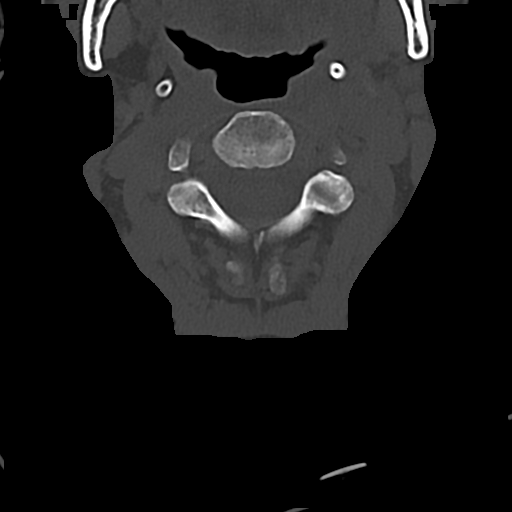
[im 67/87  soft-tissue]
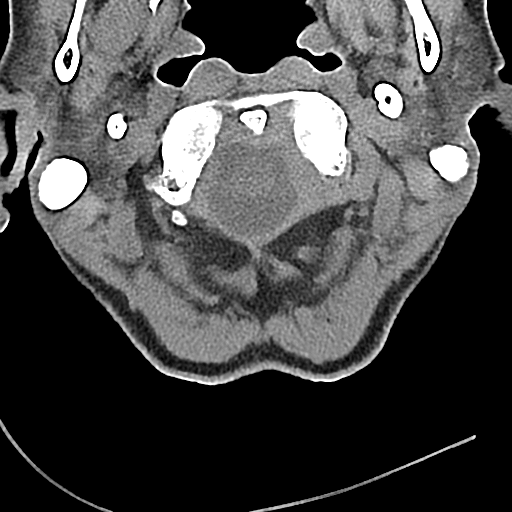
[im 67/87  bone]
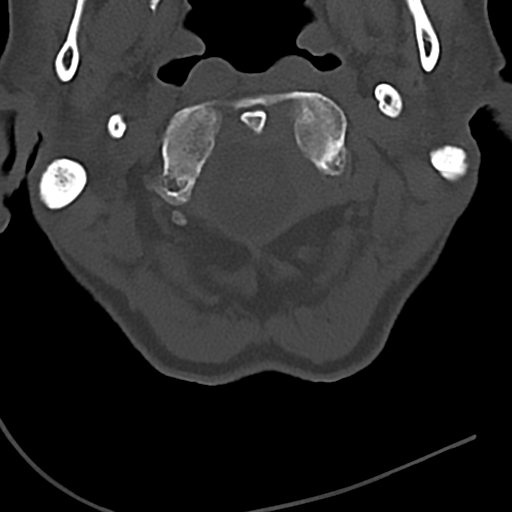
[im 80/87  bone]
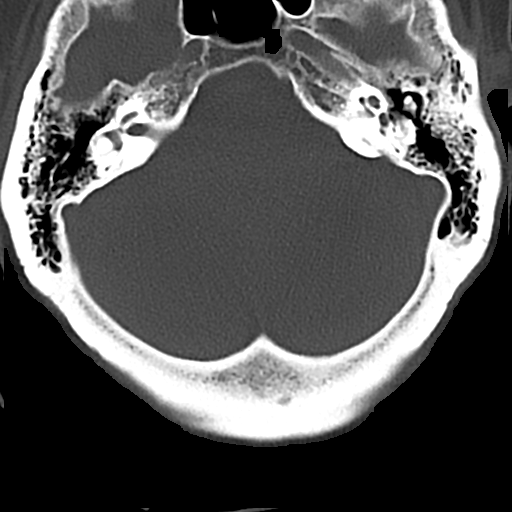

[Series 6: c_spine 2.0 sag bone · sagittal · 0.30mm/px · 5 of 46 slices shown, 6 images]
[im 16/46  bone]
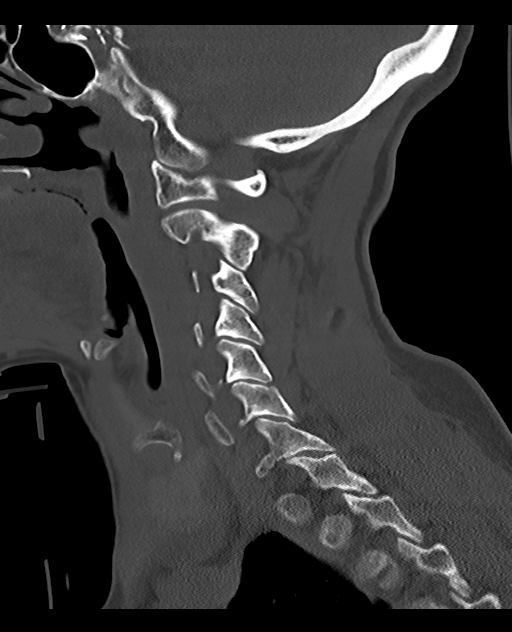
[im 19/46  bone]
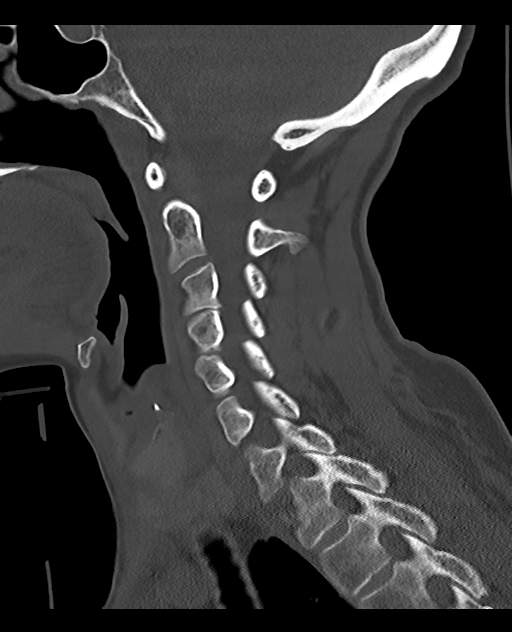
[im 23/46  soft-tissue]
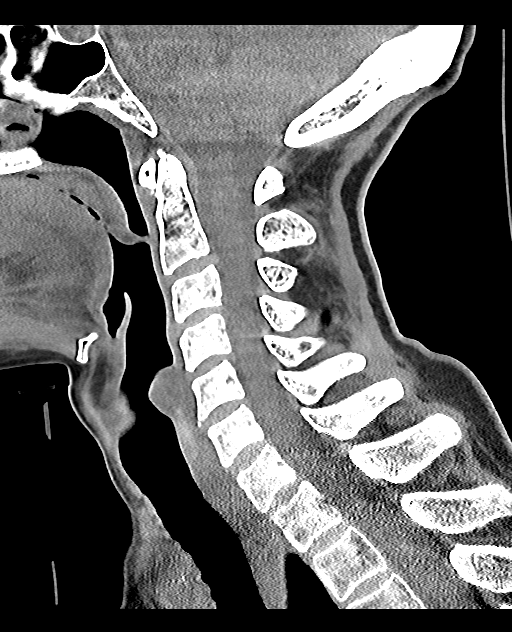
[im 23/46  bone]
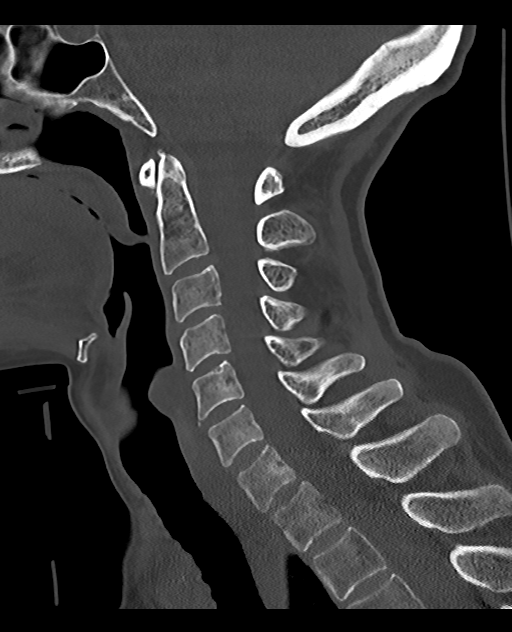
[im 27/46  bone]
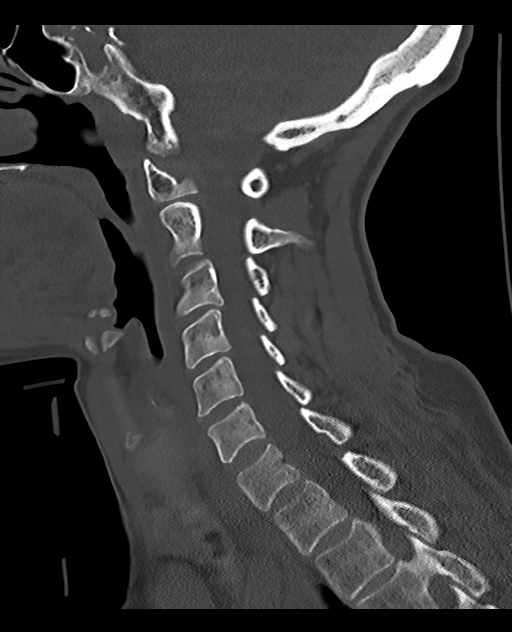
[im 31/46  bone]
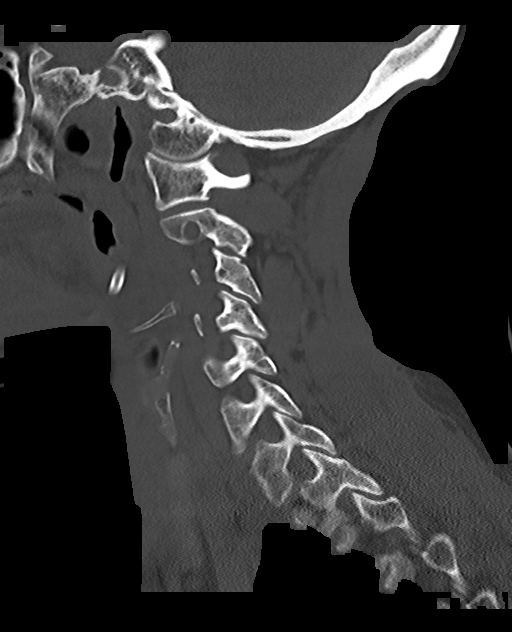

[Series 7: c_spine 2.0 cor bone · coronal · 0.29mm/px · 3 of 52 slices shown]
[im 11/52  bone]
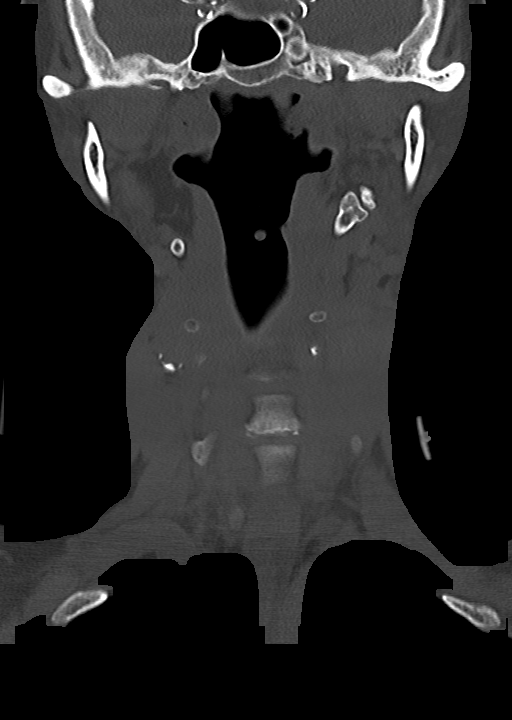
[im 21/52  bone]
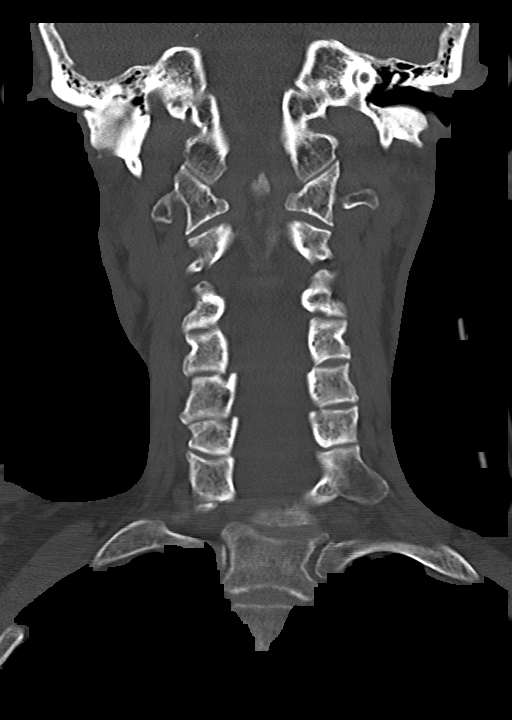
[im 31/52  bone]
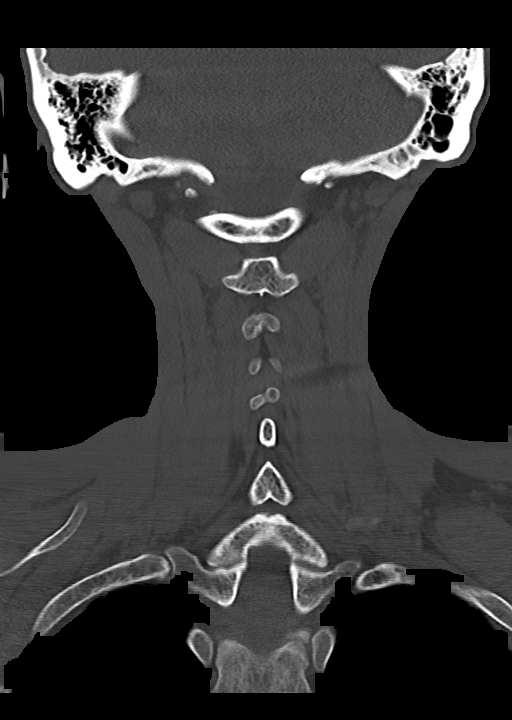

[14 of 33 positions shown; findings below may reference images not displayed]

FINDINGS: Alignment: Minimal retrolisthesis of C3 on C4 is on a likely
degenerative basis with minimal spondylitic changes at this level,
similar to comparison radiographs in [8C]. Preservation of the
normal cervical lordosis without traumatic listhesis. No abnormally
widened, perched or jumped facets. Normal alignment of the
craniocervical and atlantoaxial articulations.

Skull base and vertebrae: No acute fracture. No primary bone lesion
or focal pathologic process.

Soft tissues and spinal canal: No pre or paravertebral fluid or
swelling. No visible canal hematoma.

Disc levels: Minimal intervertebral disc height loss with mild
spondylitic endplate changes most pronounced at C3-4 and C5-6. No
significant spinal canal or foraminal stenosis.

Upper chest: No acute abnormality in the upper chest or imaged lung
apices.

Other: Carotid artery calcifications. Normal thyroid.
IMPRESSION: 1. No acute fracture or traumatic listhesis.
2. Carotid artery calcifications.
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
## 2019-03-08 NOTE — ED Provider Notes (Signed)
  Face-to-face evaluation   History: Here for evaluation of fall versus syncope and possible medication use which she does not typically take.  Patient is unable to give much history.  Physical exam: Patient is sleeping, arouses easily and seems startled when I woke her up.  Heart regular rate and rhythm without murmur.  Lungs clear anteriorly.  Chest wall is nontender to palpation.  Abdomen nontender.  She moves arms and legs equally.  Medical screening examination/treatment/procedure(s) were conducted as a shared visit with non-physician practitioner(s) and myself.  I personally evaluated the patient during the encounter    Mancel Bale, MD 03/09/19 2022

## 2019-03-08 NOTE — ED Provider Notes (Signed)
[facility] EMERGENCY DEPARTMENT Provider Note   CSN: [id number] Arrival date & time: 03/08/19  1506     History Chief Complaint  Patient presents with  . Altered Mental Status    Tracy Barton is a 74 y.o. female presenting for evaluation of possible fall and altered mental status.  Level 5 caveat due to altered mental status.  Patient states she is not quite sure why she is here.  She feels good and wants to go home.  When prompted, patient states she may have fallen.  She denies pain.  She denies recent fevers, chills, chest pain, nausea, vomiting, abdominal pain, urinary symptoms.  She reports alcohol use today, states she drinks every day.  She has had between 1-2 beers today.   Additional history came for chart review.  Patient with a history of depression, hep C, gastritis.  HPI     Past Medical History:  Diagnosis Date  . Blood transfusion without reported diagnosis   . Depression   . Neuromuscular disorder [facility])     Patient Active Problem List   Diagnosis Date Noted  . Hep C w/o coma, chronic (HCC) 09/12/2014  . Trigeminal neuralgia 09/11/2014  . Head injury 09/11/2014  . Back injury 04/15/2014  . Smoker 03/24/2013  . Unspecified gastritis and gastroduodenitis without mention of hemorrhage 03/24/2013  . Loss of weight 03/24/2013  . Unspecified vitamin D deficiency 03/24/2013    Past Surgical History:  Procedure Laterality Date  . BRAIN SURGERY    . TUBAL LIGATION       OB History   No obstetric history on file.     Family History  Problem Relation Age of Onset  . Stroke Mother   . Cancer Mother   . Cancer Brother   . Mental illness Maternal Grandmother     Social History   Tobacco Use  . Smoking status: Current Some Day Smoker    Packs/day: 0.50    Years: 53.00    Pack years: 26.50  Substance Use Topics  . Alcohol use: Yes    Alcohol/week: 2.0 standard drinks    Types: 2 Cans of beer per week  . Drug use: No     Home Medications Prior to Admission medications   Medication Sig Start Date End Date Taking? Authorizing Provider  acetaminophen (TYLENOL) 500 MG tablet Take 500 mg by mouth every 8 (eight) hours as needed for moderate pain.    [provider]  Calcium-Vit D-Arg-Inos-Silicon (BONE DENSITY) 300-200 MG-UNIT TABS Take 1 tablet by mouth 2 (two) times daily. Patient not taking: Reported on 04/15/2014 12/02/12   Carmelina Dane, MD  hydrOXYzine (ATARAX/VISTARIL) 25 MG tablet Take 1 tablet (25 mg total) by mouth every 6 (six) hours. 12/07/15   Deatra Canter, FNP  lansoprazole (PREVACID) 30 MG capsule Take 1 capsule (30 mg total) by mouth daily at 12 noon. Patient not taking: Reported on 04/15/2014 03/24/13   Collene Gobble, MD  lansoprazole (PREVACID) 30 MG capsule Take 1 capsule (30 mg total) by mouth daily at 12 noon. 06/22/14   Carmelina Dane, MD  meloxicam (MOBIC) 15 MG tablet Take 15 mg by mouth as needed for pain.    [provider]  meloxicam (MOBIC) 15 MG tablet Take 1 tablet (15 mg total) by mouth daily. 06/22/14   Carmelina Dane, MD  methylPREDNISolone (MEDROL DOSEPAK) 4 MG TBPK tablet Take 6-5-4-3-2-1 po qd 12/07/15   Deatra Canter, FNP  oxyCODONE-acetaminophen (PERCOCET)  5-325 MG per tablet Take 1-2 tablets by mouth every 6 (six) hours as needed for severe pain. Patient not taking: Reported on 04/15/2014 05/12/13   Piepenbrink, Victorino Dike, PA-C  sucralfate (CARAFATE) 1 G tablet Take 1 tablet (1 g total) by mouth 4 (four) times daily -  with meals and at bedtime. Patient not taking: Reported on 04/15/2014 03/24/13   Collene Gobble, MD  tiZANidine (ZANAFLEX) 4 MG tablet Take 4 mg by mouth every 6 (six) hours as needed for muscle spasms.    [provider]  zoster vaccine live, PF, (ZOSTAVAX) 76720 UNT/0.65ML injection Inject 19,400 Units into the skin once. Patient not taking: Reported on 04/15/2014 03/24/13   Collene Gobble, MD    Allergies    Patient  has no known allergies.  Review of Systems   Review of Systems  Unable to perform ROS: Mental status change    Physical Exam Updated Vital Signs BP (!) 153/85   Pulse 73   Temp 98 F (36.7 C) (Oral)   Resp 18   SpO2 98%   Physical Exam Vitals and nursing note reviewed.  Constitutional:      General: She is not in acute distress.    Appearance: She is well-developed.     Comments: Resting comfortably the bed in no acute distress  HENT:     Head: Normocephalic and atraumatic.     Comments: No signs of head trauma Eyes:     Extraocular Movements: Extraocular movements intact.     Conjunctiva/sclera: Conjunctivae normal.     Pupils: Pupils are equal, round, and reactive to light.  Neck:     Comments: In c collar, no ttp of midline Cardiovascular:     Rate and Rhythm: Normal rate and regular rhythm.     Pulses: Normal pulses.  Pulmonary:     Effort: Pulmonary effort is normal. No respiratory distress.     Breath sounds: Normal breath sounds. No wheezing.  Abdominal:     General: There is no distension.     Palpations: Abdomen is soft. There is no mass.     Tenderness: There is no abdominal tenderness. There is no guarding or rebound.  Musculoskeletal:        General: Normal range of motion.  Skin:    General: Skin is warm and dry.     Capillary Refill: Capillary refill takes less than 2 seconds.  Neurological:     Mental Status: She is alert.     GCS: GCS eye subscore is 4. GCS verbal subscore is 4. GCS motor subscore is 6.     Comments: Oriented to person place, not time.     ED Results / Procedures / Treatments   Labs (all labs ordered are listed, but only abnormal results are displayed) Labs Reviewed  CBC WITH DIFFERENTIAL/PLATELET - Abnormal; Notable for the following components:      Result Value   RBC 5.25 (*)    Hemoglobin 15.2 (*)    All other components within normal limits  COMPREHENSIVE METABOLIC PANEL - Abnormal; Notable for the following  components:   Creatinine, Ser 1.16 (*)    AST 45 (*)    GFR calc non Af Amer 46 (*)    GFR calc Af Amer 54 (*)    All other components within normal limits  URINALYSIS, ROUTINE W REFLEX MICROSCOPIC - Abnormal; Notable for the following components:   Color, Urine STRAW (*)    Specific Gravity, Urine 1.003 (*)  Hgb urine dipstick SMALL (*)    Leukocytes,Ua TRACE (*)    Bacteria, UA RARE (*)    All other components within normal limits  RAPID URINE DRUG SCREEN, HOSP PERFORMED - Abnormal; Notable for the following components:   Cocaine POSITIVE (*)    All other components within normal limits  ETHANOL - Abnormal; Notable for the following components:   Alcohol, Ethyl (B) 112 (*)    All other components within normal limits  SALICYLATE LEVEL - Abnormal; Notable for the following components:   Salicylate Lvl <7.5 (*)    All other components within normal limits  ACETAMINOPHEN LEVEL - Abnormal; Notable for the following components:   Acetaminophen (Tylenol), Serum <10 (*)    All other components within normal limits  URINE CULTURE  LIPASE, BLOOD    EKG EKG Interpretation  Date/Time:  Saturday March 08 2019 15:42:09 EST Ventricular Rate:  75 PR Interval:    QRS Duration: 85 QT Interval:  454 QTC Calculation: 508 R Axis:   39 Text Interpretation: Sinus rhythm Supraventricular bigeminy Probable anterior infarct, age indeterminate Prolonged QT interval Since last tracing QT has lengthened Otherwise no significant change Confirmed by Daleen Bo [phone]) on 03/08/2019 4:01:47 PM   Radiology DG Chest 2 View  Result Date: 03/08/2019 CLINICAL DATA:  Altered mental status. Chronic alcohol use daily, including today. Unwitnessed fall today. EXAM: CHEST - 2 VIEW COMPARISON:  04/15/2014 FINDINGS: Normal sized heart. Tortuous and mildly calcified thoracic aorta. Clear lungs. The lungs remain hyperexpanded with mild peribronchial thickening. Thoracic spine degenerative changes. IMPRESSION:  No acute abnormality. Stable changes of COPD and chronic bronchitis. Electronically Signed   By: Claudie Revering M.D.   On: 03/08/2019 16:37   CT Head Wo Contrast  Result Date: 03/08/2019 CLINICAL DATA:  Head trauma EXAM: CT HEAD WITHOUT CONTRAST TECHNIQUE: Contiguous axial images were obtained from the base of the skull through the vertex without intravenous contrast. COMPARISON:  CT 09/13/2006 FINDINGS: Brain: Encephalomalacia in the left occipital lobe compatible with remote left PCA distribution infarct. No evidence of acute infarction, hemorrhage, hydrocephalus, extra-axial collection or mass lesion/mass effect. Symmetric prominence of the ventricles, cisterns and sulci compatible with parenchymal volume loss. Patchy areas of white matter hypoattenuation are most compatible with chronic microvascular angiopathy. Vascular: Atherosclerotic calcification of the carotid siphons. No hyperdense vessel. Skull: No calvarial fracture or suspicious osseous lesion. No scalp swelling or hematoma. Sinuses/Orbits: Paranasal sinuses and mastoid air cells are predominantly clear. Included orbital structures are unremarkable. 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 No acute intracranial abnormality. Chronic microvascular angiopathy and parenchymal volume loss. Electronically Signed   By: Lovena Le M.D.   On: 03/08/2019 16:22   CT Cervical Spine Wo Contrast  Result Date: 03/08/2019 CLINICAL DATA:  Altered mental status, fall EXAM: CT CERVICAL SPINE WITHOUT CONTRAST TECHNIQUE: Multidetector CT imaging of the cervical spine was performed without intravenous contrast. Multiplanar CT image reconstructions were also generated. COMPARISON:  Cervical spine radiographs 09/13/2006 FINDINGS: Alignment: Minimal retrolisthesis of C3 on C4 is on a likely degenerative basis with minimal spondylitic changes at this level, similar to comparison radiographs in 2008. Preservation of the normal cervical lordosis  without traumatic listhesis. No abnormally widened, perched or jumped facets. Normal alignment of the craniocervical and atlantoaxial articulations. Skull base and vertebrae: No acute fracture. No primary bone lesion or focal pathologic process. Soft tissues and spinal canal: No pre or paravertebral fluid or swelling. No visible canal hematoma. Disc levels: Minimal intervertebral disc height  loss with mild spondylitic endplate changes most pronounced at C3-4 and C5-6. No significant spinal canal or foraminal stenosis. Upper chest: No acute abnormality in the upper chest or imaged lung apices. Other: Carotid artery calcifications. Normal thyroid. IMPRESSION: 1. No acute fracture or traumatic listhesis. 2. Carotid artery calcifications. Electronically Signed   By: Kreg Shropshire M.D.   On: 03/08/2019 16:27    Procedures Procedures (including critical care time)  Medications Ordered in ED Medications - No data to display  ED Course  I have reviewed the triage vital signs and the nursing notes.  Pertinent labs & imaging results that were available during my care of the patient were reviewed by me and considered in my medical decision making (see chart for details).    MDM Rules/Calculators/A&P                      Patient presenting for evaluation of possible fall and altered mental status.  On exam, patient is alert to person and place, but not time.  She is unable to tell me exactly what happened.  She does report alcohol use.  As such, consider this as cause of confusion.  Also consider drug use.  Will obtain labs, CT head and neck, chest x-ray, and EKG for further evaluation.  CT head and neck negative for acute findings such as bleeding or fracture.  Chest x-ray ventriculotomy, no pneumonia, infiltrate, effusion, fracture, or sign of pulmonary injury.  Labs interpreted by me, overall reassuring.  Hemoglobin stable.  Electrolytes stable.  Ethanol mildly elevated at 112, consistent with history.   UDS positive for cocaine, likely contributing to patient symptoms.  Urine negative for infection.  Case discussed with attending, Dr. Effie Shy evaluated the patient.  On reassessment, patient is much more alert and oriented.  She is back to baseline mental status.  Patient ambulated with some mild difficulty (off balance), states this is baseline for her.  Patient is requesting to leave, states she feels fine.  At this time, no clear emergent or life-threatening conditions exist, and patient's AMS and possible fall are likely due to her alcohol and drug use.  Discussed with patient.  Discussed importance of follow-up with primary care.  At this time, patient appears safe for discharge.  Return precautions given.  Patient states she understands and agrees to plan.  Final Clinical Impression(s) / ED Diagnoses Final diagnoses:  Transient alteration of awareness  Alcohol use  Cocaine use    Rx / DC Orders ED Discharge Orders    None       Alveria Apley, PA-C 03/08/19 1951    Mancel Bale, MD 03/09/19 2022

## 2019-03-08 NOTE — Discharge Instructions (Addendum)
I recommend that you decrease the amount of alcohol you are drinking and stop using drugs, as these are putting you at higher risk for falls. Make sure you stay well-hydrated with water. Follow-up with a primary care doctor for further evaluation of your symptoms. Return to emergency room with any new, worsening, or concerning symptoms.

## 2019-03-08 NOTE — ED Notes (Signed)
Pt assisted with getting dressed. Pt ambulated in the room without difficulty. Pt reports that she is always off balance and states that this isn't new for her.

## 2019-03-08 NOTE — ED Triage Notes (Signed)
EMS further reports that a downstairs neighbor reports to EMS that the pt told her this am that she took an unknown pill. Family and neighbor could not give EMS any medical hx or medication information. Pt family reported that they do not think the pt takes any medications or has any medical hx.

## 2019-03-08 NOTE — ED Triage Notes (Signed)
Pt presents to the ED by EMS for AMS. Ems reports that the pt lives at home by herself. EMS reports that someone at the home reports an unwitnessed fall at home with possible shakiness prior to the fall. Pt denies pain and does not remember the fall. Pt is AMS since then. Altered to self and place, not to time. Chronic ETOH use daily including today. LKW 1345 today. 20 L forearm.

## 2019-03-08 NOTE — ED Notes (Signed)
Pt discharge and follow up education provided. Pt verbalizes understanding. Pt is alert and oriented x 4 at discharge.  

## 2019-03-09 ENCOUNTER — Encounter (HOSPITAL_COMMUNITY): Payer: Self-pay

## 2019-03-09 ENCOUNTER — Emergency Department (HOSPITAL_COMMUNITY)
Admission: EM | Admit: 2019-03-09 | Discharge: 2019-03-09 | Disposition: A | Discharge status: Home / Self Care | Payer: Medicaid Other

## 2019-03-09 NOTE — ED Notes (Signed)
Dickey Gave [phone] Marylene Land (Patient's sister)- [phone]

## 2019-03-09 NOTE — ED Notes (Signed)
Sister Loretta [phone] called and stated her daughter Tracy Barton [phone] is coming to get her 1st in the morning--advised she went to work because she thought they were keeping her--Leslie  

## 2019-03-09 NOTE — ED Triage Notes (Signed)
Patient arrived to room from the waiting area where it is reported by staff that patient has been waiting since 1900 03/08/19 for family; Staff reports family has been contacted several times and has promised to pick patient up but no one has yet to show up; reported last attempted made to contact the family the person who answered the same phone number called previous times disguised voice and states it is the wrong #. Patient was able to ambulate to bed but had a slightly off gait but per previous notes this is her normal; pt given warm blanket and offered fluids; pt is made comfortable in hospital bed. Social work to be consulted for called to APS for abandonment-Monique,RN

## 2019-03-09 NOTE — ED Notes (Signed)
RN was given information by Secretary that the patient's sister Marylene Land called from out of state inquiring if patient was still sitting in waiting room; Mrs.Marylene Land was concerned that family had not come to pick patient up and contacted one of patient's daughters Dickey Gave who called concerned that mother had not been picked up; Octaviano Glow states that her sister was on scene with patient when EMS transported to patient to ED and was told by EMS that the patient would most likely get admitted for dehydration; Octaviano Glow was told by her sister that that the patient would be getting admitted so Octaviano Glow went to work; Octaviano Glow is currently at work was given false information but is unable to leave work at this time to pick patient up; The other daughter is unable to pick patient up due to have been drinking and not safe to get on the road. Octaviano Glow will be here to pick patient up at 07:40am on 03/09/2019-Monique,RN

## 2019-03-09 NOTE — ED Notes (Signed)
[name] [phone] called and stated her daughter Tracy Barton [phone] is coming to get her 1st in the morning--advised she went to work because she thought they were keeping her--Leslie

## 2019-03-10 LAB — URINE CULTURE: Culture: >=100000 — AB

## 2019-03-11 ENCOUNTER — Telehealth: Payer: Self-pay

## 2019-03-11 NOTE — Telephone Encounter (Signed)
No treatment for UA ED 03/08/19 per Evelena Leyden PA

## 2020-01-10 ENCOUNTER — Emergency Department (HOSPITAL_COMMUNITY): Payer: Medicare Other

## 2020-01-10 ENCOUNTER — Encounter (HOSPITAL_COMMUNITY): Payer: Self-pay | Admitting: Emergency Medicine

## 2020-01-10 ENCOUNTER — Encounter (HOSPITAL_COMMUNITY): Admission: EM | Disposition: A | Discharge status: Home Health | Payer: Self-pay | Source: Home / Self Care

## 2020-01-10 ENCOUNTER — Emergency Department (HOSPITAL_COMMUNITY): Payer: Medicare Other | Admitting: Anesthesiology

## 2020-01-10 ENCOUNTER — Other Ambulatory Visit: Payer: Self-pay

## 2020-01-10 ENCOUNTER — Inpatient Hospital Stay (HOSPITAL_COMMUNITY)
Admission: EM | Admit: 2020-01-10 | Discharge: 2020-01-23 | DRG: 326 | Disposition: A | Discharge status: Home Health | Payer: Medicare Other | Attending: Internal Medicine | Admitting: Internal Medicine

## 2020-01-10 DIAGNOSIS — F17200 Nicotine dependence, unspecified, uncomplicated: Secondary | ICD-10-CM | POA: Diagnosis present

## 2020-01-10 DIAGNOSIS — I160 Hypertensive urgency: Secondary | ICD-10-CM | POA: Diagnosis not present

## 2020-01-10 DIAGNOSIS — E876 Hypokalemia: Secondary | ICD-10-CM | POA: Diagnosis not present

## 2020-01-10 DIAGNOSIS — B182 Chronic viral hepatitis C: Secondary | ICD-10-CM | POA: Diagnosis present

## 2020-01-10 DIAGNOSIS — Z7952 Long term (current) use of systemic steroids: Secondary | ICD-10-CM | POA: Diagnosis not present

## 2020-01-10 DIAGNOSIS — Z681 Body mass index (BMI) 19 or less, adult: Secondary | ICD-10-CM

## 2020-01-10 DIAGNOSIS — G9341 Metabolic encephalopathy: Secondary | ICD-10-CM | POA: Diagnosis present

## 2020-01-10 DIAGNOSIS — F1010 Alcohol abuse, uncomplicated: Secondary | ICD-10-CM | POA: Diagnosis not present

## 2020-01-10 DIAGNOSIS — Z823 Family history of stroke: Secondary | ICD-10-CM

## 2020-01-10 DIAGNOSIS — Z20822 Contact with and (suspected) exposure to covid-19: Secondary | ICD-10-CM | POA: Diagnosis not present

## 2020-01-10 DIAGNOSIS — Z4659 Encounter for fitting and adjustment of other gastrointestinal appliance and device: Secondary | ICD-10-CM

## 2020-01-10 DIAGNOSIS — I10 Essential (primary) hypertension: Secondary | ICD-10-CM | POA: Diagnosis present

## 2020-01-10 DIAGNOSIS — K255 Chronic or unspecified gastric ulcer with perforation: Principal | ICD-10-CM | POA: Diagnosis present

## 2020-01-10 DIAGNOSIS — N179 Acute kidney failure, unspecified: Secondary | ICD-10-CM | POA: Diagnosis not present

## 2020-01-10 DIAGNOSIS — Z9114 Patient's other noncompliance with medication regimen: Secondary | ICD-10-CM

## 2020-01-10 DIAGNOSIS — E559 Vitamin D deficiency, unspecified: Secondary | ICD-10-CM | POA: Diagnosis present

## 2020-01-10 DIAGNOSIS — F17210 Nicotine dependence, cigarettes, uncomplicated: Secondary | ICD-10-CM | POA: Diagnosis present

## 2020-01-10 DIAGNOSIS — Z79899 Other long term (current) drug therapy: Secondary | ICD-10-CM

## 2020-01-10 DIAGNOSIS — D62 Acute posthemorrhagic anemia: Secondary | ICD-10-CM | POA: Diagnosis not present

## 2020-01-10 DIAGNOSIS — Z8673 Personal history of transient ischemic attack (TIA), and cerebral infarction without residual deficits: Secondary | ICD-10-CM

## 2020-01-10 DIAGNOSIS — K251 Acute gastric ulcer with perforation: Secondary | ICD-10-CM

## 2020-01-10 DIAGNOSIS — K2990 Gastroduodenitis, unspecified, without bleeding: Secondary | ICD-10-CM | POA: Diagnosis present

## 2020-01-10 DIAGNOSIS — F10930 Alcohol use, unspecified with withdrawal, uncomplicated: Secondary | ICD-10-CM | POA: Diagnosis not present

## 2020-01-10 DIAGNOSIS — Z9081 Acquired absence of spleen: Secondary | ICD-10-CM

## 2020-01-10 DIAGNOSIS — B9681 Helicobacter pylori [H. pylori] as the cause of diseases classified elsewhere: Secondary | ICD-10-CM | POA: Diagnosis present

## 2020-01-10 DIAGNOSIS — K2970 Gastritis, unspecified, without bleeding: Secondary | ICD-10-CM | POA: Diagnosis present

## 2020-01-10 DIAGNOSIS — R188 Other ascites: Secondary | ICD-10-CM | POA: Diagnosis not present

## 2020-01-10 DIAGNOSIS — F32A Depression, unspecified: Secondary | ICD-10-CM | POA: Diagnosis present

## 2020-01-10 DIAGNOSIS — Z818 Family history of other mental and behavioral disorders: Secondary | ICD-10-CM

## 2020-01-10 DIAGNOSIS — G500 Trigeminal neuralgia: Secondary | ICD-10-CM | POA: Diagnosis present

## 2020-01-10 DIAGNOSIS — D638 Anemia in other chronic diseases classified elsewhere: Secondary | ICD-10-CM | POA: Diagnosis present

## 2020-01-10 DIAGNOSIS — R109 Unspecified abdominal pain: Secondary | ICD-10-CM | POA: Diagnosis present

## 2020-01-10 DIAGNOSIS — K651 Peritoneal abscess: Secondary | ICD-10-CM | POA: Diagnosis present

## 2020-01-10 DIAGNOSIS — K668 Other specified disorders of peritoneum: Secondary | ICD-10-CM

## 2020-01-10 DIAGNOSIS — F0150 Vascular dementia without behavioral disturbance: Secondary | ICD-10-CM | POA: Diagnosis present

## 2020-01-10 DIAGNOSIS — Z781 Physical restraint status: Secondary | ICD-10-CM

## 2020-01-10 DIAGNOSIS — K279 Peptic ulcer, site unspecified, unspecified as acute or chronic, without hemorrhage or perforation: Secondary | ICD-10-CM | POA: Diagnosis not present

## 2020-01-10 DIAGNOSIS — E43 Unspecified severe protein-calorie malnutrition: Secondary | ICD-10-CM | POA: Diagnosis not present

## 2020-01-10 HISTORY — PX: LAPAROTOMY: SHX154

## 2020-01-10 HISTORY — PX: LAPAROSCOPY: SHX197

## 2020-01-10 LAB — CBC WITH DIFFERENTIAL/PLATELET
Abs Immature Granulocytes: 0.02 10*3/uL (ref 0.00–0.07)
Basophils Absolute: 0 10*3/uL (ref 0.0–0.1)
Basophils Relative: 0 %
Eosinophils Absolute: 0 10*3/uL (ref 0.0–0.5)
Eosinophils Relative: 0 %
HCT: 47.4 % — ABNORMAL HIGH (ref 36.0–46.0)
Hemoglobin: 15.3 g/dL — ABNORMAL HIGH (ref 12.0–15.0)
Immature Granulocytes: 0 %
Lymphocytes Relative: 12 %
Lymphs Abs: 0.7 10*3/uL (ref 0.7–4.0)
MCH: 30.2 pg (ref 26.0–34.0)
MCHC: 32.3 g/dL (ref 30.0–36.0)
MCV: 93.5 fL (ref 80.0–100.0)
Monocytes Absolute: 0.1 10*3/uL (ref 0.1–1.0)
Monocytes Relative: 2 %
Neutro Abs: 5.1 10*3/uL (ref 1.7–7.7)
Neutrophils Relative %: 86 %
Platelets: 286 10*3/uL (ref 150–400)
RBC: 5.07 MIL/uL (ref 3.87–5.11)
RDW: 12.7 % (ref 11.5–15.5)
WBC: 6 10*3/uL (ref 4.0–10.5)
nRBC: 0 % (ref 0.0–0.2)

## 2020-01-10 LAB — ABO/RH: ABO/RH(D): B POS

## 2020-01-10 LAB — TYPE AND SCREEN
ABO/RH(D): B POS
Antibody Screen: NEGATIVE

## 2020-01-10 LAB — COMPREHENSIVE METABOLIC PANEL
ALT: 8 U/L (ref 0–44)
AST: 29 U/L (ref 15–41)
Albumin: 3.9 g/dL (ref 3.5–5.0)
Alkaline Phosphatase: 74 U/L (ref 38–126)
Anion gap: 23 — ABNORMAL HIGH (ref 5–15)
BUN: 29 mg/dL — ABNORMAL HIGH (ref 8–23)
CO2: 18 mmol/L — ABNORMAL LOW (ref 22–32)
Calcium: 9.4 mg/dL (ref 8.9–10.3)
Chloride: 99 mmol/L (ref 98–111)
Creatinine, Ser: 1.74 mg/dL — ABNORMAL HIGH (ref 0.44–1.00)
GFR, Estimated: 30 mL/min — ABNORMAL LOW (ref >60)
Glucose, Bld: 62 mg/dL — ABNORMAL LOW (ref 70–99)
Potassium: 4.8 mmol/L (ref 3.5–5.1)
Sodium: 140 mmol/L (ref 135–145)
Total Bilirubin: 1 mg/dL (ref 0.3–1.2)
Total Protein: 6.9 g/dL (ref 6.5–8.1)

## 2020-01-10 LAB — ETHANOL: Alcohol, Ethyl (B): <10 mg/dL (ref <10)

## 2020-01-10 LAB — URINALYSIS, ROUTINE W REFLEX MICROSCOPIC
Bilirubin Urine: NEGATIVE
Glucose, UA: NEGATIVE mg/dL
Ketones, ur: 20 mg/dL — AB
Nitrite: NEGATIVE
Protein, ur: 30 mg/dL — AB
Specific Gravity, Urine: 1.024 (ref 1.005–1.030)
WBC, UA: >50 WBC/hpf — ABNORMAL HIGH (ref 0–5)
pH: 5 (ref 5.0–8.0)

## 2020-01-10 LAB — RESP PANEL BY RT-PCR (FLU A&B, COVID) ARPGX2
Influenza A by PCR: NEGATIVE
Influenza B by PCR: NEGATIVE
SARS Coronavirus 2 by RT PCR: NEGATIVE

## 2020-01-10 LAB — LACTIC ACID, PLASMA: Lactic Acid, Venous: 3.1 mmol/L (ref 0.5–1.9)

## 2020-01-10 LAB — LIPASE, BLOOD: Lipase: 32 U/L (ref 11–51)

## 2020-01-10 IMAGING — CT CT ABD-PELV W/O CM
2 of 6 series · 14 of 46 positions shown, 16 images · non-contrast
Comparison: None.

CLINICAL DATA: Abdominal pain.

EXAM:
CT ABDOMEN AND PELVIS WITHOUT CONTRAST
TECHNIQUE: Multidetector CT imaging of the abdomen and pelvis was performed
following the standard protocol without IV contrast.

[Series 2: axial st · axial · 0.71mm/px · z∈[-637,-312]mm · 11 of 79 slices shown, 13 images]
[im 7/79  soft-tissue]
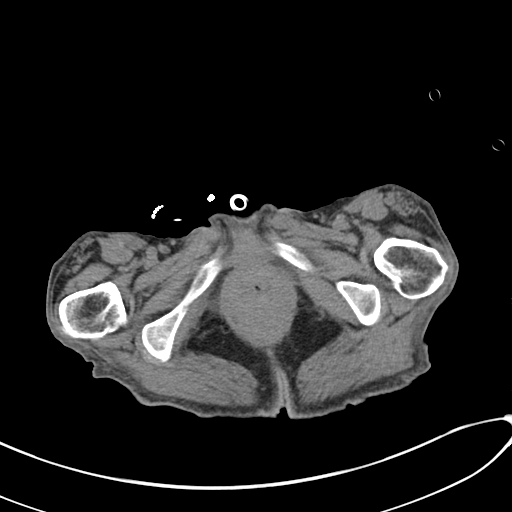
[im 7/79  bone]
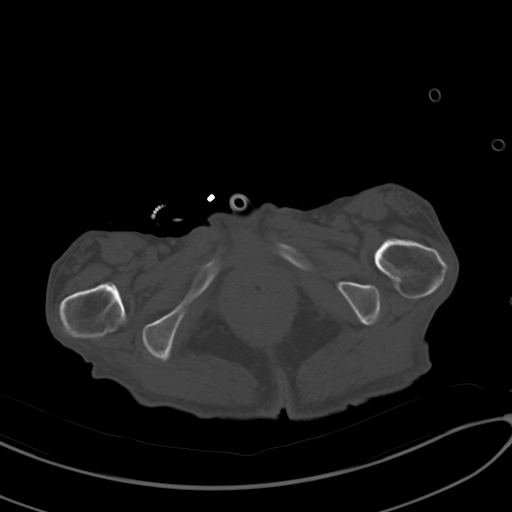
[im 14/79  soft-tissue]
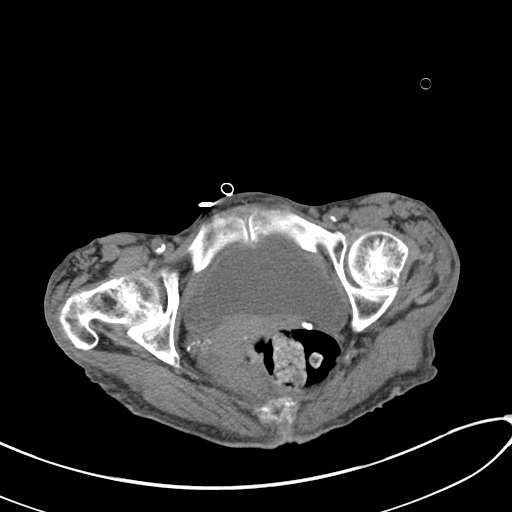
[im 20/79  soft-tissue]
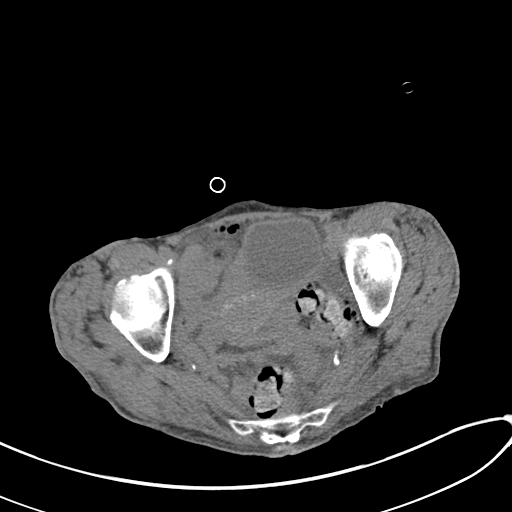
[im 27/79  soft-tissue]
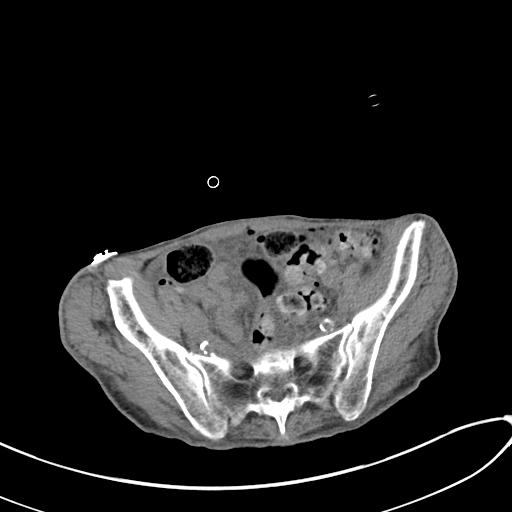
[im 33/79  soft-tissue]
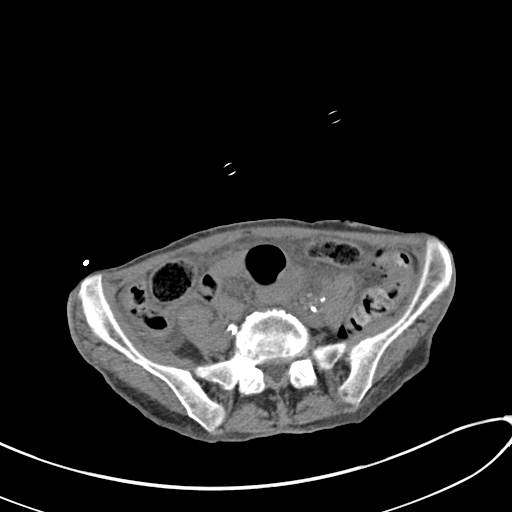
[im 40/79  soft-tissue]
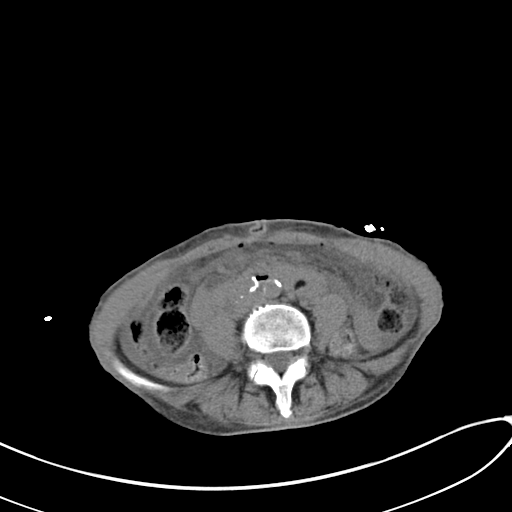
[im 46/79  soft-tissue]
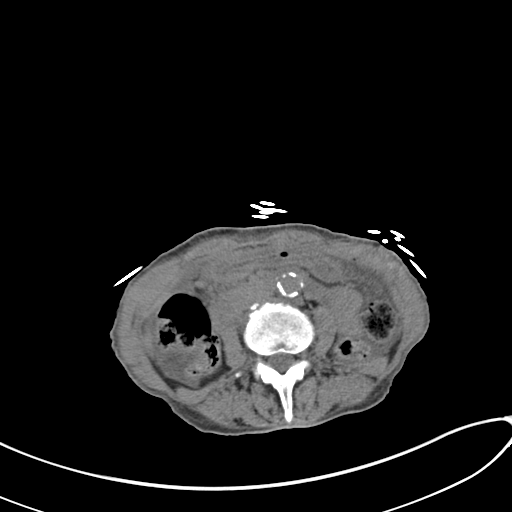
[im 53/79  soft-tissue]
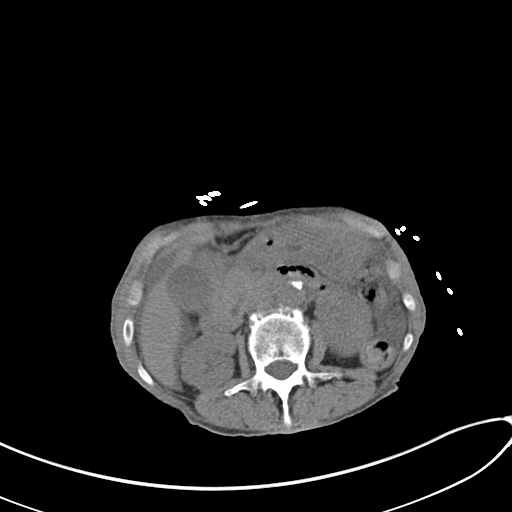
[im 59/79  soft-tissue]
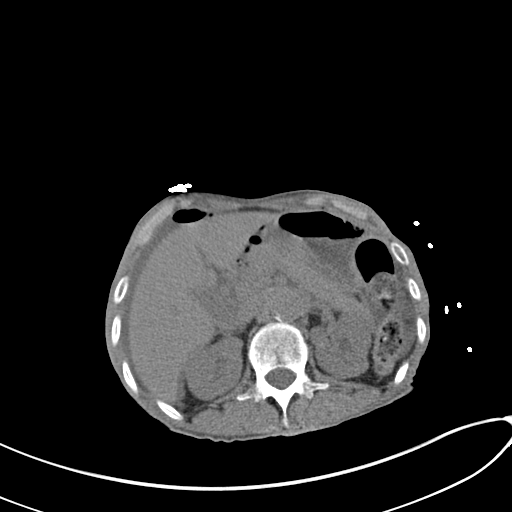
[im 59/79  bone]
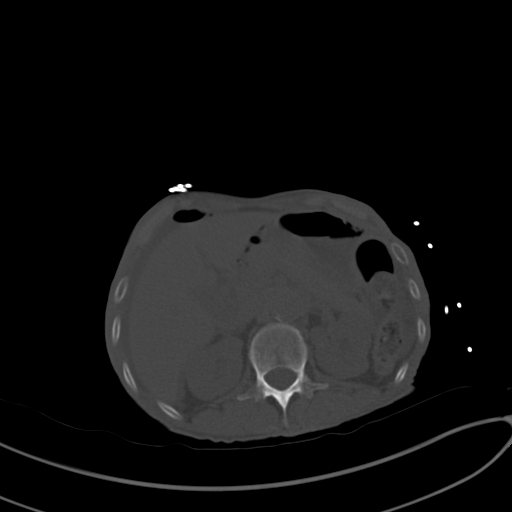
[im 66/79  soft-tissue]
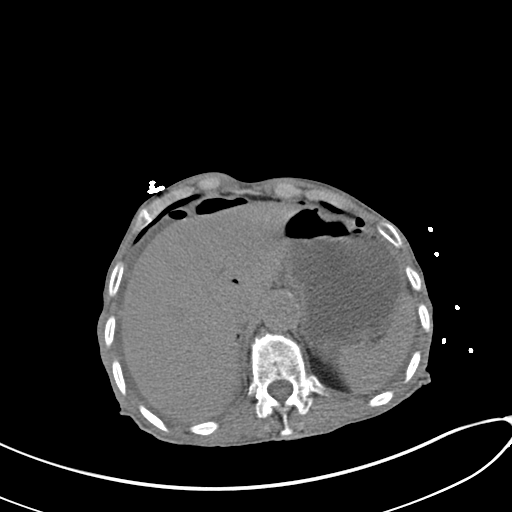
[im 72/79  soft-tissue]
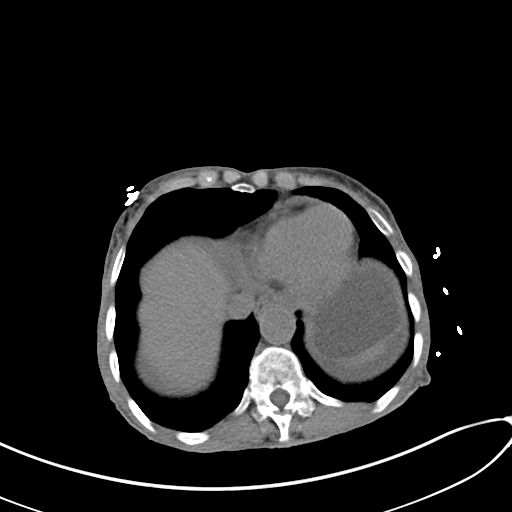

[Series 7: coronal st · coronal · 0.61mm/px · 3 of 101 slices shown]
[im 34/101  soft-tissue]
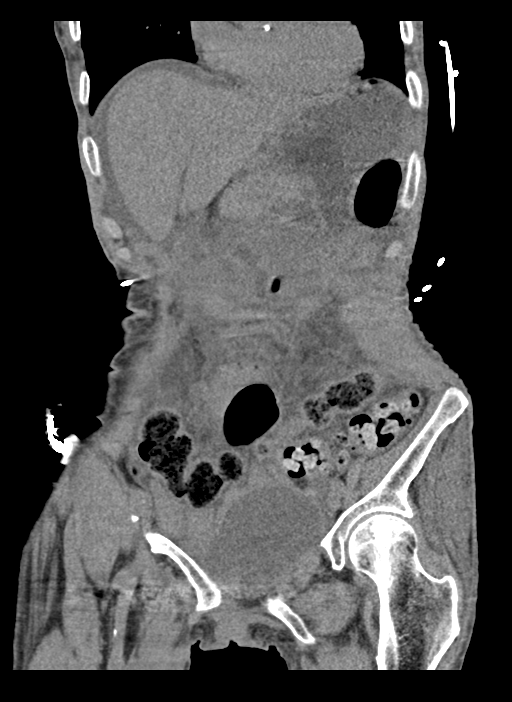
[im 45/101  soft-tissue]
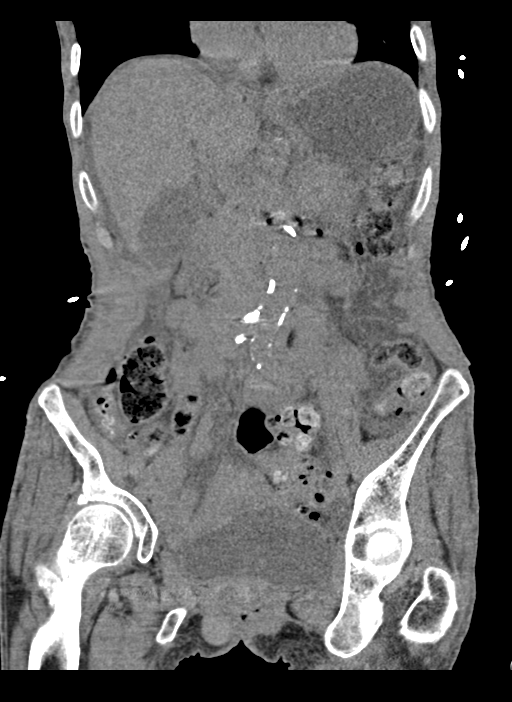
[im 56/101  soft-tissue]
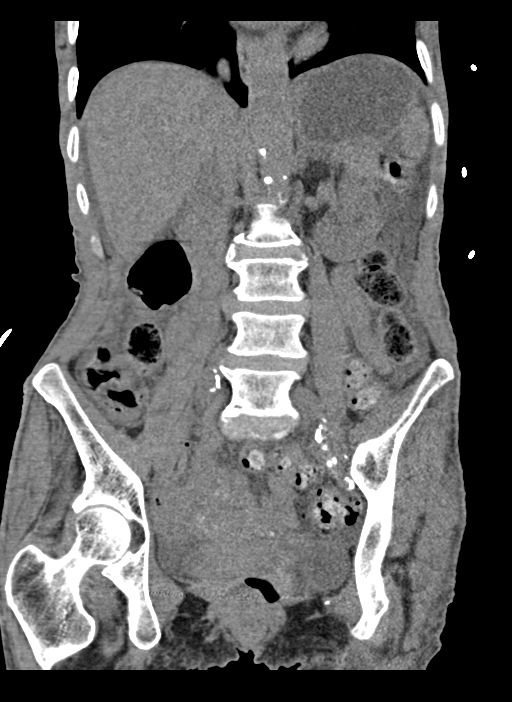

[14 of 46 positions shown; findings below may reference images not displayed]

FINDINGS: Lower chest: No acute abnormality.

Hepatobiliary: No focal liver abnormality is seen. No gallstones,
gallbladder wall thickening, or biliary dilatation.

Pancreas: Unremarkable. No pancreatic ductal dilatation or
surrounding inflammatory changes.

Spleen: Normal in size without focal abnormality.

Adrenals/Urinary Tract: Adrenal glands are unremarkable. Kidneys are
normal, without renal calculi, focal lesion, or hydronephrosis.
Bladder is unremarkable.

Stomach/Bowel: Evaluation of the bowel is markedly limited due to
the paucity of intra-abdominal fat and oral contrast. The stomach
and small bowel are nondistended and grossly unremarkable. Colonic
diverticuli are identified, particularly distally. No convincing
evidence of diverticulitis. The remainder of the colon is grossly
unremarkable. The appendix is best visualized on series 2, images 52
and 53, unremarkable in appearance and air-filled distally.

Vascular/Lymphatic: Calcified atherosclerosis is seen in the
tortuous nonaneurysmal aorta. No obvious adenopathy.

Reproductive: Uterus and bilateral adnexa are unremarkable.

Other: There is free throughout the abdomen. There is also a
moderate amount of free fluid.

Musculoskeletal: Mild AVN in the left femoral head.
IMPRESSION: 1. Free air and free fluid in the abdomen and pelvis is identified.
Findings are consistent with a bowel perforation but no source for
the free air is otherwise seen.
2. Evaluation of the bowel is markedly limited due to the paucity of
intra-abdominal fat and oral contrast. The appendix appears normal
however.
3. Colonic diverticulosis without diverticulitis.
4. Calcified atherosclerosis in the tortuous nonaneurysmal aorta.
5. Mild AVN in the left femoral head.
6. Aortic atherosclerosis.

Findings called to the patient's PA, OLUSOLA.

Aortic Atherosclerosis ([8K]-[8K]).

## 2020-01-10 SURGERY — LAPAROSCOPY, DIAGNOSTIC
Anesthesia: General | Site: Abdomen

## 2020-01-10 MED ORDER — LACTATED RINGERS IV SOLN: INTRAVENOUS | Status: DC

## 2020-01-10 MED ORDER — INFLUENZA VAC SPLIT QUAD 0.5 ML IM SUSY: 0.5000 mL | PREFILLED_SYRINGE | INTRAMUSCULAR | Status: DC | PRN

## 2020-01-10 MED ORDER — SODIUM CHLORIDE 0.9 % IV SOLN: Freq: Three times a day (TID) | INTRAVENOUS | Status: DC | PRN

## 2020-01-10 MED ORDER — ONDANSETRON HCL 4 MG/2ML IJ SOLN
4.0000 mg | Freq: Once | INTRAMUSCULAR | Status: AC
  Administered 2020-01-10: 4 mg via INTRAVENOUS
  Filled 2020-01-10: qty 2

## 2020-01-10 MED ORDER — PIPERACILLIN-TAZOBACTAM 3.375 G IVPB: 3.3750 g | Freq: Three times a day (TID) | INTRAVENOUS | Status: DC

## 2020-01-10 MED ORDER — BUPIVACAINE LIPOSOME 1.3 % IJ SUSP
20.0000 mL | Freq: Once | INTRAMUSCULAR | Status: AC
  Administered 2020-01-10: 20 mL
  Filled 2020-01-10: qty 20

## 2020-01-10 MED ORDER — SODIUM CHLORIDE 0.9 % IV SOLN
2.0000 g | INTRAVENOUS | Status: AC
  Administered 2020-01-10: 2 g via INTRAVENOUS
  Filled 2020-01-10 (×2): qty 2

## 2020-01-10 MED ORDER — ALUM & MAG HYDROXIDE-SIMETH 200-200-20 MG/5ML PO SUSP: 30.0000 mL | Freq: Four times a day (QID) | ORAL | Status: DC | PRN

## 2020-01-10 MED ORDER — METHOCARBAMOL 500 MG PO TABS: 500.0000 mg | ORAL_TABLET | Freq: Four times a day (QID) | ORAL | Status: DC | PRN

## 2020-01-10 MED ORDER — FENTANYL CITRATE (PF) 100 MCG/2ML IJ SOLN
INTRAMUSCULAR | Status: AC
  Filled 2020-01-10: qty 2

## 2020-01-10 MED ORDER — METOPROLOL TARTRATE 5 MG/5ML IV SOLN
5.0000 mg | Freq: Four times a day (QID) | INTRAVENOUS | Status: DC
  Administered 2020-01-10 – 2020-01-12 (×3): 5 mg via INTRAVENOUS
  Filled 2020-01-10 (×5): qty 5

## 2020-01-10 MED ORDER — PANTOPRAZOLE SODIUM 40 MG IV SOLR
40.0000 mg | Freq: Two times a day (BID) | INTRAVENOUS | Status: DC
  Administered 2020-01-14 – 2020-01-17 (×6): 40 mg via INTRAVENOUS
  Filled 2020-01-10 (×6): qty 40

## 2020-01-10 MED ORDER — MENTHOL 3 MG MT LOZG
1.0000 | LOZENGE | OROMUCOSAL | Status: DC | PRN
  Administered 2020-01-10: 3 mg via ORAL
  Filled 2020-01-10: qty 9

## 2020-01-10 MED ORDER — ORAL CARE MOUTH RINSE
15.0000 mL | Freq: Two times a day (BID) | OROMUCOSAL | Status: DC
  Administered 2020-01-10 – 2020-01-23 (×22): 15 mL via OROMUCOSAL

## 2020-01-10 MED ORDER — ONDANSETRON HCL 4 MG/2ML IJ SOLN: 4.0000 mg | Freq: Once | INTRAMUSCULAR | Status: DC | PRN

## 2020-01-10 MED ORDER — HYDROMORPHONE HCL 1 MG/ML IJ SOLN
0.5000 mg | Freq: Once | INTRAMUSCULAR | Status: AC
  Administered 2020-01-10: 0.5 mg via INTRAVENOUS
  Filled 2020-01-10: qty 1

## 2020-01-10 MED ORDER — DEXAMETHASONE SODIUM PHOSPHATE 10 MG/ML IJ SOLN
INTRAMUSCULAR | Status: AC
  Filled 2020-01-10: qty 1

## 2020-01-10 MED ORDER — LIDOCAINE 2% (20 MG/ML) 5 ML SYRINGE
INTRAMUSCULAR | Status: DC | PRN
  Administered 2020-01-10: 50 mg via INTRAVENOUS

## 2020-01-10 MED ORDER — LIP MEDEX EX OINT
1.0000 "application " | TOPICAL_OINTMENT | Freq: Two times a day (BID) | CUTANEOUS | Status: DC
  Administered 2020-01-10 – 2020-01-23 (×24): 1 via TOPICAL
  Filled 2020-01-10 (×3): qty 7

## 2020-01-10 MED ORDER — SODIUM CHLORIDE 0.9 % IV SOLN
80.0000 mg | Freq: Once | INTRAVENOUS | Status: AC
  Administered 2020-01-11: 01:00:00 80 mg via INTRAVENOUS
  Filled 2020-01-10: qty 80

## 2020-01-10 MED ORDER — HEPARIN SODIUM (PORCINE) 5000 UNIT/ML IJ SOLN
5000.0000 [IU] | Freq: Three times a day (TID) | INTRAMUSCULAR | Status: DC
  Administered 2020-01-12 – 2020-01-13 (×4): 5000 [IU] via SUBCUTANEOUS
  Filled 2020-01-10 (×7): qty 1

## 2020-01-10 MED ORDER — HYDROMORPHONE HCL 1 MG/ML IJ SOLN
INTRAMUSCULAR | Status: AC
  Filled 2020-01-10: qty 1

## 2020-01-10 MED ORDER — LACTATED RINGERS IV BOLUS: 1000.0000 mL | Freq: Three times a day (TID) | INTRAVENOUS | Status: AC | PRN

## 2020-01-10 MED ORDER — LACTATED RINGERS IV SOLN: INTRAVENOUS | Status: DC | PRN

## 2020-01-10 MED ORDER — ALBUMIN HUMAN 5 % IV SOLN: INTRAVENOUS | Status: DC | PRN

## 2020-01-10 MED ORDER — 0.9 % SODIUM CHLORIDE (POUR BTL) OPTIME
TOPICAL | Status: DC | PRN
  Administered 2020-01-10: 2000 mL

## 2020-01-10 MED ORDER — METHOCARBAMOL 1000 MG/10ML IJ SOLN
1000.0000 mg | Freq: Four times a day (QID) | INTRAVENOUS | Status: DC | PRN
  Filled 2020-01-10: qty 10

## 2020-01-10 MED ORDER — CHLORHEXIDINE GLUCONATE CLOTH 2 % EX PADS
6.0000 | MEDICATED_PAD | Freq: Every day | CUTANEOUS | Status: DC
  Administered 2020-01-10 – 2020-01-23 (×11): 6 via TOPICAL

## 2020-01-10 MED ORDER — ROCURONIUM BROMIDE 10 MG/ML (PF) SYRINGE
PREFILLED_SYRINGE | INTRAVENOUS | Status: AC
  Filled 2020-01-10: qty 10

## 2020-01-10 MED ORDER — METOPROLOL TARTRATE 5 MG/5ML IV SOLN
5.0000 mg | Freq: Four times a day (QID) | INTRAVENOUS | Status: DC | PRN
  Administered 2020-01-16: 5 mg via INTRAVENOUS
  Filled 2020-01-10: qty 5

## 2020-01-10 MED ORDER — SODIUM CHLORIDE 0.9% IV SOLUTION: Freq: Once | INTRAVENOUS | Status: DC

## 2020-01-10 MED ORDER — SUGAMMADEX SODIUM 200 MG/2ML IV SOLN
INTRAVENOUS | Status: DC | PRN
  Administered 2020-01-10: 200 mg via INTRAVENOUS

## 2020-01-10 MED ORDER — BISACODYL 10 MG RE SUPP: 10.0000 mg | Freq: Two times a day (BID) | RECTAL | Status: DC | PRN

## 2020-01-10 MED ORDER — PHENYLEPHRINE HCL-NACL 10-0.9 MG/250ML-% IV SOLN
INTRAVENOUS | Status: DC | PRN
  Administered 2020-01-10: 50 ug/min via INTRAVENOUS

## 2020-01-10 MED ORDER — ONDANSETRON HCL 4 MG/2ML IJ SOLN
INTRAMUSCULAR | Status: DC | PRN
  Administered 2020-01-10: 4 mg via INTRAVENOUS

## 2020-01-10 MED ORDER — DEXAMETHASONE SODIUM PHOSPHATE 10 MG/ML IJ SOLN
INTRAMUSCULAR | Status: DC | PRN
  Administered 2020-01-10: 5 mg via INTRAVENOUS

## 2020-01-10 MED ORDER — SUCCINYLCHOLINE CHLORIDE 200 MG/10ML IV SOSY
PREFILLED_SYRINGE | INTRAVENOUS | Status: DC | PRN
  Administered 2020-01-10: 100 mg via INTRAVENOUS

## 2020-01-10 MED ORDER — INFLUENZA VAC A&B SA ADJ QUAD 0.5 ML IM PRSY
0.5000 mL | PREFILLED_SYRINGE | INTRAMUSCULAR | Status: DC
  Filled 2020-01-10: qty 0.5

## 2020-01-10 MED ORDER — ROCURONIUM BROMIDE 10 MG/ML (PF) SYRINGE
PREFILLED_SYRINGE | INTRAVENOUS | Status: DC | PRN
  Administered 2020-01-10: 50 mg via INTRAVENOUS

## 2020-01-10 MED ORDER — CHLORHEXIDINE GLUCONATE CLOTH 2 % EX PADS
6.0000 | MEDICATED_PAD | Freq: Once | CUTANEOUS | Status: AC
  Administered 2020-01-10: 6 via TOPICAL

## 2020-01-10 MED ORDER — HYDROMORPHONE HCL 1 MG/ML IJ SOLN
0.5000 mg | INTRAMUSCULAR | Status: DC | PRN
  Administered 2020-01-10 – 2020-01-11 (×3): 1 mg via INTRAVENOUS
  Administered 2020-01-12 (×5): 0.5 mg via INTRAVENOUS
  Administered 2020-01-13: 1 mg via INTRAVENOUS
  Administered 2020-01-13 (×3): 0.5 mg via INTRAVENOUS
  Administered 2020-01-14 (×2): 1 mg via INTRAVENOUS
  Administered 2020-01-14: 0.5 mg via INTRAVENOUS
  Administered 2020-01-15 (×2): 1 mg via INTRAVENOUS
  Filled 2020-01-10 (×18): qty 1

## 2020-01-10 MED ORDER — MENINGOCOCCAL A C Y&W-135 OLIG IM SOLR
0.5000 mL | Freq: Once | INTRAMUSCULAR | Status: DC
  Filled 2020-01-10: qty 0.5

## 2020-01-10 MED ORDER — DIPHENHYDRAMINE HCL 50 MG/ML IJ SOLN
12.5000 mg | Freq: Four times a day (QID) | INTRAMUSCULAR | Status: DC | PRN
  Administered 2020-01-11: 12.5 mg via INTRAVENOUS
  Filled 2020-01-10: qty 1

## 2020-01-10 MED ORDER — HAEMOPHILUS B POLYSAC CONJ VAC IM SOLR
0.5000 mL | INTRAMUSCULAR | Status: DC | PRN
  Filled 2020-01-10: qty 0.5

## 2020-01-10 MED ORDER — LACTATED RINGERS IV SOLN
INTRAVENOUS | Status: AC | PRN
  Administered 2020-01-10: 4000 mL

## 2020-01-10 MED ORDER — SODIUM CHLORIDE 0.9 % IV SOLN
8.0000 mg/h | INTRAVENOUS | Status: AC
  Administered 2020-01-10 – 2020-01-13 (×5): 8 mg/h via INTRAVENOUS
  Filled 2020-01-10 (×8): qty 80

## 2020-01-10 MED ORDER — BUPIVACAINE-EPINEPHRINE 0.25% -1:200000 IJ SOLN
INTRAMUSCULAR | Status: DC | PRN
  Administered 2020-01-10: 30 mL

## 2020-01-10 MED ORDER — DEXTROSE-NACL 5-0.9 % IV SOLN: INTRAVENOUS | Status: DC

## 2020-01-10 MED ORDER — FENTANYL CITRATE (PF) 100 MCG/2ML IJ SOLN
INTRAMUSCULAR | Status: DC | PRN
  Administered 2020-01-10: 100 ug via INTRAVENOUS
  Administered 2020-01-10: 50 ug via INTRAVENOUS

## 2020-01-10 MED ORDER — PHENOL 1.4 % MT LIQD: 2.0000 | OROMUCOSAL | Status: DC | PRN

## 2020-01-10 MED ORDER — LIP MEDEX EX OINT
TOPICAL_OINTMENT | CUTANEOUS | Status: AC
  Filled 2020-01-10: qty 7

## 2020-01-10 MED ORDER — PROPOFOL 10 MG/ML IV BOLUS
INTRAVENOUS | Status: DC | PRN
  Administered 2020-01-10: 70 mg via INTRAVENOUS

## 2020-01-10 MED ORDER — PHENYLEPHRINE 40 MCG/ML (10ML) SYRINGE FOR IV PUSH (FOR BLOOD PRESSURE SUPPORT)
PREFILLED_SYRINGE | INTRAVENOUS | Status: DC | PRN
  Administered 2020-01-10: 120 ug via INTRAVENOUS

## 2020-01-10 MED ORDER — SIMETHICONE 80 MG PO CHEW: 40.0000 mg | CHEWABLE_TABLET | Freq: Four times a day (QID) | ORAL | Status: DC | PRN

## 2020-01-10 MED ORDER — SODIUM CHLORIDE 0.9 % IV SOLN
8.0000 mg | Freq: Four times a day (QID) | INTRAVENOUS | Status: DC | PRN
  Filled 2020-01-10: qty 4

## 2020-01-10 MED ORDER — PIPERACILLIN-TAZOBACTAM 3.375 G IVPB 30 MIN
3.3750 g | Freq: Once | INTRAVENOUS | Status: AC
  Administered 2020-01-10: 3.375 g via INTRAVENOUS
  Filled 2020-01-10: qty 50

## 2020-01-10 MED ORDER — SODIUM CHLORIDE 0.9 % IV BOLUS
1500.0000 mL | Freq: Once | INTRAVENOUS | Status: AC
  Administered 2020-01-10: 1500 mL via INTRAVENOUS

## 2020-01-10 MED ORDER — PIPERACILLIN-TAZOBACTAM IN DEX 2-0.25 GM/50ML IV SOLN
2.2500 g | Freq: Four times a day (QID) | INTRAVENOUS | Status: DC
  Administered 2020-01-11 – 2020-01-12 (×5): 2.25 g via INTRAVENOUS
  Filled 2020-01-10 (×8): qty 50

## 2020-01-10 MED ORDER — ACETAMINOPHEN 650 MG RE SUPP: 650.0000 mg | Freq: Four times a day (QID) | RECTAL | Status: DC | PRN

## 2020-01-10 MED ORDER — ACETAMINOPHEN 500 MG PO TABS
1000.0000 mg | ORAL_TABLET | ORAL | Status: AC
  Administered 2020-01-10: 1000 mg via ORAL
  Filled 2020-01-10: qty 2

## 2020-01-10 MED ORDER — ONDANSETRON HCL 4 MG/2ML IJ SOLN: 4.0000 mg | Freq: Four times a day (QID) | INTRAMUSCULAR | Status: DC | PRN

## 2020-01-10 MED ORDER — ALBUMIN HUMAN 5 % IV SOLN
INTRAVENOUS | Status: AC
  Filled 2020-01-10: qty 500

## 2020-01-10 MED ORDER — PROCHLORPERAZINE EDISYLATE 10 MG/2ML IJ SOLN: 5.0000 mg | INTRAMUSCULAR | Status: DC | PRN

## 2020-01-10 MED ORDER — FENTANYL CITRATE (PF) 100 MCG/2ML IJ SOLN
50.0000 ug | Freq: Once | INTRAMUSCULAR | Status: AC
  Administered 2020-01-10: 50 ug via INTRAVENOUS
  Filled 2020-01-10: qty 2

## 2020-01-10 MED ORDER — ONDANSETRON HCL 4 MG/2ML IJ SOLN
INTRAMUSCULAR | Status: AC
  Filled 2020-01-10: qty 2

## 2020-01-10 MED ORDER — PHENYLEPHRINE HCL (PRESSORS) 10 MG/ML IV SOLN
INTRAVENOUS | Status: DC | PRN
  Administered 2020-01-10: 80 ug via INTRAVENOUS
  Administered 2020-01-10 (×2): 40 ug via INTRAVENOUS

## 2020-01-10 MED ORDER — BUPIVACAINE-EPINEPHRINE (PF) 0.25% -1:200000 IJ SOLN
INTRAMUSCULAR | Status: AC
  Filled 2020-01-10: qty 30

## 2020-01-10 MED ORDER — PROPOFOL 10 MG/ML IV BOLUS
INTRAVENOUS | Status: AC
  Filled 2020-01-10: qty 20

## 2020-01-10 MED ORDER — HYDROMORPHONE HCL 1 MG/ML IJ SOLN
0.2500 mg | INTRAMUSCULAR | Status: DC | PRN
  Administered 2020-01-10 (×2): 0.5 mg via INTRAVENOUS

## 2020-01-10 MED ORDER — MAGIC MOUTHWASH
15.0000 mL | Freq: Four times a day (QID) | ORAL | Status: DC | PRN
  Filled 2020-01-10: qty 15

## 2020-01-10 MED ORDER — PNEUMOCOCCAL 13-VAL CONJ VACC IM SUSP
0.5000 mL | INTRAMUSCULAR | Status: DC | PRN
  Filled 2020-01-10: qty 0.5

## 2020-01-10 SURGICAL SUPPLY — 70 items
APL PRP STRL LF DISP 70% ISPRP (MISCELLANEOUS) ×1
BLADE EXTENDED COATED 6.5IN (ELECTRODE) IMPLANT
BLADE HEX COATED 2.75 (ELECTRODE) IMPLANT
CABLE HIGH FREQUENCY MONO STRZ (ELECTRODE) ×3 IMPLANT
CHLORAPREP W/TINT 26 (MISCELLANEOUS) ×3 IMPLANT
COUNTER NEEDLE 20 DBL MAG RED (NEEDLE) ×3 IMPLANT
COVER MAYO STAND STRL (DRAPES) ×3 IMPLANT
COVER SURGICAL LIGHT HANDLE (MISCELLANEOUS) ×3 IMPLANT
COVER WAND RF STERILE (DRAPES) IMPLANT
DECANTER SPIKE VIAL GLASS SM (MISCELLANEOUS) ×3 IMPLANT
DRAIN CHANNEL 19F RND (DRAIN) ×3 IMPLANT
DRAPE LAPAROSCOPIC ABDOMINAL (DRAPES) ×3 IMPLANT
DRAPE SHEET LG 3/4 BI-LAMINATE (DRAPES) ×3 IMPLANT
DRAPE UTILITY XL STRL (DRAPES) ×3 IMPLANT
DRAPE WARM FLUID 44X44 (DRAPES) ×3 IMPLANT
DRSG OPSITE POSTOP 4X10 (GAUZE/BANDAGES/DRESSINGS) IMPLANT
DRSG OPSITE POSTOP 4X6 (GAUZE/BANDAGES/DRESSINGS) IMPLANT
DRSG OPSITE POSTOP 4X8 (GAUZE/BANDAGES/DRESSINGS) IMPLANT
DRSG TEGADERM 2-3/8X2-3/4 SM (GAUZE/BANDAGES/DRESSINGS) ×6 IMPLANT
DRSG TEGADERM 4X4.75 (GAUZE/BANDAGES/DRESSINGS) ×3 IMPLANT
ELECT REM PT RETURN 15FT ADLT (MISCELLANEOUS) ×3 IMPLANT
GAUZE SPONGE 2X2 8PLY STRL LF (GAUZE/BANDAGES/DRESSINGS) ×2 IMPLANT
GAUZE SPONGE 4X4 12PLY STRL (GAUZE/BANDAGES/DRESSINGS) ×3 IMPLANT
GLOVE ECLIPSE 8.0 STRL XLNG CF (GLOVE) ×6 IMPLANT
GLOVE INDICATOR 8.0 STRL GRN (GLOVE) ×6 IMPLANT
GOWN STRL REUS W/TWL XL LVL3 (GOWN DISPOSABLE) ×9 IMPLANT
HANDLE SUCTION POOLE (INSTRUMENTS) ×2 IMPLANT
IRRIG SUCT STRYKERFLOW 2 WTIP (MISCELLANEOUS) ×6
IRRIGATION SUCT STRKRFLW 2 WTP (MISCELLANEOUS) ×4 IMPLANT
KIT BASIN OR (CUSTOM PROCEDURE TRAY) ×3 IMPLANT
KIT TURNOVER KIT A (KITS) IMPLANT
LEGGING LITHOTOMY PAIR STRL (DRAPES) IMPLANT
NEEDLE INSUFFLATION 14GA 120MM (NEEDLE) ×3 IMPLANT
PACK GENERAL/GYN (CUSTOM PROCEDURE TRAY) ×3 IMPLANT
PAD POSITIONING PINK XL (MISCELLANEOUS) ×3 IMPLANT
PENCIL SMOKE EVACUATOR (MISCELLANEOUS) IMPLANT
POUCH RETRIEVAL ECOSAC 10 (ENDOMECHANICALS) ×4 IMPLANT
POUCH RETRIEVAL ECOSAC 10MM (ENDOMECHANICALS) ×6
PROTECTOR NERVE ULNAR (MISCELLANEOUS) IMPLANT
RELOAD STAPLER WHITE 60MM (STAPLE) ×6 IMPLANT
SCISSORS LAP 5X35 DISP (ENDOMECHANICALS) ×6 IMPLANT
SEALER TISSUE G2 STRG ARTC 35C (ENDOMECHANICALS) ×3 IMPLANT
SET TUBE SMOKE EVAC HIGH FLOW (TUBING) ×3 IMPLANT
SLEEVE XCEL OPT CAN 5 100 (ENDOMECHANICALS) ×6 IMPLANT
SPONGE GAUZE 2X2 STER 10/PKG (GAUZE/BANDAGES/DRESSINGS) ×1
STAPLER ECHELON LONG 60 440 (INSTRUMENTS) ×3 IMPLANT
STAPLER RELOAD WHITE 60MM (STAPLE) ×9
STAPLER VISISTAT 35W (STAPLE) ×3 IMPLANT
SUCTION POOLE HANDLE (INSTRUMENTS) ×3
SUT MNCRL AB 4-0 PS2 18 (SUTURE) ×6 IMPLANT
SUT PDS AB 0 CT 36 (SUTURE) ×6 IMPLANT
SUT PDS AB 1 CTX 36 (SUTURE) IMPLANT
SUT PDS AB 1 TP1 96 (SUTURE) IMPLANT
SUT PROLENE 2 0 SH DA (SUTURE) IMPLANT
SUT SILK 2 0 (SUTURE)
SUT SILK 2 0 SH CR/8 (SUTURE) ×3 IMPLANT
SUT SILK 2-0 18XBRD TIE 12 (SUTURE) IMPLANT
SUT SILK 3 0 (SUTURE)
SUT SILK 3 0 SH CR/8 (SUTURE) ×3 IMPLANT
SUT SILK 3-0 18XBRD TIE 12 (SUTURE) IMPLANT
SUT VICRYL 0 UR6 27IN ABS (SUTURE) IMPLANT
SUT VLOC 180 2-0 9IN GS21 (SUTURE) ×3 IMPLANT
TAPE UMBILICAL COTTON 1/8X30 (MISCELLANEOUS) ×3 IMPLANT
TOWEL OR 17X26 10 PK STRL BLUE (TOWEL DISPOSABLE) ×3 IMPLANT
TOWEL OR NON WOVEN STRL DISP B (DISPOSABLE) ×3 IMPLANT
TRAY FOLEY MTR SLVR 16FR STAT (SET/KITS/TRAYS/PACK) ×3 IMPLANT
TRAY LAPAROSCOPIC (CUSTOM PROCEDURE TRAY) ×3 IMPLANT
TROCAR BLADELESS OPT 5 100 (ENDOMECHANICALS) ×3 IMPLANT
TROCAR XCEL 12X100 BLDLESS (ENDOMECHANICALS) ×3 IMPLANT
TROCAR XCEL NON-BLD 11X100MML (ENDOMECHANICALS) ×3 IMPLANT

## 2020-01-10 NOTE — Progress Notes (Signed)
I discussed operative findings, updated the patient's status, discussed probable steps to recovery, and gave postoperative recommendations to the patient's daughter, Ronnette Juniper.  Recommendations were made.  Questions were answered.  She expressed understanding & appreciation.  Ardeth Sportsman, MD, FACS, MASCRS Gastrointestinal and Minimally Invasive Surgery  [facility] [address] [place], [place] [address] [phone] Fax [phone] Main/Paging  CONTACT INFORMATION: Weekday (9AM-5PM) concerns: Call [facility] main office at [phone] Weeknight (5PM-9AM) or Weekend/Holiday concerns: Check [email or web address] for General Surgery [facility] coverage (Please, do not use SecureChat as it is not reliable communication to operating surgeons for immediate patient care)

## 2020-01-10 NOTE — H&P (Signed)
Tracy Barton  Jul 03, 1945 [id number]  CARE TEAM:  PCP: Patient, No Pcp Per  Outpatient Care Team: Patient Care Team: Patient, No Pcp Per as PCP - General (General Practice)  Inpatient Treatment Team: Treatment Team: Attending Provider: Lorre Nick, MD; Registered Nurse: Jolene Schimke, RN; Physician Assistant: Arthor Captain, PA-C; Technician: Latanya Presser, NT; Consulting Physician: Montez Morita, Md, MD   This patient is a 75 y.o.female who presents today for surgical evaluation at the request of Arthor Captain, PA-C.   Chief complaint / Reason for evaluation: Free air probable perforation  Elderly woman who tends not to see doctors.  Noted severe abdominal pain.  Came emergency room.  Some concern for early shock.  Elevated creatinine concerning for dehydration.  Severe abdominal pain.  CAT scan shows numerous pockets of pneumoperitoneum of uncertain etiology.  Patient denies any ulcer history.  Occasionally takes NSAIDs but not on a regular basis.  There are some evidence in her records of prior episodes of gastritis.  She does smoke.  Denies any history of diverticulitis or bowel obstructions.  She has had a tubal ligation but denies any other abdominal surgery.  Based on concerns for pneumoperitoneum, surgical consultation requested.  Patient denies any history of heart attack or stroke but tends not to see doctors.  She is on vitamin supplements   Assessment  Tracy Barton  75 y.o. female       Problem List:  Active Problems:   Smoker   Gastritis and gastroduodenitis   Trigeminal neuralgia   Hep C w/o coma, chronic (HCC)   Pneumoperitoneum   History of medication noncompliance   ARF (acute renal failure) (HCC)   Pneumoperitoneum with peritonitis of uncertain etiology  Plan:  IV antibiotics  IV fluids  Nausea and pain control  Emergent operative exploration.  Try diagnostic laparoscopy and then plan to laparotomy.  Suspect it is going to be  more of a perforated ulcer since fair amount of the gas seems to be in the upper abdomen and that is where she is most tender.  Perforated bowel from bowel obstruction or diverticulitis other possible etiologies.  I noted the importance of doing emergency surgery to save her life.  She agrees to proceed  The anatomy & physiology of the digestive tract was discussed.  The pathophysiology of perforation was discussed.  Differential diagnosis such as perforated ulcer or colon, etc was discussed.   Natural history risks without surgery such as death was discussed.  I recommended abdominal exploration to diagnose & treat the source of the problem.  Laparoscopic & open techniques were discussed.   Risks such as bleeding, infection, abscess, leak, reoperation, bowel resection, possible ostomy, injury to other organs, need for repair of tissues / organs, hernia, heart attack, death, and other risks were discussed.   The risks of no intervention will lead to serious problems including death.   I expressed a good likelihood that surgery will address the problem.    Goals of post-operative recovery were discussed as well.  We will work to minimize complications although risks in an emergent setting are high.   Questions were answered.  The patient expressed understanding & wishes to proceed with surgery.         -VTE prophylaxis- SCDs, etc -mobilize as tolerated to help recovery  35 minutes spent in review, evaluation, examination, counseling, and coordination of care.  More than 50% of that time was spent in counseling.  Ardeth Sportsman, MD, FACS,  MASCRS Gastrointestinal and Minimally Invasive Surgery  [facility] [address] [place], [place] [address] [phone] Fax [phone] Main/Paging  CONTACT INFORMATION: Weekday (9AM-5PM) concerns: Call [facility] main office at [phone] Weeknight (5PM-9AM) or Weekend/Holiday concerns: Check [email or web address] for General Surgery  [facility] coverage (Please, do not use SecureChat as it is not reliable communication to operating surgeons for immediate patient care)      01/10/2020      Past Medical History:  Diagnosis Date  . Blood transfusion without reported diagnosis   . Depression   . Neuromuscular disorder [facility](HCC)     Past Surgical History:  Procedure Laterality Date  . BRAIN SURGERY    . TUBAL LIGATION      Social History   Socioeconomic History  . Marital status: Divorced    Spouse name: Not on file  . Number of children: Not on file  . Years of education: Not on file  . Highest education level: Not on file  Occupational History  . Not on file  Tobacco Use  . Smoking status: Current Some Day Smoker    Packs/day: 0.50    Years: 53.00    Pack years: 26.50  . Smokeless tobacco: Not on file  Substance and Sexual Activity  . Alcohol use: Yes    Alcohol/week: 2.0 standard drinks    Types: 2 Cans of beer per week  . Drug use: No  . Sexual activity: Not on file  Other Topics Concern  . Not on file  Social History Narrative  . Not on file   Social Determinants of Health   Financial Resource Strain: Not on file  Food Insecurity: Not on file  Transportation Needs: Not on file  Physical Activity: Not on file  Stress: Not on file  Social Connections: Not on file  Intimate Partner Violence: Not on file    Family History  Problem Relation Age of Onset  . Stroke Mother   . Cancer Mother   . Cancer Brother   . Mental illness Maternal Grandmother     Current Facility-Administered Medications  Medication Dose Route Frequency Provider Last Rate Last Admin  . acetaminophen (TYLENOL) tablet 1,000 mg  1,000 mg Oral On Call to OR Karie SodaGross, Velna Hedgecock, MD      . cefoTEtan (CEFOTAN) 2 g in sodium chloride 0.9 % 100 mL IVPB  2 g Intravenous On Call to OR Karie SodaGross, Adiel Erney, MD      . Chlorhexidine Gluconate Cloth 2 % PADS 6 each  6 each Topical Once Karie SodaGross, Lysbeth Dicola, MD      . piperacillin-tazobactam (ZOSYN) IVPB  3.375 g  3.375 g Intravenous Once Cindi CarbonSwayne, Mary M, RPH 100 mL/hr at 01/10/20 1118 3.375 g at 01/10/20 1118   Current Outpatient Medications  Medication Sig Dispense Refill  . acetaminophen (TYLENOL) 500 MG tablet Take 500 mg by mouth every 8 (eight) hours as needed for moderate pain.    . Calcium-Vit D-Arg-Inos-Silicon (BONE DENSITY) 300-200 MG-UNIT TABS Take 1 tablet by mouth 2 (two) times daily. (Patient not taking: Reported on 04/15/2014) 60 tablet 5  . hydrOXYzine (ATARAX/VISTARIL) 25 MG tablet Take 1 tablet (25 mg total) by mouth every 6 (six) hours. 30 tablet 0  . lansoprazole (PREVACID) 30 MG capsule Take 1 capsule (30 mg total) by mouth daily at 12 noon. (Patient not taking: Reported on 04/15/2014) 30 capsule 11  . lansoprazole (PREVACID) 30 MG capsule Take 1 capsule (30 mg total) by mouth daily at 12 noon. 30 capsule  5  . meloxicam (MOBIC) 15 MG tablet Take 15 mg by mouth as needed for pain.    . meloxicam (MOBIC) 15 MG tablet Take 1 tablet (15 mg total) by mouth daily. 30 tablet 0  . methylPREDNISolone (MEDROL DOSEPAK) 4 MG TBPK tablet Take 6-5-4-3-2-1 po qd 21 tablet 0  . oxyCODONE-acetaminophen (PERCOCET) 5-325 MG per tablet Take 1-2 tablets by mouth every 6 (six) hours as needed for severe pain. (Patient not taking: Reported on 04/15/2014) 20 tablet 0  . sucralfate (CARAFATE) 1 G tablet Take 1 tablet (1 g total) by mouth 4 (four) times daily -  with meals and at bedtime. (Patient not taking: Reported on 04/15/2014) 120 tablet 2  . tiZANidine (ZANAFLEX) 4 MG tablet Take 4 mg by mouth every 6 (six) hours as needed for muscle spasms.    Marland Kitchen zoster vaccine live, PF, (ZOSTAVAX) 40086 UNT/0.65ML injection Inject 19,400 Units into the skin once. (Patient not taking: Reported on 04/15/2014) 1 each 0     No Known Allergies  ROS:   All other systems reviewed & are negative except per HPI or as noted below: Constitutional:  No fevers, chills, sweats.  Weight stable Eyes:  No vision changes, No  discharge HENT:  No sore throats, nasal drainage Lymph: No neck swelling, No bruising easily Pulmonary:  No cough, productive sputum CV: No orthopnea, PND  Patient walks 10 minutes for about 1/4 miles without difficulty.  No exertional chest/neck/shoulder/arm pain. GI:  No personal nor family history of GI/colon cancer, inflammatory bowel disease, irritable bowel syndrome, allergy such as Celiac Sprue, dietary/dairy problems, colitis, ulcers nor gastritis.  (However chart mentions evidence of prior endoscopy and gastritis/duodenitis ) no recent sick contacts/gastroenteritis.  No travel outside the country.  No changes in diet. Renal: No UTIs, No hematuria Genital:  No drainage, bleeding, masses Musculoskeletal: No severe joint pain.  Good ROM major joints Skin:  No sores or lesions.  No rashes Heme/Lymph:  No easy bleeding.  No swollen lymph nodes Neuro: No focal weakness/numbness.  No seizures Psych: No suicidal ideation.  No hallucinations  BP (!) 183/80   Pulse 64   Temp 97.6 F (36.4 C) (Oral)   Resp 11   SpO2 96%   Physical Exam: Constitutional: Cachectic.  Hygeine adequate.  Vitals signs as above.   Eyes: Pupils reactive, normal extraocular movements. Sclera nonicteric Neuro: CN II-XII intact.  No major focal sensory defects.  No major motor deficits. Lymph: No head/neck/groin lymphadenopathy Psych:  No severe agitation.  No severe anxiety.  Judgment & insight Adequate, Oriented x4, HENT: Normocephalic, Mucus membranes moist.  No thrush.   Neck: Supple, No tracheal deviation.  No obvious thyromegaly Chest: No pain to chest wall compression.  Good respiratory excursion.  No audible wheezing CV:  Pulses intact.  Regular rhythm.  No major extremity edema Abdomen: Thin  Somewhat firm.  Nondistended.  Diffusely tender with guarding.  Seems to be most sensitive in epigastric region. No incarcerated hernias.  No hepatomegaly.  No splenomegaly Gen:  No inguinal hernias.  No inguinal  lymphadenopathy.   Ext: No obvious deformity or contracture no significant edema.  No cyanosis Skin: No major subcutaneous nodules.  Warm and dry Musculoskeletal: Severe joint rigidity not present.  No obvious clubbing.  No digital petechiae.     Results:   Labs: Results for orders placed or performed during the hospital encounter of 01/10/20 (from the past 48 hour(s))  Comprehensive metabolic panel     Status: Abnormal  Collection Time: 01/10/20  8:25 AM  Result Value Ref Range   Sodium 140 135 - 145 mmol/L    Comment: REPEATED TO VERIFY   Potassium 4.8 3.5 - 5.1 mmol/L    Comment: MODERATE HEMOLYSIS   Chloride 99 98 - 111 mmol/L    Comment: REPEATED TO VERIFY   CO2 18 (L) 22 - 32 mmol/L    Comment: REPEATED TO VERIFY   Glucose, Bld 62 (L) 70 - 99 mg/dL    Comment: Glucose reference range applies only to samples taken after fasting for at least 8 hours.   BUN 29 (H) 8 - 23 mg/dL   Creatinine, Ser 0.98 (H) 0.44 - 1.00 mg/dL   Calcium 9.4 8.9 - 11.9 mg/dL   Total Protein 6.9 6.5 - 8.1 g/dL   Albumin 3.9 3.5 - 5.0 g/dL   AST 29 15 - 41 U/L   ALT 8 0 - 44 U/L   Alkaline Phosphatase 74 38 - 126 U/L   Total Bilirubin 1.0 0.3 - 1.2 mg/dL   GFR, Estimated 30 (L) >60 mL/min    Comment: (NOTE) Calculated using the CKD-EPI Creatinine Equation (2021)    Anion gap 23 (H) 5 - 15    Comment: REPEATED TO VERIFY Performed at [facility], [address], [place], [place] [address]   Lipase, blood     Status: None   Collection Time: 01/10/20  8:25 AM  Result Value Ref Range   Lipase 32 11 - 51 U/L    Comment: Performed at [facility], [address], [place], [place] [address]  CBC with Differential     Status: Abnormal   Collection Time: 01/10/20  8:25 AM  Result Value Ref Range   WBC 6.0 4.0 - 10.5 K/uL   RBC 5.07 3.87 - 5.11 MIL/uL   Hemoglobin 15.3 (H) 12.0 - 15.0 g/dL   HCT 30.8 (H) 65.7 - 84.6 %   MCV 93.5 80.0 - 100.0 fL   MCH 30.2  26.0 - 34.0 pg   MCHC 32.3 30.0 - 36.0 g/dL   RDW 96.2 95.2 - 84.1 %   Platelets 286 150 - 400 K/uL   nRBC 0.0 0.0 - 0.2 %   Neutrophils Relative % 86 %   Neutro Abs 5.1 1.7 - 7.7 K/uL   Lymphocytes Relative 12 %   Lymphs Abs 0.7 0.7 - 4.0 K/uL   Monocytes Relative 2 %   Monocytes Absolute 0.1 0.1 - 1.0 K/uL   Eosinophils Relative 0 %   Eosinophils Absolute 0.0 0.0 - 0.5 K/uL   Basophils Relative 0 %   Basophils Absolute 0.0 0.0 - 0.1 K/uL   Immature Granulocytes 0 %   Abs Immature Granulocytes 0.02 0.00 - 0.07 K/uL    Comment: Performed at [facility], [address], [place], [place] [address]  Lactic acid, plasma     Status: Abnormal   Collection Time: 01/10/20 10:08 AM  Result Value Ref Range   Lactic Acid, Venous 3.1 (HH) 0.5 - 1.9 mmol/L    Comment: CRITICAL RESULT CALLED TO, READ BACK BY AND VERIFIED WITHSallye Ober RN AT 1104 01/10/20 MULLINS,T Performed at [facility], [address] [place], [place] [address]   Ethanol     Status: None   Collection Time: 01/10/20 10:08 AM  Result Value Ref Range   Alcohol, Ethyl (B) <10 <10 mg/dL    Comment: (NOTE) Lowest detectable limit for serum alcohol is 10 mg/dL.  For medical purposes only. Performed at [facility], [address] [place], [place] [address]     Imaging / Studies: CT ABDOMEN PELVIS WO CONTRAST  Result Date: 01/10/2020 CLINICAL DATA:  Abdominal pain. EXAM: CT ABDOMEN AND PELVIS WITHOUT CONTRAST TECHNIQUE: Multidetector CT imaging of the abdomen and pelvis was performed following the standard protocol without IV contrast. COMPARISON:  None. FINDINGS: Lower chest: No acute abnormality. Hepatobiliary: No focal liver abnormality is seen. No gallstones, gallbladder wall thickening, or biliary dilatation. Pancreas: Unremarkable. No pancreatic ductal dilatation or surrounding inflammatory changes. Spleen: Normal in size without focal abnormality.  Adrenals/Urinary Tract: Adrenal glands are unremarkable. Kidneys are normal, without renal calculi, focal lesion, or hydronephrosis. Bladder is unremarkable. Stomach/Bowel: Evaluation of the bowel is markedly limited due to the paucity of intra-abdominal fat and oral contrast. The stomach and small bowel are nondistended and grossly unremarkable. Colonic diverticuli are identified, particularly distally. No convincing evidence of diverticulitis. The remainder of the colon is grossly unremarkable. The appendix is best visualized on series 2, images 52 and 53, unremarkable in appearance and air-filled distally. Vascular/Lymphatic: Calcified atherosclerosis is seen in the tortuous nonaneurysmal aorta. No obvious adenopathy. Reproductive: Uterus and bilateral adnexa are unremarkable. Other: There is free throughout the abdomen. There is also a moderate amount of free fluid. Musculoskeletal: Mild AVN in the left femoral head. IMPRESSION: 1. Free air and free fluid in the abdomen and pelvis is identified. Findings are consistent with a bowel perforation but no source for the free air is otherwise seen. 2. Evaluation of the bowel is markedly limited due to the paucity of intra-abdominal fat and oral contrast. The appendix appears normal however. 3. Colonic diverticulosis without diverticulitis. 4. Calcified atherosclerosis in the tortuous nonaneurysmal aorta. 5. Mild AVN in the left femoral head. 6. Aortic atherosclerosis. Findings called to the patient's PA, Arthor CaptainAbigail Harris. Aortic Atherosclerosis (ICD10-I70.0). Electronically Signed   By: Gerome Samavid  Williams III M.D   On: 01/10/2020 11:09    Medications / Allergies: per chart  Antibiotics: Anti-infectives (From admission, onward)   Start     Dose/Rate Route Frequency Ordered Stop   01/10/20 1145  cefoTEtan (CEFOTAN) 2 g in sodium chloride 0.9 % 100 mL IVPB        2 g 200 mL/hr over 30 Minutes Intravenous On call to O.R. 01/10/20 1133 01/11/20 0559   01/10/20 1115   piperacillin-tazobactam (ZOSYN) IVPB 3.375 g        3.375 g 100 mL/hr over 30 Minutes Intravenous  Once 01/10/20 1106          Note: Portions of this report may have been transcribed using voice recognition software. Every effort was made to ensure accuracy; however, inadvertent computerized transcription errors may be present.   Any transcriptional errors that result from this process are unintentional.    Ardeth SportsmanSteven C. Angeles Zehner, MD, FACS, MASCRS Gastrointestinal and Minimally Invasive Surgery  [facility] [address] [place], [place] [address] [phone] Fax [phone] Main/Paging  CONTACT INFORMATION: Weekday (9AM-5PM) concerns: Call [facility] main office at [phone] Weeknight (5PM-9AM) or Weekend/Holiday concerns: Check [email or web address] for General Surgery [facility] coverage (Please, do not use SecureChat as it is not reliable communication to operating surgeons for immediate patient care)      01/10/2020  11:33 AM

## 2020-01-10 NOTE — Transfer of Care (Signed)
Immediate Anesthesia Transfer of Care Note  Patient: Tracy Barton  Procedure(s) Performed: LAPAROSCOPY DIAGNOSTIC laparoscopic washout omental patch splenectomy (N/A Abdomen) EXPLORATORY LAPAROTOMY (N/A )  Patient Location: PACU  Anesthesia Type:General  Level of Consciousness: awake, drowsy and patient cooperative  Airway & Oxygen Therapy: Patient Spontanous Breathing and Patient connected to face mask oxygen  Post-op Assessment: Report given to RN and Post -op Vital signs reviewed and stable  Post vital signs: Reviewed and stable  Last Vitals:  Vitals Value Taken Time  BP    Temp    Pulse 86 01/10/20  Resp    SpO2 100% 01/10/20 1454    Last Pain:  Vitals:   01/10/20 1117  TempSrc:   PainSc: 10-Worst pain ever         Complications: No complications documented.

## 2020-01-10 NOTE — Anesthesia Preprocedure Evaluation (Addendum)
Anesthesia Evaluation  Patient identified by MRN, date of birth, ID band Patient awake    Reviewed: Allergy & Precautions, NPO status , Patient's Chart, lab work & pertinent test results  Airway Mallampati: II  TM Distance: >3 FB Neck ROM: Full    Dental  (+) Edentulous Upper, Edentulous Lower   Pulmonary Current Smoker,    Pulmonary exam normal breath sounds clear to auscultation       Cardiovascular negative cardio ROS Normal cardiovascular exam Rhythm:Regular Rate:Normal     Neuro/Psych PSYCHIATRIC DISORDERS Depression negative neurological ROS     GI/Hepatic negative GI ROS, (+)     substance abuse  alcohol use, Hepatitis -, C  Endo/Other  negative endocrine ROS  Renal/GU Renal InsufficiencyRenal disease     Musculoskeletal negative musculoskeletal ROS (+)   Abdominal   Peds  Hematology negative hematology ROS (+)   Anesthesia Other Findings   Reproductive/Obstetrics                            Anesthesia Physical Anesthesia Plan  ASA: III and emergent  Anesthesia Plan: General   Post-op Pain Management:    Induction: Intravenous, Rapid sequence and Cricoid pressure planned  PONV Risk Score and Plan: 4 or greater and Ondansetron, Dexamethasone and Treatment may vary due to age or medical condition  Airway Management Planned: Oral ETT  Additional Equipment: None  Intra-op Plan:   Post-operative Plan: Possible Post-op intubation/ventilation  Informed Consent: I have reviewed the patients History and Physical, chart, labs and discussed the procedure including the risks, benefits and alternatives for the proposed anesthesia with the patient or authorized representative who has indicated his/her understanding and acceptance.     Dental advisory given  Plan Discussed with: CRNA  Anesthesia Plan Comments: (2 x PIV)       Anesthesia Quick Evaluation

## 2020-01-10 NOTE — Progress Notes (Signed)
A consult was received from an ED provider for piperacillin/tazobactam per pharmacy dosing.  The patient's profile has been reviewed for ht/wt/allergies/indication/available labs.    A one time order has been placed for piperacillin/tazobactam 3.375 g IV once over 30 minutes.    Further antibiotics/pharmacy consults should be ordered by admitting physician if indicated.                       Thank you, Cindi Carbon, PharmD 01/10/2020  11:06 AM

## 2020-01-10 NOTE — Progress Notes (Signed)
PHARMACY NOTE:  ANTIMICROBIAL RENAL DOSAGE ADJUSTMENT  Current antimicrobial regimen includes a mismatch between antimicrobial dosage and estimated renal function.  As per policy approved by the Pharmacy & Therapeutics and Medical Executive Committees, the antimicrobial dosage will be adjusted accordingly.  Current antimicrobial dosage: Zosyn 3.375gm IV Q8h to be infused over 4hrs  Indication: intra-abdominal infection  Renal Function:  Estimated Creatinine Clearance: 17.1 mL/min (A) (by C-G formula based on SCr of 1.74 mg/dL (H)). []      On intermittent HD, scheduled: []      On CRRT    Antimicrobial dosage has been changed to:  Zosyn 2.25gm IV q6h  Additional comments:   Thank you for allowing pharmacy to be a part of this patient's care.  , [facility] 01/10/2020 9:40 PM

## 2020-01-10 NOTE — ED Triage Notes (Addendum)
Patient reports RLQ abdominal pain x1 hour with n/v. Appendix still intact. Denies diarrhea. Last normal BM was just prior to pain starting.   Fentanyl 100 mcg 22 L forearm  Bp 156/80 P 60 CBG 86 RR 24

## 2020-01-10 NOTE — Anesthesia Procedure Notes (Signed)
Procedure Name: Intubation Date/Time: 01/10/2020 1:02 PM Performed by: West Pugh, CRNA Pre-anesthesia Checklist: Patient identified, Emergency Drugs available, Suction available, Patient being monitored and Timeout performed Patient Re-evaluated:Patient Re-evaluated prior to induction Oxygen Delivery Method: Circle system utilized Preoxygenation: Pre-oxygenation with 100% oxygen Induction Type: IV induction, Rapid sequence and Cricoid Pressure applied Laryngoscope Size: Mac and 3 Grade View: Grade I Tube type: Oral Tube size: 7.0 mm Number of attempts: 1 Airway Equipment and Method: Stylet Placement Confirmation: ETT inserted through vocal cords under direct vision,  positive ETCO2,  CO2 detector and breath sounds checked- equal and bilateral Secured at: 21 cm Tube secured with: Tape Dental Injury: Teeth and Oropharynx as per pre-operative assessment

## 2020-01-10 NOTE — ED Provider Notes (Signed)
[facility]-EMERGENCY DEPT Provider Note   CSN: [id number] Arrival date & time: 01/10/20  0737     History Chief Complaint  Patient presents with  . Abdominal Pain    Tracy Barton is a 75 y.o. female with a past medical history of chronic hepatitis C, trigeminal neuralgia, daily smoking, history of gastritis, unexplained weight loss and vitamin D deficiency.  The patient reports that she has no medical problems.  She presents with a chief complaint of abdominal pain.  She states she had onset of severe nausea and vomiting suddenly in the middle of the night.  She had multiple episodes of nonbloody nonbilious vomitus followed by onset of pain in the right middle part of her abdomen.  She describes the pain as sharp, 10 out of 10 and severe.  Nothing seems to make it worse or better.  She denies any urinary symptoms or flank pain.  The pain does not radiate.  She denies constipation or diarrhea.  She denies fever or chills.  She has never had anything like this before.  She has no previous abdominal surgeries.  HPI     Past Medical History:  Diagnosis Date  . Blood transfusion without reported diagnosis   . Depression   . Neuromuscular disorder [facility])     Patient Active Problem List   Diagnosis Date Noted  . Perforated prepyloric gastric ulcer s/p omental Cheree Ditto patch 01/10/2020 01/10/2020  . History of medication noncompliance 01/10/2020  . ARF (acute renal failure) (HCC) 01/10/2020  . 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 Hep C w/o coma, chronic (HCC) 09/12/2014  . Head injury 09/11/2014  . Back injury 04/15/2014  . Smoker 03/24/2013  . Gastritis and gastroduodenitis 03/24/2013  . Loss of weight 03/24/2013  . Unspecified vitamin D deficiency 03/24/2013  . Trigeminal neuralgia 06/01/2011    Past Surgical History:  Procedure Laterality Date  . BRAIN SURGERY    . TUBAL LIGATION       OB History   No obstetric history on file.     Family  History  Problem Relation Age of Onset  . Stroke Mother   . Cancer Mother   . Cancer Brother   . Mental illness Maternal Grandmother     Social History   Tobacco Use  . Smoking status: Current Some Day Smoker    Packs/day: 0.50    Years: 53.00    Pack years: 26.50  Substance Use Topics  . Alcohol use: Yes    Alcohol/week: 2.0 standard drinks    Types: 2 Cans of beer per week  . Drug use: No    Home Medications Prior to Admission medications   Medication Sig Start Date End Date Taking? Authorizing Provider  acetaminophen (TYLENOL) 500 MG tablet Take 500 mg by mouth every 8 (eight) hours as needed for moderate pain.    [provider]  Calcium-Vit D-Arg-Inos-Silicon (BONE DENSITY) 300-200 MG-UNIT TABS Take 1 tablet by mouth 2 (two) times daily. Patient not taking: Reported on 04/15/2014 12/02/12   Carmelina Dane, MD  hydrOXYzine (ATARAX/VISTARIL) 25 MG tablet Take 1 tablet (25 mg total) by mouth every 6 (six) hours. 12/07/15   Deatra Canter, FNP  lansoprazole (PREVACID) 30 MG capsule Take 1 capsule (30 mg total) by mouth daily at 12 noon. Patient not taking: Reported on 04/15/2014 03/24/13   Collene Gobble, MD  lansoprazole (PREVACID) 30 MG capsule Take 1 capsule (30 mg total) by mouth daily at 12 noon. 06/22/14  Carmelina Dane, MD  methylPREDNISolone (MEDROL DOSEPAK) 4 MG TBPK tablet Take 6-5-4-3-2-1 po qd 12/07/15   Deatra Canter, FNP  oxyCODONE-acetaminophen (PERCOCET) 5-325 MG per tablet Take 1-2 tablets by mouth every 6 (six) hours as needed for severe pain. Patient not taking: Reported on 04/15/2014 05/12/13   Piepenbrink, Victorino Dike, PA-C  sucralfate (CARAFATE) 1 G tablet Take 1 tablet (1 g total) by mouth 4 (four) times daily -  with meals and at bedtime. Patient not taking: Reported on 04/15/2014 03/24/13   Collene Gobble, MD  tiZANidine (ZANAFLEX) 4 MG tablet Take 4 mg by mouth every 6 (six) hours as needed for muscle spasms.    [provider]   zoster vaccine live, PF, (ZOSTAVAX) 15400 UNT/0.65ML injection Inject 19,400 Units into the skin once. Patient not taking: Reported on 04/15/2014 03/24/13   Collene Gobble, MD    Allergies    Nsaids  Review of Systems   Review of Systems Ten systems reviewed and are negative for acute change, except as noted in the HPI.   Physical Exam Updated Vital Signs BP (!) 94/44 (BP Location: Left Arm)   Pulse 86   Temp (!) 94 F (34.4 C) Comment: bare hugger applied  Resp 12   Ht 5\' 3"  (1.6 m)   Wt 49 kg   SpO2 100%   BMI 19.14 kg/m   Physical Exam Vitals and nursing note reviewed.  Constitutional:      General: She is not in acute distress.    Appearance: She is underweight and well-nourished. She is ill-appearing. She is not toxic-appearing or diaphoretic.     Comments: 75 year old female who appears older than stated age Patient is severely underweight if not borderline cachectic with mild temporal wasting  HENT:     Head: Normocephalic and atraumatic.     Mouth/Throat:     Comments: Edentulous Eyes:     General: No scleral icterus.    Conjunctiva/sclera: Conjunctivae normal.  Cardiovascular:     Rate and Rhythm: Normal rate and regular rhythm.     Heart sounds: Normal heart sounds. No murmur heard. No friction rub. No gallop.   Pulmonary:     Effort: Pulmonary effort is normal. No respiratory distress.     Breath sounds: Normal breath sounds.  Abdominal:     General: Bowel sounds are normal. There is no distension.     Palpations: Abdomen is soft. There is no mass.     Tenderness: There is abdominal tenderness. There is guarding.  Musculoskeletal:     Cervical back: Normal range of motion.  Skin:    General: Skin is warm and dry.  Neurological:     Mental Status: She is alert and oriented to person, place, and time.  Psychiatric:        Behavior: Behavior normal.     ED Results / Procedures / Treatments   Labs (all labs ordered are listed, but only abnormal  results are displayed) Labs Reviewed  COMPREHENSIVE METABOLIC PANEL - Abnormal; Notable for the following components:      Result Value   CO2 18 (*)    Glucose, Bld 62 (*)    BUN 29 (*)    Creatinine, Ser 1.74 (*)    GFR, Estimated 30 (*)    Anion gap 23 (*)    All other components within normal limits  CBC WITH DIFFERENTIAL/PLATELET - Abnormal; Notable for the following components:   Hemoglobin 15.3 (*)    HCT 47.4 (*)  All other components within normal limits  URINALYSIS, ROUTINE W REFLEX MICROSCOPIC - Abnormal; Notable for the following components:   APPearance HAZY (*)    Hgb urine dipstick SMALL (*)    Ketones, ur 20 (*)    Protein, ur 30 (*)    Leukocytes,Ua LARGE (*)    WBC, UA >50 (*)    Bacteria, UA RARE (*)    All other components within normal limits  LACTIC ACID, PLASMA - Abnormal; Notable for the following components:   Lactic Acid, Venous 3.1 (*)    All other components within normal limits  CULTURE, BLOOD (ROUTINE X 2)  RESP PANEL BY RT-PCR (FLU A&B, COVID) ARPGX2  CULTURE, BLOOD (ROUTINE X 2)  LIPASE, BLOOD  ETHANOL  TYPE AND SCREEN  ABO/RH  SURGICAL PATHOLOGY    EKG None  Radiology CT ABDOMEN PELVIS WO CONTRAST  Result Date: 01/10/2020 CLINICAL DATA:  Abdominal pain. EXAM: CT ABDOMEN AND PELVIS WITHOUT CONTRAST TECHNIQUE: Multidetector CT imaging of the abdomen and pelvis was performed following the standard protocol without IV contrast. COMPARISON:  None. FINDINGS: Lower chest: No acute abnormality. Hepatobiliary: No focal liver abnormality is seen. No gallstones, gallbladder wall thickening, or biliary dilatation. Pancreas: Unremarkable. No pancreatic ductal dilatation or surrounding inflammatory changes. Spleen: Normal in size without focal abnormality. Adrenals/Urinary Tract: Adrenal glands are unremarkable. Kidneys are normal, without renal calculi, focal lesion, or hydronephrosis. Bladder is unremarkable. Stomach/Bowel: Evaluation of the bowel is  markedly limited due to the paucity of intra-abdominal fat and oral contrast. The stomach and small bowel are nondistended and grossly unremarkable. Colonic diverticuli are identified, particularly distally. No convincing evidence of diverticulitis. The remainder of the colon is grossly unremarkable. The appendix is best visualized on series 2, images 52 and 53, unremarkable in appearance and air-filled distally. Vascular/Lymphatic: Calcified atherosclerosis is seen in the tortuous nonaneurysmal aorta. No obvious adenopathy. Reproductive: Uterus and bilateral adnexa are unremarkable. Other: There is free throughout the abdomen. There is also a moderate amount of free fluid. Musculoskeletal: Mild AVN in the left femoral head. IMPRESSION: 1. Free air and free fluid in the abdomen and pelvis is identified. Findings are consistent with a bowel perforation but no source for the free air is otherwise seen. 2. Evaluation of the bowel is markedly limited due to the paucity of intra-abdominal fat and oral contrast. The appendix appears normal however. 3. Colonic diverticulosis without diverticulitis. 4. Calcified atherosclerosis in the tortuous nonaneurysmal aorta. 5. Mild AVN in the left femoral head. 6. Aortic atherosclerosis. Findings called to the patient's PA, Arthor CaptainAbigail Marvelle Span. Aortic Atherosclerosis (ICD10-I70.0). Electronically Signed   By: Gerome Samavid  Williams III M.D   On: 01/10/2020 11:09    Procedures .Critical Care Performed by: Arthor CaptainHarris, Neda Willenbring, PA-C Authorized by: Arthor CaptainHarris, Eloisa Chokshi, PA-C   Critical care provider statement:    Critical care time (minutes):  60   Critical care time was exclusive of:  Separately billable procedures and treating other patients   Critical care was necessary to treat or prevent imminent or life-threatening deterioration of the following conditions:  Metabolic crisis (Surgical abdomen with free air)   Critical care was time spent personally by me on the following activities:   Discussions with consultants, evaluation of patient's response to treatment, examination of patient, ordering and performing treatments and interventions, ordering and review of laboratory studies, ordering and review of radiographic studies, pulse oximetry, re-evaluation of patient's condition, obtaining history from patient or surrogate and review of old charts   (including critical care time)  Medications Ordered in ED Medications  dextrose 5 %-0.9 % sodium chloride infusion (has no administration in time range)  0.9 %  sodium chloride infusion (Manually program via Guardrails IV Fluids) ( Intravenous MAR Hold 01/10/20 1335)  0.9 % irrigation (POUR BTL) (2,000 mLs Irrigation Given 01/10/20 1401)  lactated ringers infusion (4,000 mLs  New Bag/Given 01/10/20 1401)  bupivacaine-EPINEPHrine (MARCAINE W/ EPI) 0.25% -1:200000 (with pres) injection (30 mLs Infiltration Given 01/10/20 1434)  sodium chloride 0.9 % bolus 1,500 mL (1,500 mLs Intravenous Bolus 01/10/20 1000)  ondansetron (ZOFRAN) injection 4 mg (4 mg Intravenous Given 01/10/20 1000)  fentaNYL (SUBLIMAZE) injection 50 mcg (50 mcg Intravenous Given 01/10/20 1000)  HYDROmorphone (DILAUDID) injection 0.5 mg (0.5 mg Intravenous Given 01/10/20 1117)  piperacillin-tazobactam (ZOSYN) IVPB 3.375 g (0 g Intravenous Stopped 01/10/20 1200)  Chlorhexidine Gluconate Cloth 2 % PADS 6 each (6 each Topical Given 01/10/20 1200)  cefoTEtan (CEFOTAN) 2 g in sodium chloride 0.9 % 100 mL IVPB ( Intravenous MAR Hold 01/10/20 1335)  acetaminophen (TYLENOL) tablet 1,000 mg (1,000 mg Oral Given 01/10/20 1200)  bupivacaine liposome (EXPAREL) 1.3 % injection 266 mg (20 mLs Infiltration Given 01/10/20 1434)    ED Course  I have reviewed the triage vital signs and the nursing notes.  Pertinent labs & imaging results that were available during my care of the patient were reviewed by me and considered in my medical decision making (see chart for details).  Clinical Course as of 01/10/20  1523  Sat Jan 10, 2020  1110 The patient has free air  [AH]    Clinical Course User Index [AH] Arthor CaptainHarris, Stormie Ventola, PA-C   MDM Rules/Calculators/A&P                          NW:[id number]:abdominal pain VS: BP (!) 94/44 (BP Location: Left Arm)   Pulse 86   Temp (!) 94 F (34.4 C) Comment: bare hugger applied  Resp 12   Ht 5\' 3"  (1.6 m)   Wt 49 kg   SpO2 100%   BMI 19.14 kg/m   MV:HQIONGEHX:History is gathered by patient and emr. Previous records obtained and reviewed. DDX:The patient's complaint of abdominal pain involves an extensive number of diagnostic and treatment options, and is a complaint that carries with it a high risk of complications, morbidity, and potential mortality. Given the large differential diagnosis, medical decision making is of high complexity. The differential diagnosis for generalized abdominal pain includes, but is not limited to AAA, gastroenteritis, appendicitis, Bowel obstruction, Bowel perforation. Gastroparesis, DKA, Hernia, Inflammatory bowel disease, mesenteric ischemia, pancreatitis, peritonitis SBP, volvulus.  Labs: I ordered reviewed and interpreted labs which include CBC which shows elevated hemoglobin level, no white blood cell count, CMP with low bicarb level at 18, elevated creatinine showing AKI, anion gap of 23,000, GFR less than 30. Urine pending  imaging: I ordered and reviewed images which included CT abdomen and pelvis without contrast. I independently visualized and interpreted all imaging. Significant findings include diffuse free air in the abdomen of unknown source.  EKG: 62 nsr Consults: Case discussed with Dr. Michaell CowingGross and Dr. Ronaldo MiyamotoKyle of the hospitalist XBM:[id number]:Patient with acute surgical abdomen. Patient will be admitted. She is receiving IV zosyn Patient disposition:The patient appears reasonably stabilized for admission considering the current resources, flow, and capabilities available in the ED at this time, and I doubt any other [facility] further screening  and/or treatment in the ED prior to admission.  Final Clinical Impression(s) / ED Diagnoses Final diagnoses:  Pneumoperitoneum of unknown etiology    Rx / DC Orders ED Discharge Orders    None       Arthor Captain, PA-C 01/10/20 1523    Lorre Nick, MD 01/11/20 [phone]

## 2020-01-10 NOTE — ED Notes (Signed)
Date and time results received: 01/10/20 11:05 AM   Test: Lactic Acid Critical Value: 3.1  Name of Provider Notified: Freida Busman, MD  Orders Received? Or Actions Taken?:

## 2020-01-10 NOTE — ED Notes (Signed)
Patient placed on purewick catheter and encouraged to provide urine sample when possible.

## 2020-01-10 NOTE — ED Provider Notes (Signed)
Medical screening examination/treatment/procedure(s) were conducted as a shared visit with non-physician practitioner(s) and myself.  I personally evaluated the patient during the encounter.    75 year old female here with abdominal discomfort.  Has evidence of free air on her abdominal CT.  Antibiotics started.  Case discussed with Dr. [name] will come and see patient   Lorre Nick, MD 01/10/20 1134

## 2020-01-10 NOTE — Op Note (Signed)
01/10/2020  2:50 PM  PATIENT:  Tracy Barton  75 y.o. female  Patient Care Team: Patient, No Pcp Per as PCP - General (General Practice)  PRE-OPERATIVE DIAGNOSIS:  free air  POST-OPERATIVE DIAGNOSIS:  PERFORATED PREPYLORIC GASTRIC ULCER  PROCEDURE:   LAPAROSCOPIC OMENTAL GRAHAM PATCH OF ULCER LAPAROSCOPIC SPLENECTOMY LAPAROSCOPIC WASHOUT/LYSIS OF ADHESIONS  SURGEON:  Ardeth Sportsman, MD  ASSISTANT: OR Staff   ANESTHESIA:   local and general  EBL:  Total I/O In: 1600 [I.V.:1000; IV Piggyback:600] Out: 650 [Urine:150; Blood:500]  Delay start of Pharmacological VTE agent (>24hrs) due to surgical blood loss or risk of bleeding:  yes  DRAINS: none   SPECIMEN:  Spleen  DISPOSITION OF SPECIMEN:  PATHOLOGY  COUNTS:  YES  PLAN OF CARE: Admit to inpatient   PATIENT DISPOSITION:  PACU - guarded condition.  INDICATION:   Patient with evidence of free air and perforation.  Suspicion of perforated ulcer.  I recommended surgery:  The anatomy & physiology of the digestive tract was discussed.  The pathophysiology of perforation was discussed.  Differential diagnosis such as perforated ulcer or colon, etc was discussed.   Natural history risks without surgery such as death was discussed.  I recommended abdominal exploration to diagnose & treat the source of the problem.  Laparoscopic & open techniques were discussed.   Risks such as bleeding, infection, abscess, leak, reoperation, bowel resection, possible ostomy, hernia, heart attack, death, and other risks were discussed.   The risks of no intervention will lead to serious problems including death.   I expressed a good likelihood that surgery will address the problem.    Goals of post-operative recovery were discussed as well.  We will work to minimize complications although risks in an emergent setting are high.   Questions were answered.  The patient expressed understanding & wishes to proceed with surgery.      OR FINDINGS:    Perforated ulcer at the prepyloric region of stomach with phlegmon and purulence and biliary ascites. Omental patch done.  Phlegmon in left upper quadrant densely adherent to the spleen.  Splenic laceration with washout requiring splenectomy   CASE DATA:  Type of patient?: LDOW CASE (Surgical Hospitalist [facility] Inpatient)  Status of Case? EMERGENT Add On  Infection Present At Time Of Surgery (PATOS)?  PHLEGMON, PURULENCE  DESCRIPTION:   Informed consent was confirmed.  The patient underwent general anaesthesia without difficulty.  The patient was positioned appropriately.  VTE prevention in place.  The patient's abdomen was clipped, prepped, & draped in a sterile fashion.  Surgical timeout confirmed our plan.  The patient was positioned in reverse Trendelenburg.  Abdominal entry was used using a Varess needle in the left subcostal ridge.  I induced No peritoneum.  After good insufflation I placed a 5 mm port through the Varess site using optical entry technique in the left upper abdomen.  Entry was clean.  I induced carbon dioxide insufflation.  Camera inspection revealed no injury.  Extra ports were carefully placed under direct laparoscopic visualization.  Peritonitis could be seen especially in the upper abdomen with phlegmon and purulence.  There is biliary ascites in the entire abdomen including the pelvis.  Moderate phlegmon in the left upper quadrant as well.  Some around the right upper quadrant.  Did aspiration of biliary ascites and washout of several liters.   I did inspection and could find that the antrum and especially the pylorus is quite swollen and inflamed.  I found a punched out  perforation consistent with an anterior prepyloric ulcer perforation 22mm smooth edge.  No nodularity or carcinomatosis.  She had a redundant falciform ligament that was unsuccessful and patching this off.  I was able to find some omentum that could reach up. Had anesthesia repositioned nasogastric tube I  was as it was coiled in the fundus.  Pulled back and then readvanced along the greater curvature with the tip down into the antrum.  Secured.  Patient had significant phlegmon and purulence in the left upper quadrant.  Washing that area encountered some bleeding.  Splenic laceration noted as the spleen was densely adherent to the phlegmon.  I could not salvage the spleen.  Therefore I did a complete splenectomy.  Freed short gastrics off the stomach with an Enseal vessel sealer.  I was able to mobilize the spleen in a lateral medial fashion and elevated it anteriorly until only the hilum was attached to the spleen.  Transected across the hilum using a white load vascular 60 mm laparoscopic stapler x2 firings to good result.  I mobilized a tongue of greater omentum.  I laid it over the perforated ulcer.  I proceeded to do an omental patch by placing 2-0 Vlock running absorbable serrated suture starting distal and superior to the perforation.  Took a bite of omentum in the middle and then brought that around in a running horizontal mattress fashion.  Did that x4 runs until there was a healthy wad of omentum plugging up with the perforation in a classic Graham patch fashion.  I brought the falciform ligament down and ran the V lock through that to help plug and patch the region down with it as well.  We did copious irrigation and reinspection of the abdominal cavity.  There is no frank evidence of appendicitis or diverticulitis.  No evidence of a perforated Meckel's diverticulum.  No diverticulitis.  Redundant colon full of soft stool.  Normal size uterus without any fibroids.  Mildly enlarged right ovary but smooth.  We carefully mobilized the omentum and small bowel, focusing along the stomach and duodenal sweep.  There were no more loculations.  I placed the spleen and in ecosac.  Completed irrigation and ensured there was no evidence of any gastric injury or bleeding.  Well away from the pancreatic tip.  I  had done a total of 5 L of irrigation.    I pulled ecosac out the left subcostal stapler port site.  Was able to pull it up with minimal dilatation.  Reflective of the fragile poor quality of the spleen in this elderly woman.  We evacuated carbon dioxide.  I closed the subcostal fascial defect using 0 PDS interrupted sutures to good result.  25 mm fascial defect.  I closed the ports using Monocryl stitch a sterile dressings.  Despite the increased bleeding from the splenic laceration she did rather well and not needing pressors.  However given her advanced age with higher than anticipated blood loss and significant peritonitis, I think it would be wise to watch her the stepdown unit.  Anesthesia agrees.    I made an attempt to locate family to discuss patient's status and recommendations.  Try to reach her friend who is a point of contact with no answer.  No one is available at this time.  We will try again later   Ardeth Sportsman, M.D., F.A.C.S. Gastrointestinal and Minimally Invasive Surgery [facility] [address] [place], [place] [address] [phone] Main / Paging

## 2020-01-10 NOTE — Consult Note (Addendum)
Medical Consultation  Tracy Barton HCW:[id number] DOB: 01-04-1945 DOA: 01/10/2020 PCP: Patient, No Pcp Per   Requesting physician: Dr. [name] Date of consultation: 01/10/20 Reason for consultation: Medical Mgmt  Impression/Recommendations Perforated Bowel     - per primary team (abx, pain meds, etc)     - going to surgery today  AKI     - likely secondary to above     - no renal/bladder obstruction see on CT     - fluids  Hypoglycemia N/V     - likely secondary to above     - anti-emetics, D5NS  HAGMA     - likely secondary to above     - fluids  Severe protein-calorie malnutrition     - dietary consult after surgery  EtOH abuse     - CIWA     - counsel against further use  Tobacco abuse     - counsel against further use  TRH will follow-up again tomorrow. Please contact me if I can be of assistance in the meanwhile. Thank you for this consultation.  Chief Complaint: abdominal pain  HPI:  Tracy Barton is a 75 y.o. female with no significant medical history. Presenting to ED w/ abdominal pain that started suddenly last night. Patient is in too much pain to give good history. She does report her pain has been present about about a week. It's "all over" her stomach. She can not tell me if she has tried anything to alleviate her symptoms. She says everything hurts it. She came to the ED because she could no longer take the pain.   Review of Systems:  Reports abdominal pain. Unable/unwilling to answer anything else.   Past Medical History:  Diagnosis Date  . Blood transfusion without reported diagnosis   . Depression   . Neuromuscular disorder [facility])    Past Surgical History:  Procedure Laterality Date  . BRAIN SURGERY    . TUBAL LIGATION     Social History:  reports that she has been smoking. She has a 26.50 pack-year smoking history. She does not have any smokeless tobacco history on file. She reports current alcohol use of about 2.0 standard drinks of  alcohol per week. She reports that she does not use drugs.  No Known Allergies Family History  Problem Relation Age of Onset  . Stroke Mother   . Cancer Mother   . Cancer Brother   . Mental illness Maternal Grandmother     Prior to Admission medications   Medication Sig Start Date End Date Taking? Authorizing Provider  acetaminophen (TYLENOL) 500 MG tablet Take 500 mg by mouth every 8 (eight) hours as needed for moderate pain.    [provider]  Calcium-Vit D-Arg-Inos-Silicon (BONE DENSITY) 300-200 MG-UNIT TABS Take 1 tablet by mouth 2 (two) times daily. Patient not taking: Reported on 04/15/2014 12/02/12   Carmelina Dane, MD  hydrOXYzine (ATARAX/VISTARIL) 25 MG tablet Take 1 tablet (25 mg total) by mouth every 6 (six) hours. 12/07/15   Deatra Canter, FNP  lansoprazole (PREVACID) 30 MG capsule Take 1 capsule (30 mg total) by mouth daily at 12 noon. Patient not taking: Reported on 04/15/2014 03/24/13   Collene Gobble, MD  lansoprazole (PREVACID) 30 MG capsule Take 1 capsule (30 mg total) by mouth daily at 12 noon. 06/22/14   Carmelina Dane, MD  meloxicam (MOBIC) 15 MG tablet Take 15 mg by mouth as needed for pain.    [provider]  meloxicam (MOBIC) 15 MG tablet Take 1 tablet (15 mg total) by mouth daily. 06/22/14   Carmelina Dane, MD  methylPREDNISolone (MEDROL DOSEPAK) 4 MG TBPK tablet Take 6-5-4-3-2-1 po qd 12/07/15   Deatra Canter, FNP  oxyCODONE-acetaminophen (PERCOCET) 5-325 MG per tablet Take 1-2 tablets by mouth every 6 (six) hours as needed for severe pain. Patient not taking: Reported on 04/15/2014 05/12/13   Piepenbrink, Victorino Dike, PA-C  sucralfate (CARAFATE) 1 G tablet Take 1 tablet (1 g total) by mouth 4 (four) times daily -  with meals and at bedtime. Patient not taking: Reported on 04/15/2014 03/24/13   Collene Gobble, MD  tiZANidine (ZANAFLEX) 4 MG tablet Take 4 mg by mouth every 6 (six) hours as needed for muscle spasms.    [provider]  zoster vaccine live, PF, (ZOSTAVAX) 16109 UNT/0.65ML injection Inject 19,400 Units into the skin once. Patient not taking: Reported on 04/15/2014 03/24/13   Collene Gobble, MD   Physical Exam: Blood pressure (!) 183/81, pulse 71, temperature 97.6 F (36.4 C), temperature source Oral, resp. rate 20, SpO2 97 %. Vitals:   01/10/20 1130 01/10/20 1137  BP:  (!) 183/81  Pulse: 64 71  Resp: 11 20  Temp:    SpO2: 96% 97%    General: 74 y.o. female resting in bed in NAD Eyes: PERRL, normal sclera ENMT: Nares patent w/o discharge, orophaynx clear, dentition normal, ears w/o discharge/lesions/ulcers Neck: Supple, trachea midline Cardiovascular: RRR, +S1, S2, no m/g/r, equal pulses throughout Respiratory: CTABL, no w/r/r, normal WOB GI: BS hypoactive, ND, globally tender and somewhat firm, no masses noted, no organomegaly noted MSK: No e/c/c; cachetic Skin: No rashes, bruises, ulcerations noted Neuro: A&O x 3, no focal deficits  Labs on Admission:  Basic Metabolic Panel: Recent Labs  Lab 01/10/20 0825  NA 140  K 4.8  CL 99  CO2 18*  GLUCOSE 62*  BUN 29*  CREATININE 1.74*  CALCIUM 9.4   Liver Function Tests: Recent Labs  Lab 01/10/20 0825  AST 29  ALT 8  ALKPHOS 74  BILITOT 1.0  PROT 6.9  ALBUMIN 3.9   Recent Labs  Lab 01/10/20 0825  LIPASE 32   No results for input(s): AMMONIA in the last 168 hours. CBC: Recent Labs  Lab 01/10/20 0825  WBC 6.0  NEUTROABS 5.1  HGB 15.3*  HCT 47.4*  MCV 93.5  PLT 286   Cardiac Enzymes: No results for input(s): CKTOTAL, CKMB, CKMBINDEX, TROPONINI in the last 168 hours. BNP: Invalid input(s): POCBNP CBG: No results for input(s): GLUCAP in the last 168 hours.  Radiological Exams on Admission: CT ABDOMEN PELVIS WO CONTRAST  Result Date: 01/10/2020 CLINICAL DATA:  Abdominal pain. EXAM: CT ABDOMEN AND PELVIS WITHOUT CONTRAST TECHNIQUE: Multidetector CT imaging of the abdomen and pelvis was performed following  the standard protocol without IV contrast. COMPARISON:  None. FINDINGS: Lower chest: No acute abnormality. Hepatobiliary: No focal liver abnormality is seen. No gallstones, gallbladder wall thickening, or biliary dilatation. Pancreas: Unremarkable. No pancreatic ductal dilatation or surrounding inflammatory changes. Spleen: Normal in size without focal abnormality. Adrenals/Urinary Tract: Adrenal glands are unremarkable. Kidneys are normal, without renal calculi, focal lesion, or hydronephrosis. Bladder is unremarkable. Stomach/Bowel: Evaluation of the bowel is markedly limited due to the paucity of intra-abdominal fat and oral contrast. The stomach and small bowel are nondistended and grossly unremarkable. Colonic diverticuli are identified, particularly distally. No convincing evidence of diverticulitis. The remainder of the colon is grossly unremarkable. The appendix is  best visualized on series 2, images 52 and 53, unremarkable in appearance and air-filled distally. Vascular/Lymphatic: Calcified atherosclerosis is seen in the tortuous nonaneurysmal aorta. No obvious adenopathy. Reproductive: Uterus and bilateral adnexa are unremarkable. Other: There is free throughout the abdomen. There is also a moderate amount of free fluid. Musculoskeletal: Mild AVN in the left femoral head. IMPRESSION: 1. Free air and free fluid in the abdomen and pelvis is identified. Findings are consistent with a bowel perforation but no source for the free air is otherwise seen. 2. Evaluation of the bowel is markedly limited due to the paucity of intra-abdominal fat and oral contrast. The appendix appears normal however. 3. Colonic diverticulosis without diverticulitis. 4. Calcified atherosclerosis in the tortuous nonaneurysmal aorta. 5. Mild AVN in the left femoral head. 6. Aortic atherosclerosis. Findings called to the patient's PA, Arthor Captain. Aortic Atherosclerosis (ICD10-I70.0). Electronically Signed   By: Gerome Sam III  M.D   On: 01/10/2020 11:09   Time spent: 35 minutes.  Teddy Spike DO [facility]  If 7PM-7AM, please contact night-coverage [email or web address] 01/10/2020, 11:56 AM

## 2020-01-10 NOTE — Anesthesia Postprocedure Evaluation (Signed)
Anesthesia Post Note  Patient: Tracy Barton  Procedure(s) Performed: LAPAROSCOPY DIAGNOSTIC laparoscopic washout omental patch splenectomy (N/A Abdomen) EXPLORATORY LAPAROTOMY (N/A )     Patient location during evaluation: PACU Anesthesia Type: General Level of consciousness: sedated and patient cooperative Pain management: pain level controlled Vital Signs Assessment: post-procedure vital signs reviewed and stable Respiratory status: spontaneous breathing Cardiovascular status: stable Anesthetic complications: no   No complications documented.  Last Vitals:  Vitals:   01/10/20 1616 01/10/20 1633  BP: (!) 167/106 (!) 171/82  Pulse: 83 83  Resp: (!) 24 10  Temp:    SpO2: 100% 100%    Last Pain:  Vitals:   01/10/20 1117  TempSrc:   PainSc: 10-Worst pain ever                 Lewie Loron

## 2020-01-11 ENCOUNTER — Inpatient Hospital Stay (HOSPITAL_COMMUNITY): Payer: Medicare Other

## 2020-01-11 ENCOUNTER — Encounter (HOSPITAL_COMMUNITY): Payer: Self-pay | Admitting: Surgery

## 2020-01-11 DIAGNOSIS — B182 Chronic viral hepatitis C: Secondary | ICD-10-CM

## 2020-01-11 DIAGNOSIS — N179 Acute kidney failure, unspecified: Secondary | ICD-10-CM | POA: Diagnosis not present

## 2020-01-11 DIAGNOSIS — Z9114 Patient's other noncompliance with medication regimen: Secondary | ICD-10-CM

## 2020-01-11 LAB — CBC
HCT: 23.9 % — ABNORMAL LOW (ref 36.0–46.0)
Hemoglobin: 7.8 g/dL — ABNORMAL LOW (ref 12.0–15.0)
MCH: 29.9 pg (ref 26.0–34.0)
MCHC: 32.6 g/dL (ref 30.0–36.0)
MCV: 91.6 fL (ref 80.0–100.0)
Platelets: 266 10*3/uL (ref 150–400)
RBC: 2.61 MIL/uL — ABNORMAL LOW (ref 3.87–5.11)
RDW: 12.9 % (ref 11.5–15.5)
WBC: 17 10*3/uL — ABNORMAL HIGH (ref 4.0–10.5)
nRBC: 0.1 % (ref 0.0–0.2)

## 2020-01-11 LAB — BASIC METABOLIC PANEL
Anion gap: 10 (ref 5–15)
BUN: 26 mg/dL — ABNORMAL HIGH (ref 8–23)
CO2: 25 mmol/L (ref 22–32)
Calcium: 8 mg/dL — ABNORMAL LOW (ref 8.9–10.3)
Chloride: 106 mmol/L (ref 98–111)
Creatinine, Ser: 1.56 mg/dL — ABNORMAL HIGH (ref 0.44–1.00)
GFR, Estimated: 34 mL/min — ABNORMAL LOW (ref >60)
Glucose, Bld: 173 mg/dL — ABNORMAL HIGH (ref 70–99)
Potassium: 4.1 mmol/L (ref 3.5–5.1)
Sodium: 141 mmol/L (ref 135–145)

## 2020-01-11 LAB — GLUCOSE, CAPILLARY
Glucose-Capillary: 57 mg/dL — ABNORMAL LOW (ref 70–99)
Glucose-Capillary: 76 mg/dL (ref 70–99)
Glucose-Capillary: 80 mg/dL (ref 70–99)
Glucose-Capillary: 97 mg/dL (ref 70–99)

## 2020-01-11 LAB — PREALBUMIN: Prealbumin: 9 mg/dL — ABNORMAL LOW (ref 18–38)

## 2020-01-11 LAB — MRSA PCR SCREENING: MRSA by PCR: NEGATIVE

## 2020-01-11 LAB — PHOSPHORUS: Phosphorus: 2.8 mg/dL (ref 2.5–4.6)

## 2020-01-11 LAB — LIPASE, BLOOD: Lipase: 25 U/L (ref 11–51)

## 2020-01-11 IMAGING — DX DG ABDOMEN 1V
1 series · 1 of 1 positions shown · non-contrast
Comparison: None.

CLINICAL DATA: NG tube placement

EXAM:
ABDOMEN - 1 VIEW

[abdomen kub]
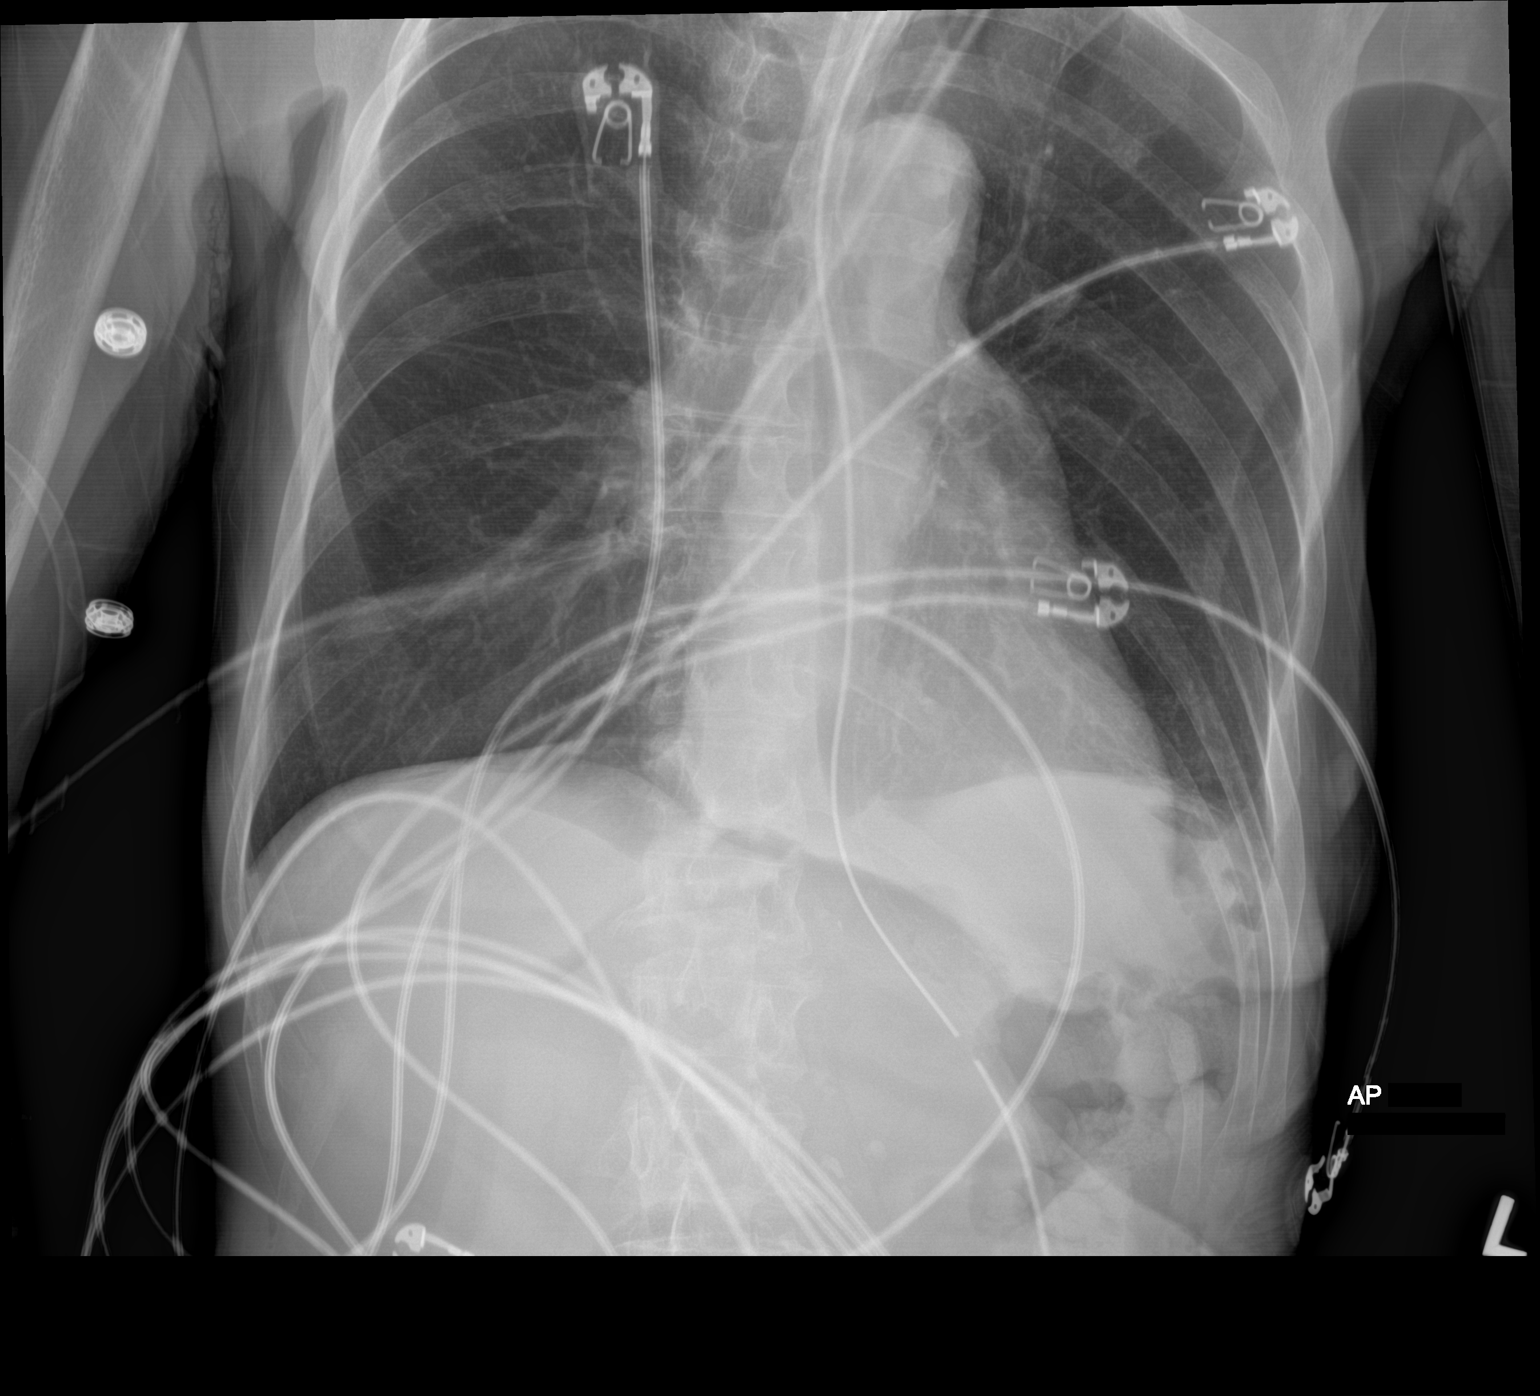

[1 of 1 positions shown; findings below may reference images not displayed]

FINDINGS: The NG tube terminates in the stomach.
IMPRESSION: NG tube terminates in the stomach.

## 2020-01-11 MED ORDER — LORAZEPAM 2 MG/ML IJ SOLN
1.0000 mg | INTRAMUSCULAR | Status: DC | PRN
Start: 2020-01-11 — End: 2020-01-13
  Administered 2020-01-11: 2 mg via INTRAVENOUS
  Administered 2020-01-11 – 2020-01-12 (×2): 1 mg via INTRAVENOUS
  Administered 2020-01-13 (×2): 2 mg via INTRAVENOUS
  Filled 2020-01-11 (×6): qty 1

## 2020-01-11 MED ORDER — THIAMINE HCL 100 MG PO TABS
100.0000 mg | ORAL_TABLET | Freq: Every day | ORAL | Status: DC
  Administered 2020-01-17 – 2020-01-21 (×5): 100 mg via ORAL
  Filled 2020-01-11 (×4): qty 1

## 2020-01-11 MED ORDER — THIAMINE HCL 100 MG/ML IJ SOLN
100.0000 mg | Freq: Every day | INTRAMUSCULAR | Status: DC
  Administered 2020-01-11 – 2020-01-16 (×6): 100 mg via INTRAVENOUS
  Filled 2020-01-11 (×6): qty 2

## 2020-01-11 MED ORDER — HALOPERIDOL LACTATE 5 MG/ML IJ SOLN
2.0000 mg | Freq: Four times a day (QID) | INTRAMUSCULAR | Status: DC | PRN
  Administered 2020-01-12 – 2020-01-16 (×2): 5 mg via INTRAVENOUS
  Administered 2020-01-16: 2 mg via INTRAVENOUS
  Administered 2020-01-17: 2.5 mg via INTRAVENOUS
  Administered 2020-01-18: 16:00:00 5 mg via INTRAVENOUS
  Filled 2020-01-11 (×5): qty 1

## 2020-01-11 MED ORDER — INSULIN ASPART 100 UNIT/ML ~~LOC~~ SOLN: 0.0000 [IU] | SUBCUTANEOUS | Status: DC

## 2020-01-11 MED ORDER — LORAZEPAM 1 MG PO TABS: 1.0000 mg | ORAL_TABLET | ORAL | Status: DC | PRN

## 2020-01-11 NOTE — Progress Notes (Signed)
PROGRESS NOTE    Tracy Barton  OJJ:[id number] DOB: 09/08/1945 DOA: 01/10/2020 PCP: Patient, No Pcp Per   Brief Narrative: Tracy Barton is a 75 y.o. female with a history of hepatitic C infection, alcohol abuse. Patient presented secondary to abdominal pain and found to have a perforated gastric ulcer requiring emergent surgery.   Assessment & Plan:   Principal Problem:   Perforated prepyloric gastric ulcer s/p omental [place] Ditto patch 01/10/2020 Active Problems:   Smoker   Gastritis and gastroduodenitis   Trigeminal neuralgia   Hep C w/o coma, chronic (HCC)   History of medication noncompliance   ARF (acute renal failure) (HCC)   S/P laparoscopic splenectomy   Perforated gastric ulcer Patient underwent laparoscopic surgery on 1/8 for which patient underwent splenectomy, washout/lysis of adhesions and omental patch -Per primary -Recommend decreasing narcotic dosing secondary to age/weight/Ativan for CIWA  Alcohol abuse Alcohol withdrawal -Continue CIWA -Continue thiamine, folate, MVI  AKI Baseline creatinine appears to be around 1.1. Creatinine of 1.74 on admission in setting of acute abdomen. On IV fluids. Creatinine of 1.56 today -Continue IV fluids  Hypoglycemia Mild. Given dextrose IV fluids. Currently NPO -Diet per primary -Continue CBG monitoring q 4 hours while NPO  Metabolic acidosis Elevated anion gap. Likely secondary to AKI. Resolved.  Nutrition -Per priamry  Leukocytosis Likely reactive and secondary to recent surgery. -Daily CBC  Acute blood loss anemia Likely secondary to recent surgery -CBC daily; transfuse as needed  Tobacco abuse -If patient desires, start nicotine patch   DVT prophylaxis: Per primary Code Status:   Code Status: Full Code Family Communication: None at bedside Disposition Plan: Per primary   Procedures:   LAPAROSCOPIC SURGERY  Antimicrobials:  Zosyn IV   Subjective: Patient is  altered.  Objective: Vitals:   01/11/20 0909 01/11/20 1000 01/11/20 1100 01/11/20 1253  BP:  132/67 112/64   Pulse:  80 67   Resp:  (!) 8 (!) 9   Temp: 97.8 F (36.6 C)   97.9 F (36.6 C)  TempSrc: Axillary   Axillary  SpO2:  93% 97%   Weight:      Height:        Intake/Output Summary (Last 24 hours) at 01/11/2020 1349 Last data filed at 01/11/2020 0526 Gross per 24 hour  Intake 3237.6 ml  Output 900 ml  Net 2337.6 ml   Filed Weights   01/10/20 1401 01/10/20 1901  Weight: 49 kg 38.7 kg    Examination:  General exam: Appears slightly agitated  Respiratory system: Clear to auscultation. Respiratory effort normal. Cardiovascular system: S1 & S2 heard, RRR. No murmurs, rubs, gallops or clicks. Gastrointestinal system: Abdomen is nondistended. No organomegaly or masses felt. Decreased bowel sounds heard. Central nervous system: Alert but not oriented. Musculoskeletal: No edema. No calf tenderness Skin: No cyanosis. No rashes Psychiatry: Judgement and insight impaired.    Data Reviewed: I have personally reviewed following labs and imaging studies  CBC Lab Results  Component Value Date   WBC 17.0 (H) 01/11/2020   RBC 2.61 (L) 01/11/2020   HGB 7.8 (L) 01/11/2020   HCT 23.9 (L) 01/11/2020   MCV 91.6 01/11/2020   MCH 29.9 01/11/2020   PLT 266 01/11/2020   MCHC 32.6 01/11/2020   RDW 12.9 01/11/2020   LYMPHSABS 0.7 01/10/2020   MONOABS 0.1 01/10/2020   EOSABS 0.0 01/10/2020   BASOSABS 0.0 01/10/2020     Last metabolic panel Lab Results  Component Value Date   NA 141 01/11/2020  K 4.1 01/11/2020   CL 106 01/11/2020   CO2 25 01/11/2020   BUN 26 (H) 01/11/2020   CREATININE 1.56 (H) 01/11/2020   GLUCOSE 173 (H) 01/11/2020   GFRNONAA 34 (L) 01/11/2020   GFRAA 54 (L) 03/08/2019   CALCIUM 8.0 (L) 01/11/2020   PHOS 2.8 01/11/2020   PROT 6.9 01/10/2020   ALBUMIN 3.9 01/10/2020   BILITOT 1.0 01/10/2020   ALKPHOS 74 01/10/2020   AST 29 01/10/2020   ALT 8  01/10/2020   ANIONGAP 10 01/11/2020    CBG (last 3)  Recent Labs    01/11/20 1049  GLUCAP 97     GFR: Estimated Creatinine Clearance: 19 mL/min (A) (by C-G formula based on SCr of 1.56 mg/dL (H)).  Coagulation Profile: No results for input(s): INR, PROTIME in the last 168 hours.  Recent Results (from the past 240 hour(s))  Blood culture (routine x 2)     Status: None (Preliminary result)   Collection Time: 01/10/20  9:33 AM   Specimen: BLOOD  Result Value Ref Range Status   Specimen Description   Final    BLOOD RIGHT ANTECUBITAL Performed at [facility], [address] [place], [place] [address]    Special Requests   Final    BOTTLES DRAWN AEROBIC AND ANAEROBIC Blood Culture results may not be optimal due to an inadequate volume of blood received in culture bottles Performed at [facility], [address], [place], [place] [address]    Culture   Final    NO GROWTH < 24 HOURS Performed at [facility], [address], [place], [place] [address]    Report Status PENDING  Incomplete  Blood culture (routine x 2)     Status: None (Preliminary result)   Collection Time: 01/10/20 10:09 AM   Specimen: BLOOD RIGHT FOREARM  Result Value Ref Range Status   Specimen Description   Final    BLOOD RIGHT FOREARM Performed at [facility], [address] [place], [place] [address]    Special Requests   Final    BOTTLES DRAWN AEROBIC AND ANAEROBIC Blood Culture results may not be optimal due to an inadequate volume of blood received in culture bottles Performed at [facility], [address], [place], [place] [address]    Culture   Final    NO GROWTH < 24 HOURS Performed at [facility], [address], [place], [place] [address]    Report Status PENDING  Incomplete  Resp Panel by RT-PCR (Flu A&B, Covid) Nasopharyngeal Swab     Status: None   Collection Time: 01/10/20 11:09 AM   Specimen:  Nasopharyngeal Swab; Nasopharyngeal(NP) swabs in vial transport medium  Result Value Ref Range Status   SARS Coronavirus 2 by RT PCR NEGATIVE NEGATIVE Final    Comment: (NOTE) SARS-CoV-2 target nucleic acids are NOT DETECTED.  The SARS-CoV-2 RNA is generally detectable in upper respiratory specimens during the acute phase of infection. The lowest concentration of SARS-CoV-2 viral copies this assay can detect is 138 copies/mL. A negative result does not preclude SARS-Cov-2 infection and should not be used as the sole basis for treatment or other patient management decisions. A negative result may occur with  improper specimen collection/handling, submission of specimen other than nasopharyngeal swab, presence of viral mutation(s) within the areas targeted by this assay, and inadequate number of viral copies(<138 copies/mL). A negative result must be combined with clinical observations, patient history, and epidemiological  information. The expected result is Negative.  Fact Sheet for Patients:  BloggerCourse.com  Fact Sheet for Healthcare Providers:  SeriousBroker.it  This test is no t yet approved or cleared by the [place] FDA and  has been authorized for detection and/or diagnosis of SARS-CoV-2 by FDA under an Emergency Use Authorization (EUA). This EUA will remain  in effect (meaning this test can be used) for the duration of the COVID-19 declaration under Section 564(b)(1) of the Act, 21 U.S.C.section 360bbb-3(b)(1), unless the authorization is terminated  or revoked sooner.       Influenza A by PCR NEGATIVE NEGATIVE Final   Influenza B by PCR NEGATIVE NEGATIVE Final    Comment: (NOTE) The Xpert Xpress SARS-CoV-2/FLU/RSV plus assay is intended as an aid in the diagnosis of influenza from Nasopharyngeal swab specimens and should not be used as a sole basis for treatment. Nasal washings and aspirates are unacceptable for  Xpert Xpress SARS-CoV-2/FLU/RSV testing.  Fact Sheet for Patients: BloggerCourse.com  Fact Sheet for Healthcare Providers: SeriousBroker.it  This test is not yet approved or cleared by the [place] FDA and has been authorized for detection and/or diagnosis of SARS-CoV-2 by FDA under an Emergency Use Authorization (EUA). This EUA will remain in effect (meaning this test can be used) for the duration of the COVID-19 declaration under Section 564(b)(1) of the Act, 21 U.S.C. section 360bbb-3(b)(1), unless the authorization is terminated or revoked.  Performed at [facility], [address], [place], [place] [address]   MRSA PCR Screening     Status: None   Collection Time: 01/10/20 10:30 PM   Specimen: Nasal Mucosa; Nasopharyngeal  Result Value Ref Range Status   MRSA by PCR NEGATIVE NEGATIVE Final    Comment:        The GeneXpert MRSA Assay (FDA approved for NASAL specimens only), is one component of a comprehensive MRSA colonization surveillance program. It is not intended to diagnose MRSA infection nor to guide or monitor treatment for MRSA infections. Performed at [facility], [address], [place], [place] [address]         Radiology Studies: CT ABDOMEN PELVIS WO CONTRAST  Result Date: 01/10/2020 CLINICAL DATA:  Abdominal pain. EXAM: CT ABDOMEN AND PELVIS WITHOUT CONTRAST TECHNIQUE: Multidetector CT imaging of the abdomen and pelvis was performed following the standard protocol without IV contrast. COMPARISON:  None. FINDINGS: Lower chest: No acute abnormality. Hepatobiliary: No focal liver abnormality is seen. No gallstones, gallbladder wall thickening, or biliary dilatation. Pancreas: Unremarkable. No pancreatic ductal dilatation or surrounding inflammatory changes. Spleen: Normal in size without focal abnormality. Adrenals/Urinary Tract: Adrenal glands are unremarkable.  Kidneys are normal, without renal calculi, focal lesion, or hydronephrosis. Bladder is unremarkable. Stomach/Bowel: Evaluation of the bowel is markedly limited due to the paucity of intra-abdominal fat and oral contrast. The stomach and small bowel are nondistended and grossly unremarkable. Colonic diverticuli are identified, particularly distally. No convincing evidence of diverticulitis. The remainder of the colon is grossly unremarkable. The appendix is best visualized on series 2, images 52 and 53, unremarkable in appearance and air-filled distally. Vascular/Lymphatic: Calcified atherosclerosis is seen in the tortuous nonaneurysmal aorta. No obvious adenopathy. Reproductive: Uterus and bilateral adnexa are unremarkable. Other: There is free throughout the abdomen. There is also a moderate amount of free fluid. Musculoskeletal: Mild AVN in the left femoral head. IMPRESSION: 1. Free air and free fluid in the abdomen and pelvis is identified. Findings are consistent with a bowel perforation but no source  for the free air is otherwise seen. 2. Evaluation of the bowel is markedly limited due to the paucity of intra-abdominal fat and oral contrast. The appendix appears normal however. 3. Colonic diverticulosis without diverticulitis. 4. Calcified atherosclerosis in the tortuous nonaneurysmal aorta. 5. Mild AVN in the left femoral head. 6. Aortic atherosclerosis. Findings called to the patient's PA, Arthor CaptainAbigail Harris. Aortic Atherosclerosis (ICD10-I70.0). Electronically Signed   By: Gerome Samavid  Williams III M.D   On: 01/10/2020 11:09        Scheduled Meds: . sodium chloride   Intravenous Once  . Chlorhexidine Gluconate Cloth  6 each Topical Daily  . heparin  5,000 Units Subcutaneous Q8H  . influenza vaccine adjuvanted  0.5 mL Intramuscular Tomorrow-1000  . insulin aspart  0-15 Units Subcutaneous Q4H  . lip balm  1 application Topical BID  . mouth rinse  15 mL Mouth Rinse BID  . meningococcal oligosaccharide  0.5  mL Intramuscular Once  . metoprolol tartrate  5 mg Intravenous Q6H  . [START ON 01/14/2020] pantoprazole  40 mg Intravenous Q12H  . thiamine  100 mg Oral Daily   Or  . thiamine  100 mg Intravenous Daily   Continuous Infusions: . sodium chloride    . lactated ringers    . lactated ringers 75 mL/hr at 01/11/20 0524  . methocarbamol (ROBAXIN) IV    . ondansetron (ZOFRAN) IV    . pantoprozole (PROTONIX) infusion 8 mg/hr (01/11/20 0723)  . piperacillin-tazobactam (ZOSYN)  IV 2.25 g (01/11/20 1230)     LOS: 1 day     Jacquelin Hawkingalph Zandria Woldt, MD [facility] 01/11/2020, 1:49 PM  If 7PM-7AM, please contact night-coverage [email or web address]

## 2020-01-11 NOTE — Progress Notes (Signed)
Tracy Barton [id number] [birth year]  CARE TEAM:  PCP: Patient, No Pcp Per  Outpatient Care Team: Patient Care Team: Patient, No Pcp Per as PCP - General (General Practice)  Inpatient Treatment Team: Treatment Team: Attending Provider: Bishop Limbo, MD; Consulting Physician: Montez Morita, Md, MD; Registered Nurse: Murvin Natal, RN; Rounding Team: Alton Revere, MD; Utilization Review: Jon Billings, RN; Registered Nurse: Lowella Bandy, RN   Problem List:   Principal Problem:   Perforated prepyloric gastric ulcer s/p omental Cheree Ditto patch 01/10/2020 Active Problems:   Smoker   Gastritis and gastroduodenitis   Trigeminal neuralgia   Hep C w/o coma, chronic (HCC)   History of medication noncompliance   ARF (acute renal failure) (HCC)   S/P laparoscopic splenectomy   1 Day Post-Op  01/10/2020  POST-OPERATIVE DIAGNOSIS:  PERFORATED PREPYLORIC GASTRIC ULCER  PROCEDURE:   LAPAROSCOPIC OMENTAL GRAHAM PATCH OF ULCER LAPAROSCOPIC SPLENECTOMY LAPAROSCOPIC WASHOUT/LYSIS OF ADHESIONS  SURGEON:  Ardeth Sportsman, MD   Assessment  Hemodynamically improving  More confusion and delirium.  Suspicious for alcohol withdrawal  [facility] Stay = 1 days)  Plan:  -CIWA protocol with restraints as needed -IV PPI for perforated ulcer -Follow hemoglobin status post splenectomy. -Patient require immunizations postsplenectomy.  Those are written for. -Hyperglycemia.  Start sliding scale insulin -Elevated creatinine consistent with persistent acute kidney injury/renal failure.  Gently rehydrate and follow closely -VTE prophylaxis- SCDs, etc -mobilize as tolerated to help recovery  Disposition:  Disposition:  The patient is from: Home  Anticipate discharge to:  Skilled Nursing Facility  Anticipated Date of Discharge is:  January 17, 2020  Barriers to discharge:  Pending Clinical improvement (more likely than not  Patient currently is NOT MEDICALLY STABLE for discharge from the  hospital from a surgery standpoint.   30 minutes spent in review, evaluation, examination, counseling, and coordination of care.  More than 50% of that time was spent in counseling.  01/11/2020    Subjective: (Chief complaint)  Patiently increasingly restless and confused.  Wanting to take her morning pills.  Wonder where her phone is.  ICU nursing in room.  Patient denies much abdominal pain.  Objective:  Vital signs:  Vitals:   01/11/20 0200 01/11/20 0300 01/11/20 0400 01/11/20 0500  BP: (!) 140/55 (!) 160/61 (!) 148/65 (!) 124/51  Pulse: (!) 49 (!) 53  71  Resp: (!) 8 (!) 8 13 15   Temp:   97.8 F (36.6 C)   TempSrc:   Oral   SpO2: 100% 100%  100%  Weight:      Height:           Intake/Output   Yesterday:  01/08 0701 - 01/09 0700 In: 3337.6 [I.V.:2700; IV Piggyback:637.6] Out: 1050 [Urine:450; Emesis/NG output:100; Blood:500] This shift:  No intake/output data recorded.  Bowel function:  Flatus: YES  BM:  No  Drain: Bilious   Physical Exam:  General: Pt awake/alert in mild acute distress Eyes: PERRL, normal EOM.  Sclera clear.  No icterus Neuro: CN II-XII intact w/o focal sensory/motor deficits. Lymph: No head/neck/groin lymphadenopathy Psych: Sitting up in bed moving and twisting around.  Agitated and annoyed.  Somewhat consolable.  Oriented x 2 HENT: Normocephalic, Mucus membranes moist.  No thrush Neck: Supple, No tracheal deviation.  No obvious thyromegaly Chest: No pain to chest wall compression.  Good respiratory excursion.  No audible wheezing CV:  Pulses intact.  Regular rhythm.  No major extremity edema MS: Normal AROM mjr joints.  No obvious deformity  Abdomen: Soft.  Mildy distended.  Mildly tender at incisions only.  No evidence of peritonitis.  No incarcerated hernias.  Ext:   No deformity.  No mjr edema.  No cyanosis Skin: No petechiae / purpurea.  No major sores.  Warm and dry    Results:   Cultures: Recent Results (from the  past 720 hour(s))  Blood culture (routine x 2)     Status: None (Preliminary result)   Collection Time: 01/10/20 10:09 AM   Specimen: BLOOD RIGHT FOREARM  Result Value Ref Range Status   Specimen Description   Final    BLOOD RIGHT FOREARM Performed at [facility], [address] [place], [place] [address]    Special Requests   Final    BOTTLES DRAWN AEROBIC AND ANAEROBIC Blood Culture results may not be optimal due to an inadequate volume of blood received in culture bottles Performed at [facility], [address] [place], [place] [address]    Culture PENDING  Incomplete   Report Status PENDING  Incomplete  Resp Panel by RT-PCR (Flu A&B, Covid) Nasopharyngeal Swab     Status: None   Collection Time: 01/10/20 11:09 AM   Specimen: Nasopharyngeal Swab; Nasopharyngeal(NP) swabs in vial transport medium  Result Value Ref Range Status   SARS Coronavirus 2 by RT PCR NEGATIVE NEGATIVE Final    Comment: (NOTE) SARS-CoV-2 target nucleic acids are NOT DETECTED.  The SARS-CoV-2 RNA is generally detectable in upper respiratory specimens during the acute phase of infection. The lowest concentration of SARS-CoV-2 viral copies this assay can detect is 138 copies/mL. A negative result does not preclude SARS-Cov-2 infection and should not be used as the sole basis for treatment or other patient management decisions. A negative result may occur with  improper specimen collection/handling, submission of specimen other than nasopharyngeal swab, presence of viral mutation(s) within the areas targeted by this assay, and inadequate number of viral copies(<138 copies/mL). A negative result must be combined with clinical observations, patient history, and epidemiological information. The expected result is Negative.  Fact Sheet for Patients:  BloggerCourse.comhttps://www.fda.gov/media/152166/download  Fact Sheet for Healthcare Providers:  SeriousBroker.ithttps://www.fda.gov/media/152162/download  This  test is no t yet approved or cleared by the [place] FDA and  has been authorized for detection and/or diagnosis of SARS-CoV-2 by FDA under an Emergency Use Authorization (EUA). This EUA will remain  in effect (meaning this test can be used) for the duration of the COVID-19 declaration under Section 564(b)(1) of the Act, 21 U.S.C.section 360bbb-3(b)(1), unless the authorization is terminated  or revoked sooner.       Influenza A by PCR NEGATIVE NEGATIVE Final   Influenza B by PCR NEGATIVE NEGATIVE Final    Comment: (NOTE) The Xpert Xpress SARS-CoV-2/FLU/RSV plus assay is intended as an aid in the diagnosis of influenza from Nasopharyngeal swab specimens and should not be used as a sole basis for treatment. Nasal washings and aspirates are unacceptable for Xpert Xpress SARS-CoV-2/FLU/RSV testing.  Fact Sheet for Patients: BloggerCourse.comhttps://www.fda.gov/media/152166/download  Fact Sheet for Healthcare Providers: SeriousBroker.ithttps://www.fda.gov/media/152162/download  This test is not yet approved or cleared by the [place] FDA and has been authorized for detection and/or diagnosis of SARS-CoV-2 by FDA under an Emergency Use Authorization (EUA). This EUA will remain in effect (meaning this test can be used) for the duration of the COVID-19 declaration under Section 564(b)(1) of the Act, 21 U.S.C. section 360bbb-3(b)(1), unless the authorization is terminated or revoked.  Performed at [facility], [address], [place], [place] [address]   MRSA PCR Screening     Status: None   Collection Time: 01/10/20 10:30 PM   Specimen: Nasal Mucosa; Nasopharyngeal  Result Value Ref Range Status   MRSA by PCR NEGATIVE NEGATIVE Final    Comment:        The GeneXpert MRSA Assay (FDA approved for NASAL specimens only), is one component of a comprehensive MRSA colonization surveillance program. It is not intended to diagnose MRSA infection nor to guide or monitor treatment  for MRSA infections. Performed at [facility], [address] [place], [place] [address]     Labs: Results for orders placed or performed during the hospital encounter of 01/10/20 (from the past 48 hour(s))  Comprehensive metabolic panel     Status: Abnormal   Collection Time: 01/10/20  8:25 AM  Result Value Ref Range   Sodium 140 135 - 145 mmol/L    Comment: REPEATED TO VERIFY   Potassium 4.8 3.5 - 5.1 mmol/L    Comment: MODERATE HEMOLYSIS   Chloride 99 98 - 111 mmol/L    Comment: REPEATED TO VERIFY   CO2 18 (L) 22 - 32 mmol/L    Comment: REPEATED TO VERIFY   Glucose, Bld 62 (L) 70 - 99 mg/dL    Comment: Glucose reference range applies only to samples taken after fasting for at least 8 hours.   BUN 29 (H) 8 - 23 mg/dL   Creatinine, Ser 0.98 (H) 0.44 - 1.00 mg/dL   Calcium 9.4 8.9 - 11.9 mg/dL   Total Protein 6.9 6.5 - 8.1 g/dL   Albumin 3.9 3.5 - 5.0 g/dL   AST 29 15 - 41 U/L   ALT 8 0 - 44 U/L   Alkaline Phosphatase 74 38 - 126 U/L   Total Bilirubin 1.0 0.3 - 1.2 mg/dL   GFR, Estimated 30 (L) >60 mL/min    Comment: (NOTE) Calculated using the CKD-EPI Creatinine Equation (2021)    Anion gap 23 (H) 5 - 15    Comment: REPEATED TO VERIFY Performed at [facility], [address] [place], [place] [address]   Lipase, blood     Status: None   Collection Time: 01/10/20  8:25 AM  Result Value Ref Range   Lipase 32 11 - 51 U/L    Comment: Performed at [facility], [address], [place], [place] [address]  CBC with Differential     Status: Abnormal   Collection Time: 01/10/20  8:25 AM  Result Value Ref Range   WBC 6.0 4.0 - 10.5 K/uL   RBC 5.07 3.87 - 5.11 MIL/uL   Hemoglobin 15.3 (H) 12.0 - 15.0 g/dL   HCT 30.8 (H) 65.7 - 84.6 %   MCV 93.5 80.0 - 100.0 fL   MCH 30.2 26.0 - 34.0 pg   MCHC 32.3 30.0 - 36.0 g/dL   RDW 96.2 95.2 - 84.1 %   Platelets 286 150 - 400 K/uL   nRBC 0.0 0.0 - 0.2 %   Neutrophils  Relative % 86 %   Neutro Abs 5.1 1.7 - 7.7 K/uL   Lymphocytes Relative 12 %   Lymphs Abs 0.7 0.7 - 4.0 K/uL   Monocytes Relative 2 %   Monocytes Absolute 0.1 0.1 - 1.0 K/uL   Eosinophils Relative 0 %   Eosinophils Absolute 0.0 0.0 - 0.5 K/uL   Basophils Relative 0 %   Basophils Absolute 0.0 0.0 - 0.1 K/uL   Immature  Granulocytes 0 %   Abs Immature Granulocytes 0.02 0.00 - 0.07 K/uL    Comment: Performed at [facility], [address], [place], [place] [address]  Lactic acid, plasma     Status: Abnormal   Collection Time: 01/10/20 10:08 AM  Result Value Ref Range   Lactic Acid, Venous 3.1 (HH) 0.5 - 1.9 mmol/L    Comment: CRITICAL RESULT CALLED TO, READ BACK BY AND VERIFIED WITHSallye Ober RN AT 1104 01/10/20 MULLINS,T Performed at [facility], [address], [place], [place] [address]   Ethanol     Status: None   Collection Time: 01/10/20 10:08 AM  Result Value Ref Range   Alcohol, Ethyl (B) <10 <10 mg/dL    Comment: (NOTE) Lowest detectable limit for serum alcohol is 10 mg/dL.  For medical purposes only. Performed at [facility], [address], [place], [place] [address]   Blood culture (routine x 2)     Status: None (Preliminary result)   Collection Time: 01/10/20 10:09 AM   Specimen: BLOOD RIGHT FOREARM  Result Value Ref Range   Specimen Description      BLOOD RIGHT FOREARM Performed at [facility], [address] [place], [place] [address]    Special Requests      BOTTLES DRAWN AEROBIC AND ANAEROBIC Blood Culture results may not be optimal due to an inadequate volume of blood received in culture bottles Performed at [facility], [address], [place], [place] [address]    Culture PENDING    Report Status PENDING   Resp Panel by RT-PCR (Flu A&B, Covid) Nasopharyngeal Swab     Status: None   Collection Time: 01/10/20 11:09 AM   Specimen: Nasopharyngeal Swab; Nasopharyngeal(NP)  swabs in vial transport medium  Result Value Ref Range   SARS Coronavirus 2 by RT PCR NEGATIVE NEGATIVE    Comment: (NOTE) SARS-CoV-2 target nucleic acids are NOT DETECTED.  The SARS-CoV-2 RNA is generally detectable in upper respiratory specimens during the acute phase of infection. The lowest concentration of SARS-CoV-2 viral copies this assay can detect is 138 copies/mL. A negative result does not preclude SARS-Cov-2 infection and should not be used as the sole basis for treatment or other patient management decisions. A negative result may occur with  improper specimen collection/handling, submission of specimen other than nasopharyngeal swab, presence of viral mutation(s) within the areas targeted by this assay, and inadequate number of viral copies(<138 copies/mL). A negative result must be combined with clinical observations, patient history, and epidemiological information. The expected result is Negative.  Fact Sheet for Patients:  BloggerCourse.com  Fact Sheet for Healthcare Providers:  SeriousBroker.it  This test is no t yet approved or cleared by the [place] FDA and  has been authorized for detection and/or diagnosis of SARS-CoV-2 by FDA under an Emergency Use Authorization (EUA). This EUA will remain  in effect (meaning this test can be used) for the duration of the COVID-19 declaration under Section 564(b)(1) of the Act, 21 U.S.C.section 360bbb-3(b)(1), unless the authorization is terminated  or revoked sooner.       Influenza A by PCR NEGATIVE NEGATIVE   Influenza B by PCR NEGATIVE NEGATIVE    Comment: (NOTE) The Xpert Xpress SARS-CoV-2/FLU/RSV plus assay is intended as an aid in the diagnosis of influenza from Nasopharyngeal swab specimens and should not be used as a sole basis for treatment. Nasal washings and aspirates are unacceptable for Xpert Xpress SARS-CoV-2/FLU/RSV testing.  Fact  Sheet for  Patients: BloggerCourse.com  Fact Sheet for Healthcare Providers: SeriousBroker.it  This test is not yet approved or cleared by the [place] FDA and has been authorized for detection and/or diagnosis of SARS-CoV-2 by FDA under an Emergency Use Authorization (EUA). This EUA will remain in effect (meaning this test can be used) for the duration of the COVID-19 declaration under Section 564(b)(1) of the Act, 21 U.S.C. section 360bbb-3(b)(1), unless the authorization is terminated or revoked.  Performed at [facility], [address], [place], [place] [address]   Urinalysis, Routine w reflex microscopic Urine, Clean Catch     Status: Abnormal   Collection Time: 01/10/20 12:30 PM  Result Value Ref Range   Color, Urine YELLOW YELLOW   APPearance HAZY (A) CLEAR   Specific Gravity, Urine 1.024 1.005 - 1.030   pH 5.0 5.0 - 8.0   Glucose, UA NEGATIVE NEGATIVE mg/dL   Hgb urine dipstick SMALL (A) NEGATIVE   Bilirubin Urine NEGATIVE NEGATIVE   Ketones, ur 20 (A) NEGATIVE mg/dL   Protein, ur 30 (A) NEGATIVE mg/dL   Nitrite NEGATIVE NEGATIVE   Leukocytes,Ua LARGE (A) NEGATIVE   RBC / HPF 11-20 0 - 5 RBC/hpf   WBC, UA >50 (H) 0 - 5 WBC/hpf   Bacteria, UA RARE (A) NONE SEEN   Squamous Epithelial / LPF 0-5 0 - 5   Mucus PRESENT    Hyaline Casts, UA PRESENT     Comment: Performed at [facility], [address] [place], [place] [address]  ABO/Rh     Status: None   Collection Time: 01/10/20  1:13 PM  Result Value Ref Range   ABO/RH(D)      B POS Performed at [facility], [address], [place], [place] [address]   Type and screen [facility]     Status: None   Collection Time: 01/10/20  1:15 PM  Result Value Ref Range   ABO/RH(D) B POS    Antibody Screen NEG    Sample Expiration      01/13/2020,2359 Performed at [facility], [address], [place], [place] [address]   MRSA PCR Screening     Status: None   Collection Time: 01/10/20 10:30 PM   Specimen: Nasal Mucosa; Nasopharyngeal  Result Value Ref Range   MRSA by PCR NEGATIVE NEGATIVE    Comment:        The GeneXpert MRSA Assay (FDA approved for NASAL specimens only), is one component of a comprehensive MRSA colonization surveillance program. It is not intended to diagnose MRSA infection nor to guide or monitor treatment for MRSA infections. Performed at [facility], [address], [place], [place] [address]   Basic metabolic panel     Status: Abnormal   Collection Time: 01/11/20  2:36 AM  Result Value Ref Range   Sodium 141 135 - 145 mmol/L   Potassium 4.1 3.5 - 5.1 mmol/L   Chloride 106 98 - 111 mmol/L   CO2 25 22 - 32 mmol/L   Glucose, Bld 173 (H) 70 - 99 mg/dL    Comment: Glucose reference range applies only to samples taken after fasting for at least 8 hours.   BUN 26 (H) 8 - 23 mg/dL   Creatinine, Ser 3.15 (H) 0.44 - 1.00 mg/dL   Calcium 8.0 (L) 8.9 - 10.3 mg/dL   GFR, Estimated 34 (L) >60 mL/min    Comment: (NOTE) Calculated using the CKD-EPI  Creatinine Equation (2021)    Anion gap 10 5 - 15    Comment: Performed at [facility], [address] [place], [place] [address]  Lipase, blood     Status: None   Collection Time: 01/11/20  2:36 AM  Result Value Ref Range   Lipase 25 11 - 51 U/L    Comment: Performed at [facility], [address] [place], [place] [address]    Imaging / Studies: CT ABDOMEN PELVIS WO CONTRAST  Result Date: 01/10/2020 CLINICAL DATA:  Abdominal pain. EXAM: CT ABDOMEN AND PELVIS WITHOUT CONTRAST TECHNIQUE: Multidetector CT imaging of the abdomen and pelvis was performed following the standard protocol without IV contrast. COMPARISON:  None. FINDINGS: Lower chest: No acute abnormality. Hepatobiliary: No focal liver abnormality is seen. No gallstones,  gallbladder wall thickening, or biliary dilatation. Pancreas: Unremarkable. No pancreatic ductal dilatation or surrounding inflammatory changes. Spleen: Normal in size without focal abnormality. Adrenals/Urinary Tract: Adrenal glands are unremarkable. Kidneys are normal, without renal calculi, focal lesion, or hydronephrosis. Bladder is unremarkable. Stomach/Bowel: Evaluation of the bowel is markedly limited due to the paucity of intra-abdominal fat and oral contrast. The stomach and small bowel are nondistended and grossly unremarkable. Colonic diverticuli are identified, particularly distally. No convincing evidence of diverticulitis. The remainder of the colon is grossly unremarkable. The appendix is best visualized on series 2, images 52 and 53, unremarkable in appearance and air-filled distally. Vascular/Lymphatic: Calcified atherosclerosis is seen in the tortuous nonaneurysmal aorta. No obvious adenopathy. Reproductive: Uterus and bilateral adnexa are unremarkable. Other: There is free throughout the abdomen. There is also a moderate amount of free fluid. Musculoskeletal: Mild AVN in the left femoral head. IMPRESSION: 1. Free air and free fluid in the abdomen and pelvis is identified. Findings are consistent with a bowel perforation but no source for the free air is otherwise seen. 2. Evaluation of the bowel is markedly limited due to the paucity of intra-abdominal fat and oral contrast. The appendix appears normal however. 3. Colonic diverticulosis without diverticulitis. 4. Calcified atherosclerosis in the tortuous nonaneurysmal aorta. 5. Mild AVN in the left femoral head. 6. Aortic atherosclerosis. Findings called to the patient's PA, Arthor CaptainAbigail Harris. Aortic Atherosclerosis (ICD10-I70.0). Electronically Signed   By: Gerome Samavid  Williams III M.D   On: 01/10/2020 11:09    Medications / Allergies: per chart  Antibiotics: Anti-infectives (From admission, onward)   Start     Dose/Rate Route Frequency Ordered  Stop   01/11/20 0000  piperacillin-tazobactam (ZOSYN) IVPB 2.25 g        2.25 g 100 mL/hr over 30 Minutes Intravenous Every 6 hours 01/10/20 2139     01/10/20 2215  piperacillin-tazobactam (ZOSYN) IVPB 3.375 g  Status:  Discontinued        3.375 g 12.5 mL/hr over 240 Minutes Intravenous Every 8 hours 01/10/20 2122 01/10/20 2139   01/10/20 1200  cefoTEtan (CEFOTAN) 2 g in sodium chloride 0.9 % 100 mL IVPB        2 g 200 mL/hr over 30 Minutes Intravenous To ShortStay Surgical 01/10/20 1133 01/10/20 1333   01/10/20 1115  piperacillin-tazobactam (ZOSYN) IVPB 3.375 g        3.375 g 100 mL/hr over 30 Minutes Intravenous  Once 01/10/20 1106 01/10/20 1200        Note: Portions of this report may have been transcribed using voice recognition software. Every effort was made to ensure accuracy; however, inadvertent computerized transcription errors may be present.   Any transcriptional errors  that result from this process are unintentional.    Ardeth Sportsman, MD, FACS, MASCRS Gastrointestinal and Minimally Invasive Surgery  [facility] [address] [place], [place] [address] [phone] Fax [phone] Main/Paging  CONTACT INFORMATION: Weekday (9AM-5PM) concerns: Call [facility] main office at [phone] Weeknight (5PM-9AM) or Weekend/Holiday concerns: Check [email or web address] for General Surgery [facility] coverage (Please, do not use SecureChat as it is not reliable communication to operating surgeons for immediate patient care)      01/11/2020  8:09 AM

## 2020-01-11 NOTE — Progress Notes (Signed)
This RN spoke with daughter Cherly Hensen and explained soft wrist restraints and soft ankle restraints. All questions encouraged and answered. Daughter expressed understanding of situation. She expressed that she "knew this was going to happen" and that "she gets confused like that".

## 2020-01-11 NOTE — Progress Notes (Signed)
This RN attempted to notify next of kin, Ronnette Juniper, daughter listed in chart with no answer. Patient has been placed in bilateral soft wrist restraints and bilateral soft ankle restraints for removal of devices, removal of dressings and interference with medical care. Patient is alert, oriented to person and place. Disoriented to time and situation. When further questioning was prompted patient responded "you ain't about to quiz me".

## 2020-01-12 ENCOUNTER — Encounter (HOSPITAL_COMMUNITY): Payer: Self-pay

## 2020-01-12 DIAGNOSIS — Z9114 Patient's other noncompliance with medication regimen: Secondary | ICD-10-CM | POA: Diagnosis not present

## 2020-01-12 DIAGNOSIS — B182 Chronic viral hepatitis C: Secondary | ICD-10-CM | POA: Diagnosis not present

## 2020-01-12 DIAGNOSIS — N179 Acute kidney failure, unspecified: Secondary | ICD-10-CM | POA: Diagnosis not present

## 2020-01-12 LAB — CBC
HCT: 24.6 % — ABNORMAL LOW (ref 36.0–46.0)
Hemoglobin: 8 g/dL — ABNORMAL LOW (ref 12.0–15.0)
MCH: 30.5 pg (ref 26.0–34.0)
MCHC: 32.5 g/dL (ref 30.0–36.0)
MCV: 93.9 fL (ref 80.0–100.0)
Platelets: 248 10*3/uL (ref 150–400)
RBC: 2.62 MIL/uL — ABNORMAL LOW (ref 3.87–5.11)
RDW: 13.2 % (ref 11.5–15.5)
WBC: 20.3 10*3/uL — ABNORMAL HIGH (ref 4.0–10.5)
nRBC: 0 % (ref 0.0–0.2)

## 2020-01-12 LAB — HEMOGLOBIN A1C
Hgb A1c MFr Bld: 5 % (ref 4.8–5.6)
Mean Plasma Glucose: 96.8 mg/dL

## 2020-01-12 LAB — BASIC METABOLIC PANEL
Anion gap: 10 (ref 5–15)
BUN: 24 mg/dL — ABNORMAL HIGH (ref 8–23)
CO2: 26 mmol/L (ref 22–32)
Calcium: 8.3 mg/dL — ABNORMAL LOW (ref 8.9–10.3)
Chloride: 105 mmol/L (ref 98–111)
Creatinine, Ser: 1.15 mg/dL — ABNORMAL HIGH (ref 0.44–1.00)
GFR, Estimated: 50 mL/min — ABNORMAL LOW (ref >60)
Glucose, Bld: 132 mg/dL — ABNORMAL HIGH (ref 70–99)
Potassium: 3.8 mmol/L (ref 3.5–5.1)
Sodium: 141 mmol/L (ref 135–145)

## 2020-01-12 LAB — GLUCOSE, CAPILLARY
Glucose-Capillary: 120 mg/dL — ABNORMAL HIGH (ref 70–99)
Glucose-Capillary: 124 mg/dL — ABNORMAL HIGH (ref 70–99)
Glucose-Capillary: 49 mg/dL — ABNORMAL LOW (ref 70–99)
Glucose-Capillary: 56 mg/dL — ABNORMAL LOW (ref 70–99)
Glucose-Capillary: 76 mg/dL (ref 70–99)
Glucose-Capillary: 78 mg/dL (ref 70–99)
Glucose-Capillary: 95 mg/dL (ref 70–99)
Glucose-Capillary: 97 mg/dL (ref 70–99)
Glucose-Capillary: 97 mg/dL (ref 70–99)

## 2020-01-12 MED ORDER — DEXTROSE IN LACTATED RINGERS 5 % IV SOLN: INTRAVENOUS | Status: DC

## 2020-01-12 MED ORDER — DEXTROSE 50 % IV SOLN
INTRAVENOUS | Status: AC
  Administered 2020-01-12: 50 mL
  Filled 2020-01-12: qty 50

## 2020-01-12 MED ORDER — DEXTROSE 50 % IV SOLN
INTRAVENOUS | Status: AC
  Filled 2020-01-12: qty 50

## 2020-01-12 MED ORDER — PIPERACILLIN-TAZOBACTAM 3.375 G IVPB
3.3750 g | Freq: Three times a day (TID) | INTRAVENOUS | Status: DC
  Administered 2020-01-12 – 2020-01-16 (×12): 3.375 g via INTRAVENOUS
  Filled 2020-01-12 (×12): qty 50

## 2020-01-12 MED ORDER — DEXTROSE 50 % IV SOLN
25.0000 g | INTRAVENOUS | Status: AC
  Administered 2020-01-12: 25 g via INTRAVENOUS

## 2020-01-12 NOTE — Progress Notes (Signed)
Nurse was told during report that patient was I&O using a foley at around 1600 and that UOP was not great enough to leave foley in place. Patient bladder scanned at 2145 and found to only have 17ml in her bladder. Will give bolus per order and re-assess.   During this time patient also began having severe agitation. She almost removed her PIV and was spitting, grabbing, and kicking at staff. Medications given as prescribed and mitts placed on patients hands.

## 2020-01-12 NOTE — Progress Notes (Signed)
PHARMACY NOTE:  ANTIMICROBIAL RENAL DOSAGE ADJUSTMENT  Current antimicrobial regimen includes a mismatch between antimicrobial dosage and estimated renal function.  As per policy approved by the Pharmacy & Therapeutics and Medical Executive Committees, the antimicrobial dosage will be adjusted accordingly.  Current antimicrobial dosage: Zosyn 2.25gm IV Q6h   Indication: intra-abdominal infection  Renal Function:  Estimated Creatinine Clearance: 25.8 mL/min (A) (by C-G formula based on SCr of 1.15 mg/dL (H)). []      On intermittent HD, scheduled: []      On CRRT    Antimicrobial dosage has been changed to:  Zosyn 3.375gm IV Q8h to be infused over 4hrs   Additional comments:   Thank you for allowing pharmacy to be a part of this patient's care.  , [facility] 01/12/2020 3:58 AM

## 2020-01-12 NOTE — TOC Initial Note (Signed)
Transition of Care [facility]) - Initial/Assessment Note    Patient Details  Name: Tracy Barton MRN: [id number] Date of Birth: [birth year]  Transition of Care [facility]) CM/SW Contact:    Golda Acre, RN Phone Number: 01/12/2020, 11:40 AM  Clinical Narrative:                  75 y.o. female with a history of hepatitic C infection, alcohol abuse. Patient presented secondary to abdominal pain and found to have a perforated gastric ulcer requiring emergent surgery.   Assessment & Plan:   Principal Problem:   Perforated prepyloric gastric ulcer s/p omental Cheree Ditto patch 01/10/2020 Active Problems:   Smoker   Gastritis and gastroduodenitis   Trigeminal neuralgia   Hep C w/o coma, chronic (HCC)   History of medication noncompliance   ARF (acute renal failure) (HCC)   S/P laparoscopic splenectomy   Perforated gastric ulcer Patient underwent laparoscopic surgery on 1/8 for which patient underwent splenectomy, washout/lysis of adhesions and omental patch -Per primary -Recommend decreasing narcotic dosing secondary to age/weight/Ativan for CIWA  Alcohol abuse Alcohol withdrawal -Continue CIWA -Continue thiamine, folate, MVI  AKI Baseline creatinine appears to be around 1.1. Creatinine of 1.74 on admission in setting of acute abdomen. On IV fluids. Creatinine of 1.56 today -Continue IV fluids PLAN: WILL NEED SUBSTANCE ABUSE RESOURCES ONCE STABLE, PLAN IS TO RETURN TO HOME PROGRESSION:WBC-20.3, HGB 8.0, BUN=24,CREATINE=1.15  Expected Discharge Plan: Home/Self Care Barriers to Discharge: Continued Medical Work up   Patient Goals and CMS Choice Patient states their goals for this hospitalization and ongoing recovery are:: I GUESS TO GO BACK HOME CMS Medicare.gov Compare Post Acute Care list provided to:: Patient    Expected Discharge Plan and Services Expected Discharge Plan: Home/Self Care   Discharge Planning Services: CM Consult   Living arrangements for the past 2  months: Apartment                                      Prior Living Arrangements/Services Living arrangements for the past 2 months: Apartment Lives with:: Self Patient language and need for interpreter reviewed:: Yes Do you feel safe going back to the place where you live?: Yes      Need for Family Participation in Patient Care: Yes (Comment) Care giver support system in place?: Yes (comment)   Criminal Activity/Legal Involvement Pertinent to Current Situation/Hospitalization: No - Comment as needed  Activities of Daily Living      Permission Sought/Granted                  Emotional Assessment Appearance:: Appears stated age Attitude/Demeanor/Rapport: Apprehensive Affect (typically observed): Agitated Orientation: : Oriented to Self,Oriented to Place,Oriented to  Time,Oriented to Situation Alcohol / Substance Use: Alcohol Use,Tobacco Use Psych Involvement: No (comment)  Admission diagnosis:  Perforated gastric ulcer (HCC) [K25.5] Patient Active Problem List   Diagnosis Date Noted  . Perforated prepyloric gastric ulcer s/p omental Cheree Ditto patch 01/10/2020 01/10/2020  . History of medication noncompliance 01/10/2020  . ARF (acute renal failure) (HCC) 01/10/2020  . 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 Hep C w/o coma, chronic (HCC) 09/12/2014  . Head injury 09/11/2014  . Back injury 04/15/2014  . Smoker 03/24/2013  . Gastritis and gastroduodenitis 03/24/2013  . Loss of weight 03/24/2013  . Unspecified vitamin D deficiency 03/24/2013  . Trigeminal neuralgia 06/01/2011   PCP:  Patient, No Pcp Per Pharmacy:  [id number] [place], [place] - [address] AT [facility] [address] [place] [address] Phone: [phone] Fax: [phone]     Social Determinants of Health (SDOH) Interventions    Readmission Risk Interventions No flowsheet data found.

## 2020-01-12 NOTE — Progress Notes (Signed)
PROGRESS NOTE    Tracy Barton  PTW:[id number] DOB: 12-May-1945 DOA: 01/10/2020 PCP: Patient, No Pcp Per   Brief Narrative: Tracy Barton is a 75 y.o. female with a history of hepatitic C infection, alcohol abuse. Patient presented secondary to abdominal pain and found to have a perforated gastric ulcer requiring emergent surgery.   Assessment & Plan:   Principal Problem:   Perforated prepyloric gastric ulcer s/p omental Cheree Ditto patch 01/10/2020 Active Problems:   Smoker   Gastritis and gastroduodenitis   Trigeminal neuralgia   Hep C w/o coma, chronic (HCC)   History of medication noncompliance   ARF (acute renal failure) (HCC)   S/P laparoscopic splenectomy   Perforated gastric ulcer Patient underwent laparoscopic surgery on 1/8 for which patient underwent splenectomy, washout/lysis of adhesions and omental patch -Per primary -Recommend decreasing narcotic dosing secondary to age/weight/Ativan for CIWA  Alcohol abuse Alcohol withdrawal -Continue CIWA -Continue thiamine, folate, MVI  AKI Baseline creatinine appears to be around 1.1. Creatinine of 1.74 on admission in setting of acute abdomen. On IV fluids. Creatinine of 1.56 today -Continue IV fluids  Hypoglycemia Mild. Given dextrose IV fluids but with recurrent hypoglycemia while on LR fluids. Now on maintenance IV fluids with dextrose. Currently NPO per primary -Diet per primary -Continue CBG monitoring q 4 hours while NPO -Discontinue insulin coverage  Metabolic acidosis Elevated anion gap. Likely secondary to AKI. Resolved.  Nutrition -Per priamry  Leukocytosis Likely reactive and secondary to recent surgery. -Daily CBC  Acute blood loss anemia Likely secondary to recent surgery. Stable. -CBC daily; transfuse as needed  Tobacco abuse -If patient desires, start nicotine patch   DVT prophylaxis: Per primary Code Status:   Code Status: Full Code Family Communication: None at  bedside Disposition Plan: Per primary   Procedures:   LAPAROSCOPIC SURGERY (01/10/2020)  Antimicrobials:  Zosyn IV   Subjective: No issues  Objective: Vitals:   01/12/20 0600 01/12/20 0700 01/12/20 0800 01/12/20 0900  BP: 134/61 (!) 139/58 134/72 134/60  Pulse:    72  Resp: (!) 9 10 12 10   Temp:   98.9 F (37.2 C)   TempSrc:   Oral   SpO2:    (!) 89%  Weight:      Height:        Intake/Output Summary (Last 24 hours) at 01/12/2020 1024 Last data filed at 01/12/2020 1006 Gross per 24 hour  Intake 2721.12 ml  Output 450 ml  Net 2271.12 ml   Filed Weights   01/10/20 1401 01/10/20 1901  Weight: 49 kg 38.7 kg    Examination:  General exam: Appears calm and comfortable Respiratory system: Clear to auscultation. Respiratory effort normal. Cardiovascular system: S1 & S2 heard, RRR. No murmurs, rubs, gallops or clicks. Gastrointestinal system: Abdomen is nondistended, soft and mild-moderately tender. Decreased bowel sounds heard. Central nervous system: Alert and oriented to person only. Musculoskeletal: No edema. No calf tenderness Skin: No cyanosis. Psychiatry: Judgement and insight appear impaired    Data Reviewed: I have personally reviewed following labs and imaging studies  CBC Lab Results  Component Value Date   WBC 20.3 (H) 01/12/2020   RBC 2.62 (L) 01/12/2020   HGB 8.0 (L) 01/12/2020   HCT 24.6 (L) 01/12/2020   MCV 93.9 01/12/2020   MCH 30.5 01/12/2020   PLT 248 01/12/2020   MCHC 32.5 01/12/2020   RDW 13.2 01/12/2020   LYMPHSABS 0.7 01/10/2020   MONOABS 0.1 01/10/2020   EOSABS 0.0 01/10/2020   BASOSABS 0.0 01/10/2020  Last metabolic panel Lab Results  Component Value Date   NA 141 01/12/2020   K 3.8 01/12/2020   CL 105 01/12/2020   CO2 26 01/12/2020   BUN 24 (H) 01/12/2020   CREATININE 1.15 (H) 01/12/2020   GLUCOSE 132 (H) 01/12/2020   GFRNONAA 50 (L) 01/12/2020   GFRAA 54 (L) 03/08/2019   CALCIUM 8.3 (L) 01/12/2020   PHOS 2.8  01/11/2020   PROT 6.9 01/10/2020   ALBUMIN 3.9 01/10/2020   BILITOT 1.0 01/10/2020   ALKPHOS 74 01/10/2020   AST 29 01/10/2020   ALT 8 01/10/2020   ANIONGAP 10 01/12/2020    CBG (last 3)  Recent Labs    01/12/20 0508 01/12/20 0828 01/12/20 1010  GLUCAP 124* 49* 120*     GFR: Estimated Creatinine Clearance: 25.8 mL/min (A) (by C-G formula based on SCr of 1.15 mg/dL (H)).  Coagulation Profile: No results for input(s): INR, PROTIME in the last 168 hours.  Recent Results (from the past 240 hour(s))  Blood culture (routine x 2)     Status: None (Preliminary result)   Collection Time: 01/10/20  9:33 AM   Specimen: BLOOD  Result Value Ref Range Status   Specimen Description   Final    BLOOD RIGHT ANTECUBITAL Performed at [facility], [address], [place], [place] [address]    Special Requests   Final    BOTTLES DRAWN AEROBIC AND ANAEROBIC Blood Culture results may not be optimal due to an inadequate volume of blood received in culture bottles Performed at [facility], [address], [place], [place] [address]    Culture   Final    NO GROWTH 2 DAYS Performed at [facility], [address], [place], [place] [address]    Report Status PENDING  Incomplete  Blood culture (routine x 2)     Status: None (Preliminary result)   Collection Time: 01/10/20 10:09 AM   Specimen: BLOOD RIGHT FOREARM  Result Value Ref Range Status   Specimen Description   Final    BLOOD RIGHT FOREARM Performed at [facility], [address], [place], [place] [address]    Special Requests   Final    BOTTLES DRAWN AEROBIC AND ANAEROBIC Blood Culture results may not be optimal due to an inadequate volume of blood received in culture bottles Performed at [facility], [address] [place], [place] [address]    Culture   Final    NO GROWTH 2 DAYS Performed at [facility], [address] [place], [place] [address]     Report Status PENDING  Incomplete  Resp Panel by RT-PCR (Flu A&B, Covid) Nasopharyngeal Swab     Status: None   Collection Time: 01/10/20 11:09 AM   Specimen: Nasopharyngeal Swab; Nasopharyngeal(NP) swabs in vial transport medium  Result Value Ref Range Status   SARS Coronavirus 2 by RT PCR NEGATIVE NEGATIVE Final    Comment: (NOTE) SARS-CoV-2 target nucleic acids are NOT DETECTED.  The SARS-CoV-2 RNA is generally detectable in upper respiratory specimens during the acute phase of infection. The lowest concentration of SARS-CoV-2 viral copies this assay can detect is 138 copies/mL. A negative result does not preclude SARS-Cov-2 infection and should not be used as the sole basis for treatment or other patient management decisions. A negative result may occur with  improper specimen collection/handling, submission of specimen other than nasopharyngeal swab, presence of viral mutation(s) within the areas targeted by this assay,  and inadequate number of viral copies(<138 copies/mL). A negative result must be combined with clinical observations, patient history, and epidemiological information. The expected result is Negative.  Fact Sheet for Patients:  BloggerCourse.com  Fact Sheet for Healthcare Providers:  SeriousBroker.it  This test is no t yet approved or cleared by the [place] FDA and  has been authorized for detection and/or diagnosis of SARS-CoV-2 by FDA under an Emergency Use Authorization (EUA). This EUA will remain  in effect (meaning this test can be used) for the duration of the COVID-19 declaration under Section 564(b)(1) of the Act, 21 U.S.C.section 360bbb-3(b)(1), unless the authorization is terminated  or revoked sooner.       Influenza A by PCR NEGATIVE NEGATIVE Final   Influenza B by PCR NEGATIVE NEGATIVE Final    Comment: (NOTE) The Xpert Xpress SARS-CoV-2/FLU/RSV plus assay is intended as an aid in the  diagnosis of influenza from Nasopharyngeal swab specimens and should not be used as a sole basis for treatment. Nasal washings and aspirates are unacceptable for Xpert Xpress SARS-CoV-2/FLU/RSV testing.  Fact Sheet for Patients: BloggerCourse.com  Fact Sheet for Healthcare Providers: SeriousBroker.it  This test is not yet approved or cleared by the [place] FDA and has been authorized for detection and/or diagnosis of SARS-CoV-2 by FDA under an Emergency Use Authorization (EUA). This EUA will remain in effect (meaning this test can be used) for the duration of the COVID-19 declaration under Section 564(b)(1) of the Act, 21 U.S.C. section 360bbb-3(b)(1), unless the authorization is terminated or revoked.  Performed at [facility], [address] [place], [place] [address]   MRSA PCR Screening     Status: None   Collection Time: 01/10/20 10:30 PM   Specimen: Nasal Mucosa; Nasopharyngeal  Result Value Ref Range Status   MRSA by PCR NEGATIVE NEGATIVE Final    Comment:        The GeneXpert MRSA Assay (FDA approved for NASAL specimens only), is one component of a comprehensive MRSA colonization surveillance program. It is not intended to diagnose MRSA infection nor to guide or monitor treatment for MRSA infections. Performed at [facility], [address], [place], [place] [address]         Radiology Studies: CT ABDOMEN PELVIS WO CONTRAST  Result Date: 01/10/2020 CLINICAL DATA:  Abdominal pain. EXAM: CT ABDOMEN AND PELVIS WITHOUT CONTRAST TECHNIQUE: Multidetector CT imaging of the abdomen and pelvis was performed following the standard protocol without IV contrast. COMPARISON:  None. FINDINGS: Lower chest: No acute abnormality. Hepatobiliary: No focal liver abnormality is seen. No gallstones, gallbladder wall thickening, or biliary dilatation. Pancreas: Unremarkable. No pancreatic  ductal dilatation or surrounding inflammatory changes. Spleen: Normal in size without focal abnormality. Adrenals/Urinary Tract: Adrenal glands are unremarkable. Kidneys are normal, without renal calculi, focal lesion, or hydronephrosis. Bladder is unremarkable. Stomach/Bowel: Evaluation of the bowel is markedly limited due to the paucity of intra-abdominal fat and oral contrast. The stomach and small bowel are nondistended and grossly unremarkable. Colonic diverticuli are identified, particularly distally. No convincing evidence of diverticulitis. The remainder of the colon is grossly unremarkable. The appendix is best visualized on series 2, images 52 and 53, unremarkable in appearance and air-filled distally. Vascular/Lymphatic: Calcified atherosclerosis is seen in the tortuous nonaneurysmal aorta. No obvious adenopathy. Reproductive: Uterus and bilateral adnexa are unremarkable. Other: There is free throughout the abdomen. There is also a moderate amount of free fluid. Musculoskeletal: Mild AVN in the left femoral head. IMPRESSION: 1. Free air  and free fluid in the abdomen and pelvis is identified. Findings are consistent with a bowel perforation but no source for the free air is otherwise seen. 2. Evaluation of the bowel is markedly limited due to the paucity of intra-abdominal fat and oral contrast. The appendix appears normal however. 3. Colonic diverticulosis without diverticulitis. 4. Calcified atherosclerosis in the tortuous nonaneurysmal aorta. 5. Mild AVN in the left femoral head. 6. Aortic atherosclerosis. Findings called to the patient's PA, Arthor CaptainAbigail Harris. Aortic Atherosclerosis (ICD10-I70.0). Electronically Signed   By: Gerome Samavid  Williams III M.D   On: 01/10/2020 11:09   DG Abd 1 View  Result Date: 01/11/2020 CLINICAL DATA:  NG tube placement EXAM: ABDOMEN - 1 VIEW COMPARISON:  None. FINDINGS: The NG tube terminates in the stomach. IMPRESSION: NG tube terminates in the stomach. Electronically Signed    By: Gerome Samavid  Williams III M.D   On: 01/11/2020 16:32        Scheduled Meds: . Chlorhexidine Gluconate Cloth  6 each Topical Daily  . heparin  5,000 Units Subcutaneous Q8H  . influenza vaccine adjuvanted  0.5 mL Intramuscular Tomorrow-1000  . lip balm  1 application Topical BID  . mouth rinse  15 mL Mouth Rinse BID  . meningococcal oligosaccharide  0.5 mL Intramuscular Once  . [START ON 01/14/2020] pantoprazole  40 mg Intravenous Q12H  . thiamine  100 mg Oral Daily   Or  . thiamine  100 mg Intravenous Daily   Continuous Infusions: . sodium chloride    . dextrose 5% lactated ringers 75 mL/hr at 01/12/20 1006  . lactated ringers    . methocarbamol (ROBAXIN) IV    . ondansetron (ZOFRAN) IV    . pantoprozole (PROTONIX) infusion 8 mg/hr (01/12/20 1006)  . piperacillin-tazobactam (ZOSYN)  IV 12.5 mL/hr at 01/12/20 1006     LOS: 2 days     Jacquelin Hawkingalph Porschia Willbanks, MD [facility] 01/12/2020, 10:24 AM  If 7PM-7AM, please contact night-coverage [email or web address]

## 2020-01-12 NOTE — Progress Notes (Signed)
Inpatient Diabetes Program Recommendations  AACE/ADA: New Consensus Statement on Inpatient Glycemic Control (2015)  Target Ranges:  Prepandial:   less than 140 mg/dL      Peak postprandial:   less than 180 mg/dL (1-2 hours)      Critically ill patients:  140 - 180 mg/dL   Results for LARITA, DEREMER (MRN [id number]) as of 01/12/2020 10:01  Ref. Range 01/11/2020 23:58 01/12/2020 00:41 01/12/2020 01:17 01/12/2020 04:03 01/12/2020 05:08 01/12/2020 08:28  Glucose-Capillary Latest Ref Range: 70 - 99 mg/dL 57 (L) 78 95 56 (L) 081 (H) 49 (L)   Admit with: Perforated prepyloric gastric ulcer s/p omental Cheree Ditto patch 01/10/2020   Current Insulin Orders: Novolog Moderate Correction Scale/ SSI (0-15 units) Q4 hours     MD- Multiple Hypoglycemic events since 12am today.  No Insulin has been administered to pt since admission.  Assume Hypo events may be due to NPO status--Note that D5LR started this AM at 75cc/hr  Please consider d/c of Novolog SSI but continue with CBG checks     --Will follow patient during hospitalization--  Ambrose Finland RN, MSN, CDE Diabetes Coordinator Inpatient Glycemic Control Team Team Pager: [phone] (8a-5p)

## 2020-01-12 NOTE — Progress Notes (Signed)
2 Days Post-Op   Subjective/Chief Complaint: No acute changes overnight Still requiring restraints    Objective: Vital signs in last 24 hours: Temp:  [97.8 F (36.6 C)-99 F (37.2 C)] 98 F (36.7 C) (01/10 0400) Pulse Rate:  [59-99] 66 (01/10 0500) Resp:  [7-22] 9 (01/10 0600) BP: (103-158)/(45-118) 134/61 (01/10 0600) SpO2:  [92 %-100 %] 98 % (01/10 0500) Last BM Date: 01/10/20  Intake/Output from previous day: 01/09 0701 - 01/10 0700 In: 2171.1 [I.V.:1628.6; NG/GT:80; IV Piggyback:177.5] Out: 450 [Urine:275; Emesis/NG output:175] Intake/Output this shift: No intake/output data recorded.  Exam: Sleeping but will wake up, confused Abdomen soft, dressings dry NG with bile  Lab Results:  Recent Labs    01/11/20 1005 01/12/20 0143  WBC 17.0* 20.3*  HGB 7.8* 8.0*  HCT 23.9* 24.6*  PLT 266 248   BMET Recent Labs    01/11/20 0236 01/12/20 0143  NA 141 141  K 4.1 3.8  CL 106 105  CO2 25 26  GLUCOSE 173* 132*  BUN 26* 24*  CREATININE 1.56* 1.15*  CALCIUM 8.0* 8.3*   PT/INR No results for input(s): LABPROT, INR in the last 72 hours. ABG No results for input(s): PHART, HCO3 in the last 72 hours.  Invalid input(s): PCO2, PO2  Studies/Results: CT ABDOMEN PELVIS WO CONTRAST  Result Date: 01/10/2020 CLINICAL DATA:  Abdominal pain. EXAM: CT ABDOMEN AND PELVIS WITHOUT CONTRAST TECHNIQUE: Multidetector CT imaging of the abdomen and pelvis was performed following the standard protocol without IV contrast. COMPARISON:  None. FINDINGS: Lower chest: No acute abnormality. Hepatobiliary: No focal liver abnormality is seen. No gallstones, gallbladder wall thickening, or biliary dilatation. Pancreas: Unremarkable. No pancreatic ductal dilatation or surrounding inflammatory changes. Spleen: Normal in size without focal abnormality. Adrenals/Urinary Tract: Adrenal glands are unremarkable. Kidneys are normal, without renal calculi, focal lesion, or hydronephrosis. Bladder is  unremarkable. Stomach/Bowel: Evaluation of the bowel is markedly limited due to the paucity of intra-abdominal fat and oral contrast. The stomach and small bowel are nondistended and grossly unremarkable. Colonic diverticuli are identified, particularly distally. No convincing evidence of diverticulitis. The remainder of the colon is grossly unremarkable. The appendix is best visualized on series 2, images 52 and 53, unremarkable in appearance and air-filled distally. Vascular/Lymphatic: Calcified atherosclerosis is seen in the tortuous nonaneurysmal aorta. No obvious adenopathy. Reproductive: Uterus and bilateral adnexa are unremarkable. Other: There is free throughout the abdomen. There is also a moderate amount of free fluid. Musculoskeletal: Mild AVN in the left femoral head. IMPRESSION: 1. Free air and free fluid in the abdomen and pelvis is identified. Findings are consistent with a bowel perforation but no source for the free air is otherwise seen. 2. Evaluation of the bowel is markedly limited due to the paucity of intra-abdominal fat and oral contrast. The appendix appears normal however. 3. Colonic diverticulosis without diverticulitis. 4. Calcified atherosclerosis in the tortuous nonaneurysmal aorta. 5. Mild AVN in the left femoral head. 6. Aortic atherosclerosis. Findings called to the patient's PA, Arthor Captain. Aortic Atherosclerosis (ICD10-I70.0). Electronically Signed   By: Gerome Sam III M.D   On: 01/10/2020 11:09   DG Abd 1 View  Result Date: 01/11/2020 CLINICAL DATA:  NG tube placement EXAM: ABDOMEN - 1 VIEW COMPARISON:  None. FINDINGS: The NG tube terminates in the stomach. IMPRESSION: NG tube terminates in the stomach. Electronically Signed   By: Gerome Sam III M.D   On: 01/11/2020 16:32    Anti-infectives: Anti-infectives (From admission, onward)   Start  Dose/Rate Route Frequency Ordered Stop   01/12/20 0600  piperacillin-tazobactam (ZOSYN) IVPB 3.375 g        3.375  g 12.5 mL/hr over 240 Minutes Intravenous Every 8 hours 01/12/20 0358     01/11/20 0000  piperacillin-tazobactam (ZOSYN) IVPB 2.25 g  Status:  Discontinued        2.25 g 100 mL/hr over 30 Minutes Intravenous Every 6 hours 01/10/20 2139 01/12/20 0358   01/10/20 2215  piperacillin-tazobactam (ZOSYN) IVPB 3.375 g  Status:  Discontinued        3.375 g 12.5 mL/hr over 240 Minutes Intravenous Every 8 hours 01/10/20 2122 01/10/20 2139   01/10/20 1200  cefoTEtan (CEFOTAN) 2 g in sodium chloride 0.9 % 100 mL IVPB        2 g 200 mL/hr over 30 Minutes Intravenous To [facility] 01/10/20 1133 01/11/20 0700   01/10/20 1115  piperacillin-tazobactam (ZOSYN) IVPB 3.375 g        3.375 g 100 mL/hr over 30 Minutes Intravenous  Once 01/10/20 1106 01/10/20 1200      Assessment/Plan: Principal Problem:   Perforated prepyloric gastric ulcer s/p omental Cheree Ditto patch 01/10/2020 Active Problems:   Smoker   Gastritis and gastroduodenitis   Trigeminal neuralgia   Hep C w/o coma, chronic (HCC)   History of medication noncompliance   ARF (acute renal failure) (HCC)   S/P laparoscopic splenectomy   2 Day Post-Op  01/10/2020  POST-OPERATIVE DIAGNOSIS:PERFORATED PREPYLORIC GASTRIC ULCER  PROCEDURE: LAPAROSCOPIC OMENTAL GRAHAM PATCH OF ULCER LAPAROSCOPIC SPLENECTOMY LAPAROSCOPIC WASHOUT/LYSIS OF ADHESIONS  LOS: 2 days   Still with confusion and delirium.  Needs restraints for safety and to keep the NG and lines in place CIWA protocol Continue NPO Continue IV antibiotics Disposition:  Continue ICU care  Abigail Miyamoto MD 01/12/2020

## 2020-01-13 ENCOUNTER — Inpatient Hospital Stay: Payer: Self-pay

## 2020-01-13 DIAGNOSIS — Z9114 Patient's other noncompliance with medication regimen: Secondary | ICD-10-CM | POA: Diagnosis not present

## 2020-01-13 DIAGNOSIS — B182 Chronic viral hepatitis C: Secondary | ICD-10-CM | POA: Diagnosis not present

## 2020-01-13 DIAGNOSIS — N179 Acute kidney failure, unspecified: Secondary | ICD-10-CM | POA: Diagnosis not present

## 2020-01-13 LAB — GLUCOSE, CAPILLARY
Glucose-Capillary: 104 mg/dL — ABNORMAL HIGH (ref 70–99)
Glucose-Capillary: 57 mg/dL — ABNORMAL LOW (ref 70–99)
Glucose-Capillary: 70 mg/dL (ref 70–99)
Glucose-Capillary: 73 mg/dL (ref 70–99)
Glucose-Capillary: 76 mg/dL (ref 70–99)
Glucose-Capillary: 80 mg/dL (ref 70–99)
Glucose-Capillary: 83 mg/dL (ref 70–99)

## 2020-01-13 LAB — CBC
HCT: 27.7 % — ABNORMAL LOW (ref 36.0–46.0)
Hemoglobin: 9 g/dL — ABNORMAL LOW (ref 12.0–15.0)
MCH: 29.9 pg (ref 26.0–34.0)
MCHC: 32.5 g/dL (ref 30.0–36.0)
MCV: 92 fL (ref 80.0–100.0)
Platelets: 201 10*3/uL (ref 150–400)
RBC: 3.01 MIL/uL — ABNORMAL LOW (ref 3.87–5.11)
RDW: 13.2 % (ref 11.5–15.5)
WBC: 13 10*3/uL — ABNORMAL HIGH (ref 4.0–10.5)
nRBC: 0 % (ref 0.0–0.2)

## 2020-01-13 LAB — SURGICAL PATHOLOGY

## 2020-01-13 LAB — BASIC METABOLIC PANEL WITH GFR
Anion gap: 6 (ref 5–15)
BUN: 13 mg/dL (ref 8–23)
CO2: 29 mmol/L (ref 22–32)
Calcium: 8.4 mg/dL — ABNORMAL LOW (ref 8.9–10.3)
Chloride: 109 mmol/L (ref 98–111)
Creatinine, Ser: 0.81 mg/dL (ref 0.44–1.00)
GFR, Estimated: >60 mL/min
Glucose, Bld: 99 mg/dL (ref 70–99)
Potassium: 3.4 mmol/L — ABNORMAL LOW (ref 3.5–5.1)
Sodium: 144 mmol/L (ref 135–145)

## 2020-01-13 MED ORDER — SODIUM CHLORIDE 0.9% FLUSH: 10.0000 mL | INTRAVENOUS | Status: DC | PRN

## 2020-01-13 MED ORDER — LORAZEPAM 1 MG PO TABS: 1.0000 mg | ORAL_TABLET | ORAL | Status: AC | PRN

## 2020-01-13 MED ORDER — LORAZEPAM 2 MG/ML IJ SOLN: 0.0000 mg | Freq: Three times a day (TID) | INTRAMUSCULAR | Status: DC

## 2020-01-13 MED ORDER — SODIUM CHLORIDE 0.9% FLUSH
10.0000 mL | Freq: Two times a day (BID) | INTRAVENOUS | Status: DC
  Administered 2020-01-13 – 2020-01-15 (×4): 10 mL
  Administered 2020-01-15: 40 mL
  Administered 2020-01-16 – 2020-01-17 (×2): 10 mL
  Administered 2020-01-17: 20 mL
  Administered 2020-01-18 – 2020-01-23 (×3): 10 mL

## 2020-01-13 MED ORDER — LORAZEPAM 2 MG/ML IJ SOLN
1.0000 mg | INTRAMUSCULAR | Status: AC | PRN
  Administered 2020-01-15: 2 mg via INTRAVENOUS
  Filled 2020-01-13: qty 1

## 2020-01-13 MED ORDER — LORAZEPAM 2 MG/ML IJ SOLN
0.0000 mg | INTRAMUSCULAR | Status: DC
  Administered 2020-01-13: 4 mg via INTRAVENOUS
  Administered 2020-01-14 (×2): 2 mg via INTRAVENOUS
  Filled 2020-01-13 (×2): qty 1
  Filled 2020-01-13: qty 2

## 2020-01-13 MED ORDER — DEXTROSE 50 % IV SOLN
12.5000 g | Freq: Once | INTRAVENOUS | Status: AC
  Administered 2020-01-13: 01:00:00 12.5 g via INTRAVENOUS

## 2020-01-13 MED ORDER — DEXTROSE 50 % IV SOLN
INTRAVENOUS | Status: AC
  Filled 2020-01-13: qty 50

## 2020-01-13 NOTE — Progress Notes (Signed)
PROGRESS NOTE    Tracy Barton  [id number] DOB: 03-Apr-1945 DOA: 01/10/2020 PCP: Patient, No Pcp Per   Brief Narrative: Tracy Barton is a 75 y.o. female with a history of hepatitic C infection, alcohol abuse. Patient presented secondary to abdominal pain and found to have a perforated gastric ulcer requiring emergent surgery.   Assessment & Plan:   Principal Problem:   Perforated prepyloric gastric ulcer s/p omental Cheree DittoGraham patch 01/10/2020 Active Problems:   Smoker   Gastritis and gastroduodenitis   Trigeminal neuralgia   Hep C w/o coma, chronic (HCC)   History of medication noncompliance   ARF (acute renal failure) (HCC)   S/P laparoscopic splenectomy   Perforated gastric ulcer Patient underwent laparoscopic surgery on 1/8 for which patient underwent splenectomy, washout/lysis of adhesions and omental patch -Per primary -Recommend decreasing narcotic dosing secondary to age/weight/Ativan for CIWA  Alcohol abuse Alcohol withdrawal Moderately high CIWA scores overnight. Mostly agitation and disorientation. Unsure if post-op pain is playing a role; patient is on dilaudid prn for treatment of pain. Avoid antipsychotics in treatment of delirium since driver is alcohol withdrawal and continue with Ativan treatment -Continue CIWA but will switch to stepdown level CIWA -Continue thiamine. Folate, MVI not ordered secondary to NPO status  AKI Baseline creatinine appears to be around 1.1. Creatinine of 1.74 on admission in setting of acute abdomen. Improved with IV fluids. Creatinine of 0.81 today. Resolved.  Hypoglycemia Mild. Given dextrose IV fluids but with recurrent hypoglycemia while on LR fluids. Now on maintenance IV fluids with dextrose. Currently NPO per primary Recurrent hypoglycemia. Insulin coverage discontinued. Hemoglobin A1C of 5.0%. -Diet per primary -Continue CBG monitoring q 4 hours while NPO  Metabolic acidosis Elevated anion gap. Likely secondary to  AKI. Resolved.  Nutrition -Per priamry  Leukocytosis Likely reactive and secondary to recent surgery. Improving. -Daily CBC  Acute blood loss anemia Likely secondary to recent surgery. Improving. -CBC daily; transfuse as needed  Tobacco abuse -If patient desires, start nicotine patch   DVT prophylaxis: Per primary Code Status:   Code Status: Full Code Family Communication: None at bedside Disposition Plan: Per primary   Procedures:   LAPAROSCOPIC SURGERY (01/10/2020)  Antimicrobials:  Zosyn IV   Subjective: Agitation overnight. Combativeness. Appears to be in significant pain this morning as she is moaning a lot and wincing.  Objective: Vitals:   01/12/20 2100 01/13/20 0406 01/13/20 0800 01/13/20 0900  BP:  (!) 126/56 (!) 166/64 (!) 149/65  Pulse:    67  Resp: (!) 8 11 14 11   Temp:   97.9 F (36.6 C)   TempSrc:   Axillary   SpO2:    96%  Weight:      Height:        Intake/Output Summary (Last 24 hours) at 01/13/2020 1004 Last data filed at 01/13/2020 0900 Gross per 24 hour  Intake 3150.89 ml  Output 725 ml  Net 2425.89 ml   Filed Weights   01/10/20 1401 01/10/20 1901  Weight: 49 kg 38.7 kg    Examination:  General exam: Appears calm and uncomfortable Respiratory system: Clear to auscultation. Respiratory effort normal. Cardiovascular system: S1 & S2 heard, RRR. No murmurs, rubs, gallops or clicks. Gastrointestinal system: Abdomen is nondistended, soft and moderately tender. Decreased bowel sounds heard. Central nervous system: Alert and not oriented. Musculoskeletal: No edema. No calf tenderness Skin: No cyanosis. No rashes Psychiatry: Judgement and insight appear impaired. Appears to be in pain.    Data Reviewed: I have  personally reviewed following labs and imaging studies  CBC Lab Results  Component Value Date   WBC 13.0 (H) 01/13/2020   RBC 3.01 (L) 01/13/2020   HGB 9.0 (L) 01/13/2020   HCT 27.7 (L) 01/13/2020   MCV 92.0 01/13/2020    MCH 29.9 01/13/2020   PLT 201 01/13/2020   MCHC 32.5 01/13/2020   RDW 13.2 01/13/2020   LYMPHSABS 0.7 01/10/2020   MONOABS 0.1 01/10/2020   EOSABS 0.0 01/10/2020   BASOSABS 0.0 01/10/2020     Last metabolic panel Lab Results  Component Value Date   NA 144 01/13/2020   K 3.4 (L) 01/13/2020   CL 109 01/13/2020   CO2 29 01/13/2020   BUN 13 01/13/2020   CREATININE 0.81 01/13/2020   GLUCOSE 99 01/13/2020   GFRNONAA >60 01/13/2020   GFRAA 54 (L) 03/08/2019   CALCIUM 8.4 (L) 01/13/2020   PHOS 2.8 01/11/2020   PROT 6.9 01/10/2020   ALBUMIN 3.9 01/10/2020   BILITOT 1.0 01/10/2020   ALKPHOS 74 01/10/2020   AST 29 01/10/2020   ALT 8 01/10/2020   ANIONGAP 6 01/13/2020    CBG (last 3)  Recent Labs    01/13/20 0131 01/13/20 0408 01/13/20 0812  GLUCAP 104* 70 73     GFR: Estimated Creatinine Clearance: 36.7 mL/min (by C-G formula based on SCr of 0.81 mg/dL).  Coagulation Profile: No results for input(s): INR, PROTIME in the last 168 hours.  Recent Results (from the past 240 hour(s))  Blood culture (routine x 2)     Status: None (Preliminary result)   Collection Time: 01/10/20  9:33 AM   Specimen: BLOOD  Result Value Ref Range Status   Specimen Description   Final    BLOOD RIGHT ANTECUBITAL Performed at [facility], [address] [place], [place] [address]    Special Requests   Final    BOTTLES DRAWN AEROBIC AND ANAEROBIC Blood Culture results may not be optimal due to an inadequate volume of blood received in culture bottles Performed at [facility], [address], [place], [place] [address]    Culture   Final    NO GROWTH 3 DAYS Performed at [facility], [address], [place], [place] [address]    Report Status PENDING  Incomplete  Blood culture (routine x 2)     Status: None (Preliminary result)   Collection Time: 01/10/20 10:09 AM   Specimen: BLOOD RIGHT FOREARM  Result Value Ref Range Status   Specimen  Description   Final    BLOOD RIGHT FOREARM Performed at [facility], [address] [place], [place] [address]    Special Requests   Final    BOTTLES DRAWN AEROBIC AND ANAEROBIC Blood Culture results may not be optimal due to an inadequate volume of blood received in culture bottles Performed at [facility], [address] [place], [place] [address]    Culture   Final    NO GROWTH 3 DAYS Performed at [facility], [address], [place], [place] [address]    Report Status PENDING  Incomplete  Resp Panel by RT-PCR (Flu A&B, Covid) Nasopharyngeal Swab     Status: None   Collection Time: 01/10/20 11:09 AM   Specimen: Nasopharyngeal Swab; Nasopharyngeal(NP) swabs in vial transport medium  Result Value Ref Range Status   SARS Coronavirus 2 by RT PCR NEGATIVE NEGATIVE Final    Comment: (NOTE) SARS-CoV-2 target nucleic acids are NOT DETECTED.  The SARS-CoV-2 RNA is generally detectable in upper respiratory specimens during the acute phase of infection. The lowest concentration of SARS-CoV-2 viral copies this assay can detect is 138 copies/mL. A negative result does not preclude SARS-Cov-2 infection and should not be used as the sole basis for treatment or other patient management decisions. A negative result may occur with  improper specimen collection/handling, submission of specimen other than nasopharyngeal swab, presence of viral mutation(s) within the areas targeted by this assay, and inadequate number of viral copies(<138 copies/mL). A negative result must be combined with clinical observations, patient history, and epidemiological information. The expected result is Negative.  Fact Sheet for Patients:  BloggerCourse.com  Fact Sheet for Healthcare Providers:  SeriousBroker.it  This test is no t yet approved or cleared by the [place] FDA and  has been authorized for detection and/or  diagnosis of SARS-CoV-2 by FDA under an Emergency Use Authorization (EUA). This EUA will remain  in effect (meaning this test can be used) for the duration of the COVID-19 declaration under Section 564(b)(1) of the Act, 21 U.S.C.section 360bbb-3(b)(1), unless the authorization is terminated  or revoked sooner.       Influenza A by PCR NEGATIVE NEGATIVE Final   Influenza B by PCR NEGATIVE NEGATIVE Final    Comment: (NOTE) The Xpert Xpress SARS-CoV-2/FLU/RSV plus assay is intended as an aid in the diagnosis of influenza from Nasopharyngeal swab specimens and should not be used as a sole basis for treatment. Nasal washings and aspirates are unacceptable for Xpert Xpress SARS-CoV-2/FLU/RSV testing.  Fact Sheet for Patients: BloggerCourse.com  Fact Sheet for Healthcare Providers: SeriousBroker.it  This test is not yet approved or cleared by the [place] FDA and has been authorized for detection and/or diagnosis of SARS-CoV-2 by FDA under an Emergency Use Authorization (EUA). This EUA will remain in effect (meaning this test can be used) for the duration of the COVID-19 declaration under Section 564(b)(1) of the Act, 21 U.S.C. section 360bbb-3(b)(1), unless the authorization is terminated or revoked.  Performed at [facility], [address], [place], [place] [address]   MRSA PCR Screening     Status: None   Collection Time: 01/10/20 10:30 PM   Specimen: Nasal Mucosa; Nasopharyngeal  Result Value Ref Range Status   MRSA by PCR NEGATIVE NEGATIVE Final    Comment:        The GeneXpert MRSA Assay (FDA approved for NASAL specimens only), is one component of a comprehensive MRSA colonization surveillance program. It is not intended to diagnose MRSA infection nor to guide or monitor treatment for MRSA infections. Performed at [facility], [address], [place], [place] [address]          Radiology Studies: DG Abd 1 View  Result Date: 01/11/2020 CLINICAL DATA:  NG tube placement EXAM: ABDOMEN - 1 VIEW COMPARISON:  None. FINDINGS: The NG tube terminates in the stomach. IMPRESSION: NG tube terminates in the stomach. Electronically Signed   By: Gerome Sam III M.D   On: 01/11/2020 16:32   [place] EKG SITE RITE  Result Date: 01/13/2020 If Site Rite image not attached, placement could not be confirmed due to current cardiac rhythm.       Scheduled Meds: . Chlorhexidine Gluconate Cloth  6 each Topical Daily  . heparin  5,000 Units Subcutaneous Q8H  . influenza vaccine adjuvanted  0.5 mL Intramuscular Tomorrow-1000  . lip balm  1 application Topical BID  . mouth rinse  15 mL Mouth  Rinse BID  . meningococcal oligosaccharide  0.5 mL Intramuscular Once  . [START ON 01/14/2020] pantoprazole  40 mg Intravenous Q12H  . thiamine  100 mg Oral Daily   Or  . thiamine  100 mg Intravenous Daily   Continuous Infusions: . sodium chloride Stopped (01/13/20 0337)  . dextrose 5% lactated ringers 75 mL/hr at 01/13/20 0900  . methocarbamol (ROBAXIN) IV    . ondansetron (ZOFRAN) IV    . pantoprozole (PROTONIX) infusion 8 mg/hr (01/13/20 0900)  . piperacillin-tazobactam (ZOSYN)  IV Stopped (01/13/20 0052)     LOS: 3 days     Jacquelin Hawking, MD [facility] 01/13/2020, 10:04 AM  If 7PM-7AM, please contact night-coverage [email or web address]

## 2020-01-13 NOTE — Progress Notes (Signed)
3 Days Post-Op   Subjective/Chief Complaint: Still combative    Objective: Vital signs in last 24 hours: Temp:  [98 F (36.7 C)-98.2 F (36.8 C)] 98.1 F (36.7 C) (01/10 2000) Pulse Rate:  [59-88] 70 (01/10 2000) Resp:  [7-13] 11 (01/11 0406) BP: (121-161)/(53-74) 126/56 (01/11 0406) SpO2:  [89 %-100 %] 98 % (01/10 2000) Last BM Date: 01/10/20  Intake/Output from previous day: 01/10 0701 - 01/11 0700 In: 2445.4 [I.V.:2298.1; NG/GT:30; IV Piggyback:117.4] Out: 625 [Urine:425; Emesis/NG output:200] Intake/Output this shift: No intake/output data recorded.  Exam: Will occasionally talk, knows she is in the hospital Lungs clear Abdomen soft, appropriately tender  Lab Results:  Recent Labs    01/12/20 0143 01/13/20 0310  WBC 20.3* 13.0*  HGB 8.0* 9.0*  HCT 24.6* 27.7*  PLT 248 201   BMET Recent Labs    01/12/20 0143 01/13/20 0310  NA 141 144  K 3.8 3.4*  CL 105 109  CO2 26 29  GLUCOSE 132* 99  BUN 24* 13  CREATININE 1.15* 0.81  CALCIUM 8.3* 8.4*   PT/INR No results for input(s): LABPROT, INR in the last 72 hours. ABG No results for input(s): PHART, HCO3 in the last 72 hours.  Invalid input(s): PCO2, PO2  Studies/Results: DG Abd 1 View  Result Date: 01/11/2020 CLINICAL DATA:  NG tube placement EXAM: ABDOMEN - 1 VIEW COMPARISON:  None. FINDINGS: The NG tube terminates in the stomach. IMPRESSION: NG tube terminates in the stomach. Electronically Signed   By: Gerome Sam III M.D   On: 01/11/2020 16:32    Anti-infectives: Anti-infectives (From admission, onward)   Start     Dose/Rate Route Frequency Ordered Stop   01/12/20 0600  piperacillin-tazobactam (ZOSYN) IVPB 3.375 g        3.375 g 12.5 mL/hr over 240 Minutes Intravenous Every 8 hours 01/12/20 0358     01/11/20 0000  piperacillin-tazobactam (ZOSYN) IVPB 2.25 g  Status:  Discontinued        2.25 g 100 mL/hr over 30 Minutes Intravenous Every 6 hours 01/10/20 2139 01/12/20 0358   01/10/20 2215   piperacillin-tazobactam (ZOSYN) IVPB 3.375 g  Status:  Discontinued        3.375 g 12.5 mL/hr over 240 Minutes Intravenous Every 8 hours 01/10/20 2122 01/10/20 2139   01/10/20 1200  cefoTEtan (CEFOTAN) 2 g in sodium chloride 0.9 % 100 mL IVPB        2 g 200 mL/hr over 30 Minutes Intravenous To [facility] 01/10/20 1133 01/11/20 0700   01/10/20 1115  piperacillin-tazobactam (ZOSYN) IVPB 3.375 g        3.375 g 100 mL/hr over 30 Minutes Intravenous  Once 01/10/20 1106 01/10/20 1200      Assessment/Plan: s/p Procedure(s): LAPAROSCOPY DIAGNOSTIC laparoscopic washout omental patch splenectomy (N/A) EXPLORATORY LAPAROTOMY (N/A)  Principal Problem: Perforated prepyloric gastric ulcer s/p omental Cheree Ditto patch 01/10/2020 Active Problems: Smoker Gastritis and gastroduodenitis Trigeminal neuralgia Hep C w/o coma, chronic (HCC) History of medication noncompliance ARF (acute renal failure) (HCC) S/P laparoscopic splenectomy     LOS: 3 days    Still requiring restraints for safety Continue NG and NPO Remain in ICU WBC decreasing.  Continue antibiotics for a few more days.   Abigail Miyamoto MD 01/13/2020

## 2020-01-13 NOTE — Progress Notes (Signed)
Peripherally Inserted Central Catheter Placement  The IV Nurse has discussed with the patient and/or persons authorized to consent for the patient, the purpose of this procedure and the potential benefits and risks involved with this procedure.  The benefits include less needle sticks, lab draws from the catheter, and the patient may be discharged home with the catheter. Risks include, but not limited to, infection, bleeding, blood clot (thrombus formation), and puncture of an artery; nerve damage and irregular heartbeat and possibility to perform a PICC exchange if needed/ordered by physician.  Alternatives to this procedure were also discussed.  Bard Power PICC patient education guide, fact sheet on infection prevention and patient information card has been provided to patient /or left at bedside.   Consent obtained via telephone with daughter  PICC Placement Documentation  PICC Triple Lumen 01/13/20 PICC Right Brachial 36 cm 0 cm (Active)  Indication for Insertion or Continuance of Line Poor Vasculature-patient has had multiple peripheral attempts or PIVs lasting less than 24 hours 01/13/20 1600  Exposed Catheter (cm) 0 cm 01/13/20 1600  Site Assessment Clean;Dry;Intact 01/13/20 1600  Lumen #1 Status Flushed;Saline locked;Blood return noted 01/13/20 1600  Lumen #2 Status Flushed;Saline locked;Blood return noted 01/13/20 1600  Lumen #3 Status Flushed;Saline locked;Blood return noted 01/13/20 1600  Dressing Type Transparent;Securing device 01/13/20 1600  Dressing Status Clean;Dry;Intact 01/13/20 1600  Antimicrobial disc in place? Yes 01/13/20 1600  Safety Lock Not Applicable 01/13/20 1600  Line Care Connections checked and tightened 01/13/20 1600  Line Adjustment (NICU/IV Team Only) No 01/13/20 1600  Dressing Intervention New dressing 01/13/20 1600  Dressing Change Due 01/20/20 01/13/20 1600       Franne Grip Renee 01/13/2020, 4:14 PM

## 2020-01-13 NOTE — Progress Notes (Signed)
Bilateral ankle restraints requested due to patient having sudden violent outbursts. Patient able to contort her body in a way that still interferes with care and necessary lines even while wearing bilateral wrist restraints. Patient is thrashing at staff and attempting to kick while providing care, she also tried to spit at a tech. CIWA's being done q4. Patient also receiving bolus per order d/t no urine output and no accumulation in the bladder when bladder scanned.

## 2020-01-13 NOTE — Progress Notes (Signed)
PIV consult: BUE assessed with ultrasound. No suitable vein found for PIV/midline cannulation. Pt with edema to BUE, and several previous IV sites/ attempts. Recommend central line due to poor vasculature.

## 2020-01-14 ENCOUNTER — Inpatient Hospital Stay (HOSPITAL_COMMUNITY): Payer: Medicare Other

## 2020-01-14 DIAGNOSIS — N179 Acute kidney failure, unspecified: Secondary | ICD-10-CM | POA: Diagnosis not present

## 2020-01-14 DIAGNOSIS — Z9114 Patient's other noncompliance with medication regimen: Secondary | ICD-10-CM | POA: Diagnosis not present

## 2020-01-14 DIAGNOSIS — B182 Chronic viral hepatitis C: Secondary | ICD-10-CM | POA: Diagnosis not present

## 2020-01-14 LAB — CBC
HCT: 21.2 % — ABNORMAL LOW (ref 36.0–46.0)
HCT: 21.9 % — ABNORMAL LOW (ref 36.0–46.0)
Hemoglobin: 7 g/dL — ABNORMAL LOW (ref 12.0–15.0)
Hemoglobin: 7.2 g/dL — ABNORMAL LOW (ref 12.0–15.0)
MCH: 30.2 pg (ref 26.0–34.0)
MCH: 29.9 pg (ref 26.0–34.0)
MCHC: 33 g/dL (ref 30.0–36.0)
MCHC: 32.9 g/dL (ref 30.0–36.0)
MCV: 91.4 fL (ref 80.0–100.0)
MCV: 90.9 fL (ref 80.0–100.0)
Platelets: 318 10*3/uL (ref 150–400)
Platelets: 330 10*3/uL (ref 150–400)
RBC: 2.32 MIL/uL — ABNORMAL LOW (ref 3.87–5.11)
RBC: 2.41 MIL/uL — ABNORMAL LOW (ref 3.87–5.11)
RDW: 12.9 % (ref 11.5–15.5)
RDW: 12.7 % (ref 11.5–15.5)
WBC: 8 10*3/uL (ref 4.0–10.5)
WBC: 9 10*3/uL (ref 4.0–10.5)
nRBC: 0 % (ref 0.0–0.2)
nRBC: 0 % (ref 0.0–0.2)

## 2020-01-14 LAB — GLUCOSE, CAPILLARY
Glucose-Capillary: 52 mg/dL — ABNORMAL LOW (ref 70–99)
Glucose-Capillary: 72 mg/dL (ref 70–99)
Glucose-Capillary: 75 mg/dL (ref 70–99)
Glucose-Capillary: 76 mg/dL (ref 70–99)
Glucose-Capillary: 79 mg/dL (ref 70–99)
Glucose-Capillary: 79 mg/dL (ref 70–99)
Glucose-Capillary: 89 mg/dL (ref 70–99)
Glucose-Capillary: 92 mg/dL (ref 70–99)

## 2020-01-14 LAB — BASIC METABOLIC PANEL
Anion gap: 8 (ref 5–15)
BUN: 6 mg/dL — ABNORMAL LOW (ref 8–23)
CO2: 30 mmol/L (ref 22–32)
Calcium: 8.2 mg/dL — ABNORMAL LOW (ref 8.9–10.3)
Chloride: 103 mmol/L (ref 98–111)
Creatinine, Ser: 0.6 mg/dL (ref 0.44–1.00)
GFR, Estimated: >60 mL/min (ref >60)
Glucose, Bld: 92 mg/dL (ref 70–99)
Potassium: 2.8 mmol/L — ABNORMAL LOW (ref 3.5–5.1)
Sodium: 141 mmol/L (ref 135–145)

## 2020-01-14 LAB — MAGNESIUM: Magnesium: 1.3 mg/dL — ABNORMAL LOW (ref 1.7–2.4)

## 2020-01-14 IMAGING — RF DG UGI W SINGLE CM
9 of 14 series · 15 of 24 positions shown · non-contrast
Comparison: [DATE]

CLINICAL DATA: History of perforated gastric ulcer.

EXAM:
UPPER GI SERIES WITH KUB
TECHNIQUE: After obtaining a scout radiograph a routine upper GI series was
performed using water-soluble contrast through the existing NG tube.
FLUOROSCOPY TIME:  Fluoroscopy Time:  2 minutes 30 seconds
Radiation Exposure Index (if provided by the fluoroscopic device):
32.2 mGy
Number of Acquired Spot Images: 4

[Series 1: t abdomen supine · 0.15mm/px · 1 of 1 slices shown]
[im 1/1]
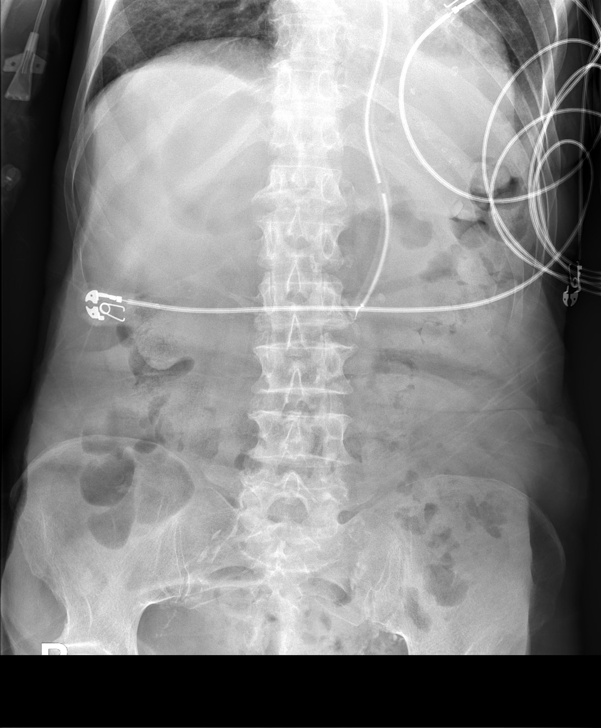

[Series 3: cp_standard · 0.18mm/px · 1 of 1 slices shown (1 of 6)]
[im 1/1]
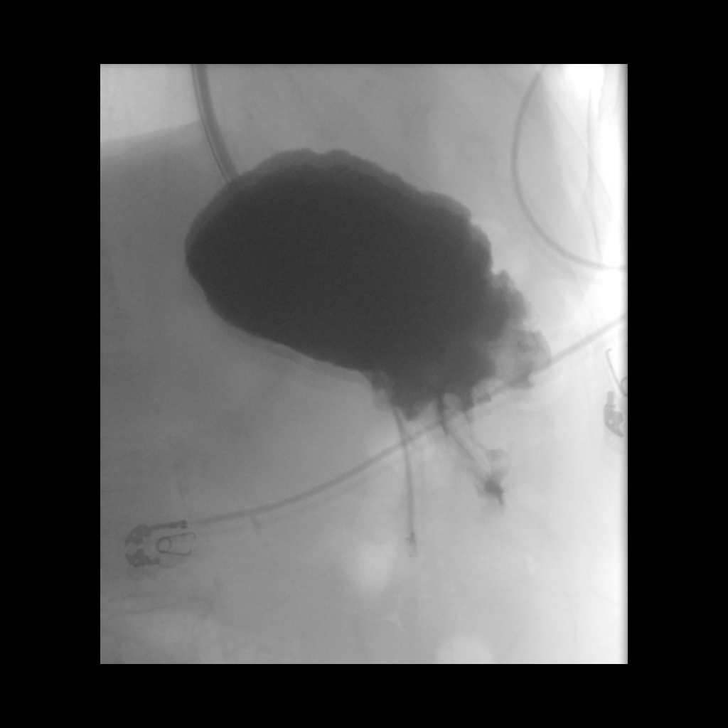

[Series 5: fluoro_iodine 2fps_bw · 0.18mm/px · 1 of 1 slices shown (1 of 2)]
[im 1/1]
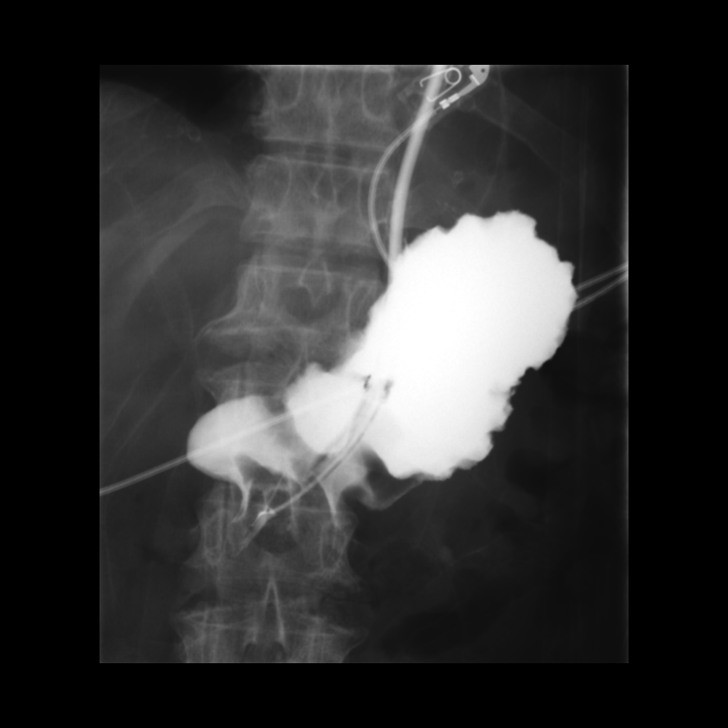

[Series 6: fluoro_iodine 2fps_bw · 0.18mm/px · 1 of 1 slices shown (2 of 2)]
[im 1/1]
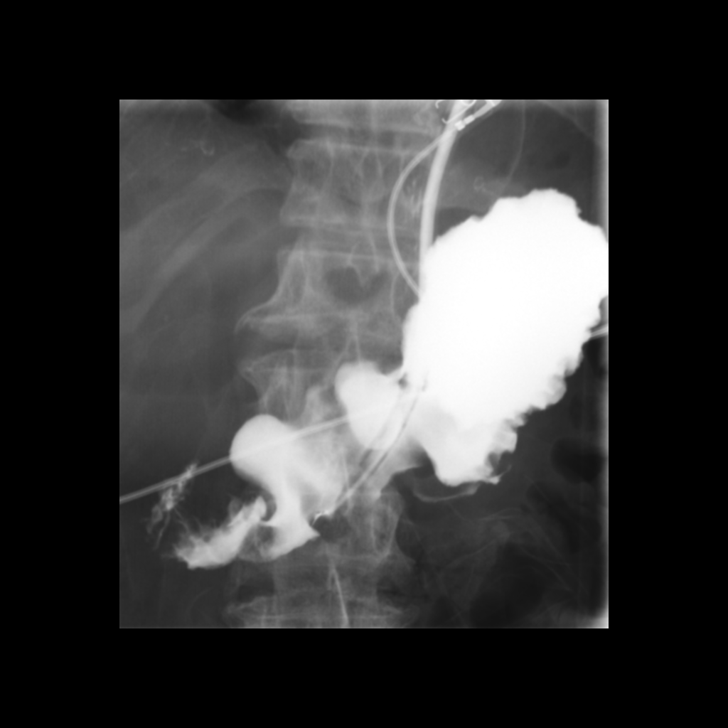

[Series 8: cp_standard · 0.35mm/px · 3 of 9 frames shown (2 of 6)]
[frame 2/9]
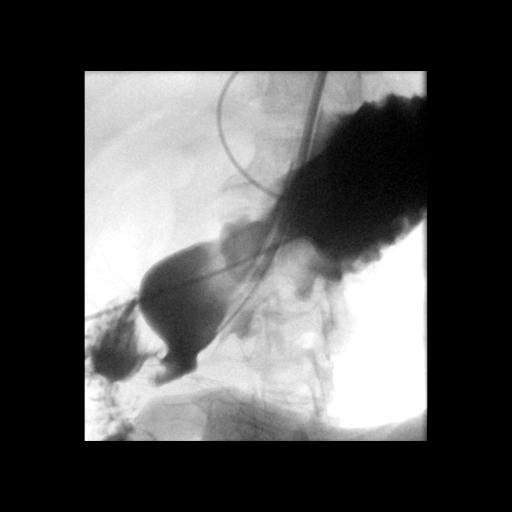
[frame 5/9]
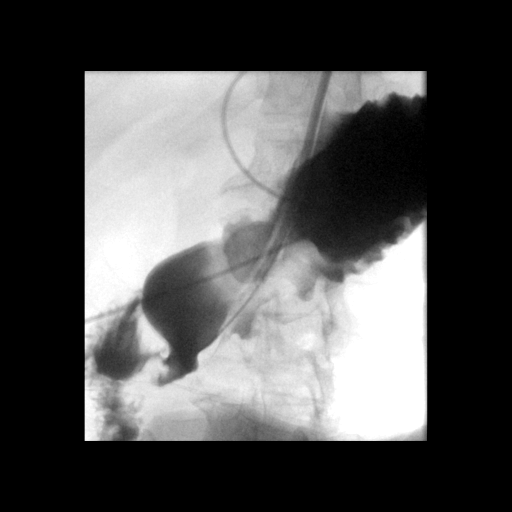
[frame 9/9]
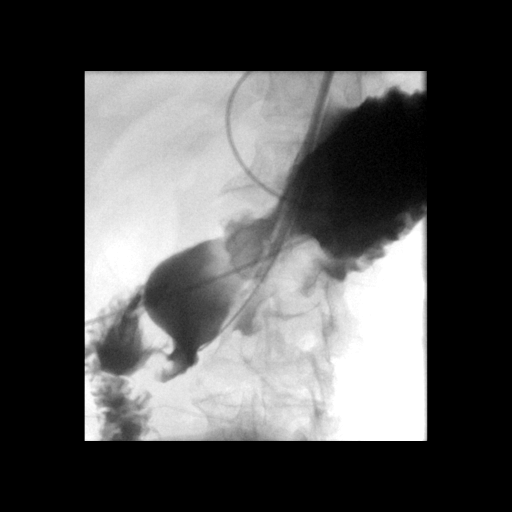

[Series 10: cp_standard · 0.36mm/px · 2 of 12 frames shown (3 of 6)]
[frame 7/12]
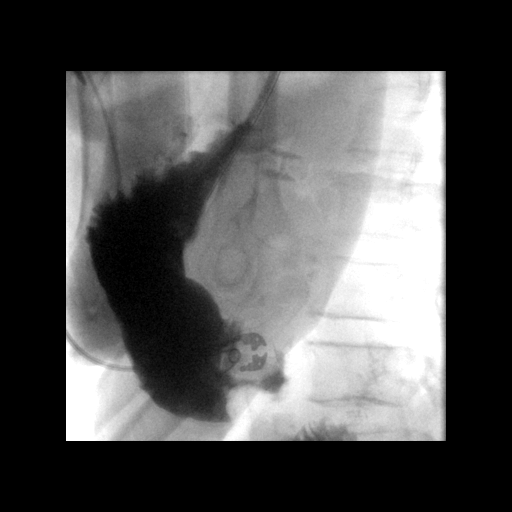
[frame 11/12]
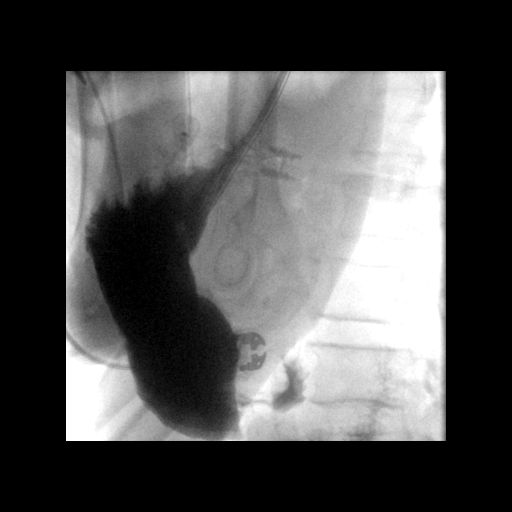

[Series 11: cp_standard · 0.36mm/px · 3 of 7 frames shown (4 of 6)]
[frame 1/7]
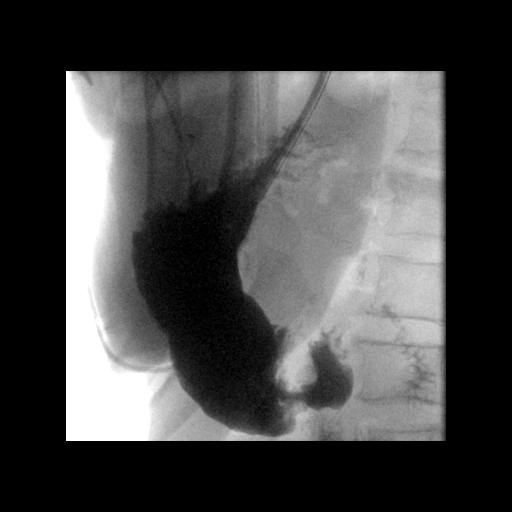
[frame 2/7]
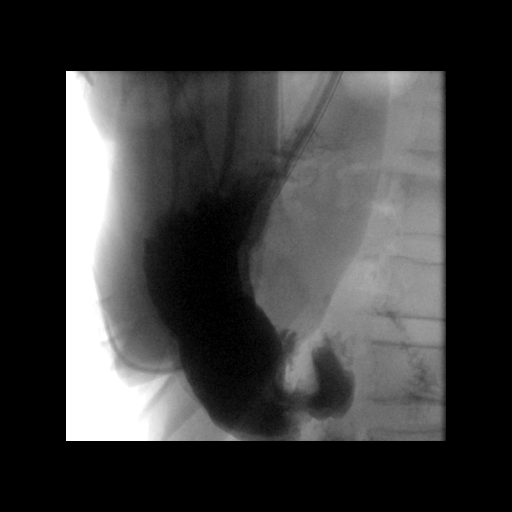
[frame 6/7]
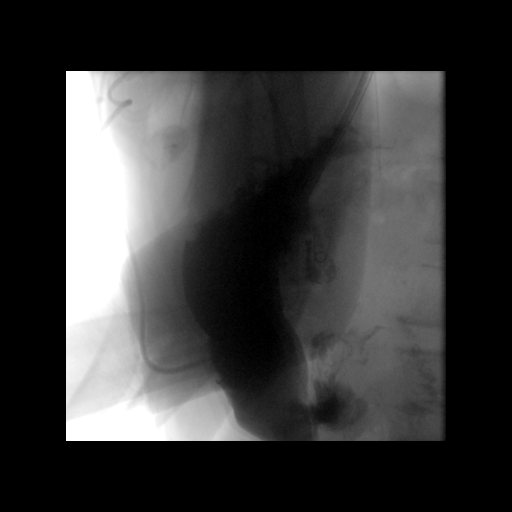

[Series 12: cp_standard · 0.36mm/px · 2 of 6 frames shown (5 of 6)]
[frame 4/6]
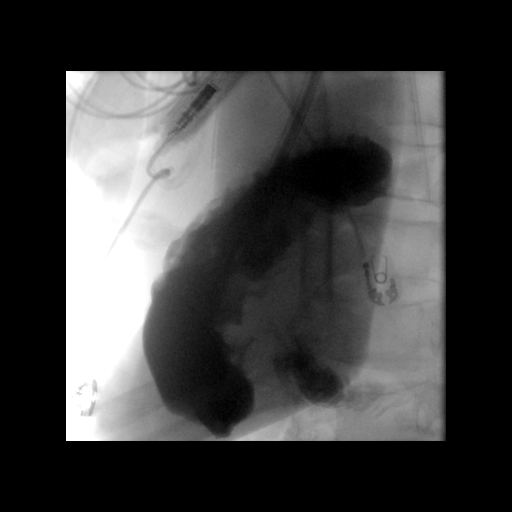
[frame 6/6]
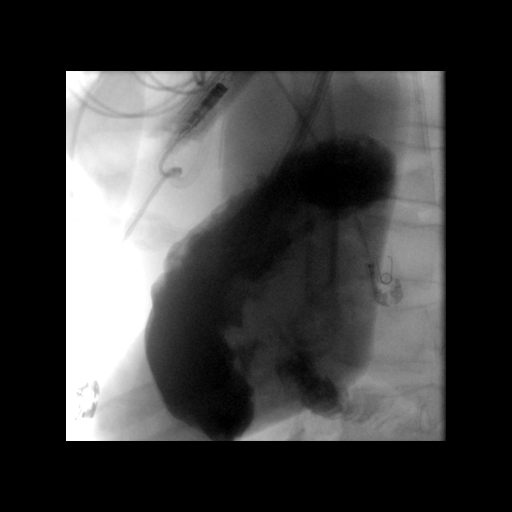

[Series 14: cp_standard · 0.18mm/px · 1 of 1 slices shown (6 of 6)]
[im 1/1]
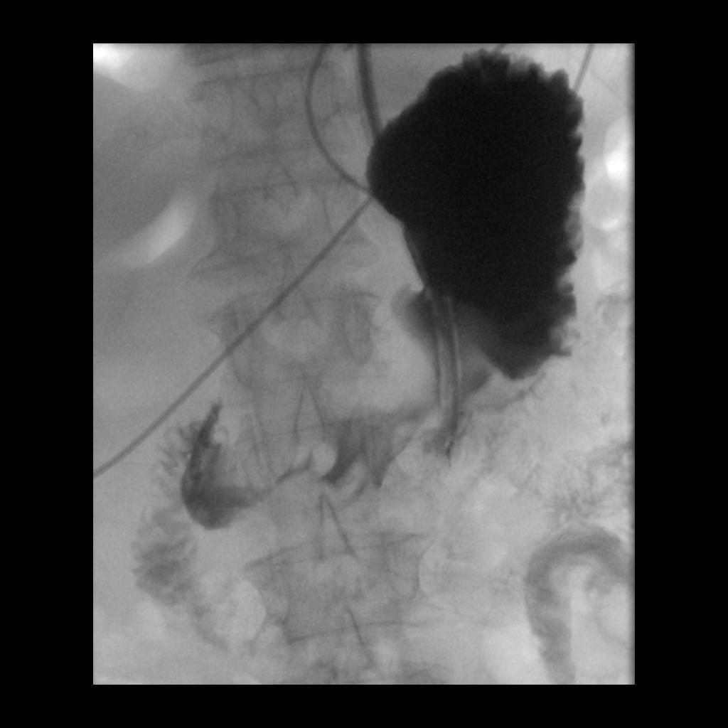

[15 of 24 positions shown; findings below may reference images not displayed]

FINDINGS: 120 cc of Omnipaque water-soluble contrast was introduced into the
stomach in the LPO position. The patient was turned supine and then
into the RPO position and subsequently into the RIGHT lateral
position. There is no gross extravasation of water-soluble contrast.
The patient during the examination was sedated due to medications
administered on the floor and mildly combative such that full
evaluation PO contrast administration was not possible.

Reflux was seen along the gastric tube to the level of the mid
esophagus during the evaluation.
IMPRESSION: Limited upper GI evaluation due to patient's mental status. No gross
contrast extravasation on water-soluble study with patient
positioning as described. If there is continued concern for ongoing
leak given mental status could consider administration of
water-soluble contrast and abdominal CT to exclude small leak not
visible on fluoroscopy limited by patient condition.

These results were called by telephone at the time of interpretation
on [DATE] at [DATE] to provider DREY , who verbally
acknowledged these results.

## 2020-01-14 MED ORDER — IOHEXOL 300 MG/ML  SOLN
150.0000 mL | Freq: Once | INTRAMUSCULAR | Status: AC | PRN
  Administered 2020-01-14: 150 mL

## 2020-01-14 MED ORDER — DEXTROSE 50 % IV SOLN
25.0000 mL | Freq: Once | INTRAVENOUS | Status: AC
  Administered 2020-01-14: 25 mL via INTRAVENOUS

## 2020-01-14 MED ORDER — POTASSIUM CHLORIDE 10 MEQ/100ML IV SOLN
10.0000 meq | INTRAVENOUS | Status: AC
  Administered 2020-01-14 (×4): 10 meq via INTRAVENOUS
  Filled 2020-01-14 (×5): qty 100

## 2020-01-14 MED ORDER — MAGNESIUM SULFATE 2 GM/50ML IV SOLN
2.0000 g | Freq: Once | INTRAVENOUS | Status: AC
  Administered 2020-01-14: 2 g via INTRAVENOUS
  Filled 2020-01-14: qty 50

## 2020-01-14 MED ORDER — MAGNESIUM SULFATE 50 % IJ SOLN: 2.0000 g | Freq: Once | INTRAMUSCULAR | Status: DC

## 2020-01-14 NOTE — TOC Progression Note (Signed)
Transition of Care (TOC) - Progression Note    Patient Details  Name: Tracy Barton MRN: [id number] Date of Birth: September 08, 1945  [facility] CM/SW Contact  Golda Acre, RN Phone Number: 01/14/2020, 9:23 AM  Clinical Narrative:    4 Days Post-Op   Subjective/Chief Complaint: Still with confusion No much out of NG   Objective: Vital signs in last 24 hours: Temp:  [97.1 F (36.2 C)-98.2 F (36.8 C)] 98 F (36.7 C) (01/12 0400) Pulse Rate:  [58-83] 66 (01/12 0800) Resp:  [8-28] 11 (01/12 0800) BP: (120-174)/(51-90) 153/71 (01/12 0800) SpO2:  [90 %-99 %] 96 % (01/12 0800) Last BM Date: 01/10/20  Intake/Output from previous day: 01/11 0701 - 01/12 0700 In: 2378.9 [I.V.:2279.2; IV Piggyback:99.7] Out: 1150 [Urine:900; Emesis/NG output:250] Intake/Output this shift: No intake/output data recorded.  Exam: Abdomen soft, Non-distended Will wake up and follow some commands Assessment/Plan: s/pProcedure(s): LAPAROSCOPY DIAGNOSTIC laparoscopic washout omental patch splenectomy (N/A) EXPLORATORY LAPAROTOMY (N/A)  Principal Problem: Perforated prepyloric gastric ulcer s/p omental Cheree Ditto patch 01/10/2020 Active Problems: Smoker Gastritis and gastroduodenitis Trigeminal neuralgia Hep C w/o coma, chronic (HCC) History of medication noncompliance ARF (acute renal failure) (HCC) S/P laparoscopic splenectomy  LOS: 4 days   UGI today to r/o leak If negative, will D/c NG and start po PLAN IS TO RETURN TO HOME WITH SELF CARE PROGRESSION: AS ABOVE   Expected Discharge Plan: Home/Self Care Barriers to Discharge: Continued Medical Work up  Expected Discharge Plan and Services Expected Discharge Plan: Home/Self Care   Discharge Planning Services: CM Consult   Living arrangements for the past 2 months: Apartment                                       Social Determinants of Health (SDOH) Interventions     Readmission Risk Interventions No flowsheet data found.

## 2020-01-14 NOTE — Progress Notes (Signed)
PROGRESS NOTE    Tracy Barton  PQZ:[id number] DOB: 1945/06/18 DOA: 01/10/2020 PCP: Patient, No Pcp Per   Brief Narrative: Tracy Barton is a 75 y.o. female with a history of hepatitic C infection, alcohol abuse. Patient presented secondary to abdominal pain and found to have a perforated gastric ulcer requiring emergent surgery.   Assessment & Plan:   Principal Problem:   Perforated prepyloric gastric ulcer s/p omental Tracy Barton patch 01/10/2020 Active Problems:   Smoker   Gastritis and gastroduodenitis   Trigeminal neuralgia   Hep C w/o coma, chronic (HCC)   History of medication noncompliance   ARF (acute renal failure) (HCC)   S/P laparoscopic splenectomy   Perforated gastric ulcer S/p splenectomy Patient underwent laparoscopic surgery on 1/8 for which patient underwent splenectomy, washout/lysis of adhesions and omental patch -Per primary -Recommend decreasing narcotic dosing secondary to age/weight/Ativan for CIWA  Alcohol abuse Alcohol withdrawal Moderately high CIWA scores overnight. Mostly agitation and disorientation. Unsure if post-op pain is playing a role; patient is on dilaudid prn for treatment of pain. Avoid antipsychotics in treatment of delirium since driver is alcohol withdrawal and continue with Ativan treatment -Continue CIWA -Continue thiamine. Folate, MVI not ordered secondary to NPO status  AKI Baseline creatinine appears to be around 1.1. Creatinine of 1.74 on admission in setting of acute abdomen. Improved with IV fluids. Creatinine of 0.81 today. Resolved.  Hypoglycemia Mild. Given dextrose IV fluids but with recurrent hypoglycemia while on LR fluids. Now on maintenance IV fluids with dextrose. Currently NPO per primary Recurrent hypoglycemia. Insulin coverage discontinued. Hemoglobin A1C of 5.0%. -Diet per primary -Continue CBG monitoring q 4 hours while NPO  Metabolic acidosis Elevated anion gap. Likely secondary to AKI.  Resolved.  Nutrition -Per priamry  Leukocytosis Likely reactive and secondary to recent surgery. Improving. -Daily CBC  Acute blood loss anemia Likely secondary to recent surgery. Improving. -CBC daily; transfuse as needed  Tobacco abuse -If patient desires, start nicotine patch   DVT prophylaxis: Per primary Code Status:   Code Status: Full Code Family Communication: None at bedside Disposition Plan: Per primary   Procedures:   LAPAROSCOPIC SURGERY (01/10/2020)  Antimicrobials:  Zosyn IV   Subjective: Agitation overnight. Combativeness. Appears to be in significant pain this morning as she is moaning a lot and wincing.  Objective: Vitals:   01/14/20 0100 01/14/20 0400 01/14/20 0500 01/14/20 0600  BP: 122/90 (!) 159/53 140/74 (!) 161/76  Pulse: 72 (!) 58 69 60  Resp: 19 11 13 16   Temp: (!) 97.4 F (36.3 C) 98 F (36.7 C)    TempSrc: Axillary Axillary    SpO2: 90% 97% 97% 99%  Weight:      Height:        Intake/Output Summary (Last 24 hours) at 01/14/2020 0806 Last data filed at 01/14/2020 0500 Gross per 24 hour  Intake 2378.9 ml  Output 1150 ml  Net 1228.9 ml   Filed Weights   01/10/20 1401 01/10/20 1901  Weight: 49 kg 38.7 kg    Examination:  General exam: Appears calm and comfortable. Thin appearing. Respiratory system: Diminished to auscultation. Respiratory effort normal. Cardiovascular system: S1 & S2 heard, RRR. No murmurs, rubs, gallops or clicks. Gastrointestinal system: Abdomen is nondistended, soft and seemingly tender as she groans weakly during exam. No organomegaly or masses felt. Normal bowel sounds heard. Central nervous system: Somnolent. Arouses briefly to tactile stimulation.  Musculoskeletal: No edema. No calf tenderness Skin: No cyanosis. No rashes Psychiatry: Judgement and insight  appear normal. Mood & affect appropriate.     Data Reviewed: I have personally reviewed following labs and imaging studies  CBC Lab Results   Component Value Date   WBC 13.0 (H) 01/13/2020   RBC 3.01 (L) 01/13/2020   HGB 9.0 (L) 01/13/2020   HCT 27.7 (L) 01/13/2020   MCV 92.0 01/13/2020   MCH 29.9 01/13/2020   PLT 201 01/13/2020   MCHC 32.5 01/13/2020   RDW 13.2 01/13/2020   LYMPHSABS 0.7 01/10/2020   MONOABS 0.1 01/10/2020   EOSABS 0.0 01/10/2020   BASOSABS 0.0 01/10/2020     Last metabolic panel Lab Results  Component Value Date   NA 144 01/13/2020   K 3.4 (L) 01/13/2020   CL 109 01/13/2020   CO2 29 01/13/2020   BUN 13 01/13/2020   CREATININE 0.81 01/13/2020   GLUCOSE 99 01/13/2020   GFRNONAA >60 01/13/2020   GFRAA 54 (L) 03/08/2019   CALCIUM 8.4 (L) 01/13/2020   PHOS 2.8 01/11/2020   PROT 6.9 01/10/2020   ALBUMIN 3.9 01/10/2020   BILITOT 1.0 01/10/2020   ALKPHOS 74 01/10/2020   AST 29 01/10/2020   ALT 8 01/10/2020   ANIONGAP 6 01/13/2020    CBG (last 3)  Recent Labs    01/13/20 2002 01/14/20 0113 01/14/20 0453  GLUCAP 83 92 79     GFR: Estimated Creatinine Clearance: 36.7 mL/min (by C-G formula based on SCr of 0.81 mg/dL).  Coagulation Profile: No results for input(s): INR, PROTIME in the last 168 hours.  Recent Results (from the past 240 hour(s))  Blood culture (routine x 2)     Status: None (Preliminary result)   Collection Time: 01/10/20  9:33 AM   Specimen: BLOOD  Result Value Ref Range Status   Specimen Description   Final    BLOOD RIGHT ANTECUBITAL Performed at [facility], [address], [place], [place] [address]    Special Requests   Final    BOTTLES DRAWN AEROBIC AND ANAEROBIC Blood Culture results may not be optimal due to an inadequate volume of blood received in culture bottles Performed at [facility], [address] [place], [place] [address]    Culture   Final    NO GROWTH 4 DAYS Performed at [facility], [address] [place], [place] [address]    Report Status PENDING  Incomplete  Blood culture (routine x 2)      Status: None (Preliminary result)   Collection Time: 01/10/20 10:09 AM   Specimen: BLOOD RIGHT FOREARM  Result Value Ref Range Status   Specimen Description   Final    BLOOD RIGHT FOREARM Performed at [facility], [address], [place], [place] [address]    Special Requests   Final    BOTTLES DRAWN AEROBIC AND ANAEROBIC Blood Culture results may not be optimal due to an inadequate volume of blood received in culture bottles Performed at [facility], [address] [place], [place] [address]    Culture   Final    NO GROWTH 4 DAYS Performed at [facility], [address] [place], [place] [address]    Report Status PENDING  Incomplete  Resp Panel by RT-PCR (Flu A&B, Covid) Nasopharyngeal Swab     Status: None   Collection Time: 01/10/20 11:09 AM   Specimen: Nasopharyngeal Swab; Nasopharyngeal(NP) swabs in vial transport medium  Result Value Ref Range Status   SARS Coronavirus 2 by RT PCR NEGATIVE  NEGATIVE Final    Comment: (NOTE) SARS-CoV-2 target nucleic acids are NOT DETECTED.  The SARS-CoV-2 RNA is generally detectable in upper respiratory specimens during the acute phase of infection. The lowest concentration of SARS-CoV-2 viral copies this assay can detect is 138 copies/mL. A negative result does not preclude SARS-Cov-2 infection and should not be used as the sole basis for treatment or other patient management decisions. A negative result may occur with  improper specimen collection/handling, submission of specimen other than nasopharyngeal swab, presence of viral mutation(s) within the areas targeted by this assay, and inadequate number of viral copies(<138 copies/mL). A negative result must be combined with clinical observations, patient history, and epidemiological information. The expected result is Negative.  Fact Sheet for Patients:  BloggerCourse.comhttps://www.fda.gov/media/152166/download  Fact Sheet for Healthcare Providers:   SeriousBroker.ithttps://www.fda.gov/media/152162/download  This test is no t yet approved or cleared by the [place] FDA and  has been authorized for detection and/or diagnosis of SARS-CoV-2 by FDA under an Emergency Use Authorization (EUA). This EUA will remain  in effect (meaning this test can be used) for the duration of the COVID-19 declaration under Section 564(b)(1) of the Act, 21 U.S.C.section 360bbb-3(b)(1), unless the authorization is terminated  or revoked sooner.       Influenza A by PCR NEGATIVE NEGATIVE Final   Influenza B by PCR NEGATIVE NEGATIVE Final    Comment: (NOTE) The Xpert Xpress SARS-CoV-2/FLU/RSV plus assay is intended as an aid in the diagnosis of influenza from Nasopharyngeal swab specimens and should not be used as a sole basis for treatment. Nasal washings and aspirates are unacceptable for Xpert Xpress SARS-CoV-2/FLU/RSV testing.  Fact Sheet for Patients: BloggerCourse.comhttps://www.fda.gov/media/152166/download  Fact Sheet for Healthcare Providers: SeriousBroker.ithttps://www.fda.gov/media/152162/download  This test is not yet approved or cleared by the [place] FDA and has been authorized for detection and/or diagnosis of SARS-CoV-2 by FDA under an Emergency Use Authorization (EUA). This EUA will remain in effect (meaning this test can be used) for the duration of the COVID-19 declaration under Section 564(b)(1) of the Act, 21 U.S.C. section 360bbb-3(b)(1), unless the authorization is terminated or revoked.  Performed at [facility], [address] [place], [place] [address]   MRSA PCR Screening     Status: None   Collection Time: 01/10/20 10:30 PM   Specimen: Nasal Mucosa; Nasopharyngeal  Result Value Ref Range Status   MRSA by PCR NEGATIVE NEGATIVE Final    Comment:        The GeneXpert MRSA Assay (FDA approved for NASAL specimens only), is one component of a comprehensive MRSA colonization surveillance program. It is not intended to diagnose  MRSA infection nor to guide or monitor treatment for MRSA infections. Performed at [facility], [address] [place], [place] [address]         Radiology Studies: US EKG SITE RITE  Result Date: 01/13/2020 If Site Rite image not attached, placement could not be confirmed due to current cardiac rhythm.       Scheduled Meds: . Chlorhexidine Gluconate Cloth  6 each Topical Daily  . heparin  5,000 Units Subcutaneous Q8H  . influenza vaccine adjuvanted  0.5 mL Intramuscular Tomorrow-1000  . lip balm  1 application Topical BID  . LORazepam  0-4 mg Intravenous Q4H   Followed by  . [START ON 01/15/2020] LORazepam  0-4 mg Intravenous Q8H  . mouth rinse  15 mL Mouth Rinse BID  . meningococcal oligosaccharide  0.5 mL Intramuscular Once  . pantoprazole  40  mg Intravenous Q12H  . sodium chloride flush  10-40 mL Intracatheter Q12H  . thiamine  100 mg Oral Daily   Or  . thiamine  100 mg Intravenous Daily   Continuous Infusions: . sodium chloride Stopped (01/13/20 0337)  . dextrose 5% lactated ringers 75 mL/hr at 01/14/20 0512  . methocarbamol (ROBAXIN) IV    . ondansetron (ZOFRAN) IV    . piperacillin-tazobactam (ZOSYN)  IV 3.375 g (01/14/20 0508)     LOS: 4 days     Jacquelin Hawking, MD [facility] 01/14/2020, 8:06 AM  If 7PM-7AM, please contact night-coverage [email or web address]

## 2020-01-14 NOTE — Progress Notes (Signed)
K 2.8, Dr. Caleb Popp notified. Notified Trixie Deis of drop in hgb from 9 to 7. Pt hypertensive w/ HR in the 80s. Upper GI study to be done and hgb recheck later this afternoon.

## 2020-01-14 NOTE — Progress Notes (Signed)
4 Days Post-Op   Subjective/Chief Complaint: Still with confusion No much out of NG   Objective: Vital signs in last 24 hours: Temp:  [97.1 F (36.2 C)-98.2 F (36.8 C)] 98 F (36.7 C) (01/12 0400) Pulse Rate:  [58-83] 66 (01/12 0800) Resp:  [8-28] 11 (01/12 0800) BP: (120-174)/(51-90) 153/71 (01/12 0800) SpO2:  [90 %-99 %] 96 % (01/12 0800) Last BM Date: 01/10/20  Intake/Output from previous day: 01/11 0701 - 01/12 0700 In: 2378.9 [I.V.:2279.2; IV Piggyback:99.7] Out: 1150 [Urine:900; Emesis/NG output:250] Intake/Output this shift: No intake/output data recorded.  Exam: Abdomen soft, Non-distended Will wake up and follow some commands  Lab Results:  Recent Labs    01/12/20 0143 01/13/20 0310  WBC 20.3* 13.0*  HGB 8.0* 9.0*  HCT 24.6* 27.7*  PLT 248 201   BMET Recent Labs    01/12/20 0143 01/13/20 0310  NA 141 144  K 3.8 3.4*  CL 105 109  CO2 26 29  GLUCOSE 132* 99  BUN 24* 13  CREATININE 1.15* 0.81  CALCIUM 8.3* 8.4*   PT/INR No results for input(s): LABPROT, INR in the last 72 hours. ABG No results for input(s): PHART, HCO3 in the last 72 hours.  Invalid input(s): PCO2, PO2  Studies/Results: [place] EKG SITE RITE  Result Date: 01/13/2020 If Site Rite image not attached, placement could not be confirmed due to current cardiac rhythm.   Anti-infectives: Anti-infectives (From admission, onward)   Start     Dose/Rate Route Frequency Ordered Stop   01/12/20 0600  piperacillin-tazobactam (ZOSYN) IVPB 3.375 g        3.375 g 12.5 mL/hr over 240 Minutes Intravenous Every 8 hours 01/12/20 0358     01/11/20 0000  piperacillin-tazobactam (ZOSYN) IVPB 2.25 g  Status:  Discontinued        2.25 g 100 mL/hr over 30 Minutes Intravenous Every 6 hours 01/10/20 2139 01/12/20 0358   01/10/20 2215  piperacillin-tazobactam (ZOSYN) IVPB 3.375 g  Status:  Discontinued        3.375 g 12.5 mL/hr over 240 Minutes Intravenous Every 8 hours 01/10/20 2122 01/10/20 2139    01/10/20 1200  cefoTEtan (CEFOTAN) 2 g in sodium chloride 0.9 % 100 mL IVPB        2 g 200 mL/hr over 30 Minutes Intravenous To [facility] 01/10/20 1133 01/11/20 0700   01/10/20 1115  piperacillin-tazobactam (ZOSYN) IVPB 3.375 g        3.375 g 100 mL/hr over 30 Minutes Intravenous  Once 01/10/20 1106 01/10/20 1200      Assessment/Plan: s/p Procedure(s): LAPAROSCOPY DIAGNOSTIC laparoscopic washout omental patch splenectomy (N/A) EXPLORATORY LAPAROTOMY (N/A)  Principal Problem: Perforated prepyloric gastric ulcer s/p omental Tracy Barton patch 01/10/2020 Active Problems: Smoker Gastritis and gastroduodenitis Trigeminal neuralgia Hep C w/o coma, chronic (HCC) History of medication noncompliance ARF (acute renal failure) (HCC) S/P laparoscopic splenectomy   LOS: 4 days   UGI today to r/o leak If negative, will D/c NG and start po  Tracy Barton 01/14/2020

## 2020-01-15 DIAGNOSIS — K251 Acute gastric ulcer with perforation: Secondary | ICD-10-CM | POA: Diagnosis not present

## 2020-01-15 DIAGNOSIS — E43 Unspecified severe protein-calorie malnutrition: Secondary | ICD-10-CM | POA: Diagnosis present

## 2020-01-15 DIAGNOSIS — N179 Acute kidney failure, unspecified: Secondary | ICD-10-CM | POA: Diagnosis not present

## 2020-01-15 LAB — GLUCOSE, CAPILLARY
Glucose-Capillary: 73 mg/dL (ref 70–99)
Glucose-Capillary: 90 mg/dL (ref 70–99)
Glucose-Capillary: 86 mg/dL (ref 70–99)
Glucose-Capillary: 72 mg/dL (ref 70–99)
Glucose-Capillary: 52 mg/dL — ABNORMAL LOW (ref 70–99)
Glucose-Capillary: 192 mg/dL — ABNORMAL HIGH (ref 70–99)
Glucose-Capillary: 145 mg/dL — ABNORMAL HIGH (ref 70–99)
Glucose-Capillary: 111 mg/dL — ABNORMAL HIGH (ref 70–99)

## 2020-01-15 LAB — CBC
HCT: 26.5 % — ABNORMAL LOW (ref 36.0–46.0)
Hemoglobin: 8.6 g/dL — ABNORMAL LOW (ref 12.0–15.0)
MCH: 30 pg (ref 26.0–34.0)
MCHC: 32.5 g/dL (ref 30.0–36.0)
MCV: 92.3 fL (ref 80.0–100.0)
Platelets: 425 10*3/uL — ABNORMAL HIGH (ref 150–400)
RBC: 2.87 MIL/uL — ABNORMAL LOW (ref 3.87–5.11)
RDW: 12.8 % (ref 11.5–15.5)
WBC: 8.9 10*3/uL (ref 4.0–10.5)
nRBC: 0.2 % (ref 0.0–0.2)

## 2020-01-15 LAB — COMPREHENSIVE METABOLIC PANEL
ALT: 17 U/L (ref 0–44)
AST: 32 U/L (ref 15–41)
Albumin: 2.5 g/dL — ABNORMAL LOW (ref 3.5–5.0)
Alkaline Phosphatase: 58 U/L (ref 38–126)
Anion gap: 12 (ref 5–15)
BUN: 6 mg/dL — ABNORMAL LOW (ref 8–23)
CO2: 30 mmol/L (ref 22–32)
Calcium: 8.6 mg/dL — ABNORMAL LOW (ref 8.9–10.3)
Chloride: 100 mmol/L (ref 98–111)
Creatinine, Ser: 0.71 mg/dL (ref 0.44–1.00)
GFR, Estimated: >60 mL/min (ref >60)
Glucose, Bld: 74 mg/dL (ref 70–99)
Potassium: 3.1 mmol/L — ABNORMAL LOW (ref 3.5–5.1)
Sodium: 142 mmol/L (ref 135–145)
Total Bilirubin: 0.7 mg/dL (ref 0.3–1.2)
Total Protein: 5.2 g/dL — ABNORMAL LOW (ref 6.5–8.1)

## 2020-01-15 LAB — CULTURE, BLOOD (ROUTINE X 2)
Culture: NO GROWTH
Culture: NO GROWTH

## 2020-01-15 LAB — PHOSPHORUS: Phosphorus: 1.3 mg/dL — ABNORMAL LOW (ref 2.5–4.6)

## 2020-01-15 LAB — BASIC METABOLIC PANEL
Anion gap: 11 (ref 5–15)
BUN: 6 mg/dL — ABNORMAL LOW (ref 8–23)
CO2: 30 mmol/L (ref 22–32)
Calcium: 8 mg/dL — ABNORMAL LOW (ref 8.9–10.3)
Chloride: 100 mmol/L (ref 98–111)
Creatinine, Ser: 0.67 mg/dL (ref 0.44–1.00)
GFR, Estimated: >60 mL/min (ref >60)
Glucose, Bld: 346 mg/dL — ABNORMAL HIGH (ref 70–99)
Potassium: 3 mmol/L — ABNORMAL LOW (ref 3.5–5.1)
Sodium: 141 mmol/L (ref 135–145)

## 2020-01-15 LAB — PREALBUMIN: Prealbumin: 7.3 mg/dL — ABNORMAL LOW (ref 18–38)

## 2020-01-15 LAB — MAGNESIUM: Magnesium: 1.7 mg/dL (ref 1.7–2.4)

## 2020-01-15 LAB — TRIGLYCERIDES: Triglycerides: 125 mg/dL (ref <150)

## 2020-01-15 LAB — H PYLORI, IGM, IGG, IGA AB
H Pylori IgG: 4.31 Index Value — ABNORMAL HIGH (ref 0.00–0.79)
H. Pylogi, Iga Abs: 17.9 units — ABNORMAL HIGH (ref 0.0–8.9)
H. Pylogi, Igm Abs: <9 units (ref 0.0–8.9)

## 2020-01-15 MED ORDER — POTASSIUM PHOSPHATES 15 MMOLE/5ML IV SOLN
10.0000 mmol | Freq: Once | INTRAVENOUS | Status: AC
  Administered 2020-01-15: 10 mmol via INTRAVENOUS
  Filled 2020-01-15 (×3): qty 3.33

## 2020-01-15 MED ORDER — POTASSIUM CHLORIDE 10 MEQ/100ML IV SOLN
10.0000 meq | INTRAVENOUS | Status: AC
Start: 2020-01-15 — End: 2020-01-15
  Administered 2020-01-15 (×6): 10 meq via INTRAVENOUS
  Filled 2020-01-15 (×6): qty 100

## 2020-01-15 MED ORDER — ENOXAPARIN SODIUM 30 MG/0.3ML ~~LOC~~ SOLN
30.0000 mg | SUBCUTANEOUS | Status: DC
  Administered 2020-01-15 – 2020-01-22 (×6): 30 mg via SUBCUTANEOUS
  Filled 2020-01-15 (×7): qty 0.3

## 2020-01-15 MED ORDER — TRACE MINERALS CU-MN-SE-ZN 300-55-60-3000 MCG/ML IV SOLN
INTRAVENOUS | Status: AC
  Filled 2020-01-15: qty 320

## 2020-01-15 MED ORDER — HYDROMORPHONE HCL 1 MG/ML IJ SOLN
0.5000 mg | INTRAMUSCULAR | Status: DC | PRN
  Administered 2020-01-15 – 2020-01-18 (×9): 0.5 mg via INTRAVENOUS
  Filled 2020-01-15: qty 1
  Filled 2020-01-15: qty 0.5
  Filled 2020-01-15 (×2): qty 1
  Filled 2020-01-15: qty 0.5
  Filled 2020-01-15 (×2): qty 1
  Filled 2020-01-15: qty 0.5
  Filled 2020-01-15: qty 1

## 2020-01-15 MED ORDER — DEXTROSE 50 % IV SOLN
INTRAVENOUS | Status: AC
  Administered 2020-01-15: 50 mL
  Filled 2020-01-15: qty 50

## 2020-01-15 MED ORDER — METHOCARBAMOL 1000 MG/10ML IJ SOLN
500.0000 mg | Freq: Three times a day (TID) | INTRAVENOUS | Status: DC
  Administered 2020-01-15 – 2020-01-19 (×13): 500 mg via INTRAVENOUS
  Filled 2020-01-15 (×13): qty 500

## 2020-01-15 MED ORDER — DEXTROSE IN LACTATED RINGERS 5 % IV SOLN: INTRAVENOUS | Status: DC

## 2020-01-15 MED ORDER — INSULIN ASPART 100 UNIT/ML ~~LOC~~ SOLN
0.0000 [IU] | Freq: Four times a day (QID) | SUBCUTANEOUS | Status: DC
  Administered 2020-01-16 – 2020-01-17 (×3): 1 [IU] via SUBCUTANEOUS

## 2020-01-15 MED ORDER — DEXTROSE 50 % IV SOLN: 25.0000 g | Freq: Once | INTRAVENOUS | Status: DC

## 2020-01-15 MED ORDER — HYDRALAZINE HCL 20 MG/ML IJ SOLN: 5.0000 mg | Freq: Three times a day (TID) | INTRAMUSCULAR | Status: DC | PRN

## 2020-01-15 NOTE — Plan of Care (Signed)
  Problem: Health Behavior/Discharge Planning: Goal: Ability to manage health-related needs will improve Outcome: Progressing   Problem: Clinical Measurements: Goal: Ability to maintain clinical measurements within normal limits will improve Outcome: Progressing Goal: Will remain free from infection Outcome: Progressing Goal: Diagnostic test results will improve Outcome: Progressing Goal: Respiratory complications will improve Outcome: Progressing Goal: Cardiovascular complication will be avoided Outcome: Progressing   Problem: Activity: Goal: Risk for activity intolerance will decrease Outcome: Progressing   Problem: Nutrition: Goal: Adequate nutrition will be maintained Outcome: Progressing   Problem: Coping: Goal: Level of anxiety will decrease Outcome: Progressing   Problem: Elimination: Goal: Will not experience complications related to bowel motility Outcome: Progressing Goal: Will not experience complications related to urinary retention Outcome: Progressing   Problem: Pain Managment: Goal: General experience of comfort will improve Outcome: Progressing   Problem: Safety: Goal: Ability to remain free from injury will improve Outcome: Progressing   Problem: Skin Integrity: Goal: Risk for impaired skin integrity will decrease Outcome: Progressing   Problem: Safety: Goal: Non-violent Restraint(s) Outcome: Progressing   Problem: Education: Goal: Required Educational Video(s) Outcome: Progressing   Problem: Clinical Measurements: Goal: Ability to maintain clinical measurements within normal limits will improve Outcome: Progressing Goal: Postoperative complications will be avoided or minimized Outcome: Progressing   Problem: Skin Integrity: Goal: Demonstration of wound healing without infection will improve Outcome: Progressing

## 2020-01-15 NOTE — Progress Notes (Incomplete)
Hypoglycemic Event  CBG: 52  Treatment:50 50% Dextrose injection Symptoms: None  Follow-up CBG: Time:*** CBG Result:***  Possible Reasons for Event: D5 not running because secondary was hooked up      [employer]

## 2020-01-15 NOTE — Progress Notes (Signed)
Hypoglycemic Event  CBG: 52 @ 1817  Treatment: 21mL  D50  Symptoms: none  Follow-up CBG: Time:1843 CBG Result:192  Possible Reasons for Event:D5 stopped to run secondary      [employer]

## 2020-01-15 NOTE — Progress Notes (Addendum)
PROGRESS NOTE    Tracy Barton  ZOX:[id number] DOB: 12/17/1945 DOA: 01/10/2020 PCP: Patient, No Pcp Per    Chief Complaint  Patient presents with  . Abdominal Pain    Brief Narrative:  Tracy Barton is a 75 y.o. female with a history of hepatitic C infection, alcohol abuse. Patient presented secondary to abdominal pain and found to have a perforated gastric ulcer requiring emergent surgery. Hospital course complicated by alcohol withdrawal symptoms.   Assessment & Plan:   Principal Problem:   Perforated prepyloric gastric ulcer s/p omental Cheree DittoGraham patch 01/10/2020 Active Problems:   Smoker   Gastritis and gastroduodenitis   Trigeminal neuralgia   Hep C w/o coma, chronic (HCC)   History of medication noncompliance   ARF (acute renal failure) (HCC)   S/P laparoscopic splenectomy   Protein-calorie malnutrition, severe   Perforated gastric ulcer; S/p splenectomy,  Washout/lysis of adhesions and omental patch.  Further management as per surgery. Pt currently on clear liquid diet and TPN.    Acute encephalopathy secondary to alcohol withdrawal in the setting of ? Dementia. Currently on HALDOL and ativan.   AKI:  - resolved    Hypokalemia:  Replaced.    Hypophosphatemia:  Replaced.    Anemia of blood loss / anemia of chronic disease:  Transfuse to keep hemoglobin greater than 8.   Nutrition:  She was started on TPN.    Metabolic acidosis:  -elevated anion gap.  Resolved.   Acute metabolic encephalopathy:  - probably from alcohol withdrawal and ativan.  - ativan d/ced today.    Hypertensive urgency.     DVT prophylaxis: Lovenox Code Status: (Full code) Family Communication: none at bedside Disposition:   Status is: Inpatient  Remains inpatient appropriate because:Ongoing diagnostic testing needed not appropriate for outpatient work up, Unsafe d/c plan and IV treatments appropriate due to intensity of illness or inability to take  PO   Dispo: The patient is from: Home              Anticipated d/c is to: pending.               Anticipated d/c date is: > 3 days              Patient currently is not medically stable to d/c.         Procedures: none  Antimicrobials: none  Subjective: confused   Objective: Vitals:   01/15/20 1200 01/15/20 1300 01/15/20 1400 01/15/20 1500  BP: (!) 144/125 (!) 146/89 (!) 154/88 (!) 161/96  Pulse: (!) 102 82 (!) 111 96  Resp: 14 15 15  (!) 27  Temp: (!) 97.5 F (36.4 C)     TempSrc: Axillary     SpO2: 99% 98% 99% 100%  Weight:   45.8 kg   Height:        Intake/Output Summary (Last 24 hours) at 01/15/2020 1625 Last data filed at 01/15/2020 1619 Gross per 24 hour  Intake 1941.74 ml  Output 2600 ml  Net -658.26 ml   Filed Weights   01/10/20 1401 01/10/20 1901 01/15/20 1400  Weight: 49 kg 38.7 kg 45.8 kg    Examination:  General exam: Appears calm and comfortable  Respiratory system: Clear to auscultation. Respiratory effort normal. Cardiovascular system: S1 & S2 heard, RRR. No JVD,  No pedal edema. Gastrointestinal system: Abdomen is soft, mildly tender and minimal bowel sounds.  Central nervous system: confused and agitated, able to move all extremities spontaneously.  Extremities: no pedal edema.  Skin: No rashes, lesions or ulcers Psychiatry: confused.    Data Reviewed: I have personally reviewed following labs and imaging studies  CBC: Recent Labs  Lab 01/10/20 0825 01/11/20 1005 01/12/20 0143 01/13/20 0310 01/14/20 0825 01/14/20 1435 01/15/20 0853  WBC 6.0   < > 20.3* 13.0* 8.0 9.0 8.9  NEUTROABS 5.1  --   --   --   --   --   --   HGB 15.3*   < > 8.0* 9.0* 7.0* 7.2* 8.6*  HCT 47.4*   < > 24.6* 27.7* 21.2* 21.9* 26.5*  MCV 93.5   < > 93.9 92.0 91.4 90.9 92.3  PLT 286   < > 248 201 318 330 425*   < > = values in this interval not displayed.    Basic Metabolic Panel: Recent Labs  Lab 01/11/20 1005 01/12/20 0143 01/13/20 0310  01/14/20 0825 01/15/20 0826 01/15/20 0853  NA  --  141 144 141 141 142  K  --  3.8 3.4* 2.8* 3.0* 3.1*  CL  --  105 109 103 100 100  CO2  --  26 29 30 30 30   GLUCOSE  --  132* 99 92 346* 74  BUN  --  24* 13 6* 6* 6*  CREATININE  --  1.15* 0.81 0.60 0.67 0.71  CALCIUM  --  8.3* 8.4* 8.2* 8.0* 8.6*  MG  --   --   --  1.3*  --  1.7  PHOS 2.8  --   --   --   --  1.3*    GFR: Estimated Creatinine Clearance: 43.9 mL/min (by C-G formula based on SCr of 0.71 mg/dL).  Liver Function Tests: Recent Labs  Lab 01/10/20 0825 01/15/20 0853  AST 29 32  ALT 8 17  ALKPHOS 74 58  BILITOT 1.0 0.7  PROT 6.9 5.2*  ALBUMIN 3.9 2.5*    CBG: Recent Labs  Lab 01/14/20 2331 01/15/20 0403 01/15/20 0806 01/15/20 1145 01/15/20 1540  GLUCAP 89 73 90 86 72     Recent Results (from the past 240 hour(s))  Blood culture (routine x 2)     Status: None   Collection Time: 01/10/20  9:33 AM   Specimen: BLOOD  Result Value Ref Range Status   Specimen Description   Final    BLOOD RIGHT ANTECUBITAL Performed at [facility], [address], [place], [place]    Special Requests   Final    BOTTLES DRAWN AEROBIC AND ANAEROBIC Blood Culture results may not be optimal due to an inadequate volume of blood received in culture bottles Performed at [facility], [address] [place], [place]    Culture   Final    NO GROWTH 5 DAYS Performed at [facility], [address] [place], [place]    Report Status 01/15/2020 FINAL  Final  Blood culture (routine x 2)     Status: None   Collection Time: 01/10/20 10:09 AM   Specimen: BLOOD RIGHT FOREARM  Result Value Ref Range Status   Specimen Description   Final    BLOOD RIGHT FOREARM Performed at [facility], [address] [place], [place]    Special Requests   Final    BOTTLES DRAWN AEROBIC AND ANAEROBIC Blood Culture results may not be optimal due to an  inadequate volume of blood received in culture bottles Performed at [facility], [address]  [place], [place] [address]    Culture   Final    NO GROWTH 5 DAYS Performed at [facility], [address], [place], [place] [address]    Report Status 01/15/2020 FINAL  Final  Resp Panel by RT-PCR (Flu A&B, Covid) Nasopharyngeal Swab     Status: None   Collection Time: 01/10/20 11:09 AM   Specimen: Nasopharyngeal Swab; Nasopharyngeal(NP) swabs in vial transport medium  Result Value Ref Range Status   SARS Coronavirus 2 by RT PCR NEGATIVE NEGATIVE Final    Comment: (NOTE) SARS-CoV-2 target nucleic acids are NOT DETECTED.  The SARS-CoV-2 RNA is generally detectable in upper respiratory specimens during the acute phase of infection. The lowest concentration of SARS-CoV-2 viral copies this assay can detect is 138 copies/mL. A negative result does not preclude SARS-Cov-2 infection and should not be used as the sole basis for treatment or other patient management decisions. A negative result may occur with  improper specimen collection/handling, submission of specimen other than nasopharyngeal swab, presence of viral mutation(s) within the areas targeted by this assay, and inadequate number of viral copies(<138 copies/mL). A negative result must be combined with clinical observations, patient history, and epidemiological information. The expected result is Negative.  Fact Sheet for Patients:  BloggerCourse.com  Fact Sheet for Healthcare Providers:  SeriousBroker.it  This test is no t yet approved or cleared by the [place] FDA and  has been authorized for detection and/or diagnosis of SARS-CoV-2 by FDA under an Emergency Use Authorization (EUA). This EUA will remain  in effect (meaning this test can be used) for the duration of the COVID-19 declaration under Section 564(b)(1) of the Act, 21 U.S.C.section  360bbb-3(b)(1), unless the authorization is terminated  or revoked sooner.       Influenza A by PCR NEGATIVE NEGATIVE Final   Influenza B by PCR NEGATIVE NEGATIVE Final    Comment: (NOTE) The Xpert Xpress SARS-CoV-2/FLU/RSV plus assay is intended as an aid in the diagnosis of influenza from Nasopharyngeal swab specimens and should not be used as a sole basis for treatment. Nasal washings and aspirates are unacceptable for Xpert Xpress SARS-CoV-2/FLU/RSV testing.  Fact Sheet for Patients: BloggerCourse.com  Fact Sheet for Healthcare Providers: SeriousBroker.it  This test is not yet approved or cleared by the [place] FDA and has been authorized for detection and/or diagnosis of SARS-CoV-2 by FDA under an Emergency Use Authorization (EUA). This EUA will remain in effect (meaning this test can be used) for the duration of the COVID-19 declaration under Section 564(b)(1) of the Act, 21 U.S.C. section 360bbb-3(b)(1), unless the authorization is terminated or revoked.  Performed at [facility], [address] [place], [place] [address]   MRSA PCR Screening     Status: None   Collection Time: 01/10/20 10:30 PM   Specimen: Nasal Mucosa; Nasopharyngeal  Result Value Ref Range Status   MRSA by PCR NEGATIVE NEGATIVE Final    Comment:        The GeneXpert MRSA Assay (FDA approved for NASAL specimens only), is one component of a comprehensive MRSA colonization surveillance program. It is not intended to diagnose MRSA infection nor to guide or monitor treatment for MRSA infections. Performed at [facility], [address] [place], [place] [address]          Radiology Studies: DG UGI W SINGLE CM (SOL OR THIN BA)  Result Date: 01/14/2020 CLINICAL DATA:  History of perforated gastric ulcer. EXAM: UPPER GI SERIES WITH KUB  TECHNIQUE: After obtaining a scout radiograph a routine upper  GI series was performed using water-soluble contrast through the existing NG tube. FLUOROSCOPY TIME:  Fluoroscopy Time:  2 minutes 30 seconds Radiation Exposure Index (if provided by the fluoroscopic device): 32.2 mGy Number of Acquired Spot Images: 4 COMPARISON:  January 11, 2020 FINDINGS: 120 cc of Omnipaque water-soluble contrast was introduced into the stomach in the LPO position. The patient was turned supine and then into the RPO position and subsequently into the RIGHT lateral position. There is no gross extravasation of water-soluble contrast. The patient during the examination was sedated due to medications administered on the floor and mildly combative such that full evaluation PO contrast administration was not possible. Reflux was seen along the gastric tube to the level of the mid esophagus during the evaluation. IMPRESSION: Limited upper GI evaluation due to patient's mental status. No gross contrast extravasation on water-soluble study with patient positioning as described. If there is continued concern for ongoing leak given mental status could consider administration of water-soluble contrast and abdominal CT to exclude small leak not visible on fluoroscopy limited by patient condition. These results were called by telephone at the time of interpretation on 01/14/2020 at 2:37 pm to provider [facility] , who verbally acknowledged these results. Electronically Signed   By: Donzetta Kohut M.D.   On: 01/14/2020 14:37        Scheduled Meds: . Chlorhexidine Gluconate Cloth  6 each Topical Daily  . enoxaparin (LOVENOX) injection  30 mg Subcutaneous Q24H  . influenza vaccine adjuvanted  0.5 mL Intramuscular Tomorrow-1000  . insulin aspart  0-9 Units Subcutaneous Q6H  . lip balm  1 application Topical BID  . mouth rinse  15 mL Mouth Rinse BID  . meningococcal oligosaccharide  0.5 mL Intramuscular Once  . pantoprazole  40 mg Intravenous Q12H  . sodium chloride flush  10-40 mL Intracatheter  Q12H  . thiamine  100 mg Oral Daily   Or  . thiamine  100 mg Intravenous Daily   Continuous Infusions: . sodium chloride Stopped (01/13/20 0337)  . dextrose 5% lactated ringers Stopped (01/15/20 1613)  . dextrose 5% lactated ringers    . methocarbamol (ROBAXIN) IV 100 mL/hr at 01/15/20 1619  . ondansetron (ZOFRAN) IV    . piperacillin-tazobactam (ZOSYN)  IV 12.5 mL/hr at 01/15/20 1619  . potassium chloride 100 mL/hr at 01/15/20 1619  . potassium PHOSPHATE IVPB (in mmol)    . TPN ADULT (ION)       LOS: 5 days        Kathlen Mody, MD [facility]   To contact the attending provider between 7A-7P or the covering provider during after hours 7P-7A, please log into the web site [email or web address] and access using universal [place] password for that web site. If you do not have the password, please call the hospital operator.  01/15/2020, 4:25 PM

## 2020-01-15 NOTE — Progress Notes (Signed)
Progress Note  5 Days Post-Op  Subjective: Patient very lethargic, opens eyes to sternal rub but did not acknowledge me or follow commands. Has been on scheduled ativan for alcohol withdrawal.   Objective: Vital signs in last 24 hours: Temp:  [97.6 F (36.4 C)-98.5 F (36.9 C)] 97.9 F (36.6 C) (01/12 2341) Pulse Rate:  [59-103] 67 (01/12 1900) Resp:  [11-26] 26 (01/12 1900) BP: (134-176)/(60-86) 156/76 (01/12 1900) SpO2:  [95 %-100 %] 99 % (01/12 1900) Last BM Date: 01/10/20  Intake/Output from previous day: 01/12 0701 - 01/13 0700 In: 1949.1 [I.V.:1598.7; IV Piggyback:350.4] Out: 2600 [Urine:2600] Intake/Output this shift: No intake/output data recorded.  PE: General: WD, cachectic female who is lethargic HEENT: temporal wasting Heart: regular, rate, and rhythm.   Lungs: CTAB, no wheezes, rhonchi, or rales noted.  Respiratory effort nonlabored Abd: soft, appropriately ttp, ND, +BS, incisions c/d/i, NGT with minimal output MS: all 4 extremities are symmetrical with no cyanosis, clubbing, or edema. Skin: warm and dry with no masses, lesions, or rashes    Lab Results:  Recent Labs    01/14/20 0825 01/14/20 1435  WBC 8.0 9.0  HGB 7.0* 7.2*  HCT 21.2* 21.9*  PLT 318 330   BMET Recent Labs    01/13/20 0310 01/14/20 0825  NA 144 141  K 3.4* 2.8*  CL 109 103  CO2 29 30  GLUCOSE 99 92  BUN 13 6*  CREATININE 0.81 0.60  CALCIUM 8.4* 8.2*   PT/INR No results for input(s): LABPROT, INR in the last 72 hours. CMP     Component Value Date/Time   NA 141 01/14/2020 0825   K 2.8 (L) 01/14/2020 0825   CL 103 01/14/2020 0825   CO2 30 01/14/2020 0825   GLUCOSE 92 01/14/2020 0825   BUN 6 (L) 01/14/2020 0825   CREATININE 0.60 01/14/2020 0825   CREATININE 0.86 09/21/2014 1214   CALCIUM 8.2 (L) 01/14/2020 0825   PROT 6.9 01/10/2020 0825   ALBUMIN 3.9 01/10/2020 0825   AST 29 01/10/2020 0825   ALT 8 01/10/2020 0825   ALKPHOS 74 01/10/2020 0825   BILITOT 1.0  01/10/2020 0825   GFRNONAA >60 01/14/2020 0825   GFRAA 54 (L) 03/08/2019 1548   Lipase     Component Value Date/Time   LIPASE 25 01/11/2020 0236       Studies/Results: DG UGI W SINGLE CM (SOL OR THIN BA)  Result Date: 01/14/2020 CLINICAL DATA:  History of perforated gastric ulcer. EXAM: UPPER GI SERIES WITH KUB TECHNIQUE: After obtaining a scout radiograph a routine upper GI series was performed using water-soluble contrast through the existing NG tube. FLUOROSCOPY TIME:  Fluoroscopy Time:  2 minutes 30 seconds Radiation Exposure Index (if provided by the fluoroscopic device): 32.2 mGy Number of Acquired Spot Images: 4 COMPARISON:  January 11, 2020 FINDINGS: 120 cc of Omnipaque water-soluble contrast was introduced into the stomach in the LPO position. The patient was turned supine and then into the RPO position and subsequently into the RIGHT lateral position. There is no gross extravasation of water-soluble contrast. The patient during the examination was sedated due to medications administered on the floor and mildly combative such that full evaluation PO contrast administration was not possible. Reflux was seen along the gastric tube to the level of the mid esophagus during the evaluation. IMPRESSION: Limited upper GI evaluation due to patient's mental status. No gross contrast extravasation on water-soluble study with patient positioning as described. If there is continued concern for ongoing  leak given mental status could consider administration of water-soluble contrast and abdominal CT to exclude small leak not visible on fluoroscopy limited by patient condition. These results were called by telephone at the time of interpretation on 01/14/2020 at 2:37 pm to provider [facility] , who verbally acknowledged these results. Electronically Signed   By: Donzetta Kohut M.D.   On: 01/14/2020 14:37   [place] EKG SITE RITE  Result Date: 01/13/2020 If Site Rite image not attached, placement could not  be confirmed due to current cardiac rhythm.   Anti-infectives: Anti-infectives (From admission, onward)   Start     Dose/Rate Route Frequency Ordered Stop   01/12/20 0600  piperacillin-tazobactam (ZOSYN) IVPB 3.375 g        3.375 g 12.5 mL/hr over 240 Minutes Intravenous Every 8 hours 01/12/20 0358     01/11/20 0000  piperacillin-tazobactam (ZOSYN) IVPB 2.25 g  Status:  Discontinued        2.25 g 100 mL/hr over 30 Minutes Intravenous Every 6 hours 01/10/20 2139 01/12/20 0358   01/10/20 2215  piperacillin-tazobactam (ZOSYN) IVPB 3.375 g  Status:  Discontinued        3.375 g 12.5 mL/hr over 240 Minutes Intravenous Every 8 hours 01/10/20 2122 01/10/20 2139   01/10/20 1200  cefoTEtan (CEFOTAN) 2 g in sodium chloride 0.9 % 100 mL IVPB        2 g 200 mL/hr over 30 Minutes Intravenous To [facility] 01/10/20 1133 01/11/20 0700   01/10/20 1115  piperacillin-tazobactam (ZOSYN) IVPB 3.375 g        3.375 g 100 mL/hr over 30 Minutes Intravenous  Once 01/10/20 1106 01/10/20 1200       Assessment/Plan Tobacco abuse Alcohol abuse Hx of trigeminal neuralgia  Chronic Hepatitis C Severe protein calorie malnutrition   Perforated gastric ulcer S/p laparoscopic graham patch, laparoscopic splenectomy 01/10/20 Dr. [name] - POD#5 - UGI not suggestive of leak yesterday and NGT output has remained low - d/c NGT today and allow clears if patient cleared by SLP - d/c scheduled ativan today and ordered PT/OT, pt very lethargic this AM - labs pending, if hgb <7.0 will need to transfuse - H. Pylori pending   ABL anemia - hgb 7.2 yesterday afternoon, recheck this AM, hold SQ heparin   FEN: ok to have CLD if SLP clears and more alert later, IVF VTE: SQ heparin on hold given ABL anemia  ID: Zosyn 1/8>>   LOS: 5 days    Juliet Rude , [facility] 01/15/2020, 8:05 AM Please see Amion for pager number during day hours 7:00am-4:30pm

## 2020-01-15 NOTE — Progress Notes (Signed)
Initial Nutrition Assessment  DOCUMENTATION CODES:   Underweight,Severe malnutrition in context of chronic illness  INTERVENTION:  - if small bore NGT able to be placed, recommend initiation of TF and to hold off on TPN. - TF recommendations: Osmolite 1.2 @ 25 ml/hr to advance by 10 ml every 24 hours to reach goal rate of 55 ml/hr with 45 ml Prosource TF once/day. - at goal rate, this regimen will provide 1624 kcal, 84 grams protein, and 1082 ml free water.  - weigh patient today.    Monitor magnesium, potassium, and phosphorus daily for at least 3 days, MD to replete as needed, as pt is at risk for refeeding syndrome given severe malnutrition, no nutrition for >5 days, current hypokalemia and hypophosphatemia.   NUTRITION DIAGNOSIS:   Severe Malnutrition related to chronic illness as evidenced by severe fat depletion,severe muscle depletion.  GOAL:   Patient will meet greater than or equal to 90% of their needs  MONITOR:   Diet advancement,Labs,Weight trends,Skin,Other (Comment) (plan concerning nutrition support)  REASON FOR ASSESSMENT:   Consult New TPN/TNA  ASSESSMENT:   75 year-old female with medical history of neuromuscular disorder, gastritis, and depression. She presented to the ED due to severe abdominal pain. CT abdomen indicated numerous pockets of pneumoperitoneum of uncertain etiology. General Surgery consulted.  Patient has been NPO since admission until diet advancement to CLD today at 0750.    She is POD #5 lap graham patch, lap splenectomy. NGT was placed for suction 1/8, had to be replaced 1/9, and was removed earlier today.   Patient is noted to be a/o to self only. She was laying in bed, hunched over and laying on pillow on her knees. No family/visitors present, but patient was crying and stating she wanted to see her brother. She was unable to provide any information.  Able to talk with Pharmacist and RN. Pending input from Surgery about possibility of  replacing NGT in order to initiate TF rather than initiation of TPN.   RN reports that patient's mentation has made it such that she is not safe for PO at this time.   Weight on 1/8 was documented as both 85 lb and 108 lb. PTA, the most recently documented weight was on 09/11/14 when she weighed 108 lb.     Labs reviewed; K: 3.1 mmol/l, BUN: 6 mg/dl, Ca: 8.6 mg/dl, Phos: 1.3 mg/dl.  Medications reviewed; 2 g IV Mg sulfate x1 run 1/12, 40 mg IV protonix BID, 10 mEq IV KCl x6 runs 1/13, 10 mmol IV KPhos x1 run 1/13, 100 mg IV thiamine/day. IVF; D5-LR @ 75 ml/hr (306 kcal).     NUTRITION - FOCUSED PHYSICAL EXAM:  Flowsheet Row Most Recent Value  Orbital Region Moderate depletion  Upper Arm Region Severe depletion  Thoracic and Lumbar Region Severe depletion  Buccal Region Severe depletion  Temple Region Severe depletion  Clavicle Bone Region Severe depletion  Clavicle and Acromion Bone Region Severe depletion  Scapular Bone Region Severe depletion  Dorsal Hand Moderate depletion  Patellar Region Severe depletion  Anterior Thigh Region Severe depletion  Posterior Calf Region Severe depletion  Edema (RD Assessment) None  Hair Reviewed  Eyes Unable to assess  Mouth Unable to assess  Skin Reviewed  Nails Reviewed       Diet Order:   Diet Order            Diet clear liquid Room service appropriate? Yes; Fluid consistency: Thin  Diet effective now  EDUCATION NEEDS:   Not appropriate for education at this time  Skin:  Skin Assessment: Skin Integrity Issues: Skin Integrity Issues:: Incisions,Other (Comment) Incisions: abdomen (1/8) Other: skin tear to L wrist  Last BM:  PTA/unknown  Height:   Ht Readings from Last 1 Encounters:  01/10/20 5\' 4"  (1.626 m)    Weight:   Wt Readings from Last 1 Encounters:  01/10/20 38.7 kg    Estimated Nutritional Needs:  Kcal:  1450-1650 kcal Protein:  75-90 grams Fluid:  >/= 1.7 L/day     03/09/20,  MS, RD, LDN, CNSC Inpatient Clinical Dietitian RD pager # available in AMION  After hours/weekend pager # available in [facility]

## 2020-01-15 NOTE — Progress Notes (Signed)
PHARMACY - TOTAL PARENTERAL NUTRITION CONSULT NOTE   Indication: s/p graham patch, malnourished, lethargic  Patient Measurements: Height: 5\' 4"  (162.6 cm) Weight: 38.7 kg (85 lb 5.1 oz) IBW/kg (Calculated) : 54.7 TPN AdjBW (KG): 38.7 Body mass index is 14.64 kg/m. Usual Weight:   Assessment: 71 yoF admitted on 1/8 with perforated gastric ulcer s/p laparoscopic graham patch, laparoscopic splenectomy 01/10/20.  UGI not suggestive of leak and NGT output has remained low.  Pharmacy is consulted to start TPN for malnutrition.  Note risk for re-feeding sydrome d/t poor PO intake. Per CCS, allow clears if patient cleared by SLP.  Discussed using enteral nutrition preferentially with 03/09/20, PA instead of TPN, however, NGT has already been removed.  She is very lethargic (benzodiazepines for alcohol withdrawal) and is not expected to be able to meet nutritional needs with PO intake.  Glucose / Insulin: CBGs low 80-90s.  No known history of diabetes.   Electrolytes: K 3.1, Phos 1.3, others WNL including CorrCa 9.8 Renal: SCr and BUN low LFTs / TGs: LFTs wnl; TG 125 Prealbumin / albumin: Albumin 2.5; Prealbumin 7.3 Intake / Output; MIVF: 2.6 L UOP, total I/O - 694mL/day.  D5-LR @ 75 ml/hr. GI Imaging:  Surgeries / Procedures: 1/8 laparoscopic graham patch, splenectomy  Central access: PICC (placed 1/11) TPN start date: 1/13  Nutritional Goals (per RD recommendation on 1/13): Kcal:  1450-1650 kcal Protein:  75-90 grams Fluid:  >/= 1.7 L/day Goal TPN rate is 70 mL/hr (provides 84 g of protein and 1616 kcals per day)  Current Nutrition:  Clear liquids and TPN  Plan:  Now: Potassium Chloride and Potassium Phosphate IV per MD.  At 18:00 Start TPN at 40 mL/hr Standard Electrolytes in TPN: 65mEq/L of Na, 97mEq/L of K, 32mEq/L of Ca, 21mEq/L of Mg, and 49mmol/L of Phos. Cl:Ac ratio 1:1 Add standard MVI and trace elements to TPN Add Sensitive SSI and CBGs q6h Reduce MIVF to 35 mL/hr at  1800 Monitor TPN labs on Mon/Thurs.  BMET, Mag and Phos in AM. Recommend to use enteral nutrition if/when safe to do so.     12m PharmD, BCPS Clinical Pharmacist [facility] main pharmacy [phone] 01/15/2020 10:32 AM

## 2020-01-15 NOTE — Evaluation (Signed)
SLP Cancellation Note  Patient Details Name: Tracy Barton MRN: [id number] DOB: 06-12-1945   Cancelled treatment:       Reason Eval/Treat Not Completed: Other (comment) (pt awake in bed, does not follow directions nor is she willing to accept po intake, pt is awake and has clear voice but refused all po, will continue efforts)  [name], MS [facility] SLP Acute Rehab Services Office [phone] Pager [phone]   [name] 01/15/2020, 3:03 PM

## 2020-01-16 DIAGNOSIS — K251 Acute gastric ulcer with perforation: Secondary | ICD-10-CM | POA: Diagnosis not present

## 2020-01-16 DIAGNOSIS — N179 Acute kidney failure, unspecified: Secondary | ICD-10-CM | POA: Diagnosis not present

## 2020-01-16 LAB — BASIC METABOLIC PANEL
Anion gap: 9 (ref 5–15)
BUN: 9 mg/dL (ref 8–23)
CO2: 26 mmol/L (ref 22–32)
Calcium: 8.4 mg/dL — ABNORMAL LOW (ref 8.9–10.3)
Chloride: 103 mmol/L (ref 98–111)
Creatinine, Ser: 0.59 mg/dL (ref 0.44–1.00)
GFR, Estimated: >60 mL/min (ref >60)
Glucose, Bld: 108 mg/dL — ABNORMAL HIGH (ref 70–99)
Potassium: 3.7 mmol/L (ref 3.5–5.1)
Sodium: 138 mmol/L (ref 135–145)

## 2020-01-16 LAB — GLUCOSE, CAPILLARY
Glucose-Capillary: 103 mg/dL — ABNORMAL HIGH (ref 70–99)
Glucose-Capillary: 103 mg/dL — ABNORMAL HIGH (ref 70–99)
Glucose-Capillary: 109 mg/dL — ABNORMAL HIGH (ref 70–99)
Glucose-Capillary: 118 mg/dL — ABNORMAL HIGH (ref 70–99)
Glucose-Capillary: 118 mg/dL — ABNORMAL HIGH (ref 70–99)
Glucose-Capillary: 124 mg/dL — ABNORMAL HIGH (ref 70–99)
Glucose-Capillary: 129 mg/dL — ABNORMAL HIGH (ref 70–99)
Glucose-Capillary: 131 mg/dL — ABNORMAL HIGH (ref 70–99)

## 2020-01-16 LAB — CBC
HCT: 20.6 % — ABNORMAL LOW (ref 36.0–46.0)
Hemoglobin: 6.8 g/dL — CL (ref 12.0–15.0)
MCH: 30 pg (ref 26.0–34.0)
MCHC: 33 g/dL (ref 30.0–36.0)
MCV: 90.7 fL (ref 80.0–100.0)
Platelets: 443 10*3/uL — ABNORMAL HIGH (ref 150–400)
RBC: 2.27 MIL/uL — ABNORMAL LOW (ref 3.87–5.11)
RDW: 12.8 % (ref 11.5–15.5)
WBC: 8.9 10*3/uL (ref 4.0–10.5)
nRBC: 0.6 % — ABNORMAL HIGH (ref 0.0–0.2)

## 2020-01-16 LAB — PREPARE RBC (CROSSMATCH)

## 2020-01-16 LAB — HEMOGLOBIN AND HEMATOCRIT, BLOOD
HCT: 26.6 % — ABNORMAL LOW (ref 36.0–46.0)
Hemoglobin: 8.7 g/dL — ABNORMAL LOW (ref 12.0–15.0)

## 2020-01-16 LAB — PHOSPHORUS: Phosphorus: 2.2 mg/dL — ABNORMAL LOW (ref 2.5–4.6)

## 2020-01-16 LAB — MAGNESIUM: Magnesium: 1.5 mg/dL — ABNORMAL LOW (ref 1.7–2.4)

## 2020-01-16 MED ORDER — MAGNESIUM SULFATE 4 GM/100ML IV SOLN
4.0000 g | Freq: Once | INTRAVENOUS | Status: AC
  Administered 2020-01-16: 4 g via INTRAVENOUS
  Filled 2020-01-16: qty 100

## 2020-01-16 MED ORDER — POTASSIUM CHLORIDE 10 MEQ/50ML IV SOLN
10.0000 meq | Freq: Once | INTRAVENOUS | Status: AC
  Administered 2020-01-16: 10 meq via INTRAVENOUS
  Filled 2020-01-16: qty 50

## 2020-01-16 MED ORDER — HYDRALAZINE HCL 20 MG/ML IJ SOLN
10.0000 mg | Freq: Three times a day (TID) | INTRAMUSCULAR | Status: DC | PRN
  Administered 2020-01-16: 10 mg via INTRAVENOUS
  Filled 2020-01-16: qty 1

## 2020-01-16 MED ORDER — POTASSIUM PHOSPHATES 15 MMOLE/5ML IV SOLN
10.0000 mmol | Freq: Once | INTRAVENOUS | Status: AC
  Administered 2020-01-16: 10 mmol via INTRAVENOUS
  Filled 2020-01-16: qty 3.33

## 2020-01-16 MED ORDER — TRACE MINERALS CU-MN-SE-ZN 300-55-60-3000 MCG/ML IV SOLN
INTRAVENOUS | Status: AC
  Filled 2020-01-16: qty 320

## 2020-01-16 MED ORDER — SODIUM CHLORIDE 0.9% IV SOLUTION: Freq: Once | INTRAVENOUS | Status: AC

## 2020-01-16 MED ORDER — SODIUM CHLORIDE 0.9% IV SOLUTION: Freq: Once | INTRAVENOUS | Status: DC

## 2020-01-16 NOTE — Progress Notes (Signed)
Pt irritable and verbally aggressive, refusing CBG check and general care. Pt oriented to self, but disoriented to place and situation. Pt remains in bed but screams for "help me", "leave me alone", and "why are you looking at me?"

## 2020-01-16 NOTE — Plan of Care (Signed)
  Problem: Pain Managment: Goal: General experience of comfort will improve Outcome: Progressing   Problem: Safety: Goal: Ability to remain free from injury will improve Outcome: Progressing   Problem: Skin Integrity: Goal: Risk for impaired skin integrity will decrease Outcome: Progressing   

## 2020-01-16 NOTE — Progress Notes (Signed)
Awake sreaming yelling and fighting trying to cleam up.

## 2020-01-16 NOTE — Progress Notes (Signed)
PROGRESS NOTE    Tracy Barton  EPP:[id number] DOB: 08-09-1945 DOA: 01/10/2020 PCP: Patient, No Pcp Per    Chief Complaint  Patient presents with  . Abdominal Pain    Brief Narrative:  Tracy Barton is a 74 y.o. female with a history of hepatitic C infection, alcohol abuse. Patient presented secondary to abdominal pain and found to have a perforated gastric ulcer requiring emergent surgery. Hospital course complicated by alcohol withdrawal symptoms. Pt is currently on prn haldol.   Assessment & Plan:   Principal Problem:   Perforated prepyloric gastric ulcer s/p omental Cheree Ditto patch 01/10/2020 Active Problems:   Smoker   Gastritis and gastroduodenitis   Trigeminal neuralgia   Hep C w/o coma, chronic (HCC)   History of medication noncompliance   ARF (acute renal failure) (HCC)   S/P laparoscopic splenectomy   Protein-calorie malnutrition, severe   Perforated gastric ulcer; S/p splenectomy,  Washout/lysis of adhesions and omental patch.  Further management as per surgery. Pt currently on clear liquid diet and TPN.    Acute encephalopathy secondary to alcohol withdrawal in the setting of ? Dementia. At home pt is on atarax for anxiety?  Currently she is on IV haldol  Ativan was discontinued as she became sedated from IV ativan.  Pt continues to remain confused , try to minimize the use of IV dilaudid , which might be making her confused.   AKI:  - resolved    Hypokalemia:  Replaced.    Hypophosphatemia:  Replaced.    Acute Anemia of blood loss  From surgery superimposed on anemia of chronic disease:  Hemoglobin of 6.8 this am, 1 unit of prbc transfusion ordered by general surgery.  Transfuse to keep hemoglobin greater than 8.   Nutrition:  She was started on TPN.    Metabolic acidosis:  -elevated anion gap.  Resolved.   Acute metabolic encephalopathy:  - probably from alcohol withdrawal and ativan and dilaudid.  - ativan d/ced today. Try to  minimize the use of dilaudid.  - ? Hospital delirium.    Hypertensive urgency.  On IV hydralazine prn, and metoprolol prn.  Pt not on meds at home prior to admission.  Once she is able to take orally, we will start her on oral anti hypertensives.     DVT prophylaxis: Lovenox Code Status: (Full code) Family Communication: none at bedside Disposition:  Per primary service.    Antimicrobials:   Subjective:  Pt continues to be confused.   Objective: Vitals:   01/16/20 1116 01/16/20 1200 01/16/20 1300 01/16/20 1337  BP: (!) 151/66 (!) 177/93 (!) 168/113 (!) 206/87  Pulse: 66 89 64 77  Resp: 13 13 12 15   Temp: 98.2 F (36.8 C)   98.6 F (37 C)  TempSrc: Oral   Oral  SpO2: 99% 100% 96% 98%  Weight:      Height:        Intake/Output Summary (Last 24 hours) at 01/16/2020 1646 Last data filed at 01/16/2020 1416 Gross per 24 hour  Intake 2647.52 ml  Output 1975 ml  Net 672.52 ml   Filed Weights   01/10/20 1401 01/10/20 1901 01/15/20 1400  Weight: 49 kg 38.7 kg 45.8 kg    Examination:  General exam: not in distress.  Respiratory system: air entry fair, not in distress.  Cardiovascular system: S1S2 heard, RRR,  Gastrointestinal system: Abdomen is soft, mildly tender, minimal bowel sounds.  Central nervous system: confused, but not agitated , able to move all extremities.  Extremities: no cyanosis or clubbing.  Skin: no rashes seen Psychiatry: cannot be assessed.    Data Reviewed: I have personally reviewed following labs and imaging studies  CBC: Recent Labs  Lab 01/10/20 0825 01/11/20 1005 01/13/20 0310 01/14/20 0825 01/14/20 1435 01/15/20 0853 01/16/20 0733  WBC 6.0   < > 13.0* 8.0 9.0 8.9 8.9  NEUTROABS 5.1  --   --   --   --   --   --   HGB 15.3*   < > 9.0* 7.0* 7.2* 8.6* 6.8*  HCT 47.4*   < > 27.7* 21.2* 21.9* 26.5* 20.6*  MCV 93.5   < > 92.0 91.4 90.9 92.3 90.7  PLT 286   < > 201 318 330 425* 443*   < > = values in this interval not displayed.     Basic Metabolic Panel: Recent Labs  Lab 01/11/20 1005 01/12/20 0143 01/13/20 0310 01/14/20 0825 01/15/20 0826 01/15/20 0853 01/16/20 0733  NA  --    < > 144 141 141 142 138  K  --    < > 3.4* 2.8* 3.0* 3.1* 3.7  CL  --    < > 109 103 100 100 103  CO2  --    < > 29 30 30 30 26   GLUCOSE  --    < > 99 92 346* 74 108*  BUN  --    < > 13 6* 6* 6* 9  CREATININE  --    < > 0.81 0.60 0.67 0.71 0.59  CALCIUM  --    < > 8.4* 8.2* 8.0* 8.6* 8.4*  MG  --   --   --  1.3*  --  1.7 1.5*  PHOS 2.8  --   --   --   --  1.3* 2.2*   < > = values in this interval not displayed.    GFR: Estimated Creatinine Clearance: 43.9 mL/min (by C-G formula based on SCr of 0.59 mg/dL).  Liver Function Tests: Recent Labs  Lab 01/10/20 0825 01/15/20 0853  AST 29 32  ALT 8 17  ALKPHOS 74 58  BILITOT 1.0 0.7  PROT 6.9 5.2*  ALBUMIN 3.9 2.5*    CBG: Recent Labs  Lab 01/15/20 2321 01/16/20 0324 01/16/20 0553 01/16/20 0832 01/16/20 1214  GLUCAP 111* 129* 103* 118* 124*     Recent Results (from the past 240 hour(s))  Blood culture (routine x 2)     Status: None   Collection Time: 01/10/20  9:33 AM   Specimen: BLOOD  Result Value Ref Range Status   Specimen Description   Final    BLOOD RIGHT ANTECUBITAL Performed at [facility], [address], [place], [place]    Special Requests   Final    BOTTLES DRAWN AEROBIC AND ANAEROBIC Blood Culture results may not be optimal due to an inadequate volume of blood received in culture bottles Performed at [facility], [address], [place], [place]    Culture   Final    NO GROWTH 5 DAYS Performed at [facility], [address], [place], [place]    Report Status 01/15/2020 FINAL  Final  Blood culture (routine x 2)     Status: None   Collection Time: 01/10/20 10:09 AM   Specimen: BLOOD RIGHT FOREARM  Result Value Ref Range Status   Specimen Description   Final     BLOOD RIGHT FOREARM Performed at [facility], [address], [place], [place] [address]    Special Requests   Final    BOTTLES DRAWN AEROBIC AND ANAEROBIC Blood Culture results may not be optimal due to an inadequate volume of blood received in culture bottles Performed at [facility], [address] [place], [place] [address]    Culture   Final    NO GROWTH 5 DAYS Performed at [facility], [address], [place], [place] [address]    Report Status 01/15/2020 FINAL  Final  Resp Panel by RT-PCR (Flu A&B, Covid) Nasopharyngeal Swab     Status: None   Collection Time: 01/10/20 11:09 AM   Specimen: Nasopharyngeal Swab; Nasopharyngeal(NP) swabs in vial transport medium  Result Value Ref Range Status   SARS Coronavirus 2 by RT PCR NEGATIVE NEGATIVE Final    Comment: (NOTE) SARS-CoV-2 target nucleic acids are NOT DETECTED.  The SARS-CoV-2 RNA is generally detectable in upper respiratory specimens during the acute phase of infection. The lowest concentration of SARS-CoV-2 viral copies this assay can detect is 138 copies/mL. A negative result does not preclude SARS-Cov-2 infection and should not be used as the sole basis for treatment or other patient management decisions. A negative result may occur with  improper specimen collection/handling, submission of specimen other than nasopharyngeal swab, presence of viral mutation(s) within the areas targeted by this assay, and inadequate number of viral copies(<138 copies/mL). A negative result must be combined with clinical observations, patient history, and epidemiological information. The expected result is Negative.  Fact Sheet for Patients:  BloggerCourse.com  Fact Sheet for Healthcare Providers:  SeriousBroker.it  This test is no t yet approved or cleared by the [place] FDA and  has been authorized for detection and/or diagnosis of SARS-CoV-2  by FDA under an Emergency Use Authorization (EUA). This EUA will remain  in effect (meaning this test can be used) for the duration of the COVID-19 declaration under Section 564(b)(1) of the Act, 21 U.S.C.section 360bbb-3(b)(1), unless the authorization is terminated  or revoked sooner.       Influenza A by PCR NEGATIVE NEGATIVE Final   Influenza B by PCR NEGATIVE NEGATIVE Final    Comment: (NOTE) The Xpert Xpress SARS-CoV-2/FLU/RSV plus assay is intended as an aid in the diagnosis of influenza from Nasopharyngeal swab specimens and should not be used as a sole basis for treatment. Nasal washings and aspirates are unacceptable for Xpert Xpress SARS-CoV-2/FLU/RSV testing.  Fact Sheet for Patients: BloggerCourse.com  Fact Sheet for Healthcare Providers: SeriousBroker.it  This test is not yet approved or cleared by the [place] FDA and has been authorized for detection and/or diagnosis of SARS-CoV-2 by FDA under an Emergency Use Authorization (EUA). This EUA will remain in effect (meaning this test can be used) for the duration of the COVID-19 declaration under Section 564(b)(1) of the Act, 21 U.S.C. section 360bbb-3(b)(1), unless the authorization is terminated or revoked.  Performed at [facility], [address], [place], [place] [address]   MRSA PCR Screening     Status: None   Collection Time: 01/10/20 10:30 PM   Specimen: Nasal Mucosa; Nasopharyngeal  Result Value Ref Range Status   MRSA by PCR NEGATIVE NEGATIVE Final    Comment:        The GeneXpert MRSA Assay (FDA approved for NASAL specimens only), is one component of a comprehensive MRSA colonization surveillance program. It is not intended to diagnose MRSA infection nor to guide or monitor treatment for  MRSA infections. Performed at [facility], [address] [place], [place] [id number]          Radiology  Studies: No results found.      Scheduled Meds: . Chlorhexidine Gluconate Cloth  6 each Topical Daily  . dextrose  25 g Intravenous Once  . enoxaparin (LOVENOX) injection  30 mg Subcutaneous Q24H  . influenza vaccine adjuvanted  0.5 mL Intramuscular Tomorrow-1000  . insulin aspart  0-9 Units Subcutaneous Q6H  . lip balm  1 application Topical BID  . mouth rinse  15 mL Mouth Rinse BID  . meningococcal oligosaccharide  0.5 mL Intramuscular Once  . pantoprazole  40 mg Intravenous Q12H  . sodium chloride flush  10-40 mL Intracatheter Q12H  . thiamine  100 mg Oral Daily   Or  . thiamine  100 mg Intravenous Daily   Continuous Infusions: . sodium chloride Stopped (01/13/20 0337)  . dextrose 5% lactated ringers 35 mL/hr at 01/16/20 1416  . methocarbamol (ROBAXIN) IV 500 mg (01/16/20 1644)  . ondansetron (ZOFRAN) IV    . potassium PHOSPHATE IVPB (in mmol) 42 mL/hr at 01/16/20 1416  . TPN ADULT (ION) 40 mL/hr at 01/16/20 1416  . TPN ADULT (ION)       LOS: 6 days        Kathlen ModyVijaya Tarrie Mcmichen, MD [facility]   To contact the attending provider between 7A-7P or the covering provider during after hours 7P-7A, please log into the web site [email or web address] and access using universal [place] password for that web site. If you do not have the password, please call the hospital operator.  01/16/2020, 4:46 PM

## 2020-01-16 NOTE — Evaluation (Signed)
Physical Therapy Evaluation Patient Details Name: Tracy Barton MRN: [id number] DOB: 02/06/1945 Today's Date: 01/16/2020   History of Present Illness  Patient is a 75 year old female PMH of hepatitic C infection, alcohol abuse. Patient presented secondary to abdominal pain and found to have a perforated gastric ulcer s/p omental Cheree Ditto patch 01/10/2020  Clinical Impression  The patient requires extensive assistance for mobility due to agitation/resitive. Patient yells out when rolling and sitting on sife of bed. Patient resistive to standing. Unsure of patient's support system at this time, patient unable to provide info. Pt admitted with above diagnosis.  Pt currently with functional limitations due to the deficits listed below (see PT Problem List). Pt will benefit from skilled PT to increase their independence and safety with mobility to allow discharge to the venue listed below.       Follow Up Recommendations SNF    Equipment Recommendations   (TBD)    Recommendations for Other Services       Precautions / Restrictions Precautions Precautions: Fall Precaution Comments: mits, combative Restrictions Weight Bearing Restrictions: No      Mobility  Bed Mobility Overal bed mobility: Needs Assistance Bed Mobility: Rolling;Sit to Supine;Sidelying to Sit Rolling: Max assist Sidelying to sit: Min guard;HOB elevated   Sit to supine: Max assist;+2 for safety/equipment   General bed mobility comments: with increased time patient able to push from from sidelying and sit up at EOB with mod A to scoot toward EOB. patient resistant to lying down and rolling for peri care therefore requiring max A x2 for safety    Transfers                 General transfer comment: deferred 2* cognition/agitation  Ambulation/Gait                Stairs            Wheelchair Mobility    Modified Rankin (Stroke Patients Only)       Balance Overall balance assessment:  Needs assistance Sitting-balance support: Feet unsupported;No upper extremity supported Sitting balance-Leahy Scale: Fair         Standing balance comment: deferred, patient resisted attempts to assist to stand up.                             Pertinent Vitals/Pain Pain Assessment: Faces Faces Pain Scale: Hurts even more Pain Location: abdomen Pain Descriptors / Indicators: Crying;Moaning;Operative site guarding Pain Intervention(s): Monitored during session;Limited activity within patient's tolerance    Home Living Family/patient expects to be discharged to:: Unsure Living Arrangements: Children               Additional Comments: patient unable to provide history, asking where her mom + dad and siblings are    Prior Function           Comments: unsure, patient is unable to provide history disoriented, combative and emotionally liable     Hand Dominance   Dominant Hand:  (unable to specify)    Extremity/Trunk Assessment   Upper Extremity Assessment Upper Extremity Assessment: Defer to OT evaluation    Lower Extremity Assessment Lower Extremity Assessment: Difficult to assess due to impaired cognition    Cervical / Trunk Assessment Cervical / Trunk Assessment: Other exceptions Cervical / Trunk Exceptions: sits up, leans in all directions  Communication   Communication: No difficulties  Cognition Arousal/Alertness: Awake/alert Behavior During Therapy: Restless;Agitated Overall Cognitive Status: No  family/caregiver present to determine baseline cognitive functioning                                 General Comments: patient unable to state where she is, asking for her mom and dad, emotionally liable      General Comments      Exercises     Assessment/Plan    PT Assessment Patient needs continued PT services  PT Problem List         PT Treatment Interventions DME instruction;Therapeutic activities;Cognitive  remediation;Gait training;Therapeutic exercise;Patient/family education;Functional mobility training;Balance training    PT Goals (Current goals can be found in the Care Plan section)  Acute Rehab PT Goals Patient Stated Goal: pt unable PT Goal Formulation: Patient unable to participate in goal setting Time For Goal Achievement: 01/30/20 Potential to Achieve Goals: Fair    Frequency Min 2X/week   Barriers to discharge        Co-evaluation PT/OT/SLP Co-Evaluation/Treatment: Yes Reason for Co-Treatment: Complexity of the patient's impairments (multi-system involvement);For patient/therapist safety;Necessary to address cognition/behavior during functional activity;To address functional/ADL transfers PT goals addressed during session: Mobility/safety with mobility OT goals addressed during session: ADL's and self-care       AM-PAC PT "6 Clicks" Mobility  Outcome Measure Help needed turning from your back to your side while in a flat bed without using bedrails?: Total Help needed moving from lying on your back to sitting on the side of a flat bed without using bedrails?: Total Help needed moving to and from a bed to a chair (including a wheelchair)?: Total Help needed standing up from a chair using your arms (e.g., wheelchair or bedside chair)?: Total Help needed to walk in hospital room?: Total Help needed climbing 3-5 steps with a railing? : Total 6 Click Score: 6    End of Session   Activity Tolerance: Treatment limited secondary to agitation Patient left: in bed;with call bell/phone within reach;with bed alarm set Nurse Communication: Mobility status PT Visit Diagnosis: Unsteadiness on feet (R26.81);Difficulty in walking, not elsewhere classified (R26.2);Pain    Time: 0800-0827 PT Time Calculation (min) (ACUTE ONLY): 27 min   Charges:   PT Evaluation $PT Eval Low Complexity: 1 Low          Blanchard Kelch PT Acute Rehabilitation Services Pager [phone] Office  [phone]   Rada Hay 01/16/2020, 10:50 AM

## 2020-01-16 NOTE — Progress Notes (Signed)
PHARMACY - TOTAL PARENTERAL NUTRITION CONSULT NOTE   Indication: s/p graham patch, malnourished, lethargic  Patient Measurements: Height: 5\' 4"  (162.6 cm) Weight: 45.8 kg (100 lb 15.5 oz) IBW/kg (Calculated) : 54.7 TPN AdjBW (KG): 38.7 Body mass index is 17.33 kg/m. Usual Weight:   Assessment: 69 yoF admitted on 1/8 with perforated gastric ulcer s/p laparoscopic graham patch, laparoscopic splenectomy 01/10/20.  UGI not suggestive of leak and NGT output has remained low.  Pharmacy is consulted to start TPN for malnutrition.  Note risk for re-feeding sydrome d/t poor PO intake. Per CCS, allow clears if patient cleared by SLP.  Discussed using enteral nutrition preferentially with 03/09/20, PA instead of TPN, however, NGT has already been removed.  She is very lethargic (benzodiazepines for alcohol withdrawal) and is not expected to be able to meet nutritional needs with PO intake.  Glucose / Insulin:  No known history of diabetes.  CBGs 108-145 on TPN at 40 ml/hr, 1 U SSI/24 hrs Electrolytes: K 3.7 after 6 runs +kphos 10, Phos 2.2 after kphos 10, Mag low 1.5; others WNL including CorrCa 9.6 Renal: SCr and BUN low LFTs / TGs: LFTs wnl; TG 125 Prealbumin / albumin: Albumin 2.5; Prealbumin 7.3 Intake / Output; MIVF: 1975 ml UOP, total I/O - 27mL/day.  D5-LR @ 35 ml/hr. GI Imaging:  Surgeries / Procedures: 1/8 laparoscopic graham patch, splenectomy  Central access: PICC (placed 1/11) TPN start date: 1/13  Nutritional Goals (per RD recommendation on 1/13): Kcal:  1450-1650 kcal Protein:  75-90 grams Fluid:  >/= 1.7 L/day Goal TPN rate is 70 mL/hr (provides 84 g of protein and 1616 kcals per day)  Current Nutrition:  Clear liquids and TPN  Plan:  Now: Mag 4 gm,  Potassium Phosphate 10 mMol, 1 run KCL  At 18:00 continue TPN at 40 mL/hr until refeeding resolved Standard Electrolytes in TPN: 44mEq/L of Na, 24mEq/L of K, 56mEq/L of Ca, 50mEq/L of Mg, and 38mmol/L of Phos. Cl:Ac ratio  1:1 Add standard MVI and trace elements to TPN continue Sensitive SSI and CBGs q6h continue MIVF at 35 mL/hr Monitor TPN labs on Mon/Thurs.  BMET, Mag and Phos in AM. Recommend to use enteral nutrition if/when safe to do so.    12m, Pharm.D 01/16/2020 9:32 AM [facility] main pharmacy 01/18/2020 01/16/2020 9:32 AM

## 2020-01-16 NOTE — Progress Notes (Signed)
CRITICAL VALUE ALERT  Critical Value:  Hgb 6.8  Date & Time Notied:  01/16/2020 6294  Provider Notified: Dr. Magnus Ivan  Orders Received/Actions taken: Awaiting new orders

## 2020-01-16 NOTE — Evaluation (Signed)
Clinical/Bedside Swallow Evaluation Patient Details  Name: Tracy Barton MRN: [id number] Date of Birth: 1945/06/06  Today's Date: 01/16/2020 Time: SLP Start Time (ACUTE ONLY): 1525 SLP Stop Time (ACUTE ONLY): 1535 SLP Time Calculation (min) (ACUTE ONLY): 10 min  Past Medical History:  Past Medical History:  Diagnosis Date  . Blood transfusion without reported diagnosis   . Depression   . Neuromuscular disorder [facility])    Past Surgical History:  Past Surgical History:  Procedure Laterality Date  . BRAIN SURGERY    . LAPAROSCOPY N/A 01/10/2020   Procedure: LAPAROSCOPY DIAGNOSTIC laparoscopic washout omental patch splenectomy;  Surgeon: Karie Soda, MD;  Location: [facility] ORS;  Service: General;  Laterality: N/A;  . LAPAROTOMY N/A 01/10/2020   Procedure: EXPLORATORY LAPAROTOMY;  Surgeon: Karie Soda, MD;  Location: [facility];  Service: General;  Laterality: N/A;  . TUBAL LIGATION     HPI:  75 yo female adm to [facility] with Perforated prepyloric gastric ulcer s/p omental Cheree Ditto patch 01/10/2020    PMH + Smoker    Gastritis and gastroduodenitis    Trigeminal neuralgia    Hep C w/o coma, chronic (HCC)    History of medication noncompliance    ARF (acute renal failure) (HCC)    S/P laparoscopic splenectomy    Protein-calorie malnutrition, severe.  Swallow eval ordered. Pt has been agitated and has not accepted po intake.   Assessment / Plan / Recommendation Clinical Impression  Pt presents with functional oropharyngeal swallow ability with intake of thin/clears. She accepted only warm sweet tea from this SLP.  Swallowed multiple sequential sips via straw with timely swallow and no coughing/throata clearing.  Swallow appeared very timely as pt consumed approx 1/4 cup of tea.  She declined further intake.   Recommend continue to offer clears per GI.  If deem ready to advance, would consider to puree/thin. Will follow up briefly to assure tolerance of po and advancement appropriateness.  Provided pt with warm  blanket and adjusted temperature in room to aid her comfort level. SLP Visit Diagnosis: Dysphagia, unspecified (R13.10)    Aspiration Risk  Mild aspiration risk (due to decreased attention)    Diet Recommendation Thin liquid   Liquid Administration via: Straw Medication Administration: Via alternative means Supervision: Patient able to self feed Compensations: Slow rate;Small sips/bites Postural Changes: Seated upright at 90 degrees;Remain upright for at least 30 minutes after po intake    Other  Recommendations Oral Care Recommendations: Oral care QID   Follow up Recommendations        Frequency and Duration min 1 x/week  1 week       Prognosis Prognosis for Safe Diet Advancement: Fair Barriers to Reach Goals: Cognitive deficits      Swallow Study   General Date of Onset: 01/16/20 HPI: 75 yo female adm to [facility] with Perforated prepyloric gastric ulcer s/p omental Cheree Ditto patch 01/10/2020    PMH + Smoker    Gastritis and gastroduodenitis    Trigeminal neuralgia    Hep C w/o coma, chronic (HCC)    History of medication noncompliance    ARF (acute renal failure) (HCC)    S/P laparoscopic splenectomy    Protein-calorie malnutrition, severe.  Swallow eval ordered. Pt has been agitated and has not accepted po intake. Type of Study: Bedside Swallow Evaluation Previous Swallow Assessment: none Diet Prior to this Study: Thin liquids;Other (Comment) (clears) Temperature Spikes Noted: No Respiratory Status: Room air History of Recent Intubation: No Behavior/Cognition: Alert;Confused;Impulsive;Doesn't follow directions;Distractible Oral Care Completed by  SLP: No Oral Cavity - Dentition: Edentulous Self-Feeding Abilities: Needs assist (able to help hold cup) Patient Positioning: Upright in bed Baseline Vocal Quality: Normal Volitional Cough: Cognitively unable to elicit Volitional Swallow: Unable to elicit    Oral/Motor/Sensory Function Overall Oral Motor/Sensory Function: Other  (comment) (no focal CN deficits apparent, pt did not follow directions)   Ice Chips Ice chips: Not tested   Thin Liquid Thin Liquid: Within functional limits Presentation: Straw    Nectar Thick Nectar Thick Liquid: Not tested   Honey Thick Honey Thick Liquid: Not tested   Puree Puree: Not tested Other Comments: pt refused   Solid    [name], MS [facility] SLP Acute Rehab Services Office [phone] Pager [phone]   Other Comments: pt on clears - and she would not accept solid anytime regardless      [name] 01/16/2020,6:02 PM

## 2020-01-16 NOTE — Progress Notes (Addendum)
6 Days Post-Op   Subjective/Chief Complaint: More alert but still with confusion   Objective: Vital signs in last 24 hours: Temp:  [97.5 F (36.4 C)-98.6 F (37 C)] 97.8 F (36.6 C) (01/14 0336) Pulse Rate:  [54-111] 65 (01/14 0200) Resp:  [10-27] 14 (01/14 0200) BP: (133-165)/(63-125) 136/90 (01/14 0200) SpO2:  [94 %-100 %] 100 % (01/14 0200) Weight:  [45.8 kg] 45.8 kg (01/13 1400) Last BM Date: 01/15/20  Intake/Output from previous day: 01/13 0701 - 01/14 0700 In: 1893.7 [I.V.:1024.3; IV Piggyback:869.5] Out: 1975 [Urine:1975] Intake/Output this shift: No intake/output data recorded.   Exam: Getting up with therapy, following commands but still confused Abdomen soft, non-distended, minimally tender  Lab Results:  Recent Labs    01/14/20 1435 01/15/20 0853  WBC 9.0 8.9  HGB 7.2* 8.6*  HCT 21.9* 26.5*  PLT 330 425*   BMET Recent Labs    01/15/20 0826 01/15/20 0853  NA 141 142  K 3.0* 3.1*  CL 100 100  CO2 30 30  GLUCOSE 346* 74  BUN 6* 6*  CREATININE 0.67 0.71  CALCIUM 8.0* 8.6*   PT/INR No results for input(s): LABPROT, INR in the last 72 hours. ABG No results for input(s): PHART, HCO3 in the last 72 hours.  Invalid input(s): PCO2, PO2  Studies/Results: DG UGI W SINGLE CM (SOL OR THIN BA)  Result Date: 01/14/2020 CLINICAL DATA:  History of perforated gastric ulcer. EXAM: UPPER GI SERIES WITH KUB TECHNIQUE: After obtaining a scout radiograph a routine upper GI series was performed using water-soluble contrast through the existing NG tube. FLUOROSCOPY TIME:  Fluoroscopy Time:  2 minutes 30 seconds Radiation Exposure Index (if provided by the fluoroscopic device): 32.2 mGy Number of Acquired Spot Images: 4 COMPARISON:  January 11, 2020 FINDINGS: 120 cc of Omnipaque water-soluble contrast was introduced into the stomach in the LPO position. The patient was turned supine and then into the RPO position and subsequently into the RIGHT lateral position. There  is no gross extravasation of water-soluble contrast. The patient during the examination was sedated due to medications administered on the floor and mildly combative such that full evaluation PO contrast administration was not possible. Reflux was seen along the gastric tube to the level of the mid esophagus during the evaluation. IMPRESSION: Limited upper GI evaluation due to patient's mental status. No gross contrast extravasation on water-soluble study with patient positioning as described. If there is continued concern for ongoing leak given mental status could consider administration of water-soluble contrast and abdominal CT to exclude small leak not visible on fluoroscopy limited by patient condition. These results were called by telephone at the time of interpretation on 01/14/2020 at 2:37 pm to provider [facility] , who verbally acknowledged these results. Electronically Signed   By: Donzetta Kohut M.D.   On: 01/14/2020 14:37    Anti-infectives: Anti-infectives (From admission, onward)   Start     Dose/Rate Route Frequency Ordered Stop   01/12/20 0600  piperacillin-tazobactam (ZOSYN) IVPB 3.375 g        3.375 g 12.5 mL/hr over 240 Minutes Intravenous Every 8 hours 01/12/20 0358     01/11/20 0000  piperacillin-tazobactam (ZOSYN) IVPB 2.25 g  Status:  Discontinued        2.25 g 100 mL/hr over 30 Minutes Intravenous Every 6 hours 01/10/20 2139 01/12/20 0358   01/10/20 2215  piperacillin-tazobactam (ZOSYN) IVPB 3.375 g  Status:  Discontinued        3.375 g 12.5 mL/hr over 240  Minutes Intravenous Every 8 hours 01/10/20 2122 01/10/20 2139   01/10/20 1200  cefoTEtan (CEFOTAN) 2 g in sodium chloride 0.9 % 100 mL IVPB        2 g 200 mL/hr over 30 Minutes Intravenous To ShortStay Surgical 01/10/20 1133 01/11/20 0700   01/10/20 1115  piperacillin-tazobactam (ZOSYN) IVPB 3.375 g        3.375 g 100 mL/hr over 30 Minutes Intravenous  Once 01/10/20 1106 01/10/20 1200       Assessment/Plan: Tobacco abuse Alcohol abuse Hx of trigeminal neuralgia  Chronic Hepatitis C Severe protein calorie malnutrition   Perforated gastric ulcer S/p laparoscopic graham patch, laparoscopic splenectomy 01/10/20 Dr. [name]  POD#6  Still trying to get up on her own and not fully following commands requiring restraints for safety UGI without obvious leak.  NG out Stop IV antibiotics Encourage po intake, SLP following Try more clear liquids PT Dispo.  Continue current care.  Confusion remains limiting factor for discharge. Could move to the floor if a sitter can be present  hgb 6.8 so will transfuse 1 unit PRBC   LOS: 6 days    Tracy Barton 01/16/2020

## 2020-01-16 NOTE — Evaluation (Signed)
Occupational Therapy Evaluation Patient Details Name: Tracy Barton MRN: [id number] DOB: [birth year] Today's Date: 01/16/2020    History of Present Illness Patient is a 75 year old female PMH of hepatitic C infection, alcohol abuse. Patient presented secondary to abdominal pain and found to have a perforated gastric ulcer s/p omental Cheree Ditto patch 01/10/2020   Clinical Impression   Patient is oriented to self only, unable to provide history and calling out for her mom/dad, siblings. With encouragement patient did sit up at EOB with min G for safety however is emotionally liable and becomes resistant when therapy attempts to assist with mobility such as standing from EOB. Patient require max A x2 to return to semi-supine, for rolling and perianal care d/t incontinent of bowel. Recommend continued acute OT to further assess progression of cognition, safety, mobility in order to facilitate D/C to venue listed below.    Follow Up Recommendations  SNF    Equipment Recommendations  None recommended by OT       Precautions / Restrictions Precautions Precautions: Fall Precaution Comments: mits, combative Restrictions Weight Bearing Restrictions: No      Mobility Bed Mobility Overal bed mobility: Needs Assistance Bed Mobility: Rolling;Sit to Supine;Sidelying to Sit Rolling: Max assist Sidelying to sit: Min guard;HOB elevated   Sit to supine: Max assist;+2 for safety/equipment   General bed mobility comments: with increased time patient able to push from from sidelying and sit up at EOB with mod A to scoot toward EOB. patient resistant to lying down and rolling for peri care therefore requiring max A x2 for safety    Transfers                 General transfer comment: deferred 2* cognition/agitation    Balance Overall balance assessment: Needs assistance Sitting-balance support: Feet unsupported;No upper extremity supported Sitting balance-Leahy Scale: Fair          Standing balance comment: deferred                           ADL either performed or assessed with clinical judgement   ADL Overall ADL's : Needs assistance/impaired Eating/Feeding: Maximal assistance;Sitting Eating/Feeding Details (indicate cue type and reason): to drink juice Grooming: Maximal assistance;Wash/dry face Grooming Details (indicate cue type and reason): patient's nose running, requires cues and assist to wipe her nose Upper Body Bathing: Maximal assistance;Sitting   Lower Body Bathing: Total assistance;Sitting/lateral leans;Bed level   Upper Body Dressing : Maximal assistance;Sitting   Lower Body Dressing: Total assistance;Sitting/lateral leans;Bed level     Toilet Transfer Details (indicate cue type and reason): deferred, patient combative when attempted to stand from EOB Toileting- Clothing Manipulation and Hygiene: Total assistance;Bed level Toileting - Clothing Manipulation Details (indicate cue type and reason): patient resistant to perianal care/rolling in bed after incontinent of bowel movement       General ADL Comments: difficult to assess level of assist as patient is resistant to directions, initiating mobility of own volition                  Pertinent Vitals/Pain Pain Assessment: Faces Faces Pain Scale: Hurts even more Pain Location: abdomen Pain Descriptors / Indicators: Crying;Moaning;Operative site guarding Pain Intervention(s): Monitored during session     Hand Dominance  (unable to specify)   Extremity/Trunk Assessment Upper Extremity Assessment Upper Extremity Assessment: Difficult to assess due to impaired cognition   Lower Extremity Assessment Lower Extremity Assessment: Defer to PT evaluation  Communication Communication Communication: No difficulties   Cognition Arousal/Alertness: Awake/alert Behavior During Therapy: Restless;Agitated Overall Cognitive Status: No family/caregiver present to determine  baseline cognitive functioning                                 General Comments: patient unable to state where she is, asking for her mom and dad, emotionally liable              Home Living Family/patient expects to be discharged to:: Unsure                                 Additional Comments: patient unable to provide history, asking where her mom + dad and siblings are      Prior Functioning/Environment          Comments: unsure, patient is unable to provide history disoriented, combative and emotionally liable        OT Problem List: Decreased activity tolerance;Impaired balance (sitting and/or standing);Decreased cognition;Decreased safety awareness;Decreased knowledge of precautions;Pain      OT Treatment/Interventions: Self-care/ADL training;Therapeutic activities;Cognitive remediation/compensation;Patient/family education;Balance training    OT Goals(Current goals can be found in the care plan section) Acute Rehab OT Goals Patient Stated Goal: pt unable OT Goal Formulation: Patient unable to participate in goal setting Time For Goal Achievement: 01/30/20 Potential to Achieve Goals: Fair  OT Frequency: Min 2X/week           Co-evaluation PT/OT/SLP Co-Evaluation/Treatment: Yes Reason for Co-Treatment: Complexity of the patient's impairments (multi-system involvement);Necessary to address cognition/behavior during functional activity;For patient/therapist safety;To address functional/ADL transfers   OT goals addressed during session: ADL's and self-care      AM-PAC OT "6 Clicks" Daily Activity     Outcome Measure Help from another person eating meals?: A Lot Help from another person taking care of personal grooming?: A Lot Help from another person toileting, which includes using toliet, bedpan, or urinal?: Total Help from another person bathing (including washing, rinsing, drying)?: Total Help from another person to put on and  taking off regular upper body clothing?: A Lot Help from another person to put on and taking off regular lower body clothing?: Total 6 Click Score: 9   End of Session Nurse Communication: Mobility status  Activity Tolerance: Treatment limited secondary to agitation Patient left: in bed;with call bell/phone within reach;with bed alarm set;Other (comment) (mits on)  OT Visit Diagnosis: Other abnormalities of gait and mobility (R26.89);Other symptoms and signs involving cognitive function;Pain Pain - part of body:  (abdomen)                Time: 0737-1062 OT Time Calculation (min): 26 min Charges:  OT General Charges $OT Visit: 1 Visit OT Evaluation $OT Eval Moderate Complexity: 1 Mod  Tracy Barton OT OT pager: [phone]  Tracy Barton 01/16/2020, 9:31 AM

## 2020-01-17 DIAGNOSIS — K251 Acute gastric ulcer with perforation: Secondary | ICD-10-CM | POA: Diagnosis not present

## 2020-01-17 DIAGNOSIS — N179 Acute kidney failure, unspecified: Secondary | ICD-10-CM | POA: Diagnosis not present

## 2020-01-17 LAB — CBC
HCT: 28.5 % — ABNORMAL LOW (ref 36.0–46.0)
Hemoglobin: 9.6 g/dL — ABNORMAL LOW (ref 12.0–15.0)
MCH: 29.8 pg (ref 26.0–34.0)
MCHC: 33.7 g/dL (ref 30.0–36.0)
MCV: 88.5 fL (ref 80.0–100.0)
Platelets: 531 10*3/uL — ABNORMAL HIGH (ref 150–400)
RBC: 3.22 MIL/uL — ABNORMAL LOW (ref 3.87–5.11)
RDW: 13.8 % (ref 11.5–15.5)
WBC: 12.6 10*3/uL — ABNORMAL HIGH (ref 4.0–10.5)
nRBC: 0.5 % — ABNORMAL HIGH (ref 0.0–0.2)

## 2020-01-17 LAB — BASIC METABOLIC PANEL
Anion gap: 7 (ref 5–15)
BUN: 8 mg/dL (ref 8–23)
CO2: 28 mmol/L (ref 22–32)
Calcium: 8.7 mg/dL — ABNORMAL LOW (ref 8.9–10.3)
Chloride: 104 mmol/L (ref 98–111)
Creatinine, Ser: 0.68 mg/dL (ref 0.44–1.00)
GFR, Estimated: >60 mL/min (ref >60)
Glucose, Bld: 134 mg/dL — ABNORMAL HIGH (ref 70–99)
Potassium: 3.3 mmol/L — ABNORMAL LOW (ref 3.5–5.1)
Sodium: 139 mmol/L (ref 135–145)

## 2020-01-17 LAB — TYPE AND SCREEN
ABO/RH(D): B POS
Antibody Screen: NEGATIVE
Unit division: 0

## 2020-01-17 LAB — BPAM RBC
Blood Product Expiration Date: 202201292359
ISSUE DATE / TIME: 202201141051
Unit Type and Rh: 7300

## 2020-01-17 LAB — GLUCOSE, CAPILLARY
Glucose-Capillary: 142 mg/dL — ABNORMAL HIGH (ref 70–99)
Glucose-Capillary: 125 mg/dL — ABNORMAL HIGH (ref 70–99)
Glucose-Capillary: 100 mg/dL — ABNORMAL HIGH (ref 70–99)
Glucose-Capillary: 135 mg/dL — ABNORMAL HIGH (ref 70–99)

## 2020-01-17 LAB — MAGNESIUM: Magnesium: 1.8 mg/dL (ref 1.7–2.4)

## 2020-01-17 LAB — PHOSPHORUS: Phosphorus: 2.9 mg/dL (ref 2.5–4.6)

## 2020-01-17 MED ORDER — POTASSIUM CHLORIDE 20 MEQ PO PACK
40.0000 meq | PACK | Freq: Once | ORAL | Status: AC
  Administered 2020-01-17: 40 meq via ORAL

## 2020-01-17 MED ORDER — AMLODIPINE BESYLATE 5 MG PO TABS
5.0000 mg | ORAL_TABLET | Freq: Every day | ORAL | Status: DC
  Administered 2020-01-17 – 2020-01-23 (×7): 5 mg via ORAL
  Filled 2020-01-17 (×7): qty 1

## 2020-01-17 MED ORDER — DEXTROSE 10 % IV SOLN: INTRAVENOUS | Status: DC

## 2020-01-17 MED ORDER — MAGNESIUM SULFATE 2 GM/50ML IV SOLN
2.0000 g | Freq: Once | INTRAVENOUS | Status: AC
  Administered 2020-01-17: 2 g via INTRAVENOUS
  Filled 2020-01-17: qty 50

## 2020-01-17 MED ORDER — PANTOPRAZOLE SODIUM 40 MG PO TBEC
40.0000 mg | DELAYED_RELEASE_TABLET | Freq: Two times a day (BID) | ORAL | Status: DC
Start: 2020-01-17 — End: 2020-01-23
  Administered 2020-01-17 – 2020-01-23 (×13): 40 mg via ORAL
  Filled 2020-01-17 (×12): qty 1

## 2020-01-17 NOTE — Progress Notes (Signed)
7 Days Post-Op   Subjective/Chief Complaint: Comfortable Denies abdominal pain   Objective: Vital signs in last 24 hours: Temp:  [97.4 F (36.3 C)-98.8 F (37.1 C)] 97.9 F (36.6 C) (01/15 0349) Pulse Rate:  [57-98] 57 (01/15 0500) Resp:  [10-23] 10 (01/15 0500) BP: (131-206)/(53-140) 159/76 (01/15 0400) SpO2:  [95 %-100 %] 100 % (01/15 0500) Last BM Date: 01/16/20  Intake/Output from previous day: 01/14 0701 - 01/15 0700 In: 3028.7 [I.V.:1856.4; Blood:630; IV Piggyback:542.3] Out: 1925 [Urine:1925] Intake/Output this shift: No intake/output data recorded.  Exam: Much more awake and alert this morning Lab Results:  Recent Labs    01/16/20 0733 01/16/20 1649 01/17/20 0540  WBC 8.9  --  12.6*  HGB 6.8* 8.7* 9.6*  HCT 20.6* 26.6* 28.5*  PLT 443*  --  531*   BMET Recent Labs    01/16/20 0733 01/17/20 0540  NA 138 139  K 3.7 3.3*  CL 103 104  CO2 26 28  GLUCOSE 108* 134*  BUN 9 8  CREATININE 0.59 0.68  CALCIUM 8.4* 8.7*   PT/INR No results for input(s): LABPROT, INR in the last 72 hours. ABG No results for input(s): PHART, HCO3 in the last 72 hours.  Invalid input(s): PCO2, PO2  Studies/Results: No results found.  Anti-infectives: Anti-infectives (From admission, onward)   Start     Dose/Rate Route Frequency Ordered Stop   01/12/20 0600  piperacillin-tazobactam (ZOSYN) IVPB 3.375 g  Status:  Discontinued        3.375 g 12.5 mL/hr over 240 Minutes Intravenous Every 8 hours 01/12/20 0358 01/16/20 0809   01/11/20 0000  piperacillin-tazobactam (ZOSYN) IVPB 2.25 g  Status:  Discontinued        2.25 g 100 mL/hr over 30 Minutes Intravenous Every 6 hours 01/10/20 2139 01/12/20 0358   01/10/20 2215  piperacillin-tazobactam (ZOSYN) IVPB 3.375 g  Status:  Discontinued        3.375 g 12.5 mL/hr over 240 Minutes Intravenous Every 8 hours 01/10/20 2122 01/10/20 2139   01/10/20 1200  cefoTEtan (CEFOTAN) 2 g in sodium chloride 0.9 % 100 mL IVPB        2 g 200  mL/hr over 30 Minutes Intravenous To ShortStay Surgical 01/10/20 1133 01/11/20 0700   01/10/20 1115  piperacillin-tazobactam (ZOSYN) IVPB 3.375 g        3.375 g 100 mL/hr over 30 Minutes Intravenous  Once 01/10/20 1106 01/10/20 1200      Assessment/Plan: Tobacco abuse Alcohol abuse Hx of trigeminal neuralgia  Chronic Hepatitis C Severe protein calorie malnutrition   Perforated gastric ulcer S/p laparoscopic graham patch, laparoscopic splenectomy 01/10/20 Dr. [name]   Hgb better this morning Clear liquid diet PT/OT Transfer to med/surg bed Stop TNA  LOS: 7 days    Abigail Miyamoto MD 01/17/2020

## 2020-01-17 NOTE — Progress Notes (Signed)
Pt remains oriented to person only. Pt pulling at PICC IV lines and foley on numerous occasions. Attempts to reorient pt unsuccessful. Call to family member on her cell phone but they were also unable to assist w reorientation. At one point, pt agitated and grabbing at her TPN line and refused to allow me to flush her unused port. Soft wrist restraints remain in place. Prn pain med and med given for agitation w little relief. Bed moved closer to the nurse's station for close monitoring.

## 2020-01-17 NOTE — Progress Notes (Signed)
PROGRESS NOTE    Tracy Barton  GXQ:[id number] DOB: [birth year] DOA: 01/10/2020 PCP: Patient, No Pcp Per    Chief Complaint  Patient presents with  . Abdominal Pain    Brief Narrative:  Tracy Barton is a 75 y.o. female with a history of hepatitic C infection, alcohol abuse. Patient presented secondary to abdominal pain and found to have a perforated gastric ulcer requiring emergent surgery. Hospital course complicated by alcohol withdrawal symptoms. Pt is currently on prn haldol.   Assessment & Plan:   Principal Problem:   Perforated prepyloric gastric ulcer s/p omental Tracy Barton patch 01/10/2020 Active Problems:   Smoker   Gastritis and gastroduodenitis   Trigeminal neuralgia   Hep C w/o coma, chronic (HCC)   History of medication noncompliance   ARF (acute renal failure) (HCC)   S/P laparoscopic splenectomy   Protein-calorie malnutrition, severe   Perforated gastric ulcer; S/p splenectomy,  Washout/lysis of adhesions and omental patch.  Further management as per surgery. Pt currently on clear liquid diet and able to tolerate.    Acute encephalopathy secondary to alcohol withdrawal in the setting of ? Dementia. At home pt is on atarax for anxiety?  Currently she is on IV haldol  Ativan was discontinued as she became sedated from IV ativan.  Pt is more alert and answering questions appropriately.   AKI:  - resolved    Hypokalemia:  Replaced.    Hypophosphatemia:  Replaced.    Acute Anemia of blood loss  From surgery superimposed on anemia of chronic disease:  Hemoglobin of 6.8 on 01/16/20 1 unit of prbc transfusion ordered by general surgery. Repeat hemoglobin improved.  Transfuse to keep hemoglobin greater than 8.   Nutrition:  Advance diet as tolerated.    Metabolic acidosis:  -elevated anion gap.  Resolved.   Acute metabolic encephalopathy:  - probably from alcohol withdrawal and ativan and dilaudid.  - ativan d/ced today. Try to minimize the  use of dilaudid.  - she is alert and oriented to person and place.     Hypertensive urgency.  On IV hydralazine prn, and metoprolol prn.  Pt not on meds at home prior to admission.  Started on norvasc 5 mg daily.   Mild leukocytosis.       DVT prophylaxis: Lovenox Code Status: (Full code) Family Communication: none at bedside Disposition:  Per primary service.    Antimicrobials: NOne.    Subjective:  Alert and answering questions appropriately.   Objective: Vitals:   01/17/20 0700 01/17/20 0800 01/17/20 0900 01/17/20 1041  BP:  (!) 151/104  (!) 177/79  Pulse: (!) 55 (!) 55 69 65  Resp: (!) 9 16 14 18   Temp:    98.1 F (36.7 C)  TempSrc:    Oral  SpO2: 100% 100% 100% 100%  Weight:      Height:        Intake/Output Summary (Last 24 hours) at 01/17/2020 1214 Last data filed at 01/17/2020 1000 Gross per 24 hour  Intake 2713.69 ml  Output 1925 ml  Net 788.69 ml   Filed Weights   01/10/20 1401 01/10/20 1901 01/15/20 1400  Weight: 49 kg 38.7 kg 45.8 kg    Examination:  General exam: Alert and comfortable, not in distress.  Respiratory system: Air entry fair , no wheezing or r honchi.  Cardiovascular system: S1S2, RRR, no JVD.  Gastrointestinal system: Abdomen is SOFT, non tender non distended,bowel sounds minimal. Central nervous system: alert and answering simple questions, able to move  all extremities.  Extremities: no pedal edema.  Skin: no rashes seen Psychiatry: mood is appropriate.    Data Reviewed: I have personally reviewed following labs and imaging studies  CBC: Recent Labs  Lab 01/14/20 0825 01/14/20 1435 01/15/20 0853 01/16/20 0733 01/16/20 1649 01/17/20 0540  WBC 8.0 9.0 8.9 8.9  --  12.6*  HGB 7.0* 7.2* 8.6* 6.8* 8.7* 9.6*  HCT 21.2* 21.9* 26.5* 20.6* 26.6* 28.5*  MCV 91.4 90.9 92.3 90.7  --  88.5  PLT 318 330 425* 443*  --  531*    Basic Metabolic Panel: Recent Labs  Lab 01/11/20 1005 01/12/20 0143 01/14/20 0825  01/15/20 0826 01/15/20 0853 01/16/20 0733 01/17/20 0540  NA  --    < > 141 141 142 138 139  K  --    < > 2.8* 3.0* 3.1* 3.7 3.3*  CL  --    < > 103 100 100 103 104  CO2  --    < > 30 30 30 26 28   GLUCOSE  --    < > 92 346* 74 108* 134*  BUN  --    < > 6* 6* 6* 9 8  CREATININE  --    < > 0.60 0.67 0.71 0.59 0.68  CALCIUM  --    < > 8.2* 8.0* 8.6* 8.4* 8.7*  MG  --   --  1.3*  --  1.7 1.5* 1.8  PHOS 2.8  --   --   --  1.3* 2.2* 2.9   < > = values in this interval not displayed.    GFR: Estimated Creatinine Clearance: 43.9 mL/min (by C-G formula based on SCr of 0.68 mg/dL).  Liver Function Tests: Recent Labs  Lab 01/15/20 0853  AST 32  ALT 17  ALKPHOS 58  BILITOT 0.7  PROT 5.2*  ALBUMIN 2.5*    CBG: Recent Labs  Lab 01/16/20 2053 01/16/20 2354 01/17/20 0346 01/17/20 0611 01/17/20 0829  GLUCAP 109* 118* 142* 125* 100*     Recent Results (from the past 240 hour(s))  Blood culture (routine x 2)     Status: None   Collection Time: 01/10/20  9:33 AM   Specimen: BLOOD  Result Value Ref Range Status   Specimen Description   Final    BLOOD RIGHT ANTECUBITAL Performed at [facility], [address] [place], [place]    Special Requests   Final    BOTTLES DRAWN AEROBIC AND ANAEROBIC Blood Culture results may not be optimal due to an inadequate volume of blood received in culture bottles Performed at [facility], [address] [place], [place]    Culture   Final    NO GROWTH 5 DAYS Performed at [facility], [address] [place], [place]    Report Status 01/15/2020 FINAL  Final  Blood culture (routine x 2)     Status: None   Collection Time: 01/10/20 10:09 AM   Specimen: BLOOD RIGHT FOREARM  Result Value Ref Range Status   Specimen Description   Final    BLOOD RIGHT FOREARM Performed at [facility], [address], [place], [place]    Special Requests   Final     BOTTLES DRAWN AEROBIC AND ANAEROBIC Blood Culture results may not be optimal due to an inadequate volume of blood received in culture bottles Performed at [facility], [address], [place], [place]  Culture   Final    NO GROWTH 5 DAYS Performed at [facility], [address] [place], [place] [address]    Report Status 01/15/2020 FINAL  Final  Resp Panel by RT-PCR (Flu A&B, Covid) Nasopharyngeal Swab     Status: None   Collection Time: 01/10/20 11:09 AM   Specimen: Nasopharyngeal Swab; Nasopharyngeal(NP) swabs in vial transport medium  Result Value Ref Range Status   SARS Coronavirus 2 by RT PCR NEGATIVE NEGATIVE Final    Comment: (NOTE) SARS-CoV-2 target nucleic acids are NOT DETECTED.  The SARS-CoV-2 RNA is generally detectable in upper respiratory specimens during the acute phase of infection. The lowest concentration of SARS-CoV-2 viral copies this assay can detect is 138 copies/mL. A negative result does not preclude SARS-Cov-2 infection and should not be used as the sole basis for treatment or other patient management decisions. A negative result may occur with  improper specimen collection/handling, submission of specimen other than nasopharyngeal swab, presence of viral mutation(s) within the areas targeted by this assay, and inadequate number of viral copies(<138 copies/mL). A negative result must be combined with clinical observations, patient history, and epidemiological information. The expected result is Negative.  Fact Sheet for Patients:  BloggerCourse.com  Fact Sheet for Healthcare Providers:  SeriousBroker.it  This test is no t yet approved or cleared by the [place] FDA and  has been authorized for detection and/or diagnosis of SARS-CoV-2 by FDA under an Emergency Use Authorization (EUA). This EUA will remain  in effect (meaning this test can be used) for the  duration of the COVID-19 declaration under Section 564(b)(1) of the Act, 21 U.S.C.section 360bbb-3(b)(1), unless the authorization is terminated  or revoked sooner.       Influenza A by PCR NEGATIVE NEGATIVE Final   Influenza B by PCR NEGATIVE NEGATIVE Final    Comment: (NOTE) The Xpert Xpress SARS-CoV-2/FLU/RSV plus assay is intended as an aid in the diagnosis of influenza from Nasopharyngeal swab specimens and should not be used as a sole basis for treatment. Nasal washings and aspirates are unacceptable for Xpert Xpress SARS-CoV-2/FLU/RSV testing.  Fact Sheet for Patients: BloggerCourse.com  Fact Sheet for Healthcare Providers: SeriousBroker.it  This test is not yet approved or cleared by the [place] FDA and has been authorized for detection and/or diagnosis of SARS-CoV-2 by FDA under an Emergency Use Authorization (EUA). This EUA will remain in effect (meaning this test can be used) for the duration of the COVID-19 declaration under Section 564(b)(1) of the Act, 21 U.S.C. section 360bbb-3(b)(1), unless the authorization is terminated or revoked.  Performed at [facility], [address] [place], [place] [address]   MRSA PCR Screening     Status: None   Collection Time: 01/10/20 10:30 PM   Specimen: Nasal Mucosa; Nasopharyngeal  Result Value Ref Range Status   MRSA by PCR NEGATIVE NEGATIVE Final    Comment:        The GeneXpert MRSA Assay (FDA approved for NASAL specimens only), is one component of a comprehensive MRSA colonization surveillance program. It is not intended to diagnose MRSA infection nor to guide or monitor treatment for MRSA infections. Performed at [facility], [address] [place], [place] [address]          Radiology Studies: No results found.      Scheduled Meds: . Chlorhexidine Gluconate Cloth  6 each Topical Daily  . dextrose  25  g Intravenous Once  . enoxaparin (LOVENOX) injection  30 mg Subcutaneous Q24H  . influenza vaccine adjuvanted  0.5 mL Intramuscular Tomorrow-1000  . lip balm  1 application Topical BID  . mouth rinse  15 mL Mouth Rinse BID  . meningococcal oligosaccharide  0.5 mL Intramuscular Once  . pantoprazole  40 mg Oral BID  . sodium chloride flush  10-40 mL Intracatheter Q12H  . thiamine  100 mg Oral Daily   Or  . thiamine  100 mg Intravenous Daily   Continuous Infusions: . sodium chloride Stopped (01/13/20 0337)  . dextrose 5% lactated ringers 35 mL/hr at 01/17/20 0300  . methocarbamol (ROBAXIN) IV Stopped (01/17/20 1001)  . ondansetron (ZOFRAN) IV    . TPN ADULT (ION) 40 mL/hr at 01/17/20 0300     LOS: 7 days        Kathlen ModyVijaya [place] Ke, MD [facility]   To contact the attending provider between 7A-7P or the covering provider during after hours 7P-7A, please log into the web site [email or web address] and access using universal [place] password for that web site. If you do not have the password, please call the hospital operator.  01/17/2020, 12:14 PM

## 2020-01-18 DIAGNOSIS — N179 Acute kidney failure, unspecified: Secondary | ICD-10-CM | POA: Diagnosis not present

## 2020-01-18 DIAGNOSIS — K251 Acute gastric ulcer with perforation: Secondary | ICD-10-CM | POA: Diagnosis not present

## 2020-01-18 LAB — MAGNESIUM: Magnesium: 1.7 mg/dL (ref 1.7–2.4)

## 2020-01-18 LAB — BASIC METABOLIC PANEL
Anion gap: 9 (ref 5–15)
BUN: 5 mg/dL — ABNORMAL LOW (ref 8–23)
CO2: 26 mmol/L (ref 22–32)
Calcium: 9.1 mg/dL (ref 8.9–10.3)
Chloride: 101 mmol/L (ref 98–111)
Creatinine, Ser: 0.65 mg/dL (ref 0.44–1.00)
GFR, Estimated: >60 mL/min (ref >60)
Glucose, Bld: 82 mg/dL (ref 70–99)
Potassium: 3.7 mmol/L (ref 3.5–5.1)
Sodium: 136 mmol/L (ref 135–145)

## 2020-01-18 LAB — PHOSPHORUS: Phosphorus: 3.3 mg/dL (ref 2.5–4.6)

## 2020-01-18 NOTE — Progress Notes (Signed)
PROGRESS NOTE    Tracy Barton  DGL:[id number] DOB: [birth year] DOA: 01/10/2020 PCP: Patient, No Pcp Per    Chief Complaint  Patient presents with  . Abdominal Pain    Brief Narrative:  Tracy Barton is a 75 y.o. female with a history of hepatitic C infection, alcohol abuse. Patient presented secondary to abdominal pain and found to have a perforated gastric ulcer requiring emergent surgery. Hospital course complicated by alcohol withdrawal symptoms. Pt is currently on prn haldol. Pt seen and examined. She denies any nausea or abd pain. Able to tolerate liquid diet.   Assessment & Plan:   Principal Problem:   Perforated prepyloric gastric ulcer s/p omental Cheree Ditto patch 01/10/2020 Active Problems:   Smoker   Gastritis and gastroduodenitis   Trigeminal neuralgia   Hep C w/o coma, chronic (HCC)   History of medication noncompliance   ARF (acute renal failure) (HCC)   S/P laparoscopic splenectomy   Protein-calorie malnutrition, severe   Perforated gastric ulcer; S/p splenectomy,  Washout/lysis of adhesions and omental patch.  Further management as per surgery. Pt currently on clear liquid diet and able to tolerate, advanced to soft diet today.    Acute encephalopathy secondary to alcohol withdrawal in the setting of ? Dementia. At home pt is on atarax for anxiety?  Currently she is on IV haldol  Ativan was discontinued as she became sedated from IV ativan.  Pt is more alert and answering questions appropriately. No changes in meds.   AKI:  - resolved    Hypokalemia:  Replaced. Repeat BMP pending.    Hypophosphatemia:  Replaced.    Acute Anemia of blood loss  From surgery superimposed on anemia of chronic disease:  Hemoglobin of 6.8 on 01/16/20 1 unit of prbc transfusion ordered by general surgery. Repeat hemoglobin improved to 9.6 Transfuse to keep hemoglobin greater than 8.   Nutrition:  Advance diet as tolerated.    Metabolic acidosis:  -elevated anion  gap.  Resolved.   Acute metabolic encephalopathy:  - probably from alcohol withdrawal and ativan and dilaudid.  - ativan d/ced. Try to minimize the use of dilaudid.  - she is alert and oriented to person and place.     Hypertensive urgency.  Improved. Resume norvasc 5 mg daily.  On IV hydralazine prn, and metoprolol prn.    Mild leukocytosis.  Improving.      DVT prophylaxis: Lovenox Code Status: (Full code) Family Communication: none at bedside Disposition:  Per primary service.    Antimicrobials: NOne.    Subjective:  Alert and comfortable.   Objective: Vitals:   01/17/20 1300 01/17/20 1321 01/17/20 2132 01/18/20 0549  BP:  (!) 171/66 (!) 152/70 (!) 150/76  Pulse:  66 63 (!) 58  Resp:  18 16 16   Temp:  (!) 97.4 F (36.3 C) 98.1 F (36.7 C) 99.1 F (37.3 C)  TempSrc:  Oral Oral Oral  SpO2: 96% 100% 99% 99%  Weight:      Height:        Intake/Output Summary (Last 24 hours) at 01/18/2020 1237 Last data filed at 01/18/2020 1235 Gross per 24 hour  Intake 1660 ml  Output 2400 ml  Net -740 ml   Filed Weights   01/10/20 1401 01/10/20 1901 01/15/20 1400  Weight: 49 kg 38.7 kg 45.8 kg    Examination:  General exam: alert, not in distress.  Respiratory system: Aair entry fair, no wheezing or rhonchi.  Cardiovascular system: S1S2, BRADYCARDIC, no JVD,  Gastrointestinal system:  Abdomen is soft, non tender non distended bowel sounds wnl.  Central nervous system: alert and answering simple questions.  Extremities: No pedal edema.  Skin: No rashes seen.  Psychiatry: mood is appropriate.    Data Reviewed: I have personally reviewed following labs and imaging studies  CBC: Recent Labs  Lab 01/14/20 0825 01/14/20 1435 01/15/20 0853 01/16/20 0733 01/16/20 1649 01/17/20 0540  WBC 8.0 9.0 8.9 8.9  --  12.6*  HGB 7.0* 7.2* 8.6* 6.8* 8.7* 9.6*  HCT 21.2* 21.9* 26.5* 20.6* 26.6* 28.5*  MCV 91.4 90.9 92.3 90.7  --  88.5  PLT 318 330 425* 443*  --  531*     Basic Metabolic Panel: Recent Labs  Lab 01/14/20 0825 01/15/20 0826 01/15/20 0853 01/16/20 0733 01/17/20 0540  NA 141 141 142 138 139  K 2.8* 3.0* 3.1* 3.7 3.3*  CL 103 100 100 103 104  CO2 30 30 30 26 28   GLUCOSE 92 346* 74 108* 134*  BUN 6* 6* 6* 9 8  CREATININE 0.60 0.67 0.71 0.59 0.68  CALCIUM 8.2* 8.0* 8.6* 8.4* 8.7*  MG 1.3*  --  1.7 1.5* 1.8  PHOS  --   --  1.3* 2.2* 2.9    GFR: Estimated Creatinine Clearance: 43.9 mL/min (by C-G formula based on SCr of 0.68 mg/dL).  Liver Function Tests: Recent Labs  Lab 01/15/20 0853  AST 32  ALT 17  ALKPHOS 58  BILITOT 0.7  PROT 5.2*  ALBUMIN 2.5*    CBG: Recent Labs  Lab 01/16/20 2354 01/17/20 0346 01/17/20 0611 01/17/20 0829 01/17/20 1258  GLUCAP 118* 142* 125* 100* 135*     Recent Results (from the past 240 hour(s))  Blood culture (routine x 2)     Status: None   Collection Time: 01/10/20  9:33 AM   Specimen: BLOOD  Result Value Ref Range Status   Specimen Description   Final    BLOOD RIGHT ANTECUBITAL Performed at [facility], [address] [place], [place] [address]    Special Requests   Final    BOTTLES DRAWN AEROBIC AND ANAEROBIC Blood Culture results may not be optimal due to an inadequate volume of blood received in culture bottles Performed at [facility], [address] [place], [place] [address]    Culture   Final    NO GROWTH 5 DAYS Performed at [facility], [address] [place], [place] [address]    Report Status 01/15/2020 FINAL  Final  Blood culture (routine x 2)     Status: None   Collection Time: 01/10/20 10:09 AM   Specimen: BLOOD RIGHT FOREARM  Result Value Ref Range Status   Specimen Description   Final    BLOOD RIGHT FOREARM Performed at [facility], [address] [place], [place] [address]    Special Requests   Final    BOTTLES DRAWN AEROBIC AND ANAEROBIC Blood Culture results may not be optimal due to an  inadequate volume of blood received in culture bottles Performed at [facility], [address] [place], [place] [address]    Culture   Final    NO GROWTH 5 DAYS Performed at [facility], [address] [place], [place] [address]    Report Status 01/15/2020 FINAL  Final  Resp Panel by RT-PCR (Flu A&B, Covid) Nasopharyngeal Swab     Status: None   Collection Time: 01/10/20 11:09 AM   Specimen: Nasopharyngeal Swab; Nasopharyngeal(NP)  swabs in vial transport medium  Result Value Ref Range Status   SARS Coronavirus 2 by RT PCR NEGATIVE NEGATIVE Final    Comment: (NOTE) SARS-CoV-2 target nucleic acids are NOT DETECTED.  The SARS-CoV-2 RNA is generally detectable in upper respiratory specimens during the acute phase of infection. The lowest concentration of SARS-CoV-2 viral copies this assay can detect is 138 copies/mL. A negative result does not preclude SARS-Cov-2 infection and should not be used as the sole basis for treatment or other patient management decisions. A negative result may occur with  improper specimen collection/handling, submission of specimen other than nasopharyngeal swab, presence of viral mutation(s) within the areas targeted by this assay, and inadequate number of viral copies(<138 copies/mL). A negative result must be combined with clinical observations, patient history, and epidemiological information. The expected result is Negative.  Fact Sheet for Patients:  BloggerCourse.com  Fact Sheet for Healthcare Providers:  SeriousBroker.it  This test is no t yet approved or cleared by the [place] FDA and  has been authorized for detection and/or diagnosis of SARS-CoV-2 by FDA under an Emergency Use Authorization (EUA). This EUA will remain  in effect (meaning this test can be used) for the duration of the COVID-19 declaration under Section 564(b)(1) of the Act, 21 U.S.C.section  360bbb-3(b)(1), unless the authorization is terminated  or revoked sooner.       Influenza A by PCR NEGATIVE NEGATIVE Final   Influenza B by PCR NEGATIVE NEGATIVE Final    Comment: (NOTE) The Xpert Xpress SARS-CoV-2/FLU/RSV plus assay is intended as an aid in the diagnosis of influenza from Nasopharyngeal swab specimens and should not be used as a sole basis for treatment. Nasal washings and aspirates are unacceptable for Xpert Xpress SARS-CoV-2/FLU/RSV testing.  Fact Sheet for Patients: BloggerCourse.com  Fact Sheet for Healthcare Providers: SeriousBroker.it  This test is not yet approved or cleared by the [place] FDA and has been authorized for detection and/or diagnosis of SARS-CoV-2 by FDA under an Emergency Use Authorization (EUA). This EUA will remain in effect (meaning this test can be used) for the duration of the COVID-19 declaration under Section 564(b)(1) of the Act, 21 U.S.C. section 360bbb-3(b)(1), unless the authorization is terminated or revoked.  Performed at [facility], [address] [place], [place] [address]   MRSA PCR Screening     Status: None   Collection Time: 01/10/20 10:30 PM   Specimen: Nasal Mucosa; Nasopharyngeal  Result Value Ref Range Status   MRSA by PCR NEGATIVE NEGATIVE Final    Comment:        The GeneXpert MRSA Assay (FDA approved for NASAL specimens only), is one component of a comprehensive MRSA colonization surveillance program. It is not intended to diagnose MRSA infection nor to guide or monitor treatment for MRSA infections. Performed at [facility], [address] [place], [place] [address]          Radiology Studies: No results found.      Scheduled Meds: . amLODipine  5 mg Oral Daily  . Chlorhexidine Gluconate Cloth  6 each Topical Daily  . dextrose  25 g Intravenous Once  . enoxaparin (LOVENOX) injection  30 mg  Subcutaneous Q24H  . influenza vaccine adjuvanted  0.5 mL Intramuscular Tomorrow-1000  . lip balm  1 application Topical BID  . mouth rinse  15 mL Mouth Rinse BID  . meningococcal oligosaccharide  0.5 mL Intramuscular Once  . pantoprazole  40 mg Oral BID  . sodium  chloride flush  10-40 mL Intracatheter Q12H  . thiamine  100 mg Oral Daily   Or  . thiamine  100 mg Intravenous Daily   Continuous Infusions: . sodium chloride Stopped (01/13/20 0337)  . dextrose 5% lactated ringers 35 mL/hr at 01/17/20 0300  . methocarbamol (ROBAXIN) IV 500 mg (01/18/20 0755)  . ondansetron (ZOFRAN) IV       LOS: 8 days        Kathlen Mody, MD [facility]   To contact the attending provider between 7A-7P or the covering provider during after hours 7P-7A, please log into the web site [email or web address] and access using universal [place] password for that web site. If you do not have the password, please call the hospital operator.  01/18/2020, 12:37 PM

## 2020-01-18 NOTE — Progress Notes (Signed)
     Assessment & Plan: POD#8 Perforated gastric ulcer S/p laparoscopic graham patch, laparoscopic splenectomy - 01/10/20 Dr. [name]  Tolerating clear liquid diet - advance to soft diet today  PT/OT  Mental status marginal - continue restraints as needed per nursing  May switch to po meds  Tobacco abuse Alcohol abuse Hx of trigeminal neuralgia  Chronic Hepatitis C Severe protein calorie malnutrition         Darnell Level, MD       [facility]       Office: [phone]   Chief Complaint: Perforated ulcer  Subjective: Patient more alert, communicative.  Pulling at lines.  Wants to eat.  Objective: Vital signs in last 24 hours: Temp:  [97.4 F (36.3 C)-99.1 F (37.3 C)] 99.1 F (37.3 C) (01/16 0549) Pulse Rate:  [58-69] 58 (01/16 0549) Resp:  [14-18] 16 (01/16 0549) BP: (150-177)/(66-79) 150/76 (01/16 0549) SpO2:  [96 %-100 %] 99 % (01/16 0549) Last BM Date: 01/18/20  Intake/Output from previous day: 01/15 0701 - 01/16 0700 In: 1533.1 [P.O.:960; I.V.:573.1] Out: 2200 [Urine:2200] Intake/Output this shift: Total I/O In: 340 [P.O.:240; IV Piggyback:100] Out: -   Physical Exam: HEENT - sclerae clear, mucous membranes moist Neck - soft Abdomen - soft without distension; wounds dry and intact Ext - no edema, non-tender  Lab Results:  Recent Labs    01/16/20 0733 01/16/20 1649 01/17/20 0540  WBC 8.9  --  12.6*  HGB 6.8* 8.7* 9.6*  HCT 20.6* 26.6* 28.5*  PLT 443*  --  531*   BMET Recent Labs    01/16/20 0733 01/17/20 0540  NA 138 139  K 3.7 3.3*  CL 103 104  CO2 26 28  GLUCOSE 108* 134*  BUN 9 8  CREATININE 0.59 0.68  CALCIUM 8.4* 8.7*   PT/INR No results for input(s): LABPROT, INR in the last 72 hours. Comprehensive Metabolic Panel:    Component Value Date/Time   NA 139 01/17/2020 0540   NA 138 01/16/2020 0733   K 3.3 (L) 01/17/2020 0540   K 3.7 01/16/2020 0733   CL 104 01/17/2020 0540   CL 103 01/16/2020 0733   CO2 28  01/17/2020 0540   CO2 26 01/16/2020 0733   BUN 8 01/17/2020 0540   BUN 9 01/16/2020 0733   CREATININE 0.68 01/17/2020 0540   CREATININE 0.59 01/16/2020 0733   CREATININE 0.86 09/21/2014 1214   CREATININE 0.80 04/15/2014 1238   GLUCOSE 134 (H) 01/17/2020 0540   GLUCOSE 108 (H) 01/16/2020 0733   CALCIUM 8.7 (L) 01/17/2020 0540   CALCIUM 8.4 (L) 01/16/2020 0733   AST 32 01/15/2020 0853   AST 29 01/10/2020 0825   ALT 17 01/15/2020 0853   ALT 8 01/10/2020 0825   ALKPHOS 58 01/15/2020 0853   ALKPHOS 74 01/10/2020 0825   BILITOT 0.7 01/15/2020 0853   BILITOT 1.0 01/10/2020 0825   PROT 5.2 (L) 01/15/2020 0853   PROT 6.9 01/10/2020 0825   ALBUMIN 2.5 (L) 01/15/2020 0853   ALBUMIN 3.9 01/10/2020 0825    Studies/Results: No results found.    Darnell Level 01/18/2020  Patient ID: Tracy Barton, female   DOB: 1945-05-06, 75 y.o.   MRN: [id number]

## 2020-01-19 DIAGNOSIS — N179 Acute kidney failure, unspecified: Secondary | ICD-10-CM | POA: Diagnosis not present

## 2020-01-19 DIAGNOSIS — K251 Acute gastric ulcer with perforation: Secondary | ICD-10-CM | POA: Diagnosis not present

## 2020-01-19 LAB — VITAMIN B12: Vitamin B-12: 483 pg/mL (ref 180–914)

## 2020-01-19 LAB — AMMONIA: Ammonia: 30 umol/L (ref 9–35)

## 2020-01-19 MED ORDER — METHOCARBAMOL 500 MG PO TABS
750.0000 mg | ORAL_TABLET | Freq: Three times a day (TID) | ORAL | Status: DC
  Administered 2020-01-19 – 2020-01-22 (×7): 750 mg via ORAL
  Filled 2020-01-19 (×7): qty 2

## 2020-01-19 MED ORDER — TRAMADOL HCL 50 MG PO TABS
50.0000 mg | ORAL_TABLET | Freq: Four times a day (QID) | ORAL | Status: DC | PRN
  Administered 2020-01-20: 10:00:00 50 mg via ORAL
  Filled 2020-01-19 (×2): qty 1

## 2020-01-19 MED ORDER — HYDROMORPHONE HCL 1 MG/ML IJ SOLN: 0.5000 mg | Freq: Three times a day (TID) | INTRAMUSCULAR | Status: DC | PRN

## 2020-01-19 MED ORDER — LIDOCAINE 5 % EX PTCH
1.0000 | MEDICATED_PATCH | CUTANEOUS | Status: DC
  Administered 2020-01-19 – 2020-01-23 (×5): 1 via TRANSDERMAL
  Filled 2020-01-19 (×5): qty 1

## 2020-01-19 MED ORDER — QUETIAPINE FUMARATE 25 MG PO TABS
25.0000 mg | ORAL_TABLET | Freq: Every day | ORAL | Status: DC
  Administered 2020-01-19 – 2020-01-22 (×4): 25 mg via ORAL
  Filled 2020-01-19 (×4): qty 1

## 2020-01-19 MED ORDER — HALOPERIDOL LACTATE 5 MG/ML IJ SOLN
2.0000 mg | Freq: Four times a day (QID) | INTRAMUSCULAR | Status: DC | PRN
  Administered 2020-01-19 – 2020-01-20 (×2): 5 mg via INTRAMUSCULAR
  Filled 2020-01-19 (×3): qty 1

## 2020-01-19 MED ORDER — ACETAMINOPHEN 325 MG PO TABS
650.0000 mg | ORAL_TABLET | Freq: Four times a day (QID) | ORAL | Status: DC
  Administered 2020-01-19 – 2020-01-23 (×12): 650 mg via ORAL
  Filled 2020-01-19 (×12): qty 2

## 2020-01-19 NOTE — Progress Notes (Signed)
Physical Therapy Treatment Patient Details Name: Tracy Barton MRN: [id number] DOB: [birth year] Today's Date: 01/19/2020    History of Present Illness Patient is a 75 year old female PMH of hepatitic C infection, alcohol abuse. Patient presented secondary to abdominal pain and found to have a perforated gastric ulcer s/p omental Cheree Ditto patch 01/10/2020    PT Comments    Pt pleasant and cooperative today.  Pt ambulated in hallway with RW and requiring occasional min assist.  Pt reports having no true assist available at home and typically does not use RW.  Continue to recommend SNF upon d/c   Follow Up Recommendations  SNF     Equipment Recommendations  Rolling walker with 5" wheels    Recommendations for Other Services       Precautions / Restrictions Precautions Precautions: Fall Precaution Comments: wrist restraints    Mobility  Bed Mobility Overal bed mobility: Needs Assistance Bed Mobility: Supine to Sit     Supine to sit: Supervision     General bed mobility comments: supervision for safety; pt attempting to mobilize however has wrist restraints and required cues to wait and allow therapist to prepare  Transfers Overall transfer level: Needs assistance Equipment used: Rolling walker (2 wheeled) Transfers: Sit to/from Stand Sit to Stand: Min assist         General transfer comment: assist to steady with rise, cues for hand placement  Ambulation/Gait Ambulation/Gait assistance: Min assist;Min guard Gait Distance (Feet): 300 Feet Assistive device: Rolling walker (2 wheeled) Gait Pattern/deviations: Step-through pattern;Decreased stride length     General Gait Details: verbal cues for use of RW and negotiating around objects, initial assist for steadying however improved   Stairs             Wheelchair Mobility    Modified Rankin (Stroke Patients Only)       Balance Overall balance assessment: Needs assistance Sitting-balance support:  Feet unsupported;No upper extremity supported Sitting balance-Leahy Scale: Fair     Standing balance support: Bilateral upper extremity supported Standing balance-Leahy Scale: Poor Standing balance comment: reliant on at least one UE support                            Cognition Arousal/Alertness: Awake/alert Behavior During Therapy: WFL for tasks assessed/performed Overall Cognitive Status: No family/caregiver present to determine baseline cognitive functioning                                 General Comments: pt alert and orientated x3, pleasant and cooperative today      Exercises      General Comments        Pertinent Vitals/Pain Pain Assessment: No/denies pain    Home Living Family/patient expects to be discharged to:: Private residence Living Arrangements: Other (Comment) (significant other who she reports she is "putting out") Available Help at Discharge: Family;Available PRN/intermittently       Home Layout: One level Home Equipment: None      Prior Function Level of Independence: Independent          PT Goals (current goals can now be found in the care plan section) Progress towards PT goals: Progressing toward goals    Frequency    Min 2X/week      PT Plan Current plan remains appropriate    Co-evaluation  AM-PAC PT "6 Clicks" Mobility   Outcome Measure  Help needed turning from your back to your side while in a flat bed without using bedrails?: A Little Help needed moving from lying on your back to sitting on the side of a flat bed without using bedrails?: A Little Help needed moving to and from a bed to a chair (including a wheelchair)?: A Little Help needed standing up from a chair using your arms (e.g., wheelchair or bedside chair)?: A Little Help needed to walk in hospital room?: A Little Help needed climbing 3-5 steps with a railing? : A Little 6 Click Score: 18    End of Session Equipment  Utilized During Treatment: Gait belt Activity Tolerance: Patient tolerated treatment well Patient left: in chair;with call bell/phone within reach;with nursing/sitter in room;with chair alarm set (nurse to address wrist restraints) Nurse Communication: Mobility status PT Visit Diagnosis: Unsteadiness on feet (R26.81);Difficulty in walking, not elsewhere classified (R26.2)     Time: 8416-6063 PT Time Calculation (min) (ACUTE ONLY): 13 min  Charges:  $Gait Training: 8-22 mins                     Thomasene Mohair PT, DPT Acute Rehabilitation Services Pager: [phone] Office: [phone]  Maida Sale E 01/19/2020, 12:59 PM

## 2020-01-19 NOTE — Progress Notes (Signed)
  Speech Language Pathology Treatment: Dysphagia  Patient Details Name: Tracy Barton MRN: [id number] DOB: Jul 24, 1945 Today's Date: 01/19/2020 Time: 0840-0900 SLP Time Calculation (min) (ACUTE ONLY): 20 min  Assessment / Plan / Recommendation Clinical Impression  Patient seen to address dysphagia goals and assess toleration of current diet. Of note, when SLP initially evaluated patient via BSE, she was only cleared for clear liquids by GI but currently her diet has been adjusted to Soft, thin liquids. Per RN, patient has been tolerating solids and liquids well. Patient is pleasantly confused, telling SLP that "I walked here yesterday". She also denies having had any solid foods. She self-fed chocolate ice cream and water via straw sips and did exhibit occasional dry sounding cough response. Voice remained clear throughout. Patient reported "I always cough when I eat cold things". But RN told SLP "that cough is new". Patient is likely not a good historian however coughing with cold boluses would be possible s/s esophageal dysphagia as it can trigger esophageal spasms. SLP recommended RN monitor patient's toleration and contact SLP if any concerns. SLP will continue to follow patient for diet toleration.   HPI HPI: 75 yo female adm to [facility] with Perforated prepyloric gastric ulcer s/p omental Cheree Ditto patch 01/10/2020    PMH + Smoker    Gastritis and gastroduodenitis    Trigeminal neuralgia    Hep C w/o coma, chronic (HCC)    History of medication noncompliance    ARF (acute renal failure) (HCC)    S/P laparoscopic splenectomy    Protein-calorie malnutrition, severe.  Swallow eval ordered.      SLP Plan  Continue with current plan of care       Recommendations  Diet recommendations: Dysphagia 3 (mechanical soft);Thin liquid Liquids provided via: Cup;Straw Medication Administration: Whole meds with puree Supervision: Patient able to self feed;Intermittent supervision to cue for compensatory  strategies Compensations: Slow rate;Small sips/bites Postural Changes and/or Swallow Maneuvers: Seated upright 90 degrees;Upright 30-60 min after meal                Oral Care Recommendations: Oral care BID Follow up Recommendations: None SLP Visit Diagnosis: Dysphagia, unspecified (R13.10) Plan: Continue with current plan of care       GO               Angela Nevin, MA, CCC-SLP Speech Therapy

## 2020-01-19 NOTE — TOC Progression Note (Addendum)
Transition of Care [facility]) - Progression Note    Patient Details  Name: Tracy Barton MRN: [id number] Date of Birth: 12/12/1945  [facility] CM/SW Contact  Clearance Coots, LCSW Phone Number: 01/19/2020, 12:35 PM  Clinical Narrative:    Re: SNF CSW reached out to the patient son Dorinda Hill and daughter Cherly Hensen to discuss patient disposition. CSW explain PT recommendation for short rehab and SNF process. Both agreeable to send the patient to SNF for short rehab. Son reports the patient lives with her boyfriend. The patient is usually independent with ADL's and does not use a device to walk. She states the patient had "very little confusion compared to now." Daughter usually assist with the patient with meal preparation. Son reports the patient will stay him temporarily after her rehab stay.  FL2 completed.  SNF will initiate [employer]. Authorization once a SNF bed is chosen    Expected Discharge Plan: Skilled Nursing Facility Barriers to Discharge: Continued Medical Work up,Insurance Authorization,Other (comment)  Expected Discharge Plan and Services Expected Discharge Plan: Skilled Nursing Facility   Discharge Planning Services: CM Consult   Living arrangements for the past 2 months: Apartment                                       Social Determinants of Health (SDOH) Interventions    Readmission Risk Interventions No flowsheet data found.

## 2020-01-19 NOTE — Discharge Instructions (Signed)
Peptic Ulcer  A peptic ulcer is a sore in the lining of the stomach (gastric ulcer) or the first part of the small intestine (duodenal ulcer). The ulcer causes a gradual wearing away (erosion) of the deeper tissue. What are the causes? Normally, the lining of the stomach and the small intestine protects them from the acid that digests food. The protective lining can be damaged by:  An infection caused by a type of bacteria called Helicobacter pylori or H. pylori.  Regular use of NSAIDs, such as ibuprofen or aspirin.  Rare tumors in the stomach, small intestine, or pancreas (Zollinger-Ellison syndrome). What increases the risk? The following factors may make you more likely to develop this condition:  Smoking.  Having a family history of ulcer disease.  Drinking alcohol.  Having been hospitalized in an intensive care unit (ICU). What are the signs or symptoms? Symptoms of this condition include:  Persistent burning pain in the area between the chest and the belly button. The pain may be worse on an empty stomach and at night.  Heartburn.  Nausea and vomiting.  Bloating. If the ulcer results in bleeding, it can cause:  Black, tarry stools.  Vomiting of bright red blood.  Vomiting of material that looks like coffee grounds. How is this diagnosed? This condition may be diagnosed based on:  Your medical history and a physical exam.  Various tests or procedures, such as: ? Blood tests, stool tests, or breath tests to check for the H. pylori bacteria. ? An X-ray exam (upper gastrointestinal series) of the esophagus, stomach, and small intestine. ? Upper endoscopy. The health care provider examines the esophagus, stomach, and small intestine using a small flexible tube that has a video camera at the end. ? Biopsy. A tissue sample is removed to be examined under a microscope. How is this treated? Treatment for this condition may include:  Eliminating the cause of the ulcer,  such as smoking or use of NSAIDs, and limiting alcohol and caffeine intake.  Medicines to reduce the amount of acid in your digestive tract.  Antibiotic medicines, if the ulcer is caused by an H. pylori infection.  An upper endoscopy may be used to treat a bleeding ulcer.  Surgery. This may be needed if the bleeding is severe or if the ulcer created a hole somewhere in the digestive system. Follow these instructions at home:  Do not drink alcohol if your health care provider tells you not to drink.  Do not use any products that contain nicotine or tobacco, such as cigarettes, e-cigarettes, and chewing tobacco. If you need help quitting, ask your health care provider.  Take over-the-counter and prescription medicines only as told by your health care provider. ? Do not use over-the-counter medicines in place of prescription medicines unless your health care provider approves. ? Do not take aspirin, ibuprofen, or other NSAIDs unless your health care provider told you to do so.  Take over-the-counter and prescription medicines only as told by your health care provider.  Keep all follow-up visits as told by your health care provider. This is important. Contact a health care provider if:  Your symptoms do not improve within 7 days of starting treatment.  You have ongoing indigestion or heartburn. Get help right away if:  You have sudden, sharp, or persistent pain in your abdomen.  You have bloody or dark black, tarry stools.  You vomit blood or material that looks like coffee grounds.  You become light-headed or you feel faint.  You become weak.  You become sweaty or clammy. Summary  A peptic ulcer is a sore in the lining of the stomach (gastric ulcer) or the first part of the small intestine (duodenal ulcer). The ulcer causes a gradual wearing away (erosion) of the deeper tissue.  Do not use any products that contain nicotine or tobacco, such as cigarettes, e-cigarettes, and  chewing tobacco. If you need help quitting, ask your health care provider.  Take over-the-counter and prescription medicines only as told by your health care provider. Do not use over-the-counter medicines in place of prescription medicines unless your health care provider approves.  Contact your health care provider if you have ongoing indigestion or heartburn.  Keep all follow-up visits as told by your health care provider. This is important. This information is not intended to replace advice given to you by your health care provider. Make sure you discuss any questions you have with your health care provider. Document Revised: 06/26/2017 Document Reviewed: 06/26/2017 Elsevier Patient Education  2020 Elsevier Inc.   LAPAROSCOPIC SURGERY: POST OP INSTRUCTIONS  ######################################################################  EAT Gradually transition to a high fiber diet with a fiber supplement over the next few weeks after discharge.  Start with a pureed / full liquid diet (see below)  WALK Walk an hour a day.  Control your pain to do that.    CONTROL PAIN Control pain so that you can walk, sleep, tolerate sneezing/coughing, go up/down stairs.  HAVE A BOWEL MOVEMENT DAILY Keep your bowels regular to avoid problems.  OK to try a laxative to override constipation.  OK to use an antidairrheal to slow down diarrhea.  Call if not better after 2 tries  CALL IF YOU HAVE PROBLEMS/CONCERNS Call if you are still struggling despite following these instructions. Call if you have concerns not answered by these instructions  ######################################################################    1. DIET: Follow a light bland diet & liquids the first 24 hours after arrival home, such as soup, liquids, starches, etc.  Be sure to drink plenty of fluids.  Quickly advance to a usual solid diet within a few days.  Avoid fast food or heavy meals as your are more likely to get nauseated or  have irregular bowels.  A low-fat, high-fiber diet for the rest of your life is ideal.  2. Take your usually prescribed home medications unless otherwise directed.  3. PAIN CONTROL: a. Pain is best controlled by a usual combination of three different methods TOGETHER: i. Ice/Heat ii. Over the counter pain medication iii. Prescription pain medication b. Most patients will experience some swelling and bruising around the incisions.  Ice packs or heating pads (30-60 minutes up to 6 times a day) will help. Use ice for the first few days to help decrease swelling and bruising, then switch to heat to help relax tight/sore spots and speed recovery.  Some people prefer to use ice alone, heat alone, alternating between ice & heat.  Experiment to what works for you.  Swelling and bruising can take several weeks to resolve.   c. It is helpful to take an over-the-counter pain medication regularly for the first few weeks.  Choose one of the following that works best for you: i. Naproxen (Aleve, etc)  Two 220mg  tabs twice a day ii. Ibuprofen (Advil, etc) Three 200mg  tabs four times a day (every meal & bedtime) iii. Acetaminophen (Tylenol, etc) 500-650mg  four times a day (every meal & bedtime) d. A  prescription for pain medication (such as oxycodone, hydrocodone, tramadol,  gabapentin, methocarbamol, etc) should be given to you upon discharge.  Take your pain medication as prescribed.  i. If you are having problems/concerns with the prescription medicine (does not control pain, nausea, vomiting, rash, itching, etc), please call us [phone] to see if we need to switch you to a different pain medicine that will work better for you and/or control your side effect better. ii. If you need a refill on your pain medication, please give [place] 48 hour notice.  contact your pharmacy.  They will contact our office to request authorization. Prescriptions will not be filled after 5 pm or on week-ends  4. Avoid getting  constipated.   a. Between the surgery and the pain medications, it is common to experience some constipation.   b. Increasing fluid intake and taking a fiber supplement (such as Metamucil, Citrucel, FiberCon, MiraLax, etc) 1-2 times a day regularly will usually help prevent this problem from occurring.   c. A mild laxative (prune juice, Milk of Magnesia, MiraLax, etc) should be taken according to package directions if there are no bowel movements after 48 hours.   5. Watch out for diarrhea.   a. If you have many loose bowel movements, simplify your diet to bland foods & liquids for a few days.   b. Stop any stool softeners and decrease your fiber supplement.   c. Switching to mild anti-diarrheal medications (Kayopectate, Pepto Bismol) can help.   d. If this worsens or does not improve, please call us.  6. Wash / shower every day.  You may shower over the dressings as they are waterproof.  Continue to shower over incision(s) after the dressing is off.  7. Remove your waterproof bandages 3 days after surgery.  You may leave the incision open to air.  You may replace a dressing/Band-Aid to cover the incision for comfort if you wish.   8. ACTIVITIES as tolerated:   i. You may resume regular (light) daily activities beginning the next day--such as daily self-care, walking, climbing stairs--gradually increasing activities as tolerated.  If you can walk 30 minutes without difficulty, it is safe to try more intense activity such as jogging, treadmill, bicycling, low-impact aerobics, swimming, etc. ii. Save the most intensive and strenuous activity for last such as sit-ups, heavy lifting, contact sports, etc  Refrain from any heavy lifting or straining until you are off narcotics for pain control.   iii. DO NOT PUSH THROUGH PAIN.  Let pain be your guide: If it hurts to do something, don't do it.  Pain is your body warning you to avoid that activity for another week until the pain goes down. iv. You may  drive when you are no longer taking prescription pain medication, you can comfortably wear a seatbelt, and you can safely maneuver your car and apply brakes. v. You may have sexual intercourse when it is comfortable.  9. FOLLOW UP in our office a. Please call [facility] at [phone] to set up an appointment to see your surgeon in the office for a follow-up appointment approximately 2-3 weeks after your surgery. b. Make sure that you call for this appointment the day you arrive home to insure a convenient appointment time.  10. IF YOU HAVE DISABILITY OR FAMILY LEAVE FORMS, BRING THEM TO THE OFFICE FOR PROCESSING.  DO NOT GIVE THEM TO YOUR DOCTOR.   WHEN TO CALL us [phone]: 1. Poor pain control 2. Reactions / problems with new medications (rash/itching, nausea, etc)  3. Fever over 101.5  F (38.5 C) 4. Inability to urinate 5. Nausea and/or vomiting 6. Worsening swelling or bruising 7. Continued bleeding from incision. 8. Increased pain, redness, or drainage from the incision   The clinic staff is available to answer your questions during regular business hours (8:30am-5pm).  Please dont hesitate to call and ask to speak to one of our nurses for clinical concerns.   If you have a medical emergency, go to the nearest emergency room or call 911.  A surgeon from [facility] is always on call at the [facility], [place] [address], [address], [place], [place]  [address] ? MAIN: [phone] ? TOLL FREE: [phone] ?  FAX [phone] [email or web address]

## 2020-01-19 NOTE — Progress Notes (Signed)
Located pt's son's ph number  Tracy Barton [phone]

## 2020-01-19 NOTE — [place] FL2 (Signed)
[place] MEDICAID FL2 LEVEL OF CARE SCREENING TOOL     IDENTIFICATION  Patient Name: Tracy Barton Birthdate: 03-31-1945 Sex: female Admission Date (Current Location): 01/10/2020  [facility] and [place] Number:  Producer, television/film/video and Address:  [facility],  [address], [place] [address]      Provider Number: [id number]  Attending Physician Name and Address:  Montez Morita Md, MD  Relative Name and Phone Number:  tamatha, gadbois   [phone], Ronnette Juniper Daughter [phone]    Current Level of Care: Hospital Recommended Level of Care: Skilled Nursing Facility Prior Approval Number:    Date Approved/Denied:   PASRR Number:    [id number]  Discharge Plan: SNF    Current Diagnoses: Patient Active Problem List   Diagnosis Date Noted  . Protein-calorie malnutrition, severe 01/15/2020  . Perforated prepyloric gastric ulcer s/p omental Cheree Ditto patch 01/10/2020 01/10/2020  . History of medication noncompliance 01/10/2020  . ARF (acute renal failure) (HCC) 01/10/2020  . 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 Hep C w/o coma, chronic (HCC) 09/12/2014  . Head injury 09/11/2014  . Back injury 04/15/2014  . Smoker 03/24/2013  . Gastritis and gastroduodenitis 03/24/2013  . Loss of weight 03/24/2013  . Unspecified vitamin D deficiency 03/24/2013  . Trigeminal neuralgia 06/01/2011    Orientation RESPIRATION BLADDER Height & Weight     Self  Normal Continent Weight: 100 lb 15.5 oz (45.8 kg) Height:  5\' 4"  (162.6 cm)  BEHAVIORAL SYMPTOMS/MOOD NEUROLOGICAL BOWEL NUTRITION STATUS      Continent Diet (Soft Diet)  AMBULATORY STATUS COMMUNICATION OF NEEDS Skin   Extensive Assist Verbally Surgical wounds                       Personal Care Assistance Level of Assistance  Bathing,Feeding,Dressing Bathing Assistance: Limited assistance Feeding assistance: Independent Dressing Assistance: Limited assistance     Functional Limitations Info   Sight,Hearing,Speech Sight Info: Impaired (Wears Glasses) Hearing Info: Adequate Speech Info: Adequate    SPECIAL CARE FACTORS FREQUENCY  PT (By licensed PT),OT (By licensed OT)     PT Frequency: 5x/week OT Frequency: 5x/week            Contractures Contractures Info: Not present    Additional Factors Info  Code Status,Allergies,Psychotropic Code Status Info: Fullcode Allergies Info: Allergies: Nsaids Psychotropic Info: Seroquel         Current Medications (01/19/2020):  This is the current hospital active medication list Current Facility-Administered Medications  Medication Dose Route Frequency Provider Last Rate Last Admin  . 0.9 %  sodium chloride infusion   Intravenous Q8H PRN 01/21/2020, MD   Stopped at 01/13/20 [phone]  . acetaminophen (TYLENOL) tablet 650 mg  650 mg Oral Q6H Simaan, Elizabeth S, PA-C      . alum & mag hydroxide-simeth (MAALOX/MYLANTA) 200-200-20 MG/5ML suspension 30 mL  30 mL Oral Q6H PRN 07-11-2000, MD      . amLODipine (NORVASC) tablet 5 mg  5 mg Oral Daily Karie Soda, MD   5 mg at 01/18/20 1052  . bisacodyl (DULCOLAX) suppository 10 mg  10 mg Rectal Q12H PRN 01/20/20, MD      . Chlorhexidine Gluconate Cloth 2 % PADS 6 each  6 each Topical Daily Karie Soda, MD   6 each at 01/18/20 1557  . dextrose 50 % solution 25 g  25 g Intravenous Once 01/20/20 R, PA-C      . enoxaparin (LOVENOX) injection 30 mg  30 mg Subcutaneous Q24H Juliet Rude, PA-C   30 mg at 01/18/20 1554  . haemophilus B polysaccharide conjugate vaccine (ActHIB) injection 0.5 mL  0.5 mL Intramuscular Prior to discharge Karie Soda, MD      . haloperidol lactate (HALDOL) injection 2-5 mg  2-5 mg Intramuscular Q6H PRN Kathlen Mody, MD      . influenza vaccine adjuvanted (FLUAD) injection 0.5 mL  0.5 mL Intramuscular Tomorrow-1000 Karie Soda, MD      . lidocaine (LIDODERM) 5 % 1 patch  1 patch Transdermal Q24H Simaan, Elizabeth S, PA-C      . lip balm (CARMEX)  ointment 1 application  1 application Topical BID Karie Soda, MD   1 application at 01/18/20 2210  . magic mouthwash  15 mL Oral QID PRN Karie Soda, MD      . MEDLINE mouth rinse  15 mL Mouth Rinse BID Karie Soda, MD   15 mL at 01/18/20 2211  . meningococcal oligosaccharide (MENVEO) injection 0.5 mL  0.5 mL Intramuscular Once Karie Soda, MD      . menthol-cetylpyridinium (CEPACOL) lozenge 3 mg  1 lozenge Oral PRN Karie Soda, MD   3 mg at 01/10/20 2240  . methocarbamol (ROBAXIN) tablet 750 mg  750 mg Oral TID Adam Phenix, PA-C      . metoprolol tartrate (LOPRESSOR) injection 5 mg  5 mg Intravenous Q6H PRN Karie Soda, MD   5 mg at 01/16/20 1342  . ondansetron (ZOFRAN) injection 4 mg  4 mg Intravenous Q6H PRN Karie Soda, MD       Or  . ondansetron (ZOFRAN) 8 mg in sodium chloride 0.9 % 50 mL IVPB  8 mg Intravenous Q6H PRN Karie Soda, MD      . pantoprazole (PROTONIX) EC tablet 40 mg  40 mg Oral BID Abigail Miyamoto, MD   40 mg at 01/18/20 2214  . phenol (CHLORASEPTIC) mouth spray 2 spray  2 spray Mouth/Throat PRN Karie Soda, MD      . pneumococcal 13-valent conjugate vaccine (PREVNAR 13) injection 0.5 mL  0.5 mL Intramuscular Prior to discharge Karie Soda, MD      . QUEtiapine (SEROQUEL) tablet 25 mg  25 mg Oral QHS Kathlen Mody, MD      . simethicone (MYLICON) chewable tablet 40 mg  40 mg Oral Q6H PRN Karie Soda, MD      . sodium chloride flush (NS) 0.9 % injection 10-40 mL  10-40 mL Intracatheter Q12H Abigail Miyamoto, MD   10 mL at 01/18/20 1053  . sodium chloride flush (NS) 0.9 % injection 10-40 mL  10-40 mL Intracatheter PRN Abigail Miyamoto, MD      . thiamine tablet 100 mg  100 mg Oral Daily Karie Soda, MD   100 mg at 01/18/20 1053   Or  . thiamine (B-1) injection 100 mg  100 mg Intravenous Daily Karie Soda, MD   100 mg at 01/16/20 0934  . traMADol (ULTRAM) tablet 50 mg  50 mg Oral Q6H PRN Adam Phenix, PA-C         Discharge  Medications: Please see discharge summary for a list of discharge medications.  Relevant Imaging Results:  Relevant Lab Results:   Additional Information ssn:[id number]  Clearance Coots, LCSW

## 2020-01-19 NOTE — Progress Notes (Signed)
Assessment & Plan: POD#9 Perforated gastric ulcer S/p laparoscopic graham patch, laparoscopic splenectomy - 01/10/20 Dr. [name] -  Tolerating SOFT diet, TPN has been stopped -  Mental status improving- continue restraints as needed per nursing. D/C foley and PICC line to help facilitate restraint removal. May still need restraints PRN for agitation/impulsivity. - switch to po meds -  PT/OT now recommending SNF, Pt states she wants to go home. Will discuss further with pt and family. CM consulted.  Below per TRH: Tobacco abuse Alcohol abuse Hx of trigeminal neuralgia  Chronic Hepatitis C Severe protein calorie malnutrition         [name], [facility]       Office: [phone]   Chief Complaint: Perforated ulcer  Subjective: Patient more alert, communicative. Eating breakfast. Oriented to person and hospital. Able to tell me she was admitted here for surgery. Unsure of the year. States prior to admission she was living in an apartment with a friend.   Objective: Vital signs in last 24 hours: Temp:  [98.7 F (37.1 C)-98.9 F (37.2 C)] 98.7 F (37.1 C) (01/17 0538) Pulse Rate:  [58-67] 58 (01/17 0538) Resp:  [16-18] 16 (01/17 0538) BP: (128-147)/(69-95) 138/69 (01/17 0538) SpO2:  [99 %] 99 % (01/17 0538) Last BM Date: 01/18/20  Intake/Output from previous day: 01/16 0701 - 01/17 0700 In: 2025 [P.O.:1610; I.V.:215; IV Piggyback:200] Out: 1450 [Urine:1450] Intake/Output this shift: No intake/output data recorded.  Physical Exam: HEENT - sclerae clear, mucous membranes moist Neck - soft Abdomen - soft without distension; incisions dry and intact Ext - no edema, non-tender  Lab Results:  Recent Labs    01/16/20 1649 01/17/20 0540  WBC  --  12.6*  HGB 8.7* 9.6*  HCT 26.6* 28.5*  PLT  --  531*   BMET Recent Labs    01/17/20 0540 01/18/20 1210  NA 139 136  K 3.3* 3.7  CL 104 101  CO2 28 26  GLUCOSE 134* 82   BUN 8 5*  CREATININE 0.68 0.65  CALCIUM 8.7* 9.1   PT/INR No results for input(s): LABPROT, INR in the last 72 hours. Comprehensive Metabolic Panel:    Component Value Date/Time   NA 136 01/18/2020 1210   NA 139 01/17/2020 0540   K 3.7 01/18/2020 1210   K 3.3 (L) 01/17/2020 0540   CL 101 01/18/2020 1210   CL 104 01/17/2020 0540   CO2 26 01/18/2020 1210   CO2 28 01/17/2020 0540   BUN 5 (L) 01/18/2020 1210   BUN 8 01/17/2020 0540   CREATININE 0.65 01/18/2020 1210   CREATININE 0.68 01/17/2020 0540   CREATININE 0.86 09/21/2014 1214   CREATININE 0.80 04/15/2014 1238   GLUCOSE 82 01/18/2020 1210   GLUCOSE 134 (H) 01/17/2020 0540   CALCIUM 9.1 01/18/2020 1210   CALCIUM 8.7 (L) 01/17/2020 0540   AST 32 01/15/2020 0853   AST 29 01/10/2020 0825   ALT 17 01/15/2020 0853   ALT 8 01/10/2020 0825   ALKPHOS 58 01/15/2020 0853   ALKPHOS 74 01/10/2020 0825   BILITOT 0.7 01/15/2020 0853   BILITOT 1.0 01/10/2020 0825   PROT 5.2 (L) 01/15/2020 0853   PROT 6.9 01/10/2020 0825   ALBUMIN 2.5 (L) 01/15/2020 0853   ALBUMIN 3.9 01/10/2020 0825    Studies/Results: No results found.    Tracy Barton 01/19/2020  Patient ID: Tracy Barton, female   DOB: November 20, 1945, 75  y.o.   MRN: [id number]

## 2020-01-19 NOTE — Progress Notes (Incomplete)
Located pt's

## 2020-01-19 NOTE — Progress Notes (Signed)
PROGRESS NOTE    Tracy Barton  FTD:[id number] DOB: 11/03/1945 DOA: 01/10/2020 PCP: Patient, No Pcp Per    Chief Complaint  Patient presents with  . Abdominal Pain    Brief Narrative:  Tracy Barton is a 75 y.o. female with a history of hepatitic C infection, alcohol abuse. Patient presented secondary to abdominal pain and found to have a perforated gastric ulcer requiring emergent surgery. Hospital course complicated by alcohol withdrawal symptoms. Pt is currently on prn haldol.  Patient seen and examined at bedside.  She appears calm and is answering all questions appropriately she is appears to be in restraints.   Assessment & Plan:   Principal Problem:   Perforated prepyloric gastric ulcer s/p omental Cheree Ditto patch 01/10/2020 Active Problems:   Smoker   Gastritis and gastroduodenitis   Trigeminal neuralgia   Hep C w/o coma, chronic (HCC)   History of medication noncompliance   ARF (acute renal failure) (HCC)   S/P laparoscopic splenectomy   Protein-calorie malnutrition, severe   Perforated gastric ulcer; S/p splenectomy,  Washout/lysis of adhesions and omental patch.  Further management as per surgery.  She was started on clears and advance to soft today.   Acute encephalopathy secondary to alcohol withdrawal in the setting of ? Dementia. At home pt is on atarax for anxiety?  Ativan was discontinued as she became sedated from IV ativan.  Pt is more alert and answering questions appropriately.  Continue as needed Haldol and added Seroquel at night.  DC restraints at this time. Vitamin B12 levels, vitamin B levels, ammonia, ordered TSH within normal limits.  AKI:  - resolved    Hypokalemia:  Replaced   Hypophosphatemia:  Replaced.    Acute Anemia of blood loss  From surgery superimposed on anemia of chronic disease:  Hemoglobin of 6.8 on 01/16/20 1 unit of prbc transfusion ordered by general surgery. Repeat hemoglobin improved to 9.6 Transfuse to keep  hemoglobin greater than 8.   Nutrition:  Advance diet as tolerated.    Metabolic acidosis:  -elevated anion gap.  Resolved.   Acute metabolic encephalopathy:  - probably from alcohol withdrawal and ativan and dilaudid.  - ativan d/ced. Try to minimize the use of dilaudid.  - she is alert and oriented to person and place.     Hypertensive urgency.  Resolved resume norvasc 5 mg daily.     Mild leukocytosis.  Improving.      DVT prophylaxis: Lovenox Code Status: (Full code) Family Communication: none at bedside Disposition:  Per primary service.    Antimicrobials: NOne.    Subjective:  No new complaints she is alert and oriented to place and person. Objective: Vitals:   01/18/20 0549 01/18/20 1402 01/18/20 2142 01/19/20 0538  BP: (!) 150/76 (!) 147/95 128/72 138/69  Pulse: (!) 58 63 67 (!) 58  Resp: 16 18 18 16   Temp: 99.1 F (37.3 C) 98.7 F (37.1 C) 98.9 F (37.2 C) 98.7 F (37.1 C)  TempSrc: Oral Oral Oral Oral  SpO2: 99% 99% 99% 99%  Weight:      Height:        Intake/Output Summary (Last 24 hours) at 01/19/2020 1325 Last data filed at 01/19/2020 0900 Gross per 24 hour  Intake 1205.04 ml  Output 1250 ml  Net -44.96 ml   Filed Weights   01/10/20 1401 01/10/20 1901 01/15/20 1400  Weight: 49 kg 38.7 kg 45.8 kg    Examination:  General exam: Alert, comfortable not in any kind of  distress Respiratory system: Air entry fair bilateral, no wheezing or rhonchi Cardiovascular system: S1-S2 heard, regular rate rhythm,, no JVD Gastrointestinal system: Abdomen is soft, nontender, nondistended, bowel sounds normal Central nervous system: Alert and oriented to place and person Extremities: No cyanosis or clubbing Skin: No rashes seen Psychiatry: Mood is appropriate  Data Reviewed: I have personally reviewed following labs and imaging studies  CBC: Recent Labs  Lab 01/14/20 0825 01/14/20 1435 01/15/20 0853 01/16/20 0733 01/16/20 1649 01/17/20 0540   WBC 8.0 9.0 8.9 8.9  --  12.6*  HGB 7.0* 7.2* 8.6* 6.8* 8.7* 9.6*  HCT 21.2* 21.9* 26.5* 20.6* 26.6* 28.5*  MCV 91.4 90.9 92.3 90.7  --  88.5  PLT 318 330 425* 443*  --  531*    Basic Metabolic Panel: Recent Labs  Lab 01/14/20 0825 01/15/20 0826 01/15/20 0853 01/16/20 0733 01/17/20 0540 01/18/20 1210  NA 141 141 142 138 139 136  K 2.8* 3.0* 3.1* 3.7 3.3* 3.7  CL 103 100 100 103 104 101  CO2 30 30 30 26 28 26   GLUCOSE 92 346* 74 108* 134* 82  BUN 6* 6* 6* 9 8 5*  CREATININE 0.60 0.67 0.71 0.59 0.68 0.65  CALCIUM 8.2* 8.0* 8.6* 8.4* 8.7* 9.1  MG 1.3*  --  1.7 1.5* 1.8 1.7  PHOS  --   --  1.3* 2.2* 2.9 3.3    GFR: Estimated Creatinine Clearance: 43.9 mL/min (by C-G formula based on SCr of 0.65 mg/dL).  Liver Function Tests: Recent Labs  Lab 01/15/20 0853  AST 32  ALT 17  ALKPHOS 58  BILITOT 0.7  PROT 5.2*  ALBUMIN 2.5*    CBG: Recent Labs  Lab 01/16/20 2354 01/17/20 0346 01/17/20 0611 01/17/20 0829 01/17/20 1258  GLUCAP 118* 142* 125* 100* 135*     Recent Results (from the past 240 hour(s))  Blood culture (routine x 2)     Status: None   Collection Time: 01/10/20  9:33 AM   Specimen: BLOOD  Result Value Ref Range Status   Specimen Description   Final    BLOOD RIGHT ANTECUBITAL Performed at [facility], [address] [place], [place]    Special Requests   Final    BOTTLES DRAWN AEROBIC AND ANAEROBIC Blood Culture results may not be optimal due to an inadequate volume of blood received in culture bottles Performed at [facility], [address] [place], [place]    Culture   Final    NO GROWTH 5 DAYS Performed at [facility], [address], [place], [place]    Report Status 01/15/2020 FINAL  Final  Blood culture (routine x 2)     Status: None   Collection Time: 01/10/20 10:09 AM   Specimen: BLOOD RIGHT FOREARM  Result Value Ref Range Status   Specimen Description    Final    BLOOD RIGHT FOREARM Performed at [facility], [address], [place], [place]    Special Requests   Final    BOTTLES DRAWN AEROBIC AND ANAEROBIC Blood Culture results may not be optimal due to an inadequate volume of blood received in culture bottles Performed at [facility], [address], [place], [place]    Culture   Final    NO GROWTH 5 DAYS Performed at [facility], [address] [place], [place]    Report Status 01/15/2020 FINAL  Final  Resp Panel by RT-PCR (Flu A&B, Covid) Nasopharyngeal Swab     Status: None   Collection Time: 01/10/20 11:09 AM   Specimen: Nasopharyngeal Swab; Nasopharyngeal(NP) swabs in vial transport medium  Result Value Ref Range Status   SARS Coronavirus 2 by RT PCR NEGATIVE NEGATIVE Final    Comment: (NOTE) SARS-CoV-2 target nucleic acids are NOT DETECTED.  The SARS-CoV-2 RNA is generally detectable in upper respiratory specimens during the acute phase of infection. The lowest concentration of SARS-CoV-2 viral copies this assay can detect is 138 copies/mL. A negative result does not preclude SARS-Cov-2 infection and should not be used as the sole basis for treatment or other patient management decisions. A negative result may occur with  improper specimen collection/handling, submission of specimen other than nasopharyngeal swab, presence of viral mutation(s) within the areas targeted by this assay, and inadequate number of viral copies(<138 copies/mL). A negative result must be combined with clinical observations, patient history, and epidemiological information. The expected result is Negative.  Fact Sheet for Patients:  BloggerCourse.com  Fact Sheet for Healthcare Providers:  SeriousBroker.it  This test is no t yet approved or cleared by the [place] FDA and  has been authorized for detection and/or diagnosis of  SARS-CoV-2 by FDA under an Emergency Use Authorization (EUA). This EUA will remain  in effect (meaning this test can be used) for the duration of the COVID-19 declaration under Section 564(b)(1) of the Act, 21 U.S.C.section 360bbb-3(b)(1), unless the authorization is terminated  or revoked sooner.       Influenza A by PCR NEGATIVE NEGATIVE Final   Influenza B by PCR NEGATIVE NEGATIVE Final    Comment: (NOTE) The Xpert Xpress SARS-CoV-2/FLU/RSV plus assay is intended as an aid in the diagnosis of influenza from Nasopharyngeal swab specimens and should not be used as a sole basis for treatment. Nasal washings and aspirates are unacceptable for Xpert Xpress SARS-CoV-2/FLU/RSV testing.  Fact Sheet for Patients: BloggerCourse.com  Fact Sheet for Healthcare Providers: SeriousBroker.it  This test is not yet approved or cleared by the [place] FDA and has been authorized for detection and/or diagnosis of SARS-CoV-2 by FDA under an Emergency Use Authorization (EUA). This EUA will remain in effect (meaning this test can be used) for the duration of the COVID-19 declaration under Section 564(b)(1) of the Act, 21 U.S.C. section 360bbb-3(b)(1), unless the authorization is terminated or revoked.  Performed at [facility], [address] [place], [place] [address]   MRSA PCR Screening     Status: None   Collection Time: 01/10/20 10:30 PM   Specimen: Nasal Mucosa; Nasopharyngeal  Result Value Ref Range Status   MRSA by PCR NEGATIVE NEGATIVE Final    Comment:        The GeneXpert MRSA Assay (FDA approved for NASAL specimens only), is one component of a comprehensive MRSA colonization surveillance program. It is not intended to diagnose MRSA infection nor to guide or monitor treatment for MRSA infections. Performed at [facility], [address], [place], [place] [address]           Radiology Studies: No results found.      Scheduled Meds: . acetaminophen  650 mg Oral Q6H  . amLODipine  5 mg Oral Daily  . Chlorhexidine Gluconate Cloth  6 each Topical Daily  . dextrose  25 g Intravenous Once  . enoxaparin (LOVENOX) injection  30 mg Subcutaneous Q24H  . influenza vaccine adjuvanted  0.5 mL Intramuscular Tomorrow-1000  . lidocaine  1 patch Transdermal Q24H  . lip balm  1 application Topical BID  . mouth rinse  15 mL Mouth Rinse BID  . meningococcal oligosaccharide  0.5 mL Intramuscular Once  . methocarbamol  750 mg Oral TID  . pantoprazole  40 mg Oral BID  . QUEtiapine  25 mg Oral QHS  . sodium chloride flush  10-40 mL Intracatheter Q12H  . thiamine  100 mg Oral Daily   Or  . thiamine  100 mg Intravenous Daily   Continuous Infusions: . sodium chloride Stopped (01/13/20 0337)  . ondansetron (ZOFRAN) IV       LOS: 9 days        Kathlen ModyVijaya Yousof Alderman, MD [facility]   To contact the attending provider between 7A-7P or the covering provider during after hours 7P-7A, please log into the web site [email or web address] and access using universal [place] password for that web site. If you do not have the password, please call the hospital operator.  01/19/2020, 1:25 PM

## 2020-01-20 ENCOUNTER — Inpatient Hospital Stay (HOSPITAL_COMMUNITY): Payer: Medicare Other

## 2020-01-20 LAB — CBC
HCT: 32.8 % — ABNORMAL LOW (ref 36.0–46.0)
Hemoglobin: 10.3 g/dL — ABNORMAL LOW (ref 12.0–15.0)
MCH: 30.2 pg (ref 26.0–34.0)
MCHC: 31.4 g/dL (ref 30.0–36.0)
MCV: 96.2 fL (ref 80.0–100.0)
Platelets: 772 10*3/uL — ABNORMAL HIGH (ref 150–400)
RBC: 3.41 MIL/uL — ABNORMAL LOW (ref 3.87–5.11)
RDW: 14 % (ref 11.5–15.5)
WBC: 10 10*3/uL (ref 4.0–10.5)
nRBC: 0 % (ref 0.0–0.2)

## 2020-01-20 LAB — GLUCOSE, CAPILLARY: Glucose-Capillary: 86 mg/dL (ref 70–99)

## 2020-01-20 IMAGING — MR MR HEAD W/O CM
11 of 12 series · 39 of 48 positions shown · non-contrast
Comparison: Head CT [DATE]. Report from brain MRI [DATE]
(images unavailable).

CLINICAL DATA: Mental status change, unknown cause.

EXAM:
MRI HEAD WITHOUT CONTRAST
TECHNIQUE: Multiplanar, multiecho pulse sequences of the brain and surrounding
structures were obtained without intravenous contrast.

[Series 5: DWI · axial · 3.0mm · 1.36mm/px · z∈[-33,+106]mm · 6 of 96 slices shown (1 of 4)]
[im 1/96]
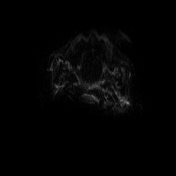
[im 20/96]
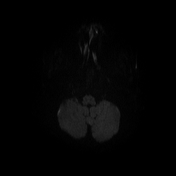
[im 39/96]
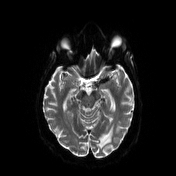
[im 58/96]
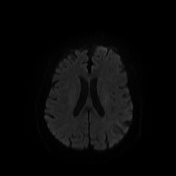
[im 77/96]
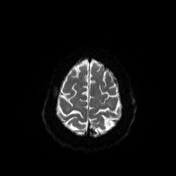
[im 96/96]
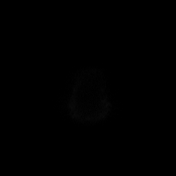

[Series 6: DWI · axial · 3.0mm · 1.36mm/px · z∈[-33,+106]mm · 3 of 48 slices shown (2 of 4)]
[im 1/48]
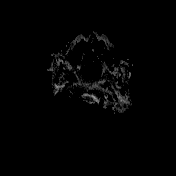
[im 24/48]
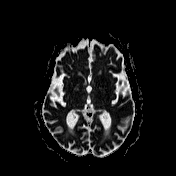
[im 48/48]
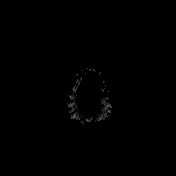

[Series 7: T1 · sagittal · 5.0mm · 0.75mm/px · 2 of 24 slices shown (1 of 2)]
[im 1/24]
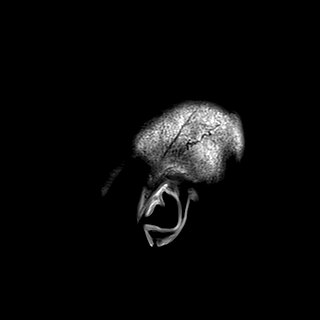
[im 24/24]
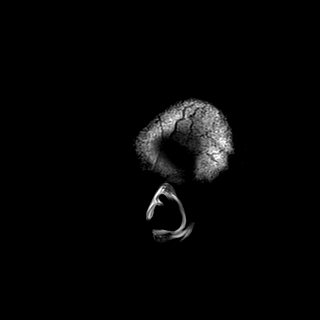

[Series 8: T2 · axial · 5.0mm · 0.60mm/px · 1 of 22 slices shown (1 of 2)]
[im 1/22]
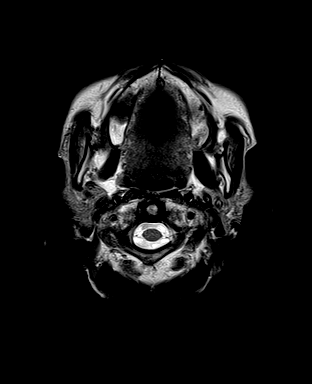

[Series 9: mip_images(sw) · axial · 24.0mm · 0.75mm/px · z∈[-27,+92]mm · 3 of 41 slices shown]
[im 1/41]
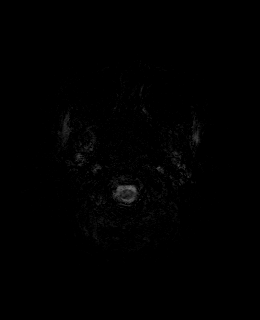
[im 21/41]
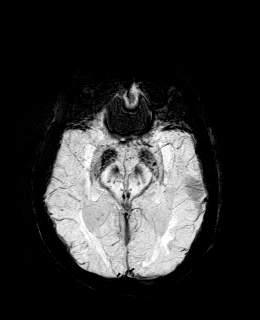
[im 41/41]
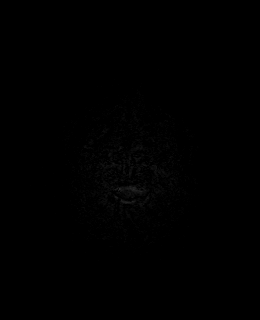

[Series 10: swi_images · axial · 3.0mm · 0.75mm/px · z∈[-37,+102]mm · 3 of 48 slices shown]
[im 1/48]
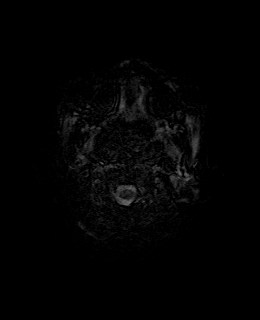
[im 24/48]
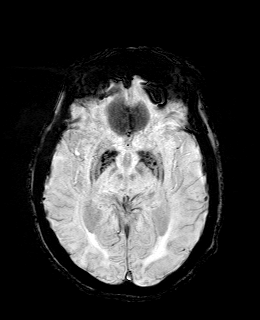
[im 48/48]
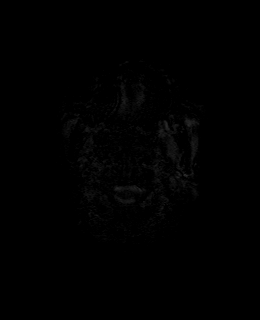

[Series 11: FLAIR · axial · 3.0mm · 0.75mm/px · z∈[-37,+102]mm · 3 of 48 slices shown]
[im 1/48]
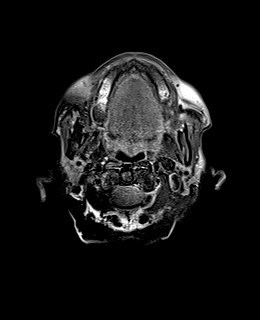
[im 24/48]
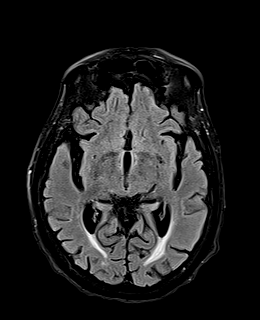
[im 48/48]
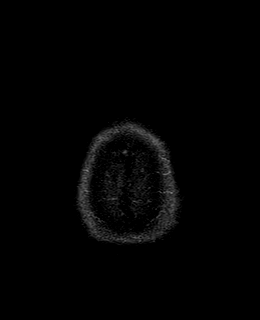

[Series 12: T1 · axial · 1.0mm · 0.94mm/px · z∈[-39,+102]mm · 10 of 144 slices shown (2 of 2)]
[im 1/144]
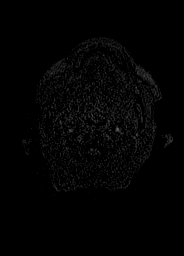
[im 16/144]
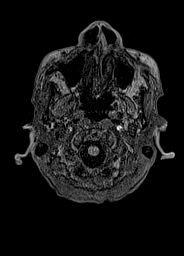
[im 32/144]
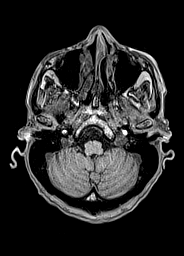
[im 48/144]
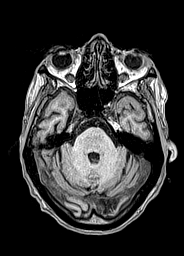
[im 64/144]
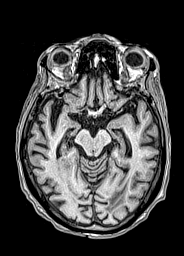
[im 80/144]
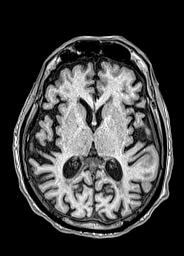
[im 96/144]
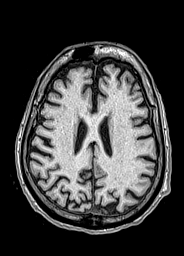
[im 112/144]
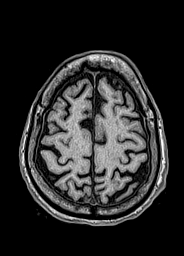
[im 128/144]
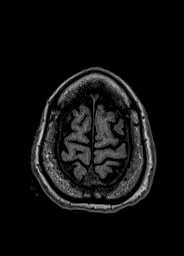
[im 144/144]
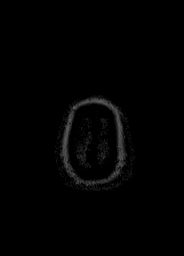

[Series 13: DWI · coronal · 5.0mm · 1.31mm/px · 4 of 64 slices shown (3 of 4)]
[im 1/64]
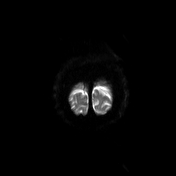
[im 22/64]
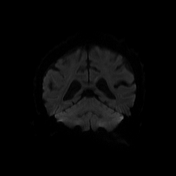
[im 43/64]
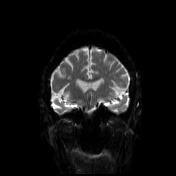
[im 64/64]
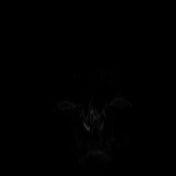

[Series 14: DWI · coronal · 5.0mm · 1.31mm/px · 2 of 32 slices shown (4 of 4)]
[im 1/32]
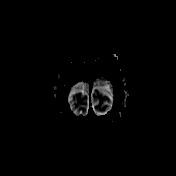
[im 32/32]
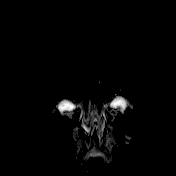

[Series 15: T2 · coronal · 5.0mm · 0.57mm/px · 2 of 30 slices shown (2 of 2)]
[im 1/30]
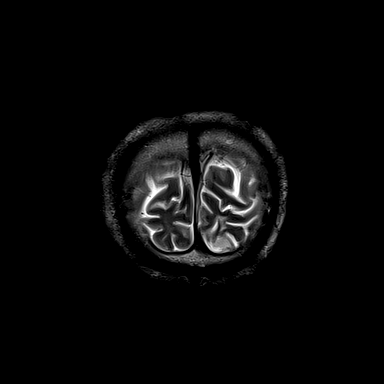
[im 30/30]
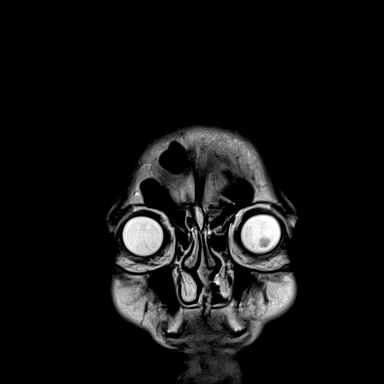

[39 of 48 positions shown; findings below may reference images not displayed]

FINDINGS: Brain:

Mild cerebral and cerebellar atrophy.

Chronic cortical/subcortical left PCA territory infarct within the
left occipital lobe.

Chronic lacunar infarct within the right lentiform nucleus.
Background mild to moderate multifocal T2/FLAIR hyperintensity
within the cerebral white matter and pons, nonspecific but
compatible with chronic small vessel ischemic disease.

There are a few small scattered supratentorial foci of SWI signal
loss, likely reflecting nonspecific chronic microhemorrhages.

There is no acute infarct.

No evidence of intracranial mass.

No extra-axial fluid collection.

No midline shift.

Vascular: Expected proximal arterial flow voids.

Skull and upper cervical spine: No focal suspicious marrow lesion.

Sinuses/Orbits: Visualized orbits show no acute finding. Trace
ethmoid sinus mucosal thickening.
IMPRESSION: No evidence of acute intracranial abnormality. Specifically, there
is no evidence of acute infarction.

Redemonstrated chronic cortical/subcortical left PCA territory
infarct.

Chronic right basal ganglia lacunar infarct.

Background mild generalized atrophy of the brain and
mild-to-moderate chronic small vessel ischemic disease.

## 2020-01-20 MED ORDER — CLARITHROMYCIN 250 MG PO TABS
500.0000 mg | ORAL_TABLET | Freq: Two times a day (BID) | ORAL | Status: DC
  Administered 2020-01-20 – 2020-01-23 (×7): 500 mg via ORAL
  Filled 2020-01-20 (×9): qty 2

## 2020-01-20 MED ORDER — ENSURE ENLIVE PO LIQD
237.0000 mL | Freq: Two times a day (BID) | ORAL | Status: DC
  Administered 2020-01-21 – 2020-01-22 (×3): 237 mL via ORAL

## 2020-01-20 MED ORDER — AMOXICILLIN 500 MG PO CAPS
1000.0000 mg | ORAL_CAPSULE | Freq: Two times a day (BID) | ORAL | Status: DC
  Administered 2020-01-20 – 2020-01-23 (×7): 1000 mg via ORAL
  Filled 2020-01-20 (×9): qty 2

## 2020-01-20 MED ORDER — ADULT MULTIVITAMIN W/MINERALS CH
1.0000 | ORAL_TABLET | Freq: Every day | ORAL | Status: DC
  Administered 2020-01-21 – 2020-01-23 (×3): 1 via ORAL
  Filled 2020-01-20 (×3): qty 1

## 2020-01-20 MED ORDER — CLARITHROMYCIN 250 MG PO TABS: 500.0000 mg | ORAL_TABLET | Freq: Two times a day (BID) | ORAL | Status: DC

## 2020-01-20 MED ORDER — AMOXICILLIN 500 MG PO TABS: 1000.0000 mg | ORAL_TABLET | Freq: Two times a day (BID) | ORAL | Status: DC

## 2020-01-20 NOTE — Progress Notes (Signed)
Occupational Therapy Treatment Patient Details Name: Tracy Barton MRN: [id number] DOB: 04-17-1945 Today's Date: 01/20/2020    History of present illness Patient is a 75 year old female PMH of hepatitic C infection, alcohol abuse. Patient presented secondary to abdominal pain and found to have a perforated gastric ulcer s/p omental Tracy Barton patch 01/10/2020   OT comments  Patient much improved this session, pleasant and cooperative requiring min cues for safety due to mild impulsivity and unsteadiness. Patient min G at sink to perform g/h, min A for toilet transfer and ambulation in hallway with rolling walker. Min cues to direct walker in order not to run into wall/objects. While pt mentation improved, still making statements like "I ate my breakfast at the hospital this morning" implying she is no longer in the hospital. Continue to recommend acute OT services to maximize patient safety and independence with self care in order facilitate D/C to venue listed below.    Follow Up Recommendations  SNF    Equipment Recommendations  None recommended by OT       Precautions / Restrictions Precautions Precautions: Fall Precaution Comments: RN okayed leaving off restraints       Mobility Bed Mobility Overal bed mobility: Needs Assistance Bed Mobility: Supine to Sit;Sit to Supine     Supine to sit: Supervision Sit to supine: Min assist   General bed mobility comments: patient able to sit up OOB without physical assistance, min A to complete lifting LEs back to bed  Transfers Overall transfer level: Needs assistance Equipment used: Rolling walker (2 wheeled) Transfers: Sit to/from Stand Sit to Stand: Min guard         General transfer comment: min G to power up to standing, min A for dynamic mobility    Balance Overall balance assessment: Needs assistance Sitting-balance support: Feet supported Sitting balance-Leahy Scale: Fair     Standing balance support: No upper  extremity supported Standing balance-Leahy Scale: Fair Standing balance comment: able to take a few steps without UE support however min G to min A for safety due to unsteady                           ADL either performed or assessed with clinical judgement   ADL Overall ADL's : Needs assistance/impaired Eating/Feeding: Set up;Bed level   Grooming: Wash/dry face;Min guard;Standing;Wash/dry hands               Lower Body Dressing: Minimal assistance;Sitting/lateral leans Lower Body Dressing Details (indicate cue type and reason): to don shoes, patient needed assist to loosen laces and pull out tongue patient states "I'm just lazy" Toilet Transfer: Ambulation;Minimal assistance Toilet Transfer Details (indicate cue type and reason): min A for safety as patient is unsteady and declined use of walker to ambulate into bathroom Toileting- Clothing Manipulation and Hygiene: Min guard;Sit to/from stand       Functional mobility during ADLs: Minimal assistance;Rolling walker General ADL Comments: patient states the "atmosphere" is much better now and is very pleasant/cooperative. min A for safety with rolling walker to ambulate ~227ft, required min cues to direct walker away from walls/obstacles in hall               Cognition Arousal/Alertness: Awake/alert Behavior During Therapy: [facility] for tasks assessed/performed Overall Cognitive Status: No family/caregiver present to determine baseline cognitive functioning  General Comments: patient is very pleasant and cooperative, following directions with min cues. does state "I had my breakfast this morning at the hospital"                   Pertinent Vitals/ Pain       Pain Assessment: Faces Faces Pain Scale: No hurt         Frequency  Min 2X/week        Progress Toward Goals  OT Goals(current goals can now be found in the care plan section)  Progress towards OT  goals: Progressing toward goals;Goals met and updated - see care plan  Acute Rehab OT Goals Patient Stated Goal: wash her face OT Goal Formulation: With patient Time For Goal Achievement: 01/30/20 Potential to Achieve Goals: Good ADL Goals Pt Will Perform Grooming: with supervision;standing Pt Will Transfer to Toilet: with supervision;ambulating;regular height toilet (walker) Additional ADL Goal #1: Patient will follow 1 step directions with less than 25% multimodal cues in order to participate in self care tasks  Plan Discharge plan remains appropriate       AM-PAC OT "6 Clicks" Daily Activity     Outcome Measure   Help from another person eating meals?: A Little Help from another person taking care of personal grooming?: A Little Help from another person toileting, which includes using toliet, bedpan, or urinal?: A Little Help from another person bathing (including washing, rinsing, drying)?: A Little Help from another person to put on and taking off regular upper body clothing?: A Little Help from another person to put on and taking off regular lower body clothing?: A Little 6 Click Score: 18    End of Session Equipment Utilized During Treatment: Rolling walker  OT Visit Diagnosis: Unsteadiness on feet (R26.81);Other abnormalities of gait and mobility (R26.89);Other symptoms and signs involving cognitive function   Activity Tolerance Patient tolerated treatment well   Patient Left in bed;with call bell/phone within reach;with bed alarm set;with nursing/sitter in room   Nurse Communication Mobility status        Time: 6389-3734 OT Time Calculation (min): 27 min  Charges: OT General Charges $OT Visit: 1 Visit OT Treatments $Self Care/Home Management : 23-37 mins  Tracy Barton OT OT pager: [phone]   Tracy Barton 01/20/2020, 2:02 PM

## 2020-01-20 NOTE — Progress Notes (Signed)
Nutrition Follow-up  DOCUMENTATION CODES:   Underweight,Severe malnutrition in context of chronic illness  INTERVENTION:   -Ensure Enlive po BID, each supplement provides 350 kcal and 20 grams of protein  -Multivitamin with minerals daily  NUTRITION DIAGNOSIS:   Severe Malnutrition related to chronic illness as evidenced by severe fat depletion,severe muscle depletion.  Ongoing.  GOAL:   Patient will meet greater than or equal to 90% of their needs  Progressing  MONITOR:   Diet advancement,Labs,Weight trends,Skin,Other (Comment) (plan concerning nutrition support)  ASSESSMENT:   75 year-old female with medical history of neuromuscular disorder, gastritis, and depression. She presented to the ED due to severe abdominal pain. CT abdomen indicated numerous pockets of pneumoperitoneum of uncertain etiology. General Surgery consulted.  1/8: s/p lap graham patch, lap splenectomy,  NGT placed  1/13: NGT removed, TPN initiated, CLD 1/15: TPN stopped 1/16: Soft diet  Patient now on soft diet. Consuming 50-100% of meals. Will order supplements given malnutrition. Currently alert/oriented x 1. Per surgery note, pt is stable for discharge. Per MD note, plan is for pt to have restraints removed and pt can discharge.  Admission weight: 85 lbs. Last recorded weight 1/13: 100 lbs.  I/Os: +7.2L since admit UOP: 800 ml   Medications: Thiamine  Labs reviewed: CBGs: 75  Diet Order:   Diet Order            DIET SOFT Room service appropriate? Yes; Fluid consistency: Thin  Diet effective now                 EDUCATION NEEDS:   Not appropriate for education at this time  Skin:  Skin Assessment: Skin Integrity Issues: Skin Integrity Issues:: Incisions,Other (Comment) Incisions: abdomen (1/8) Other: skin tear to L wrist  Last BM:  PTA/unknown  Height:   Ht Readings from Last 1 Encounters:  01/10/20 5\' 4"  (1.626 m)    Weight:   Wt Readings from Last 1 Encounters:   01/15/20 45.8 kg    BMI:  Body mass index is 17.33 kg/m.  Estimated Nutritional Needs:   Kcal:  1450-1650 kcal  Protein:  75-90 grams  Fluid:  >/= 1.7 L/day  01/17/20, MS, RD, LDN Inpatient Clinical Dietitian Contact information available via Amion

## 2020-01-20 NOTE — Care Management Important Message (Signed)
Important Message  Patient Details IM Letter placed in Patients room.  Name: Tracy Barton MRN: [id number] Date of Birth: 12-01-1945   Medicare Important Message Given:  Yes     [name] 01/20/2020, 4:19 PM

## 2020-01-20 NOTE — Progress Notes (Signed)
Assessment & Plan: POD#10 Perforated gastric ulcer S/p laparoscopic graham patch, laparoscopic splenectomy - 01/10/20 Dr. [name] -  Tolerating SOFT diet, TPN has been stopped - from a surgical perspective this patient is stable for discharge  -  Mental status waxing and waning- continue restraints as needed per nursing.  -  PT/OT now recommending SNF, per CSW patients son is agreeable to SNF but patient has no bed offers because she is not vaccinated against covid, she will also need to be out of restraints to get to SNF.  Below per TRH: Acute encephalopathy - suspect due to alcohol withdrawal, possible also has an element of dementia. See above, remains confused, impulsive, requiring restraints. MRI today per medical team. Patient getting PRN IM haldol and was started on PO seroquel nightly yesterday. Tobacco abuse Alcohol abuse Hx of trigeminal neuralgia  Chronic Hepatitis C Severe protein calorie malnutrition         [name], [facility]       Office: [phone]   Chief Complaint: Perforated ulcer  Subjective: Patient more alert, communicative. Eating breakfast. Oriented to person and hospital. Able to tell me she was admitted here for surgery. Unsure of the year. States prior to admission she was living in an apartment with a friend.   Objective: Vital signs in last 24 hours: Temp:  [98.4 F (36.9 C)-98.9 F (37.2 C)] 98.4 F (36.9 C) (01/18 0530) Pulse Rate:  [62] 62 (01/17 1433) Resp:  [18] 18 (01/18 0530) BP: (120-130)/(78-90) 130/80 (01/18 0530) SpO2:  [98 %-99 %] 98 % (01/18 0530) Last BM Date: 01/18/20  Intake/Output from previous day: 01/17 0701 - 01/18 0700 In: 1250 [P.O.:1250] Out: 1450 [Urine:1450] Intake/Output this shift: No intake/output data recorded.  Physical Exam: HEENT - sclerae clear, mucous membranes moist Neck - soft Abdomen - soft without distension; incisions dry and intact Ext - no edema,  non-tender  Lab Results:  Recent Labs    01/20/20 0928  WBC 10.0  HGB 10.3*  HCT 32.8*  PLT 772*   BMET Recent Labs    01/18/20 1210  NA 136  K 3.7  CL 101  CO2 26  GLUCOSE 82  BUN 5*  CREATININE 0.65  CALCIUM 9.1   PT/INR No results for input(s): LABPROT, INR in the last 72 hours. Comprehensive Metabolic Panel:    Component Value Date/Time   NA 136 01/18/2020 1210   NA 139 01/17/2020 0540   K 3.7 01/18/2020 1210   K 3.3 (L) 01/17/2020 0540   CL 101 01/18/2020 1210   CL 104 01/17/2020 0540   CO2 26 01/18/2020 1210   CO2 28 01/17/2020 0540   BUN 5 (L) 01/18/2020 1210   BUN 8 01/17/2020 0540   CREATININE 0.65 01/18/2020 1210   CREATININE 0.68 01/17/2020 0540   CREATININE 0.86 09/21/2014 1214   CREATININE 0.80 04/15/2014 1238   GLUCOSE 82 01/18/2020 1210   GLUCOSE 134 (H) 01/17/2020 0540   CALCIUM 9.1 01/18/2020 1210   CALCIUM 8.7 (L) 01/17/2020 0540   AST 32 01/15/2020 0853   AST 29 01/10/2020 0825   ALT 17 01/15/2020 0853   ALT 8 01/10/2020 0825   ALKPHOS 58 01/15/2020 0853   ALKPHOS 74 01/10/2020 0825   BILITOT 0.7 01/15/2020 0853   BILITOT 1.0 01/10/2020 0825   PROT 5.2 (L) 01/15/2020 0853   PROT 6.9 01/10/2020 0825   ALBUMIN 2.5 (L) 01/15/2020 0853   ALBUMIN  3.9 01/10/2020 0825    Studies/Results: No results found.    Tracy Barton Tracy Barton 01/20/2020  Patient ID: Tracy Barton, female   DOB: October 22, 1945, 75 y.o.   MRN: [id number]

## 2020-01-20 NOTE — TOC Progression Note (Signed)
Transition of Care [facility]) - Progression Note    Patient Details  Name: Tracy Barton MRN: [id number] Date of Birth: [birth year]  Transition of Care [facility]) CM/SW [place], [place] Phone Number: 01/20/2020, 4:31 PM  Clinical Narrative:    CSW provided update to the patient daughter Mliss Sax. No SNF bed available.  SNF search extended further.  Daughter wants the patient to receive the first vaccine dose.  TOC staff will continue to follow this patient.  Expected Discharge Plan: [place] Barriers to Discharge: No SNF bed,Facility will not accept until restraint criteria met,Continued Medical Work Psychologist, sport and exercise)  Expected Discharge Plan and Services Expected Discharge Plan: [place]   Discharge Planning Services: CM Consult   Living arrangements for the past 2 months: Apartment                                       Social Determinants of Health (SDOH) Interventions    Readmission Risk Interventions No flowsheet data found.

## 2020-01-20 NOTE — Progress Notes (Signed)
PROGRESS NOTE    Tracy Barton  EHM:[id number] DOB: 01-04-1945 DOA: 01/10/2020 PCP: Patient, No Pcp Per    Chief Complaint  Patient presents with  . Abdominal Pain    Brief Narrative:  Tracy Barton is a 75 y.o. female with a history of hepatitic C infection, alcohol abuse. Patient presented secondary to abdominal pain and found to have a perforated gastric ulcer requiring emergent surgery. Hospital course complicated by alcohol withdrawal symptoms. Currently she does not have any withdrawal symptoms, but in wrist restraints. She is alert and oriented to place and person only.  MRI brain without contrast showed No evidence of acute intracranial abnormality. Specifically, there is no evidence of acute infarction. Redemonstrated chronic cortical/subcortical left PCA territory Infarct. Chronic right basal ganglia lacunar infarct. Background mild generalized atrophy of the brain and mild-to-moderate chronic small vessel ischemic disease..  It appears she had moderate dementia from the multiple infarcts .  Plan to take her off restraints and discharge her.    Assessment & Plan:   Principal Problem:   Perforated prepyloric gastric ulcer s/p omental [place] Ditto patch 01/10/2020 Active Problems:   Smoker   Gastritis and gastroduodenitis   Trigeminal neuralgia   Hep C w/o coma, chronic (HCC)   History of medication noncompliance   ARF (acute renal failure) (HCC)   S/P laparoscopic splenectomy   Protein-calorie malnutrition, severe   Perforated gastric ulcer; S/p splenectomy,  Washout/lysis of adhesions and omental patch.  Further management as per surgery.  She is able to tolerate soft diet without any issues.    Acute encephalopathy secondary to alcohol withdrawal in the setting of ? Dementia. Ativan was discontinued as she became sedated from IV ativan.  Pt is more alert and answering questions appropriately.  Continue as needed Haldol and added Seroquel at night.  DC restraints  at this time. Vitamin B12 levels wnl, vitamin B levels pending, ammonia level wnl, ordered in view of her h/o hepatitis c TSH within normal limits.  MRI brain without contrast  showed No evidence of acute intracranial abnormality. Specifically, there is no evidence of acute infarction. Redemonstrated chronic cortical/subcortical left PCA territory Infarct. Chronic right basal ganglia lacunar infarct. Background mild generalized atrophy of the brain and mild-to-moderate chronic small vessel ischemic disease..  It appears she had moderate dementia from the multiple infarcts .  AKI:  - resolved    Hypokalemia:  Replaced   Hypophosphatemia:  Replaced.    Acute Anemia of blood loss  From surgery superimposed on anemia of chronic disease:  Hemoglobin of 6.8 on 01/16/20 1 unit of prbc transfusion ordered by general surgery. Repeat hemoglobin improved to 10.3 Transfuse to keep hemoglobin greater than 8.   Nutrition:  Advance diet as tolerated.    Metabolic acidosis:  -elevated anion gap.  Resolved.     Hypertensive urgency.  Resolved  resume norvasc 5 mg daily.     Mild leukocytosis.  Resolved.    DVT prophylaxis: Lovenox Code Status: (Full code) Family Communication: none at bedside Disposition:  Per primary service.    Antimicrobials: NOne.    Subjective:  She is calm today, recommend to try to watch her off restraints.  She is alert , oriented to place and person.   Objective: Vitals:   01/19/20 1400 01/19/20 1433 01/19/20 2015 01/20/20 0530  BP:  120/90 122/78 130/80  Pulse:  62    Resp:  18 18 18   Temp:  98.9 F (37.2 C) 98.7 F (37.1 C) 98.4 F (36.9  C)  TempSrc:  Oral Oral Oral  SpO2: 99% 98% 98% 98%  Weight:      Height:        Intake/Output Summary (Last 24 hours) at 01/20/2020 1310 Last data filed at 01/20/2020 0600 Gross per 24 hour  Intake 1010 ml  Output 1450 ml  Net -440 ml   Filed Weights   01/10/20 1401 01/10/20 1901 01/15/20 1400   Weight: 49 kg 38.7 kg 45.8 kg    Examination:  General exam: Comfortable, not in any kind of distress, remains on restraints. Respiratory system: Air entry fair bilaterally, no wheezing rales rhonchi Cardiovascular system: S1-S2 heard, regular rate rhythm, no JVD Gastrointestinal system: Abdomen is soft, nontender, nondistended, bowel sounds normal Central nervous system: Alert and oriented to place and person, Extremities: No pedal edema Skin: No rashes seen Psychiatry: Mood is appropriate  Data Reviewed: I have personally reviewed following labs and imaging studies  CBC: Recent Labs  Lab 01/14/20 1435 01/15/20 0853 01/16/20 0733 01/16/20 1649 01/17/20 0540 01/20/20 0928  WBC 9.0 8.9 8.9  --  12.6* 10.0  HGB 7.2* 8.6* 6.8* 8.7* 9.6* 10.3*  HCT 21.9* 26.5* 20.6* 26.6* 28.5* 32.8*  MCV 90.9 92.3 90.7  --  88.5 96.2  PLT 330 425* 443*  --  531* 772*    Basic Metabolic Panel: Recent Labs  Lab 01/14/20 0825 01/15/20 0826 01/15/20 0853 01/16/20 0733 01/17/20 0540 01/18/20 1210  NA 141 141 142 138 139 136  K 2.8* 3.0* 3.1* 3.7 3.3* 3.7  CL 103 100 100 103 104 101  CO2 30 30 30 26 28 26   GLUCOSE 92 346* 74 108* 134* 82  BUN 6* 6* 6* 9 8 5*  CREATININE 0.60 0.67 0.71 0.59 0.68 0.65  CALCIUM 8.2* 8.0* 8.6* 8.4* 8.7* 9.1  MG 1.3*  --  1.7 1.5* 1.8 1.7  PHOS  --   --  1.3* 2.2* 2.9 3.3    GFR: Estimated Creatinine Clearance: 43.9 mL/min (by C-G formula based on SCr of 0.65 mg/dL).  Liver Function Tests: Recent Labs  Lab 01/15/20 0853  AST 32  ALT 17  ALKPHOS 58  BILITOT 0.7  PROT 5.2*  ALBUMIN 2.5*    CBG: Recent Labs  Lab 01/17/20 0346 01/17/20 0611 01/17/20 0829 01/17/20 1258 01/19/20 2149  GLUCAP 142* 125* 100* 135* 86     Recent Results (from the past 240 hour(s))  MRSA PCR Screening     Status: None   Collection Time: 01/10/20 10:30 PM   Specimen: Nasal Mucosa; Nasopharyngeal  Result Value Ref Range Status   MRSA by PCR NEGATIVE  NEGATIVE Final    Comment:        The GeneXpert MRSA Assay (FDA approved for NASAL specimens only), is one component of a comprehensive MRSA colonization surveillance program. It is not intended to diagnose MRSA infection nor to guide or monitor treatment for MRSA infections. Performed at [facility], [address] [place], [place]          Radiology Studies: MR BRAIN WO CONTRAST  Result Date: 01/20/2020 CLINICAL DATA:  Mental status change, unknown cause. EXAM: MRI HEAD WITHOUT CONTRAST TECHNIQUE: Multiplanar, multiecho pulse sequences of the brain and surrounding structures were obtained without intravenous contrast. COMPARISON:  Head CT 03/08/2019. Report from brain MRI 05/08/2007 (images unavailable). FINDINGS: Brain: Mild cerebral and cerebellar atrophy. Chronic cortical/subcortical left PCA territory infarct within the left occipital lobe. Chronic lacunar infarct within the right lentiform nucleus. Background mild  to moderate multifocal T2/FLAIR hyperintensity within the cerebral white matter and pons, nonspecific but compatible with chronic small vessel ischemic disease. There are a few small scattered supratentorial foci of SWI signal loss, likely reflecting nonspecific chronic microhemorrhages. There is no acute infarct. No evidence of intracranial mass. No extra-axial fluid collection. No midline shift. Vascular: Expected proximal arterial flow voids. Skull and upper cervical spine: No focal suspicious marrow lesion. Sinuses/Orbits: Visualized orbits show no acute finding. Trace ethmoid sinus mucosal thickening. IMPRESSION: No evidence of acute intracranial abnormality. Specifically, there is no evidence of acute infarction. Redemonstrated chronic cortical/subcortical left PCA territory infarct. Chronic right basal ganglia lacunar infarct. Background mild generalized atrophy of the brain and mild-to-moderate chronic small vessel ischemic disease.  Electronically Signed   By: Jackey Loge DO   On: 01/20/2020 11:15        Scheduled Meds: . acetaminophen  650 mg Oral Q6H  . amLODipine  5 mg Oral Daily  . Chlorhexidine Gluconate Cloth  6 each Topical Daily  . dextrose  25 g Intravenous Once  . enoxaparin (LOVENOX) injection  30 mg Subcutaneous Q24H  . influenza vaccine adjuvanted  0.5 mL Intramuscular Tomorrow-1000  . lidocaine  1 patch Transdermal Q24H  . lip balm  1 application Topical BID  . mouth rinse  15 mL Mouth Rinse BID  . meningococcal oligosaccharide  0.5 mL Intramuscular Once  . methocarbamol  750 mg Oral TID  . pantoprazole  40 mg Oral BID  . QUEtiapine  25 mg Oral QHS  . sodium chloride flush  10-40 mL Intracatheter Q12H  . thiamine  100 mg Oral Daily   Or  . thiamine  100 mg Intravenous Daily   Continuous Infusions: . sodium chloride Stopped (01/13/20 0337)  . ondansetron (ZOFRAN) IV       LOS: 10 days        Kathlen Mody, MD [facility]   To contact the attending provider between 7A-7P or the covering provider during after hours 7P-7A, please log into the web site [email or web address] and access using universal [place] password for that web site. If you do not have the password, please call the hospital operator.  01/20/2020, 1:10 PM

## 2020-01-20 NOTE — Progress Notes (Signed)
Noted patients H.pylori came back positive, will start 2 weeks of h pylori eradication treatment with amoxicillin, clarithromycin, and PPI BID.   [name], PA-C

## 2020-01-21 MED ORDER — THIAMINE HCL 100 MG/ML IJ SOLN
500.0000 mg | Freq: Every day | INTRAVENOUS | Status: DC
  Filled 2020-01-21 (×2): qty 5

## 2020-01-21 MED ORDER — FOLIC ACID 1 MG PO TABS
1.0000 mg | ORAL_TABLET | Freq: Every day | ORAL | Status: DC
  Administered 2020-01-21 – 2020-01-23 (×3): 1 mg via ORAL
  Filled 2020-01-21 (×3): qty 1

## 2020-01-21 NOTE — TOC Progression Note (Signed)
Transition of Care [facility]) - Progression Note    Patient Details  Name: Tracy Barton MRN: [id number] Date of Birth: 1945-08-07  Transition of Care [facility]) CM/SW [place], LCSW Phone Number: 01/21/2020, 4:10 PM  Clinical Narrative:    CSW notified the daughter Mliss Sax the patient does not have SNF bed offers. Patient is improving with PT. PT recommends SNF/HHPT Supervision (prn). Daughter plans to discuss [place] services w/the patient son Elenore Rota.   Expected Discharge Plan: [place] Barriers to Discharge: No SNF bed,Facility will not accept until restraint criteria met,Continued Medical Work Psychologist, sport and exercise)  Expected Discharge Plan and Services Expected Discharge Plan: [place]   Discharge Planning Services: CM Consult   Living arrangements for the past 2 months: Apartment                                       Social Determinants of Health (SDOH) Interventions    Readmission Risk Interventions No flowsheet data found.

## 2020-01-21 NOTE — Progress Notes (Signed)
Pt refusing vitals again this AM. Attempted by 3 different staff members. Pt verbally aggressive, swatting staff, and threatening. RN educated that pt is in the hospital and we needed vitals to make sure she's ok, pt responded " I'm gonna put you in the hospital in a minute, touch me again."  Pt is sleeping in chair currently. Sitter in room.

## 2020-01-21 NOTE — Progress Notes (Signed)
Physical Therapy Treatment Patient Details Name: Tracy Barton MRN: [id number] DOB: Mar 19, 1945 Today's Date: 01/21/2020    History of Present Illness Patient is a 75 year old female PMH of hepatitic C infection, alcohol abuse. Patient presented secondary to abdominal pain and found to have a perforated gastric ulcer s/p omental Cheree Ditto patch 01/10/2020    PT Comments    Pt progressing toward goals. pleasant and coopeartive. Hallway amb with HHA initially,  gait stability improved with distance. No overt LOB with dynamic tasks, head turns, turns, reaching outside BOS. Continue to follow in acute setting.   Follow Up Recommendations  SNF (HHPT and supervision prn if SNF bed unavailable)     Equipment Recommendations  Other (comment) (TBD)    Recommendations for Other Services       Precautions / Restrictions Precautions Precautions: Fall Restrictions Weight Bearing Restrictions: No    Mobility  Bed Mobility               General bed mobility comments: NT--pt in recliner  Transfers Overall transfer level: Needs assistance Equipment used: Rolling walker (2 wheeled);None Transfers: Sit to/from Stand Sit to Stand: Supervision         General transfer comment: for safety, pt stands from reclined chair position as PT returning to room (alarm not on)  Ambulation/Gait Ambulation/Gait assistance: Supervision;Min guard Gait Distance (Feet): 300 Feet (15' more) Assistive device: None;1 person hand held assist Gait Pattern/deviations: Step-through pattern;Decreased stride length     General Gait Details: supervision to min/guard for safety, HHA initially d/t slight unsteadiness, stability improved with distance   Stairs             Wheelchair Mobility    Modified Rankin (Stroke Patients Only)       Balance                                            Cognition Arousal/Alertness: Awake/alert Behavior During Therapy: WFL for tasks  assessed/performed Overall Cognitive Status: No family/caregiver present to determine baseline cognitive functioning                                 General Comments: pt is pleasant and cooperative with PT, states she is pleased to be up walking and talking with someone. pt stepped behind linen cart in hallway stating she "thought it was the batrhoom", easily redirected      Exercises      General Comments        Pertinent Vitals/Pain Pain Assessment: Faces Faces Pain Scale: Hurts a little bit Pain Location: abd Pain Descriptors / Indicators: Discomfort Pain Intervention(s): Limited activity within patient's tolerance;Monitored during session    Home Living                      Prior Function            PT Goals (current goals can now be found in the care plan section) Acute Rehab PT Goals Patient Stated Goal: get better PT Goal Formulation: Patient unable to participate in goal setting Time For Goal Achievement: 01/30/20 Potential to Achieve Goals: Good Progress towards PT goals: Progressing toward goals    Frequency    Min 2X/week      PT Plan Current plan remains appropriate    Co-evaluation  AM-PAC PT "6 Clicks" Mobility   Outcome Measure  Help needed turning from your back to your side while in a flat bed without using bedrails?: None Help needed moving from lying on your back to sitting on the side of a flat bed without using bedrails?: None Help needed moving to and from a bed to a chair (including a wheelchair)?: None Help needed standing up from a chair using your arms (e.g., wheelchair or bedside chair)?: None Help needed to walk in hospital room?: A Little Help needed climbing 3-5 steps with a railing? : A Little 6 Click Score: 22    End of Session   Activity Tolerance: Patient tolerated treatment well Patient left: in chair;with call bell/phone within reach;with chair alarm set   PT Visit Diagnosis:  Unsteadiness on feet (R26.81);Difficulty in walking, not elsewhere classified (R26.2)     Time: 5790-3833 PT Time Calculation (min) (ACUTE ONLY): 23 min  Charges:                        Delice Bison, PT  Acute Rehab Dept [facility]) [phone] Pager [phone]  01/21/2020    [facility] 01/21/2020, 3:00 PM

## 2020-01-21 NOTE — Progress Notes (Signed)
PROGRESS NOTE    Tracy Barton  ZOX:[id number] DOB: 1945-02-15 DOA: 01/10/2020 PCP: Patient, No Pcp Per   Chief Complain: Abdominal pain  Brief Narrative: Patient is a 75 year old female with history of hepatitis C, chronic alcohol abuse who presented to the emergency department complaints of abdominal pain.  She was found to have perforated gastric ulcer requiring emergent surgery.  Hospital course complicated by alcohol withdrawal symptoms, persistent altered mental status.  She is alert and oriented to place and person only.  MRI of the brain did not show any acute endocrine abnormality but showed chronic cortical/subcortical left PCA territory infract.  Dementia was suspected from multiple infarcts as seen on the brain imagings.  Currently she is waiting for skilled nursing facility placement.  Mental status looks slowly improving.  Assessment & Plan:   Principal Problem:   Perforated prepyloric gastric ulcer s/p omental Cheree DittoGraham patch 01/10/2020 Active Problems:   Smoker   Gastritis and gastroduodenitis   Trigeminal neuralgia   Hep C w/o coma, chronic (HCC)   History of medication noncompliance   ARF (acute renal failure) (HCC)   S/P laparoscopic splenectomy   Protein-calorie malnutrition, severe   Perforated gastric ulcer: Status post laparoscopic Graham's patch, laparoscopic splenectomy.  H. pylori came back to be positive.  She has been started on 2 weeks course of amoxicillin , clarithromycin and PPI twice daily.  Altered mental status: History of chronic alcohol abuse.  Brain imagings did not show any acute intracranial abnormality but showed previous infarcts.  She might have underlying dementia.   This could also be from Warnicke's encephalopathy from chronic alcohol abuse.  Normal vitamin B12, ammonia level, normal TSH.  Thiamine level pending.  We might start on high-dose thiamine supplementation, sometimes it helps. She required restraints during this hospitalization.  We  have discontinued restraints and sitter. continue Seroquel oral.  Chronic alcohol abuse: She has been a heavy drinker as per her daughter.  Continue thiamine and folic acid.  She has been started on high-dose thiamine for possible Warnicke's encephalopathy.  AKI: Resolved  Hypokalemia/hypophosphatemia: Being monitored and supplemented  Acute blood loss normocytic anemia: Likely associated  with surgery and also chronic medical conditions.  She was given a unit of PRBC on 01/16/2020.  Currently hemoglobin stable.  Severe protein calorie moderation: Nutrition following.  Chronic hepatitis C plan: We  recommend outpatient treatment  Hypertension: Continue Norvasc.  Monitor blood pressure    Nutrition Problem: Severe Malnutrition Etiology: chronic illness      DVT prophylaxis: Lovenox Code Status full code:  Family Communication: Daughter on phone on 01/21/20 Status is: Inpatient  Remains inpatient appropriate because:Unsafe d/c plan   Dispo: The patient is from: Home              Anticipated d/c is to: SNF              Anticipated d/c date is: As  Soon as bed is available              Patient currently is medically stable for dc    Consultants: Surgery  Procedures:As above  Antimicrobials:  Anti-infectives (From admission, onward)   Start     Dose/Rate Route Frequency Ordered Stop   01/20/20 1530  clarithromycin (BIAXIN) tablet 500 mg  Status:  Discontinued        500 mg Oral Every 12 hours 01/20/20 1433 01/20/20 1437   01/20/20 1530  amoxicillin (AMOXIL) tablet 1,000 mg  Status:  Discontinued  Note to Pharmacy: H pylori eradication   1,000 mg Oral Every 12 hours 01/20/20 1436 01/20/20 1436   01/20/20 1530  amoxicillin (AMOXIL) capsule 1,000 mg       Note to Pharmacy: H pylori eradication   1,000 mg Oral Every 12 hours 01/20/20 1436 02/03/20 0959   01/20/20 1530  clarithromycin (BIAXIN) tablet 500 mg        500 mg Oral Every 12 hours 01/20/20 1437 02/03/20 0959    01/12/20 0600  piperacillin-tazobactam (ZOSYN) IVPB 3.375 g  Status:  Discontinued        3.375 g 12.5 mL/hr over 240 Minutes Intravenous Every 8 hours 01/12/20 0358 01/16/20 0809   01/11/20 0000  piperacillin-tazobactam (ZOSYN) IVPB 2.25 g  Status:  Discontinued        2.25 g 100 mL/hr over 30 Minutes Intravenous Every 6 hours 01/10/20 2139 01/12/20 0358   01/10/20 2215  piperacillin-tazobactam (ZOSYN) IVPB 3.375 g  Status:  Discontinued        3.375 g 12.5 mL/hr over 240 Minutes Intravenous Every 8 hours 01/10/20 2122 01/10/20 2139   01/10/20 1200  cefoTEtan (CEFOTAN) 2 g in sodium chloride 0.9 % 100 mL IVPB        2 g 200 mL/hr over 30 Minutes Intravenous To [facility] 01/10/20 1133 01/11/20 0700   01/10/20 1115  piperacillin-tazobactam (ZOSYN) IVPB 3.375 g        3.375 g 100 mL/hr over 30 Minutes Intravenous  Once 01/10/20 1106 01/10/20 1200      Subjective:  Patient seen and examined at bedside this morning.  Hemodynamically stable.  Sleepingon the chair when I arrived.  Sitter was at the bedside.  She communicated with me but denied answering questions like where she is and what month is this.  She allowed me to examine her.  She looked overall comfortable  Objective: Vitals:   01/19/20 1433 01/19/20 2015 01/20/20 0530 01/20/20 1334  BP: 120/90 122/78 130/80 138/73  Pulse: 62   61  Resp: 18 18 18 18   Temp: 98.9 F (37.2 C) 98.7 F (37.1 C) 98.4 F (36.9 C) (!) 97.5 F (36.4 C)  TempSrc: Oral Oral Oral Oral  SpO2: 98% 98% 98% 100%  Weight:      Height:        Intake/Output Summary (Last 24 hours) at 01/21/2020 0748 Last data filed at 01/21/2020 0739 Gross per 24 hour  Intake 240 ml  Output -  Net 240 ml   Filed Weights   01/10/20 1401 01/10/20 1901 01/15/20 1400  Weight: 49 kg 38.7 kg 45.8 kg    Examination:  General exam: comfortable, thin built, malnourished Respiratory system: Bilateral equal air entry, normal vesicular breath sounds, no wheezes  or crackles  Cardiovascular system: S1 & S2 heard, RRR. No JVD, murmurs, rubs, gallops or clicks. No pedal edema. Gastrointestinal system: Abdomen is nondistended, soft and nontender. No organomegaly or masses felt. Normal bowel sounds heard. Central nervous system: Alert and awake Extremities: No edema, no clubbing ,no cyanosis Skin: No rashes, lesions or ulcers,no icterus ,no pallor    Data Reviewed: I have personally reviewed following labs and imaging studies  CBC: Recent Labs  Lab 01/14/20 1435 01/15/20 0853 01/16/20 0733 01/16/20 1649 01/17/20 0540 01/20/20 0928  WBC 9.0 8.9 8.9  --  12.6* 10.0  HGB 7.2* 8.6* 6.8* 8.7* 9.6* 10.3*  HCT 21.9* 26.5* 20.6* 26.6* 28.5* 32.8*  MCV 90.9 92.3 90.7  --  88.5 96.2  PLT 330 425*  443*  --  531* 772*   Basic Metabolic Panel: Recent Labs  Lab 01/14/20 0825 01/15/20 0826 01/15/20 0853 01/16/20 0733 01/17/20 0540 01/18/20 1210  NA 141 141 142 138 139 136  K 2.8* 3.0* 3.1* 3.7 3.3* 3.7  CL 103 100 100 103 104 101  CO2 30 30 30 26 28 26   GLUCOSE 92 346* 74 108* 134* 82  BUN 6* 6* 6* 9 8 5*  CREATININE 0.60 0.67 0.71 0.59 0.68 0.65  CALCIUM 8.2* 8.0* 8.6* 8.4* 8.7* 9.1  MG 1.3*  --  1.7 1.5* 1.8 1.7  PHOS  --   --  1.3* 2.2* 2.9 3.3   GFR: Estimated Creatinine Clearance: 43.9 mL/min (by C-G formula based on SCr of 0.65 mg/dL). Liver Function Tests: Recent Labs  Lab 01/15/20 0853  AST 32  ALT 17  ALKPHOS 58  BILITOT 0.7  PROT 5.2*  ALBUMIN 2.5*   No results for input(s): LIPASE, AMYLASE in the last 168 hours. Recent Labs  Lab 01/19/20 1114  AMMONIA 30   Coagulation Profile: No results for input(s): INR, PROTIME in the last 168 hours. Cardiac Enzymes: No results for input(s): CKTOTAL, CKMB, CKMBINDEX, TROPONINI in the last 168 hours. BNP (last 3 results) No results for input(s): PROBNP in the last 8760 hours. HbA1C: No results for input(s): HGBA1C in the last 72 hours. CBG: Recent Labs  Lab 01/17/20 0346  01/17/20 0611 01/17/20 0829 01/17/20 1258 01/19/20 2149  GLUCAP 142* 125* 100* 135* 86   Lipid Profile: No results for input(s): CHOL, HDL, LDLCALC, TRIG, CHOLHDL, LDLDIRECT in the last 72 hours. Thyroid Function Tests: No results for input(s): TSH, T4TOTAL, FREET4, T3FREE, THYROIDAB in the last 72 hours. Anemia Panel: Recent Labs    01/19/20 1114  VITAMINB12 483   Sepsis Labs: No results for input(s): PROCALCITON, LATICACIDVEN in the last 168 hours.  No results found for this or any previous visit (from the past 240 hour(s)).       Radiology Studies: MR BRAIN WO CONTRAST  Result Date: 01/20/2020 CLINICAL DATA:  Mental status change, unknown cause. EXAM: MRI HEAD WITHOUT CONTRAST TECHNIQUE: Multiplanar, multiecho pulse sequences of the brain and surrounding structures were obtained without intravenous contrast. COMPARISON:  Head CT 03/08/2019. Report from brain MRI 05/08/2007 (images unavailable). FINDINGS: Brain: Mild cerebral and cerebellar atrophy. Chronic cortical/subcortical left PCA territory infarct within the left occipital lobe. Chronic lacunar infarct within the right lentiform nucleus. Background mild to moderate multifocal T2/FLAIR hyperintensity within the cerebral white matter and pons, nonspecific but compatible with chronic small vessel ischemic disease. There are a few small scattered supratentorial foci of SWI signal loss, likely reflecting nonspecific chronic microhemorrhages. There is no acute infarct. No evidence of intracranial mass. No extra-axial fluid collection. No midline shift. Vascular: Expected proximal arterial flow voids. Skull and upper cervical spine: No focal suspicious marrow lesion. Sinuses/Orbits: Visualized orbits show no acute finding. Trace ethmoid sinus mucosal thickening. IMPRESSION: No evidence of acute intracranial abnormality. Specifically, there is no evidence of acute infarction. Redemonstrated chronic cortical/subcortical left PCA  territory infarct. Chronic right basal ganglia lacunar infarct. Background mild generalized atrophy of the brain and mild-to-moderate chronic small vessel ischemic disease. Electronically Signed   By: 07/08/2007 DO   On: 01/20/2020 11:15        Scheduled Meds: . acetaminophen  650 mg Oral Q6H  . amLODipine  5 mg Oral Daily  . amoxicillin  1,000 mg Oral Q12H  . Chlorhexidine Gluconate Cloth  6 each  Topical Daily  . clarithromycin  500 mg Oral Q12H  . dextrose  25 g Intravenous Once  . enoxaparin (LOVENOX) injection  30 mg Subcutaneous Q24H  . feeding supplement  237 mL Oral BID BM  . influenza vaccine adjuvanted  0.5 mL Intramuscular Tomorrow-1000  . lidocaine  1 patch Transdermal Q24H  . lip balm  1 application Topical BID  . mouth rinse  15 mL Mouth Rinse BID  . meningococcal oligosaccharide  0.5 mL Intramuscular Once  . methocarbamol  750 mg Oral TID  . multivitamin with minerals  1 tablet Oral Daily  . pantoprazole  40 mg Oral BID  . QUEtiapine  25 mg Oral QHS  . sodium chloride flush  10-40 mL Intracatheter Q12H  . thiamine  100 mg Oral Daily   Or  . thiamine  100 mg Intravenous Daily   Continuous Infusions: . sodium chloride Stopped (01/13/20 0337)  . ondansetron (ZOFRAN) IV       LOS: 11 days    Time spent: 25 mins.More than 50% of that time was spent in counseling and/or coordination of care.      Burnadette Pop, MD [facility] P1/19/2022, 7:48 AM

## 2020-01-21 NOTE — Progress Notes (Signed)
SLP Cancellation Note  Patient Details Name: Tracy Barton MRN: [id number] DOB: 1945/02/28   Cancelled treatment:       Reason Eval/Treat Not Completed: Other (comment) (pt sleeping and RN requested SlP wait to see her until she is awake, RN states pt is not having difficulty swallowing from her observations)  Tracy Infante, MS [facility] SLP Acute Rehab Services Office [phone] Pager [phone]   [name] 01/21/2020, 10:32 AM

## 2020-01-21 NOTE — Progress Notes (Signed)
Pt refusing vitals at this time, being verbally aggressive and agitated. Pt states 0/10 pain. Sitter in room.

## 2020-01-22 LAB — BASIC METABOLIC PANEL
Anion gap: 8 (ref 5–15)
BUN: 9 mg/dL (ref 8–23)
CO2: 24 mmol/L (ref 22–32)
Calcium: 8.9 mg/dL (ref 8.9–10.3)
Chloride: 107 mmol/L (ref 98–111)
Creatinine, Ser: 0.78 mg/dL (ref 0.44–1.00)
GFR, Estimated: >60 mL/min (ref >60)
Glucose, Bld: 76 mg/dL (ref 70–99)
Potassium: 3.5 mmol/L (ref 3.5–5.1)
Sodium: 139 mmol/L (ref 135–145)

## 2020-01-22 LAB — CBC WITH DIFFERENTIAL/PLATELET
Abs Immature Granulocytes: 0.03 10*3/uL (ref 0.00–0.07)
Basophils Absolute: 0.1 10*3/uL (ref 0.0–0.1)
Basophils Relative: 1 %
Eosinophils Absolute: 0.1 10*3/uL (ref 0.0–0.5)
Eosinophils Relative: 1 %
HCT: 29.4 % — ABNORMAL LOW (ref 36.0–46.0)
Hemoglobin: 9.4 g/dL — ABNORMAL LOW (ref 12.0–15.0)
Immature Granulocytes: 0 %
Lymphocytes Relative: 24 %
Lymphs Abs: 2 10*3/uL (ref 0.7–4.0)
MCH: 29.5 pg (ref 26.0–34.0)
MCHC: 32 g/dL (ref 30.0–36.0)
MCV: 92.2 fL (ref 80.0–100.0)
Monocytes Absolute: 0.7 10*3/uL (ref 0.1–1.0)
Monocytes Relative: 9 %
Neutro Abs: 5.6 10*3/uL (ref 1.7–7.7)
Neutrophils Relative %: 65 %
Platelets: 790 10*3/uL — ABNORMAL HIGH (ref 150–400)
RBC: 3.19 MIL/uL — ABNORMAL LOW (ref 3.87–5.11)
RDW: 13.7 % (ref 11.5–15.5)
WBC: 8.5 10*3/uL (ref 4.0–10.5)
nRBC: 0 % (ref 0.0–0.2)

## 2020-01-22 MED ORDER — THIAMINE HCL 100 MG PO TABS
100.0000 mg | ORAL_TABLET | Freq: Every day | ORAL | Status: DC
  Administered 2020-01-22 – 2020-01-23 (×2): 100 mg via ORAL
  Filled 2020-01-22 (×2): qty 1

## 2020-01-22 NOTE — Progress Notes (Signed)
Patient refused this am x 3 to have IV inserted, states" Im too fragile and my skin is too thin, anything he wants to give me in my vein, he can have me swallow it."  Md made aware

## 2020-01-22 NOTE — Progress Notes (Signed)
Assessment & Plan: POD#12 Perforated gastric ulcer S/p laparoscopic graham patch, laparoscopic splenectomy - 01/10/20 Dr. [name] -  Tolerating SOFT diet, TPN has been stopped - continue treatment for Hpylori for 2 weeks (triple therapy) - from a surgical perspective this patient is stable for discharge  -  Mental status waxing and waning, per primary service with known history of chronic alcohol abuse and likely vascular dementia based on MRI head.         [name], [facility]       Office: [phone]   Chief Complaint: Perforated ulcer  Subjective: Resting comfortably - arouses to voice. Oriented to person, does not remember that she had surgery. Reports mild upper abdominal soreness, tolerating PO.  Objective: Vital signs in last 24 hours: Temp:  [98.4 F (36.9 C)-99.7 F (37.6 C)] 99.1 F (37.3 C) (01/20 0528) Pulse Rate:  [69-91] 71 (01/20 0528) Resp:  [16-18] 16 (01/20 0528) BP: (138-145)/(58-80) 142/80 (01/20 0528) SpO2:  [100 %] 100 % (01/20 0528) Last BM Date: 01/18/20  Intake/Output from previous day: 01/19 0701 - 01/20 0700 In: 600 [P.O.:600] Out: 100 [Urine:100] Intake/Output this shift: No intake/output data recorded.  Physical Exam: HEENT - sclerae clear, mucous membranes moist Neck - soft Abdomen - soft without distension; incisions dry and intact Ext - no edema, non-tender  Lab Results:  Recent Labs    01/20/20 0928 01/22/20 0536  WBC 10.0 8.5  HGB 10.3* 9.4*  HCT 32.8* 29.4*  PLT 772* 790*   BMET Recent Labs    01/22/20 0536  NA 139  K 3.5  CL 107  CO2 24  GLUCOSE 76  BUN 9  CREATININE 0.78  CALCIUM 8.9   PT/INR No results for input(s): LABPROT, INR in the last 72 hours. Comprehensive Metabolic Panel:    Component Value Date/Time   NA 139 01/22/2020 0536   NA 136 01/18/2020 1210   K 3.5 01/22/2020 0536   K 3.7 01/18/2020 1210   CL 107 01/22/2020 0536   CL 101 01/18/2020 1210    CO2 24 01/22/2020 0536   CO2 26 01/18/2020 1210   BUN 9 01/22/2020 0536   BUN 5 (L) 01/18/2020 1210   CREATININE 0.78 01/22/2020 0536   CREATININE 0.65 01/18/2020 1210   CREATININE 0.86 09/21/2014 1214   CREATININE 0.80 04/15/2014 1238   GLUCOSE 76 01/22/2020 0536   GLUCOSE 82 01/18/2020 1210   CALCIUM 8.9 01/22/2020 0536   CALCIUM 9.1 01/18/2020 1210   AST 32 01/15/2020 0853   AST 29 01/10/2020 0825   ALT 17 01/15/2020 0853   ALT 8 01/10/2020 0825   ALKPHOS 58 01/15/2020 0853   ALKPHOS 74 01/10/2020 0825   BILITOT 0.7 01/15/2020 0853   BILITOT 1.0 01/10/2020 0825   PROT 5.2 (L) 01/15/2020 0853   PROT 6.9 01/10/2020 0825   ALBUMIN 2.5 (L) 01/15/2020 0853   ALBUMIN 3.9 01/10/2020 0825    Studies/Results: MR BRAIN WO CONTRAST  Result Date: 01/20/2020 CLINICAL DATA:  Mental status change, unknown cause. EXAM: MRI HEAD WITHOUT CONTRAST TECHNIQUE: Multiplanar, multiecho pulse sequences of the brain and surrounding structures were obtained without intravenous contrast. COMPARISON:  Head CT 03/08/2019. Report from brain MRI 05/08/2007 (images unavailable). FINDINGS: Brain: Mild cerebral and cerebellar atrophy. Chronic cortical/subcortical left PCA territory infarct within the left occipital lobe. Chronic lacunar infarct within the right lentiform nucleus. Background mild to moderate multifocal T2/FLAIR hyperintensity within the cerebral white  matter and pons, nonspecific but compatible with chronic small vessel ischemic disease. There are a few small scattered supratentorial foci of SWI signal loss, likely reflecting nonspecific chronic microhemorrhages. There is no acute infarct. No evidence of intracranial mass. No extra-axial fluid collection. No midline shift. Vascular: Expected proximal arterial flow voids. Skull and upper cervical spine: No focal suspicious marrow lesion. Sinuses/Orbits: Visualized orbits show no acute finding. Trace ethmoid sinus mucosal thickening. IMPRESSION: No  evidence of acute intracranial abnormality. Specifically, there is no evidence of acute infarction. Redemonstrated chronic cortical/subcortical left PCA territory infarct. Chronic right basal ganglia lacunar infarct. Background mild generalized atrophy of the brain and mild-to-moderate chronic small vessel ischemic disease. Electronically Signed   By: Jackey Loge DO   On: 01/20/2020 11:15      Tracy Barton 01/22/2020  Patient ID: Tracy Barton, female   DOB: 04-03-1945, 75 y.o.   MRN: [id number]

## 2020-01-22 NOTE — Progress Notes (Signed)
PROGRESS NOTE    Tracy Barton  NGE:[id number] DOB: 1945/02/23 DOA: 01/10/2020 PCP: Tracy Barton, No Pcp Per   Chief Complain: Abdominal pain  Brief Narrative: Tracy Barton is a 75 year old female with history of hepatitis C, chronic alcohol abuse who presented to the emergency department complaints of abdominal pain.  She was found to have perforated gastric ulcer requiring emergent surgery.  Hospital course complicated by alcohol withdrawal symptoms, persistent altered mental status.  She is alert and oriented to place and person only.  MRI of the brain did not show any acute endocrine abnormality but showed chronic cortical/subcortical left PCA territory infract.  Dementia was suspected from multiple infarcts as seen on the brain imagings.  Currently she is waiting for skilled nursing facility placement.  Mental status looks slowly improving.  Assessment & Plan:   Principal Problem:   Perforated prepyloric gastric ulcer s/p omental Cheree Ditto patch 01/10/2020 Active Problems:   Smoker   Gastritis and gastroduodenitis   Trigeminal neuralgia   Hep C w/o coma, chronic (HCC)   History of medication noncompliance   ARF (acute renal failure) (HCC)   S/P laparoscopic splenectomy   Protein-calorie malnutrition, severe   Perforated gastric ulcer: Status post laparoscopic Graham's patch, laparoscopic splenectomy.  H. pylori came back to be positive.  She has been started on 2 weeks course of amoxicillin , clarithromycin and PPI twice daily.  Altered mental status: History of chronic alcohol abuse.  Brain imagings did not show any acute intracranial abnormality but showed previous infarcts.  She might have underlying dementia.   This could also be from Warnicke's encephalopathy from chronic alcohol abuse.  Normal vitamin B12, ammonia level, normal TSH.  Thiamine level pending.  We wanted to start on high-dose thiamine supplementation,but she does not have IV access and continues to deny putting a new IV  line. She required restraints during this hospitalization.  We have discontinued restraints and sitter. continue Seroquel oral. Continue oral thiamine supplementation.  She looks much more alert and awake today  Chronic alcohol abuse: She has been a heavy drinker as per her daughter.  Continue thiamine and folic acid.  Possibility of Warnicke's encephalopathy.  Thiamine level is pending.  Tracy Barton denies IV thiamine.  AKI: Resolved  Hypokalemia/hypophosphatemia: Being monitored and supplemented  Acute blood loss normocytic anemia: Likely associated  with surgery and also chronic medical conditions.  She was given a unit of PRBC on 01/16/2020.  Currently hemoglobin stable.  Severe protein calorie moderation: Nutrition following.  Chronic hepatitis C plan: We  recommend outpatient treatment  Hypertension: Continue Norvasc.  Monitor blood pressure    Nutrition Problem: Severe Malnutrition Etiology: chronic illness      DVT prophylaxis: Lovenox Code Status full code:  Family Communication: Daughter on phone on 01/21/20 Status is: Inpatient  Remains inpatient appropriate because:Unsafe d/c plan   Dispo: The Tracy Barton is from: Home              Anticipated d/c is to: SNF              Anticipated d/c date is: As  Soon as bed is available              Tracy Barton currently is medically stable for dc    Consultants: Surgery  Procedures:As above  Antimicrobials:  Anti-infectives (From admission, onward)   Start     Dose/Rate Route Frequency Ordered Stop   01/20/20 1530  clarithromycin (BIAXIN) tablet 500 mg  Status:  Discontinued  500 mg Oral Every 12 hours 01/20/20 1433 01/20/20 1437   01/20/20 1530  amoxicillin (AMOXIL) tablet 1,000 mg  Status:  Discontinued       Note to Pharmacy: H pylori eradication   1,000 mg Oral Every 12 hours 01/20/20 1436 01/20/20 1436   01/20/20 1530  amoxicillin (AMOXIL) capsule 1,000 mg       Note to Pharmacy: H pylori eradication   1,000 mg  Oral Every 12 hours 01/20/20 1436 02/03/20 0959   01/20/20 1530  clarithromycin (BIAXIN) tablet 500 mg        500 mg Oral Every 12 hours 01/20/20 1437 02/03/20 0959   01/12/20 0600  piperacillin-tazobactam (ZOSYN) IVPB 3.375 g  Status:  Discontinued        3.375 g 12.5 mL/hr over 240 Minutes Intravenous Every 8 hours 01/12/20 0358 01/16/20 0809   01/11/20 0000  piperacillin-tazobactam (ZOSYN) IVPB 2.25 g  Status:  Discontinued        2.25 g 100 mL/hr over 30 Minutes Intravenous Every 6 hours 01/10/20 2139 01/12/20 0358   01/10/20 2215  piperacillin-tazobactam (ZOSYN) IVPB 3.375 g  Status:  Discontinued        3.375 g 12.5 mL/hr over 240 Minutes Intravenous Every 8 hours 01/10/20 2122 01/10/20 2139   01/10/20 1200  cefoTEtan (CEFOTAN) 2 g in sodium chloride 0.9 % 100 mL IVPB        2 g 200 mL/hr over 30 Minutes Intravenous To [facility] 01/10/20 1133 01/11/20 0700   01/10/20 1115  piperacillin-tazobactam (ZOSYN) IVPB 3.375 g        3.375 g 100 mL/hr over 30 Minutes Intravenous  Once 01/10/20 1106 01/10/20 1200      Subjective:  Tracy Barton seen and examined at bedside this morning.  She actually looked much better today.  She was sitting on the bed.  Alert and awake.  Communicated well but is confused with time.  Not in any kind of discomfort.  Objective: Vitals:   01/21/20 1333 01/21/20 2056 01/22/20 0528 01/22/20 1343  BP: (!) 145/76 (!) 138/58 (!) 142/80 (!) 147/90  Pulse: 91 69 71 77  Resp: 18 16 16 16   Temp: 98.4 F (36.9 C) 99.7 F (37.6 C) 99.1 F (37.3 C) 98.5 F (36.9 C)  TempSrc: Oral Oral Oral Oral  SpO2: 100% 100% 100% 100%  Weight:      Height:        Intake/Output Summary (Last 24 hours) at 01/22/2020 1634 Last data filed at 01/22/2020 1016 Gross per 24 hour  Intake 360 ml  Output 0 ml  Net 360 ml   Filed Weights   01/10/20 1401 01/10/20 1901 01/15/20 1400  Weight: 49 kg 38.7 kg 45.8 kg    Examination:  General exam: Overall comfortable,  malnourished, thin built Respiratory system:  no wheezes or crackles  Cardiovascular system: S1 & S2 heard, RRR. No JVD, murmurs, rubs, gallops or clicks. Gastrointestinal system: Abdomen is nondistended, soft and nontender. No organomegaly or masses felt. Normal bowel sounds heard. Central nervous system: Alert and awake and  Oriented to place and person only. No focal neurological deficits. Extremities: No edema, no clubbing ,no cyanosis Skin: No rashes, lesions or ulcers,no icterus ,no pallor Psychiatry: Judgement and insight appear impaired    Data Reviewed: I have personally reviewed following labs and imaging studies  CBC: Recent Labs  Lab 01/16/20 0733 01/16/20 1649 01/17/20 0540 01/20/20 0928 01/22/20 0536  WBC 8.9  --  12.6* 10.0 8.5  NEUTROABS  --   --   --   --  5.6  HGB 6.8* 8.7* 9.6* 10.3* 9.4*  HCT 20.6* 26.6* 28.5* 32.8* 29.4*  MCV 90.7  --  88.5 96.2 92.2  PLT 443*  --  531* 772* 790*   Basic Metabolic Panel: Recent Labs  Lab 01/16/20 0733 01/17/20 0540 01/18/20 1210 01/22/20 0536  NA 138 139 136 139  K 3.7 3.3* 3.7 3.5  CL 103 104 101 107  CO2 26 28 26 24   GLUCOSE 108* 134* 82 76  BUN 9 8 5* 9  CREATININE 0.59 0.68 0.65 0.78  CALCIUM 8.4* 8.7* 9.1 8.9  MG 1.5* 1.8 1.7  --   PHOS 2.2* 2.9 3.3  --    GFR: Estimated Creatinine Clearance: 43.9 mL/min (by C-G formula based on SCr of 0.78 mg/dL). Liver Function Tests: No results for input(s): AST, ALT, ALKPHOS, BILITOT, PROT, ALBUMIN in the last 168 hours. No results for input(s): LIPASE, AMYLASE in the last 168 hours. Recent Labs  Lab 01/19/20 1114  AMMONIA 30   Coagulation Profile: No results for input(s): INR, PROTIME in the last 168 hours. Cardiac Enzymes: No results for input(s): CKTOTAL, CKMB, CKMBINDEX, TROPONINI in the last 168 hours. BNP (last 3 results) No results for input(s): PROBNP in the last 8760 hours. HbA1C: No results for input(s): HGBA1C in the last 72 hours. CBG: Recent  Labs  Lab 01/17/20 0346 01/17/20 0611 01/17/20 0829 01/17/20 1258 01/19/20 2149  GLUCAP 142* 125* 100* 135* 86   Lipid Profile: No results for input(s): CHOL, HDL, LDLCALC, TRIG, CHOLHDL, LDLDIRECT in the last 72 hours. Thyroid Function Tests: No results for input(s): TSH, T4TOTAL, FREET4, T3FREE, THYROIDAB in the last 72 hours. Anemia Panel: No results for input(s): VITAMINB12, FOLATE, FERRITIN, TIBC, IRON, RETICCTPCT in the last 72 hours. Sepsis Labs: No results for input(s): PROCALCITON, LATICACIDVEN in the last 168 hours.  No results found for this or any previous visit (from the past 240 hour(s)).       Radiology Studies: No results found.      Scheduled Meds: . acetaminophen  650 mg Oral Q6H  . amLODipine  5 mg Oral Daily  . amoxicillin  1,000 mg Oral Q12H  . Chlorhexidine Gluconate Cloth  6 each Topical Daily  . clarithromycin  500 mg Oral Q12H  . enoxaparin (LOVENOX) injection  30 mg Subcutaneous Q24H  . feeding supplement  237 mL Oral BID BM  . folic acid  1 mg Oral Daily  . influenza vaccine adjuvanted  0.5 mL Intramuscular Tomorrow-1000  . lidocaine  1 patch Transdermal Q24H  . lip balm  1 application Topical BID  . mouth rinse  15 mL Mouth Rinse BID  . meningococcal oligosaccharide  0.5 mL Intramuscular Once  . methocarbamol  750 mg Oral TID  . multivitamin with minerals  1 tablet Oral Daily  . pantoprazole  40 mg Oral BID  . QUEtiapine  25 mg Oral QHS  . sodium chloride flush  10-40 mL Intracatheter Q12H  . thiamine  100 mg Oral Daily   Continuous Infusions: . sodium chloride Stopped (01/13/20 0337)  . ondansetron (ZOFRAN) IV    . thiamine injection       LOS: 12 days    Time spent: 25 mins.More than 50% of that time was spent in counseling and/or coordination of care.      03/12/20, MD [facility] P1/20/2022, 4:34 PM

## 2020-01-22 NOTE — TOC Progression Note (Signed)
Transition of Care [facility]) - Progression Note    Patient Details  Name: Tracy Barton MRN: [id number] Date of Birth: 08/18/1945  Transition of Care [facility]) CM/SW Contact  Clearance Coots, LCSW Phone Number: 01/22/2020, 4:24 PM  Clinical Narrative:    CSW reached out to the daughter Cherly Hensen, left a voicemail. CSW reached out to the son Dorinda Hill. CSW informed son of patient improvement with PT and her ability make her own bed this morning with nurse supervision. Son says his wife is sick therefore he is in quarantine. The patient children are discussing a plan for the patient. Son to follow up with nursing staff this evening and csw in the am.   Expected Discharge Plan: Skilled Nursing Facility Barriers to Discharge: No SNF bed  Expected Discharge Plan and Services Expected Discharge Plan: Skilled Nursing Facility   Discharge Planning Services: CM Consult   Living arrangements for the past 2 months: Apartment                                       Social Determinants of Health (SDOH) Interventions    Readmission Risk Interventions No flowsheet data found.

## 2020-01-23 DIAGNOSIS — I10 Essential (primary) hypertension: Secondary | ICD-10-CM | POA: Diagnosis present

## 2020-01-23 DIAGNOSIS — B9681 Helicobacter pylori [H. pylori] as the cause of diseases classified elsewhere: Secondary | ICD-10-CM

## 2020-01-23 DIAGNOSIS — G9341 Metabolic encephalopathy: Secondary | ICD-10-CM

## 2020-01-23 DIAGNOSIS — E876 Hypokalemia: Secondary | ICD-10-CM | POA: Diagnosis not present

## 2020-01-23 DIAGNOSIS — D62 Acute posthemorrhagic anemia: Secondary | ICD-10-CM | POA: Diagnosis present

## 2020-01-23 DIAGNOSIS — F1010 Alcohol abuse, uncomplicated: Secondary | ICD-10-CM

## 2020-01-23 DIAGNOSIS — K279 Peptic ulcer, site unspecified, unspecified as acute or chronic, without hemorrhage or perforation: Secondary | ICD-10-CM

## 2020-01-23 DIAGNOSIS — E43 Unspecified severe protein-calorie malnutrition: Secondary | ICD-10-CM

## 2020-01-23 MED ORDER — PANTOPRAZOLE SODIUM 40 MG PO TBEC: 40.0000 mg | DELAYED_RELEASE_TABLET | Freq: Two times a day (BID) | ORAL | 3 refills | Status: DC

## 2020-01-23 MED ORDER — ENSURE ENLIVE PO LIQD
237.0000 mL | Freq: Three times a day (TID) | ORAL | Status: DC
  Administered 2020-01-23 (×2): 237 mL via ORAL

## 2020-01-23 MED ORDER — FOLIC ACID 1 MG PO TABS: 1.0000 mg | ORAL_TABLET | Freq: Every day | ORAL | 3 refills | Status: DC

## 2020-01-23 MED ORDER — AMOXICILLIN 500 MG PO CAPS: 1000.0000 mg | ORAL_CAPSULE | Freq: Two times a day (BID) | ORAL | 0 refills | Status: DC

## 2020-01-23 MED ORDER — AMLODIPINE BESYLATE 5 MG PO TABS: 5.0000 mg | ORAL_TABLET | Freq: Every day | ORAL | 3 refills | Status: DC

## 2020-01-23 MED ORDER — QUETIAPINE FUMARATE 25 MG PO TABS: 25.0000 mg | ORAL_TABLET | Freq: Every day | ORAL | 3 refills | Status: DC

## 2020-01-23 MED ORDER — LANSOPRAZOLE 30 MG PO CPDR: 30.0000 mg | DELAYED_RELEASE_CAPSULE | Freq: Every day | ORAL | 6 refills | Status: DC

## 2020-01-23 MED ORDER — THIAMINE HCL 100 MG PO TABS: 100.0000 mg | ORAL_TABLET | Freq: Every day | ORAL | 3 refills | Status: DC

## 2020-01-23 MED ORDER — LIDOCAINE 5 % EX PTCH: 1.0000 | MEDICATED_PATCH | CUTANEOUS | 0 refills | Status: DC

## 2020-01-23 MED ORDER — CLARITHROMYCIN 500 MG PO TABS: 500.0000 mg | ORAL_TABLET | Freq: Two times a day (BID) | ORAL | 0 refills | Status: DC

## 2020-01-23 MED ORDER — TRAMADOL HCL 50 MG PO TABS: 50.0000 mg | ORAL_TABLET | Freq: Four times a day (QID) | ORAL | 0 refills | Status: DC | PRN

## 2020-01-23 NOTE — Care Management Important Message (Signed)
Important Message  Patient Details IM Letter given to the Patient. Name: Tracy Barton MRN: [id number] Date of Birth: 04/24/1945   Medicare Important Message Given:  Yes     [name] 01/23/2020, 11:25 AM

## 2020-01-23 NOTE — TOC Transition Note (Addendum)
Transition of Care [facility]) - CM/SW Discharge Note   Patient Details  Name: Tracy Barton MRN: [id number] Date of Birth: 25-Sep-1945  [facility]) CM/SW Contact:  Clearance Coots, LCSW Phone Number: 01/23/2020, 3:30 PM   Clinical Narrative:   CSW received a call from the daughter Cherly Hensen to discuss patient discharge plan home. Patient is to stay with her son and DIL at discharge. Son Dorinda Hill to provide transportation home in private vehicle.   HHPT and social work arranged through [employer] ([facility]). CSW confirm son Dorinda Hill address: [address] [place], [place] [address].  PCP needs: Daughter reports the patient is active with [facility], Daughter educated to make a follow up appointment within the next two weeks.  Patient screened for DME needs.Per nurse, the patient is walking independently w/o a device. Up to bathroom w/ supervision.  No other needs identified.   Final next level of care: Home w Home Health Services Barriers to Discharge: Barriers Resolved   Patient Goals and CMS Choice Patient states their goals for this hospitalization and ongoing recovery are:: I GUESS TO GO BACK HOME CMS Medicare.gov Compare Post Acute Care list provided to:: Adult Children    Discharge Placement                       Discharge Plan and Services   Discharge Planning Services: CM Consult                      HH Arranged: PT,Social Work [facility] Agency: [facility] ([facility] Date HH Agency Contacted: 01/23/20 Time HH Agency Contacted: 1530 Representative spoke with at [facility] Agency: Pearson Grippe  Social Determinants of Health (SDOH) Interventions     Readmission Risk Interventions No flowsheet data found.

## 2020-01-23 NOTE — Discharge Summary (Addendum)
Physician Discharge Summary  Tracy Barton ASN:[id number] DOB: Aug 19, 1945 DOA: 01/10/2020  PCP: [facility]  Admit date: 01/10/2020 Discharge date: 01/23/2020  Admitted From: Home Discharge disposition: Home   Recommendations for Outpatient Follow-Up:   1. Needs close follow up to ensure compliance with BID PPI and antibiotics to treat H. Pylori. 2. F/U thiamine level. 3. Please F/U with Dr. [name].   Discharge Diagnosis:   Principal Problem:   Perforated prepyloric gastric ulcer s/p omental Cheree Ditto patch 01/10/2020 Active Problems:   Smoker   Gastritis and gastroduodenitis   Trigeminal neuralgia   Hep C w/o coma, chronic (HCC)   History of medication noncompliance   ARF (acute renal failure) (HCC)   S/P laparoscopic splenectomy   Protein-calorie malnutrition, severe   H pylori ulcer   Acute metabolic encephalopathy   Chronic alcohol abuse   Hypokalemia   Hypophosphatemia   Acute blood loss anemia   Essential hypertension   Discharge Condition: Improved.  Diet recommendation:  Regular as tolerated.  Wound care: Keep wounds clean/dry.  Code status: Full.   History of Present Illness:    Tracy Barton is an 75 y.o. female with history of hepatitis C, chronic alcohol abuse who presented to the emergency department with a complaint of abdominal pain. She was found to have perforated gastric ulcer requiring emergent surgery. Hospital course complicated by alcohol withdrawal symptoms, persistent altered mental status. She is alert and oriented to place and person only. MRI of the brain did not show any acute endocrine abnormality but showed chronic cortical/subcortical left PCA territory infract. Dementia was suspected from multiple infarcts as seen on the brain imagings.   Hospital Course by Problem:   Principle Problem: Perforated gastric ulcer in the setting of H. Pylori infection Status post laparoscopic Graham's patch, laparoscopic  splenectomy. H. pylori came back to be positive. Continue 2 week course of amoxicillin, clarithromycin and PPI twice daily.  Active Problems: Altered mental status/acute encephalopathy History of chronic alcohol abuse. Brain imaging did not show any acute intracranial abnormality but showed previous infarcts. Differential included underlying dementia, Wernicke's encephalopathy from chronic alcohol abuse. Work up included a normal vitamin B12, ammonia level, and TSH. Thiamine level pending.  High-dose thiamine supplementation unable to be done shedoes not have IV access and continues to deny placement of a new IV line. She required restraints/sitter during this hospitalization, no longer needs. Continue Seroquel and oral thiamine supplementation. Awake and alert today, but remains mildly disoriented.  Chronic alcohol abuse She has been a heavy drinker as per her daughter. Continue thiamine and folic acid. Possibility ofWernicke's encephalopathy.Thiamine level is pending.   AKI Resolved. Creatinine at baseline is 0.79-0.86.  Admission creatinine was 1.74. Creatine back to baseline by 01/13/20.  Hypokalemia/hypophosphatemia Being monitored and supplemented.  Acute blood loss normocytic anemia Likely associated with surgery and also chronic medical conditions. She was given a unit of PRBC on 01/16/2020. Currently hemoglobin stable.   Chronic hepatitis C We recommend outpatient follow up for consideration of treatment.  Hypertension Continue Norvasc. Monitor blood pressure.  Severe protein calorie moderation Dietician following. Assessment noted below.  I agree with this assessment. Nutritional status Nutrition Problem: Severe Malnutrition Etiology: chronic illness Signs/Symptoms: severe fat depletion,severe muscle depletion Interventions: Refer to RD note for recommendations  Body mass index is 17.33 kg/m.    Medical Consultants:    Surgery   Discharge  Exam:   Vitals:   01/23/20 0538 01/23/20 1402  BP: 121/68 116/69  Pulse: 63 70  Resp: 16 16  Temp:  99.6 F (37.6 C)  SpO2: 100% 100%   Vitals:   01/22/20 1343 01/22/20 2123 01/23/20 0538 01/23/20 1402  BP: (!) 147/90 (!) 153/74 121/68 116/69  Pulse: 77 68 63 70  Resp: 16 16 16 16   Temp: 98.5 F (36.9 C) 99.3 F (37.4 C)  99.6 F (37.6 C)  TempSrc: Oral Oral  Oral  SpO2: 100% 100% 100% 100%  Weight:      Height:        Exam: General: No acute distress. Thin/cachectic with fat and muscle loss. Cardiovascular: Heart sounds show a regular rate, and rhythm. No gallops or rubs. I/VI systolic murmur. No JVD. Lungs: Clear to auscultation bilaterally with good air movement. No rales, rhonchi or wheezes. Abdomen: Soft, nontender, nondistended with normal active bowel sounds. No masses. No hepatosplenomegaly. Neurological: Alert and oriented 2. Moves all extremities 4 with equal but diminished strength. Cranial nerves II through XII grossly intact. Skin: Warm and dry. No rashes or lesions. Extremities: No clubbing or cyanosis. No edema. Pedal pulses 2+. Psychiatric: Mood and affect are bright. Insight and judgment are impaired.   The results of significant diagnostics from this hospitalization (including imaging, microbiology, ancillary and laboratory) are listed below for reference.     Procedures and Diagnostic Studies:   CT ABDOMEN PELVIS WO CONTRAST  Result Date: 01/10/2020 CLINICAL DATA:  Abdominal pain. EXAM: CT ABDOMEN AND PELVIS WITHOUT CONTRAST TECHNIQUE: Multidetector CT imaging of the abdomen and pelvis was performed following the standard protocol without IV contrast. COMPARISON:  None. FINDINGS: Lower chest: No acute abnormality. Hepatobiliary: No focal liver abnormality is seen. No gallstones, gallbladder wall thickening, or biliary dilatation. Pancreas: Unremarkable. No pancreatic ductal dilatation or surrounding inflammatory changes. Spleen: Normal in size without  focal abnormality. Adrenals/Urinary Tract: Adrenal glands are unremarkable. Kidneys are normal, without renal calculi, focal lesion, or hydronephrosis. Bladder is unremarkable. Stomach/Bowel: Evaluation of the bowel is markedly limited due to the paucity of intra-abdominal fat and oral contrast. The stomach and small bowel are nondistended and grossly unremarkable. Colonic diverticuli are identified, particularly distally. No convincing evidence of diverticulitis. The remainder of the colon is grossly unremarkable. The appendix is best visualized on series 2, images 52 and 53, unremarkable in appearance and air-filled distally. Vascular/Lymphatic: Calcified atherosclerosis is seen in the tortuous nonaneurysmal aorta. No obvious adenopathy. Reproductive: Uterus and bilateral adnexa are unremarkable. Other: There is free throughout the abdomen. There is also a moderate amount of free fluid. Musculoskeletal: Mild AVN in the left femoral head. IMPRESSION: 1. Free air and free fluid in the abdomen and pelvis is identified. Findings are consistent with a bowel perforation but no source for the free air is otherwise seen. 2. Evaluation of the bowel is markedly limited due to the paucity of intra-abdominal fat and oral contrast. The appendix appears normal however. 3. Colonic diverticulosis without diverticulitis. 4. Calcified atherosclerosis in the tortuous nonaneurysmal aorta. 5. Mild AVN in the left femoral head. 6. Aortic atherosclerosis. Findings called to the patient's PA, 03/09/2020. Aortic Atherosclerosis (ICD10-I70.0). Electronically Signed   By: Arthor Captain III M.D   On: 01/10/2020 11:09   DG Abd 1 View  Result Date: 01/11/2020 CLINICAL DATA:  NG tube placement EXAM: ABDOMEN - 1 VIEW COMPARISON:  None. FINDINGS: The NG tube terminates in the stomach. IMPRESSION: NG tube terminates in the stomach. Electronically Signed   By: 03/10/2020 III M.D   On: 01/11/2020 16:32     Labs:  Basic Metabolic  Panel: Recent Labs  Lab 01/17/20 0540 01/18/20 1210 01/22/20 0536  NA 139 136 139  K 3.3* 3.7 3.5  CL 104 101 107  CO2 28 26 24   GLUCOSE 134* 82 76  BUN 8 5* 9  CREATININE 0.68 0.65 0.78  CALCIUM 8.7* 9.1 8.9  MG 1.8 1.7  --   PHOS 2.9 3.3  --    GFR Estimated Creatinine Clearance: 43.9 mL/min (by C-G formula based on SCr of 0.78 mg/dL).  Recent Labs  Lab 01/19/20 1114  AMMONIA 30   CBC: Recent Labs  Lab 01/16/20 1649 01/17/20 0540 01/20/20 0928 01/22/20 0536  WBC  --  12.6* 10.0 8.5  NEUTROABS  --   --   --  5.6  HGB 8.7* 9.6* 10.3* 9.4*  HCT 26.6* 28.5* 32.8* 29.4*  MCV  --  88.5 96.2 92.2  PLT  --  531* 772* 790*   CBG: Recent Labs  Lab 01/17/20 0346 01/17/20 0611 01/17/20 0829 01/17/20 1258 01/19/20 2149  GLUCAP 142* 125* 100* 135* 86     Discharge Instructions:   Discharge Instructions    Call MD for:  persistant nausea and vomiting   Complete by: As directed    Call MD for:  redness, tenderness, or signs of infection (pain, swelling, redness, odor or green/yellow discharge around incision site)   Complete by: As directed    Call MD for:  severe uncontrolled pain   Complete by: As directed    Call MD for:  temperature >100.4   Complete by: As directed    Diet - low sodium heart healthy   Complete by: As directed    Diet general   Complete by: As directed    Discharge instructions   Complete by: As directed    You were treated for a perforated ulcer.  It is very important you fill your prescriptions and take your medication exactly as prescribed so that the ulcer can heal.  Failure to do so can result in life-threatening complications.  You also must avoid alcohol, as alcohol can make the ulcer worse.   Discharge wound care:   Complete by: As directed    Keep wound clean and dry.  Notify your doctor if the wound opens or drains pus.   Increase activity slowly   Complete by: As directed      Allergies as of 01/23/2020      Reactions    Nsaids Other (See Comments)   PERFORATED ULCER      Medication List    STOP taking these medications   hydrOXYzine 25 MG tablet Commonly known as: ATARAX/VISTARIL   methylPREDNISolone 4 MG Tbpk tablet Commonly known as: MEDROL DOSEPAK   oxyCODONE-acetaminophen 5-325 MG tablet Commonly known as: Percocet   Potassium 99 MG Tabs   sucralfate 1 g tablet Commonly known as: CARAFATE   zoster vaccine live (PF) 19400 UNT/0.65ML injection Commonly known as: Zostavax     TAKE these medications   amLODipine 5 MG tablet Commonly known as: NORVASC Take 1 tablet (5 mg total) by mouth daily. Start taking on: January 24, 2020   amoxicillin 500 MG capsule Commonly known as: AMOXIL Take 2 capsules (1,000 mg total) by mouth every 12 (twelve) hours.   Bone Density 300-200 MG-UNIT Tabs Take 1 tablet by mouth 2 (two) times daily.   clarithromycin 500 MG tablet Commonly known as: BIAXIN Take 1 tablet (500 mg total) by mouth every 12 (twelve) hours.   folic acid 1  MG tablet Commonly known as: FOLVITE Take 1 tablet (1 mg total) by mouth daily. Start taking on: January 24, 2020   lansoprazole 30 MG capsule Commonly known as: Prevacid Take 1 capsule (30 mg total) by mouth daily at 12 noon. What changed: Another medication with the same name was removed. Continue taking this medication, and follow the directions you see here.   lidocaine 5 % Commonly known as: LIDODERM Place 1 patch onto the skin daily. Remove & Discard patch within 12 hours or as directed by MD Start taking on: January 24, 2020   pantoprazole 40 MG tablet Commonly known as: PROTONIX Take 1 tablet (40 mg total) by mouth 2 (two) times daily.   QUEtiapine 25 MG tablet Commonly known as: SEROQUEL Take 1 tablet (25 mg total) by mouth at bedtime.   thiamine 100 MG tablet Take 1 tablet (100 mg total) by mouth daily. Start taking on: January 24, 2020   traMADol 50 MG tablet Commonly known as: ULTRAM Take 1 tablet  (50 mg total) by mouth every 6 (six) hours as needed for moderate pain or severe pain.            Discharge Care Instructions  (From admission, onward)         Start     Ordered   01/23/20 0000  Discharge wound care:       Comments: Keep wound clean and dry.  Notify your doctor if the wound opens or drains pus.   01/23/20 1551          Follow-up Information    Karie Soda, MD Follow up on 01/20/2020.   Specialty: General Surgery Contact information: [address] [place] [address] [phone]        [facility] Follow up in 1 week(s).   Why: Hospital follow up. Contact information: [address] [place] [address] [phone]                Time coordinating discharge: 40 minutes.  Signed:  Hillery Aldo, MD   [facility] 01/23/2020, 4:01 PM

## 2020-01-23 NOTE — Progress Notes (Signed)
Patient's gait is steady with ambulation. Patient has been ambulating to the bathroom by herself without any problems. Patient is oriented to self and situation with periods of confusion without agitation.

## 2020-01-23 NOTE — Progress Notes (Signed)
Occupational Therapy Treatment Patient Details Name: Tracy Barton MRN: [id number] DOB: 02/27/1945 Today's Date: 01/23/2020    History of present illness Patient is a 75 year old female PMH of hepatitic C infection, alcohol abuse. Patient presented secondary to abdominal pain and found to have a perforated gastric ulcer s/p omental Phillip Heal patch 01/10/2020   OT comments  Spoke with nursing staff who states patient has been ambulating to bathroom by herself. Patient ambulated in hallway with OT with no loss of balance, able to safely navigate obstacles and carry on appropriate conversation. Patient appears back to her baseline, will discontinue acute OT services at this time. Please re-consult if new needs arise.   Follow Up Recommendations  No OT follow up    Equipment Recommendations  None recommended by OT       Precautions / Restrictions Restrictions Weight Bearing Restrictions: No       Mobility Bed Mobility Overal bed mobility: Modified Independent                Transfers Overall transfer level: Modified independent                    Balance Overall balance assessment: Modified Independent Sitting-balance support: Feet supported Sitting balance-Leahy Scale: Good     Standing balance support: No upper extremity supported Standing balance-Leahy Scale: Good Standing balance comment: ambulate in hallway without physical assistance of loss of balance                           ADL either performed or assessed with clinical judgement   ADL Overall ADL's : Modified independent                                       General ADL Comments: patient ambulate in hallway at mod I level with no loss of balance, able to maintain conversation and safely navigate obstacles. nursing staff reports patient has been ambulating to bathroom by herself               Cognition Arousal/Alertness: Awake/alert Behavior During Therapy: WFL  for tasks assessed/performed Overall Cognitive Status: Within Functional Limits for tasks assessed                                                     Pertinent Vitals/ Pain       Pain Assessment: No/denies pain      Progress Toward Goals  OT Goals(current goals can now be found in the care plan section)  Progress towards OT goals: Goals met/education completed, patient discharged from OT  Acute Rehab OT Goals Patient Stated Goal: get better OT Goal Formulation: All assessment and education complete, DC therapy  Plan All goals met and education completed, patient discharged from OT services       AM-PAC OT "6 Clicks" Daily Activity     Outcome Measure   Help from another person eating meals?: None Help from another person taking care of personal grooming?: None Help from another person toileting, which includes using toliet, bedpan, or urinal?: None Help from another person bathing (including washing, rinsing, drying)?: None Help from another person to put on and taking off regular upper body clothing?:  None Help from another person to put on and taking off regular lower body clothing?: None 6 Click Score: 24    End of Session Equipment Utilized During Treatment:  (none)  OT Visit Diagnosis: Other abnormalities of gait and mobility (R26.89)   Activity Tolerance Patient tolerated treatment well   Patient Left in bed;with call bell/phone within reach   Nurse Communication Mobility status        Time: 1761-6073 OT Time Calculation (min): 12 min  Charges: OT General Charges $OT Visit: 1 Visit OT Treatments $Self Care/Home Management : 8-22 mins  Delbert Phenix OT OT pager: [phone]   Rosemary Holms 01/23/2020, 12:56 PM

## 2020-01-23 NOTE — Progress Notes (Signed)
Discharge instructions given to patient and all questions were answered.  

## 2020-01-23 NOTE — Progress Notes (Addendum)
Progress Note    Tracy Barton  PNT:[id number] DOB: [birth year]  DOA: 01/10/2020 PCP: Patient, No Pcp Per    Brief Narrative:   Chief complaint: Abdominal pain  Medical records reviewed and are as summarized below:  Tracy Barton is an 75 y.o. female with history of hepatitis C, chronic alcohol abuse who presented to the emergency department with a complaint of abdominal pain.  She was found to have perforated gastric ulcer requiring emergent surgery.  Hospital course complicated by alcohol withdrawal symptoms, persistent altered mental status.  She is alert and oriented to place and person only.  MRI of the brain did not show any acute endocrine abnormality but showed chronic cortical/subcortical left PCA territory infract.  Dementia was suspected from multiple infarcts as seen on the brain imagings.  Currently she is waiting for skilled nursing facility placement.  Mental status looks slowly improving.  Assessment/Plan:   Principle Problem: Perforated gastric ulcer in the setting of H. Pylori infection Status post laparoscopic Graham's patch, laparoscopic splenectomy.  H. pylori came back to be positive. Continue 2 week course of amoxicillin, clarithromycin and PPI twice daily.  Active Problems: Altered mental status/acute encephalopathy History of chronic alcohol abuse.  Brain imaging did not show any acute intracranial abnormality but showed previous infarcts.  Differential included underlying dementia, Wernicke's encephalopathy from chronic alcohol abuse.  Work up included a normal vitamin B12, ammonia level, and TSH.  Thiamine level pending.  High-dose thiamine supplementation unable to be done she does not have IV access and continues to deny placement of a new IV line. She required restraints/sitter during this hospitalization, no longer needs. Continue Seroquel and oral thiamine supplementation.  Awake and alert today, but remains disoriented.  Chronic alcohol  abuse She has been a heavy drinker as per her daughter.  Continue thiamine and folic acid.  Possibility of Wernicke's encephalopathy.  Thiamine level is pending.   AKI Resolved. Creatinine at baseline is 0.79-0.86.  Admission creatinine was 1.74. Creatine back to baseline by 01/13/20.  Hypokalemia/hypophosphatemia Being monitored and supplemented.  Acute blood loss normocytic anemia Likely associated  with surgery and also chronic medical conditions.  She was given a unit of PRBC on 01/16/2020.  Currently hemoglobin stable.   Chronic hepatitis C We  recommend outpatient follow up for consideration of treatment.  Hypertension Continue Norvasc.  Monitor blood pressure.  Severe protein calorie moderation Dietician following. Assessment noted below.  I agree with this assessment. Nutritional status Nutrition Problem: Severe Malnutrition Etiology: chronic illness Signs/Symptoms: severe fat depletion,severe muscle depletion Interventions: Refer to RD note for recommendations  Body mass index is 17.33 kg/m.   Family Communication/Anticipated D/C date and plan/Code Status   DVT prophylaxis: enoxaparin (LOVENOX) injection 30 mg Start: 01/15/20 1600 SCDs Start: 01/10/20 2123 SCD's Start: 01/10/20 1133   Current Level of Care:: Level of care: Med-Surg Code Status: Full Code.  Family Communication: Family not updated today. Disposition Plan: Status is: Inpatient  Remains inpatient appropriate because:Awaiting disposition plan per TOC. No safe plan to d/c identified yet.   Dispo: The patient is from: Home              Anticipated d/c is to: SNF              Anticipated d/c date is: 1 day              Patient currently is medically stable to d/c.  Medical Consultants:    Surgery   Anti-Infectives:  Amoxicillin 01/20/20--->  Biaxin 01/20/20 --->   Subjective:   In good spirits. Denies pain, SOB, nausea/vomiting. Hungry. Disoriented.  Thinks it is November and  couldn't name the president. Knows she is in the hospital.  Objective:    Vitals:   01/22/20 0528 01/22/20 1343 01/22/20 2123 01/23/20 0538  BP: (!) 142/80 (!) 147/90 (!) 153/74 121/68  Pulse: 71 77 68 63  Resp: 16 16 16 16   Temp: 99.1 F (37.3 C) 98.5 F (36.9 C) 99.3 F (37.4 C)   TempSrc: Oral Oral Oral   SpO2: 100% 100% 100% 100%  Weight:      Height:        Intake/Output Summary (Last 24 hours) at 01/23/2020 0801 Last data filed at 01/22/2020 2258 Gross per 24 hour  Intake 370 ml  Output --  Net 370 ml   Filed Weights   01/10/20 1401 01/10/20 1901 01/15/20 1400  Weight: 49 kg 38.7 kg 45.8 kg    Exam: General: No acute distress. Thin/cachectic with fat and muscle loss. Cardiovascular: Heart sounds show a regular rate, and rhythm. No gallops or rubs. I/VI systolic murmur. No JVD. Lungs: Clear to auscultation bilaterally with good air movement. No rales, rhonchi or wheezes. Abdomen: Soft, nontender, nondistended with normal active bowel sounds. No masses. No hepatosplenomegaly. Neurological: Alert and oriented 2. Moves all extremities 4 with equal but diminished strength. Cranial nerves II through XII grossly intact. Skin: Warm and dry. No rashes or lesions. Extremities: No clubbing or cyanosis. No edema. Pedal pulses 2+. Psychiatric: Mood and affect are bright. Insight and judgment are impaired.  Data Reviewed:   I have personally reviewed following labs and imaging studies:  Labs: Labs show the following:   Basic Metabolic Panel: Recent Labs  Lab 01/17/20 0540 01/18/20 1210 01/22/20 0536  NA 139 136 139  K 3.3* 3.7 3.5  CL 104 101 107  CO2 28 26 24   GLUCOSE 134* 82 76  BUN 8 5* 9  CREATININE 0.68 0.65 0.78  CALCIUM 8.7* 9.1 8.9  MG 1.8 1.7  --   PHOS 2.9 3.3  --    GFR Estimated Creatinine Clearance: 43.9 mL/min (by C-G formula based on SCr of 0.78 mg/dL).  Recent Labs  Lab 01/19/20 1114  AMMONIA 30    CBC: Recent Labs  Lab  01/16/20 1649 01/17/20 0540 01/20/20 0928 01/22/20 0536  WBC  --  12.6* 10.0 8.5  NEUTROABS  --   --   --  5.6  HGB 8.7* 9.6* 10.3* 9.4*  HCT 26.6* 28.5* 32.8* 29.4*  MCV  --  88.5 96.2 92.2  PLT  --  531* 772* 790*   CBG: Recent Labs  Lab 01/17/20 0346 01/17/20 0611 01/17/20 0829 01/17/20 1258 01/19/20 2149  GLUCAP 142* 125* 100* 135* 86   Microbiology No results found for this or any previous visit (from the past 240 hour(s)).  Procedures and diagnostic studies:  No results found.  Medications:   . acetaminophen  650 mg Oral Q6H  . amLODipine  5 mg Oral Daily  . amoxicillin  1,000 mg Oral Q12H  . Chlorhexidine Gluconate Cloth  6 each Topical Daily  . clarithromycin  500 mg Oral Q12H  . enoxaparin (LOVENOX) injection  30 mg Subcutaneous Q24H  . feeding supplement  237 mL Oral BID BM  . folic acid  1 mg Oral Daily  . influenza vaccine adjuvanted  0.5 mL Intramuscular Tomorrow-1000  . lidocaine  1 patch Transdermal Q24H  . lip  balm  1 application Topical BID  . mouth rinse  15 mL Mouth Rinse BID  . meningococcal oligosaccharide  0.5 mL Intramuscular Once  . multivitamin with minerals  1 tablet Oral Daily  . pantoprazole  40 mg Oral BID  . QUEtiapine  25 mg Oral QHS  . sodium chloride flush  10-40 mL Intracatheter Q12H  . thiamine  100 mg Oral Daily   Continuous Infusions: . ondansetron (ZOFRAN) IV       LOS: 13 days   Hillery Aldo, MD  [facility]   [facility] How to contact the [facility] Attending or Consulting provider 7A - 7P or covering provider during after hours 7P -7A, for this patient?  1. Check the care team in [facility] and look for a) attending/consulting TR[facility] provider listed and b) the [facility] team listed 2. Log into [email or web address] and use [place]'s universal password to access. If you do not have the password, please contact the hospital operator. 3. Locate the [facility] provider you are looking for under [facility] and page to a number  that you can be directly reached. 4. If you still have difficulty reaching the provider, please page the [facility] (Director on Call) for the Hospitalists listed on amion for assistance.  01/23/2020, 8:01 AM

## 2020-01-23 NOTE — Progress Notes (Signed)
  Speech Language Pathology Treatment: Dysphagia  Patient Details Name: Tracy Barton MRN: [id number] DOB: 03/01/1945 Today's Date: 01/23/2020 Time: 6063-0160 SLP Time Calculation (min) (ACUTE ONLY): 10 min  Assessment / Plan / Recommendation Clinical Impression  Delayed cough after more than one minute noted - pt denies any symptoms of heartburn and states severe GERD in the past.  Provided pt with written strategies to mitigate reflux symptoms - especially eating small meals, avoiding NSAIDS, etc.  Pt presented with Yale 3 ounce water challenge which she performed without coughing within one minute of time.  Delayed cough noted which ? if could be due to reflux - but pt denies.  .  She does admit to coughing with liquids (pointing to right side of her throat) if she "washes" food into throat - - causing SLP to suspect some chronic dysphagia for which she has learned to compensate.  Pt is edentulous but states she has been eating well and is able to "masticate" burgers, etc  Min cues to repeat general aspiration and reflux precautions needed.  All education completed with pt and no SLP follow up needed.    HPI HPI: 75 yo female adm to [facility] with Perforated prepyloric gastric ulcer s/p omental Phillip Heal patch 01/10/2020    PMH + Smoker    Gastritis and gastroduodenitis    Trigeminal neuralgia    Hep C w/o coma, chronic (HCC)    History of medication noncompliance    ARF (acute renal failure) (HCC)    S/P laparoscopic splenectomy    Protein-calorie malnutrition, severe.  Swallow eval ordered.      SLP Plan  All goals met       Recommendations  Diet recommendations: Thin liquid;Regular Liquids provided via: Cup;Straw Medication Administration: Whole meds with puree Supervision: Patient able to self feed;Intermittent supervision to cue for compensatory strategies Compensations: Slow rate;Small sips/bites Postural Changes and/or Swallow Maneuvers: Seated upright 90 degrees;Upright 30-60 min after  meal                Oral Care Recommendations: Oral care BID Follow up Recommendations: None SLP Visit Diagnosis: Dysphagia, unspecified (R13.10) Plan: All goals met       GO              Kathleen Lime, MS [facility] SLP Acute Rehab Services Office [phone] Pager [phone]    Macario Golds 01/23/2020, 4:23 PM

## 2020-01-28 LAB — VITAMIN B1: Vitamin B1 (Thiamine): 156.9 nmol/L (ref 66.5–200.0)

## 2020-02-01 ENCOUNTER — Emergency Department (HOSPITAL_COMMUNITY): Payer: Medicare Other

## 2020-02-01 ENCOUNTER — Encounter (HOSPITAL_COMMUNITY): Payer: Self-pay

## 2020-02-01 ENCOUNTER — Inpatient Hospital Stay (HOSPITAL_COMMUNITY)
Admission: EM | Admit: 2020-02-01 | Discharge: 2020-02-04 | DRG: 896 | Disposition: A | Discharge status: Home Health | Payer: Medicare Other | Attending: Internal Medicine | Admitting: Internal Medicine

## 2020-02-01 ENCOUNTER — Other Ambulatory Visit: Payer: Self-pay

## 2020-02-01 DIAGNOSIS — R55 Syncope and collapse: Secondary | ICD-10-CM

## 2020-02-01 DIAGNOSIS — Z9181 History of falling: Secondary | ICD-10-CM

## 2020-02-01 DIAGNOSIS — Y900 Blood alcohol level of less than 20 mg/100 ml: Secondary | ICD-10-CM | POA: Diagnosis present

## 2020-02-01 DIAGNOSIS — I951 Orthostatic hypotension: Secondary | ICD-10-CM | POA: Diagnosis not present

## 2020-02-01 DIAGNOSIS — R634 Abnormal weight loss: Secondary | ICD-10-CM | POA: Diagnosis present

## 2020-02-01 DIAGNOSIS — K2970 Gastritis, unspecified, without bleeding: Secondary | ICD-10-CM

## 2020-02-01 DIAGNOSIS — Z79899 Other long term (current) drug therapy: Secondary | ICD-10-CM

## 2020-02-01 DIAGNOSIS — F0390 Unspecified dementia without behavioral disturbance: Secondary | ICD-10-CM | POA: Diagnosis present

## 2020-02-01 DIAGNOSIS — I10 Essential (primary) hypertension: Secondary | ICD-10-CM | POA: Diagnosis present

## 2020-02-01 DIAGNOSIS — Z823 Family history of stroke: Secondary | ICD-10-CM

## 2020-02-01 DIAGNOSIS — D649 Anemia, unspecified: Secondary | ICD-10-CM | POA: Diagnosis present

## 2020-02-01 DIAGNOSIS — R001 Bradycardia, unspecified: Secondary | ICD-10-CM | POA: Diagnosis present

## 2020-02-01 DIAGNOSIS — U071 COVID-19: Secondary | ICD-10-CM | POA: Diagnosis not present

## 2020-02-01 DIAGNOSIS — F10139 Alcohol abuse with withdrawal, unspecified: Secondary | ICD-10-CM | POA: Diagnosis not present

## 2020-02-01 DIAGNOSIS — F1010 Alcohol abuse, uncomplicated: Secondary | ICD-10-CM | POA: Diagnosis not present

## 2020-02-01 DIAGNOSIS — K255 Chronic or unspecified gastric ulcer with perforation: Secondary | ICD-10-CM | POA: Diagnosis present

## 2020-02-01 DIAGNOSIS — Y92003 Bedroom of unspecified non-institutional (private) residence as the place of occurrence of the external cause: Secondary | ICD-10-CM

## 2020-02-01 DIAGNOSIS — E43 Unspecified severe protein-calorie malnutrition: Secondary | ICD-10-CM | POA: Diagnosis present

## 2020-02-01 DIAGNOSIS — G928 Other toxic encephalopathy: Secondary | ICD-10-CM | POA: Diagnosis present

## 2020-02-01 DIAGNOSIS — Z681 Body mass index (BMI) 19 or less, adult: Secondary | ICD-10-CM

## 2020-02-01 DIAGNOSIS — R5383 Other fatigue: Secondary | ICD-10-CM | POA: Diagnosis present

## 2020-02-01 DIAGNOSIS — W06XXXA Fall from bed, initial encounter: Secondary | ICD-10-CM | POA: Diagnosis present

## 2020-02-01 DIAGNOSIS — G9341 Metabolic encephalopathy: Secondary | ICD-10-CM | POA: Diagnosis present

## 2020-02-01 DIAGNOSIS — K219 Gastro-esophageal reflux disease without esophagitis: Secondary | ICD-10-CM | POA: Diagnosis present

## 2020-02-01 DIAGNOSIS — F32A Depression, unspecified: Secondary | ICD-10-CM | POA: Diagnosis present

## 2020-02-01 DIAGNOSIS — Z886 Allergy status to analgesic agent status: Secondary | ICD-10-CM

## 2020-02-01 DIAGNOSIS — D75839 Thrombocytosis, unspecified: Secondary | ICD-10-CM | POA: Diagnosis present

## 2020-02-01 DIAGNOSIS — Z781 Physical restraint status: Secondary | ICD-10-CM

## 2020-02-01 DIAGNOSIS — Z20822 Contact with and (suspected) exposure to covid-19: Secondary | ICD-10-CM | POA: Diagnosis present

## 2020-02-01 DIAGNOSIS — K2990 Gastroduodenitis, unspecified, without bleeding: Secondary | ICD-10-CM

## 2020-02-01 LAB — CBC WITH DIFFERENTIAL/PLATELET
Abs Immature Granulocytes: 0.03 10*3/uL (ref 0.00–0.07)
Basophils Absolute: 0 10*3/uL (ref 0.0–0.1)
Basophils Relative: 0 %
Eosinophils Absolute: 0 10*3/uL (ref 0.0–0.5)
Eosinophils Relative: 1 %
HCT: 32.2 % — ABNORMAL LOW (ref 36.0–46.0)
Hemoglobin: 10 g/dL — ABNORMAL LOW (ref 12.0–15.0)
Immature Granulocytes: 0 %
Lymphocytes Relative: 26 %
Lymphs Abs: 1.8 10*3/uL (ref 0.7–4.0)
MCH: 28.8 pg (ref 26.0–34.0)
MCHC: 31.1 g/dL (ref 30.0–36.0)
MCV: 92.8 fL (ref 80.0–100.0)
Monocytes Absolute: 0.4 10*3/uL (ref 0.1–1.0)
Monocytes Relative: 5 %
Neutro Abs: 4.5 10*3/uL (ref 1.7–7.7)
Neutrophils Relative %: 68 %
Platelets: 524 10*3/uL — ABNORMAL HIGH (ref 150–400)
RBC: 3.47 MIL/uL — ABNORMAL LOW (ref 3.87–5.11)
RDW: 13.4 % (ref 11.5–15.5)
WBC: 6.8 10*3/uL (ref 4.0–10.5)
nRBC: 0 % (ref 0.0–0.2)

## 2020-02-01 LAB — RAPID URINE DRUG SCREEN, HOSP PERFORMED
Amphetamines: NOT DETECTED
Barbiturates: NOT DETECTED
Benzodiazepines: NOT DETECTED
Cocaine: NOT DETECTED
Opiates: NOT DETECTED
Tetrahydrocannabinol: NOT DETECTED

## 2020-02-01 LAB — COMPREHENSIVE METABOLIC PANEL
ALT: 25 U/L (ref 0–44)
AST: 30 U/L (ref 15–41)
Albumin: 3 g/dL — ABNORMAL LOW (ref 3.5–5.0)
Alkaline Phosphatase: 51 U/L (ref 38–126)
Anion gap: 11 (ref 5–15)
BUN: 16 mg/dL (ref 8–23)
CO2: 25 mmol/L (ref 22–32)
Calcium: 9.1 mg/dL (ref 8.9–10.3)
Chloride: 105 mmol/L (ref 98–111)
Creatinine, Ser: 0.97 mg/dL (ref 0.44–1.00)
GFR, Estimated: >60 mL/min (ref >60)
Glucose, Bld: 101 mg/dL — ABNORMAL HIGH (ref 70–99)
Potassium: 3.6 mmol/L (ref 3.5–5.1)
Sodium: 141 mmol/L (ref 135–145)
Total Bilirubin: 0.4 mg/dL (ref 0.3–1.2)
Total Protein: 5.8 g/dL — ABNORMAL LOW (ref 6.5–8.1)

## 2020-02-01 LAB — ACETAMINOPHEN LEVEL: Acetaminophen (Tylenol), Serum: 14 ug/mL (ref 10–30)

## 2020-02-01 LAB — PROTIME-INR
INR: 0.9 (ref 0.8–1.2)
Prothrombin Time: 12.2 seconds (ref 11.4–15.2)

## 2020-02-01 LAB — URINALYSIS, ROUTINE W REFLEX MICROSCOPIC
Bilirubin Urine: NEGATIVE
Glucose, UA: NEGATIVE mg/dL
Hgb urine dipstick: NEGATIVE
Ketones, ur: NEGATIVE mg/dL
Leukocytes,Ua: NEGATIVE
Nitrite: NEGATIVE
Protein, ur: NEGATIVE mg/dL
Specific Gravity, Urine: 1.006 (ref 1.005–1.030)
pH: 6 (ref 5.0–8.0)

## 2020-02-01 LAB — SARS CORONAVIRUS 2 (TAT 6-24 HRS): SARS Coronavirus 2: POSITIVE — AB

## 2020-02-01 LAB — SALICYLATE LEVEL: Salicylate Lvl: <7 mg/dL — ABNORMAL LOW (ref 7.0–30.0)

## 2020-02-01 LAB — MRSA PCR SCREENING: MRSA by PCR: NEGATIVE

## 2020-02-01 LAB — ETHANOL: Alcohol, Ethyl (B): <10 mg/dL (ref <10)

## 2020-02-01 IMAGING — CT CT HEAD W/O CM
5 series · 17 of 47 positions shown, 18 images · non-contrast
Comparison: MRI from [DATE]

CLINICAL DATA: Altered mental status

EXAM:
CT HEAD WITHOUT CONTRAST
TECHNIQUE: Contiguous axial images were obtained from the base of the skull
through the vertex without intravenous contrast.

[Series 3: head bone · axial · 0.41mm/px · z∈[-67,-33]mm · 3 of 72 slices shown]
[im 9/72  bone]
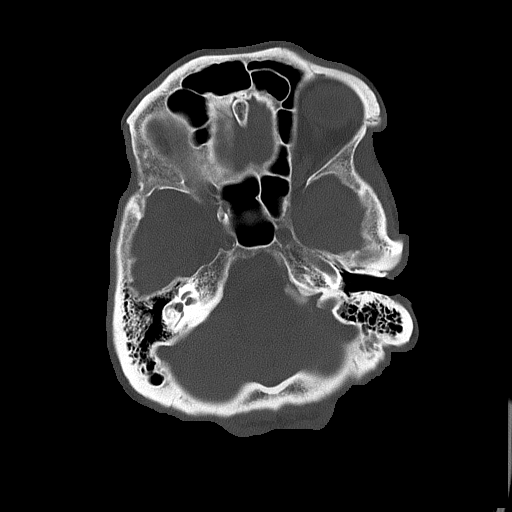
[im 17/72  bone]
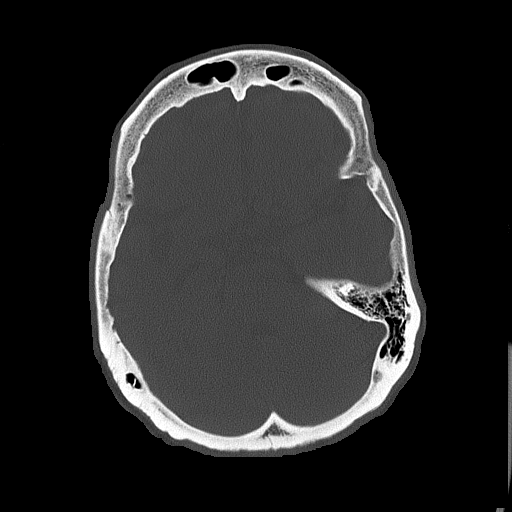
[im 26/72  bone]
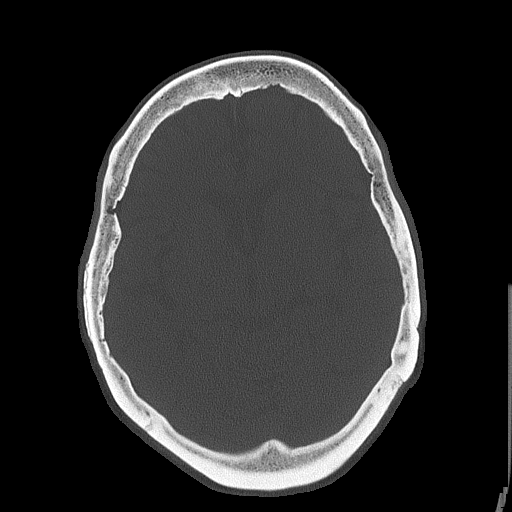

[Series 4: head without · axial · non-contrast · 0.41mm/px · z∈[-58,+17]mm · 4 of 27 slices shown, 5 images (1 of 2)]
[im 6/27  brain]
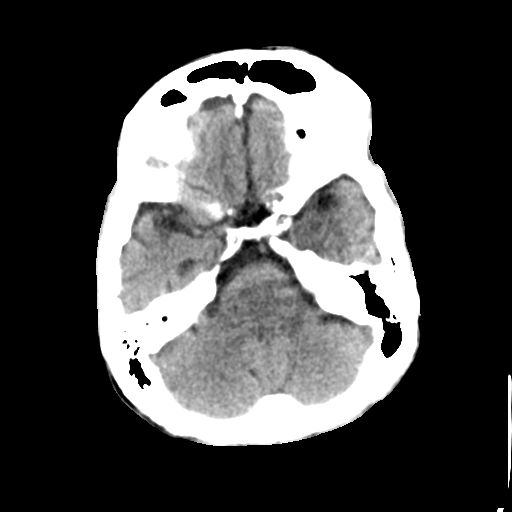
[im 6/27  bone]
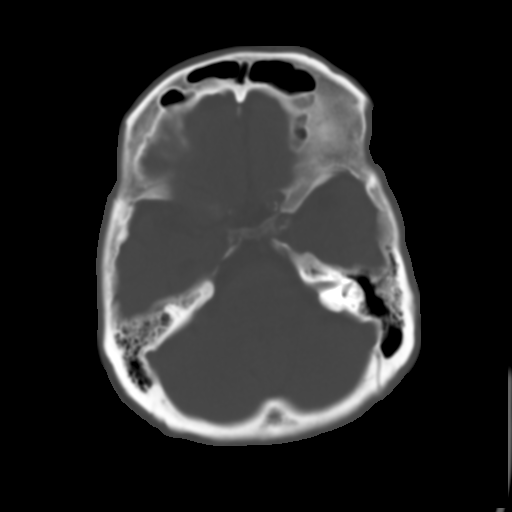
[im 11/27  brain]
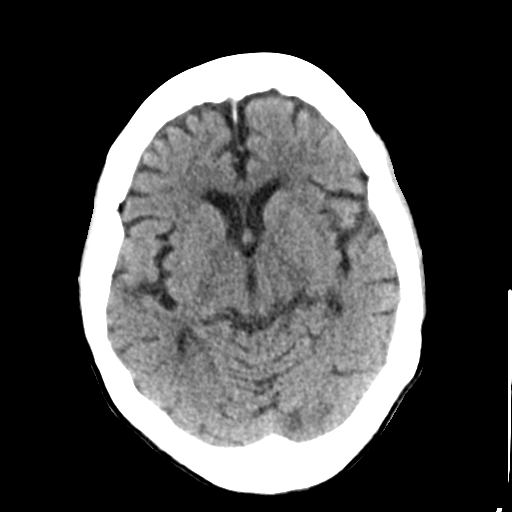
[im 16/27  brain]
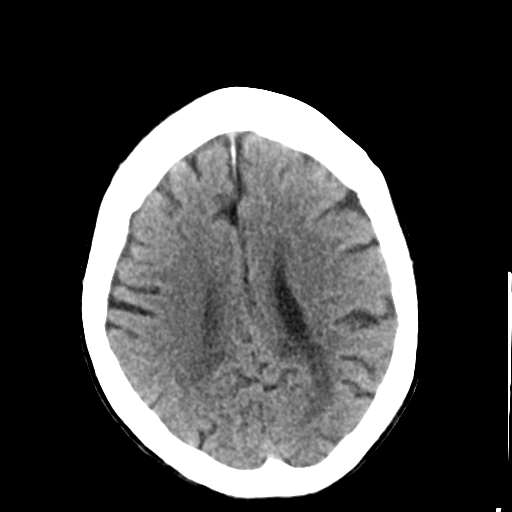
[im 21/27  brain]
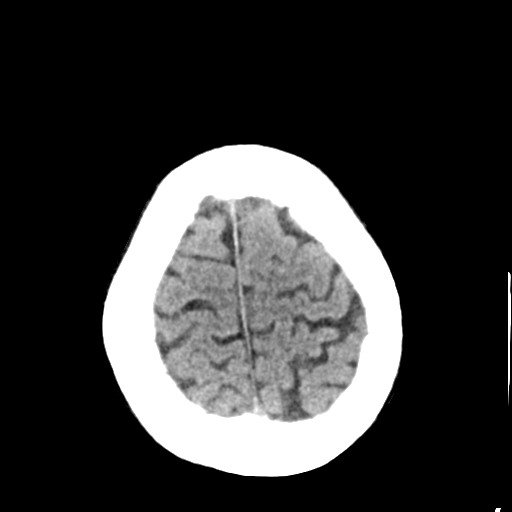

[Series 5: head without cor · coronal · non-contrast · 0.28mm/px · 3 of 67 slices shown]
[im 23/67  brain]
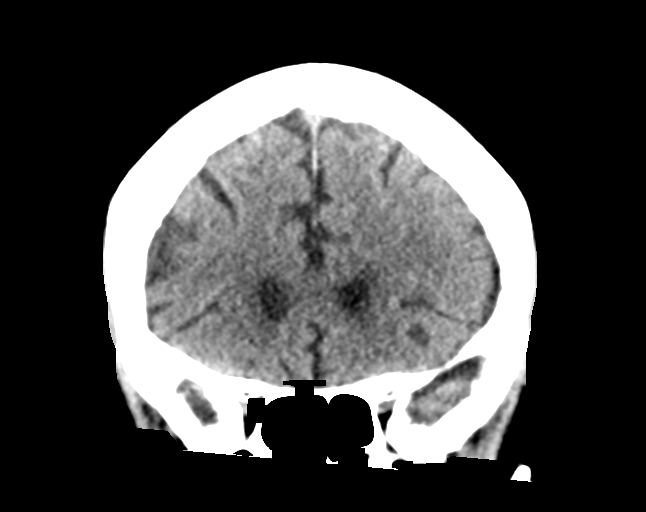
[im 30/67  brain]
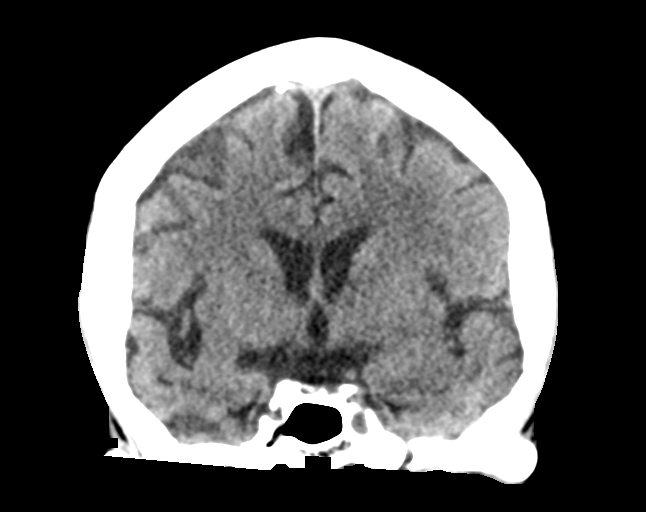
[im 37/67  brain]
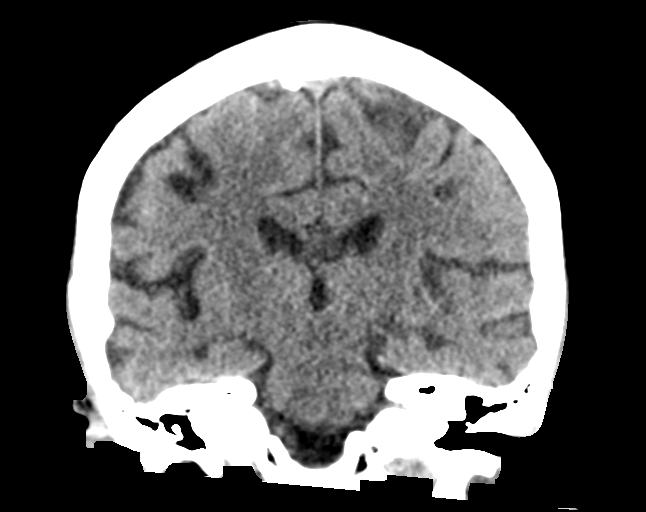

[Series 6: head without sag · sagittal · non-contrast · 0.28mm/px · 3 of 55 slices shown]
[im 19/55  brain]
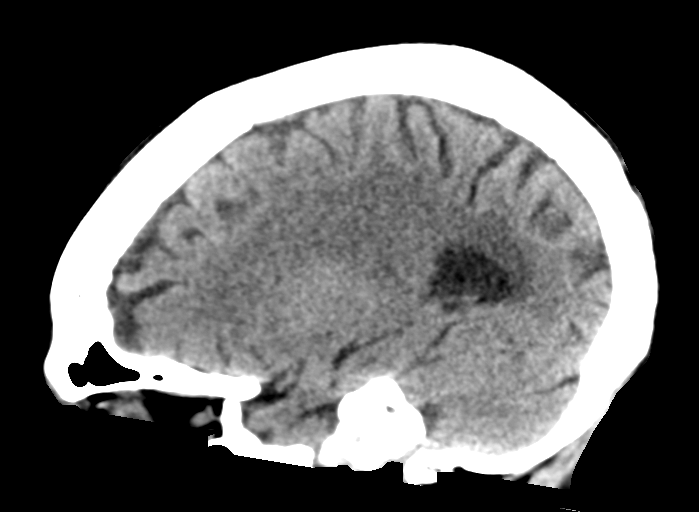
[im 28/55  brain]
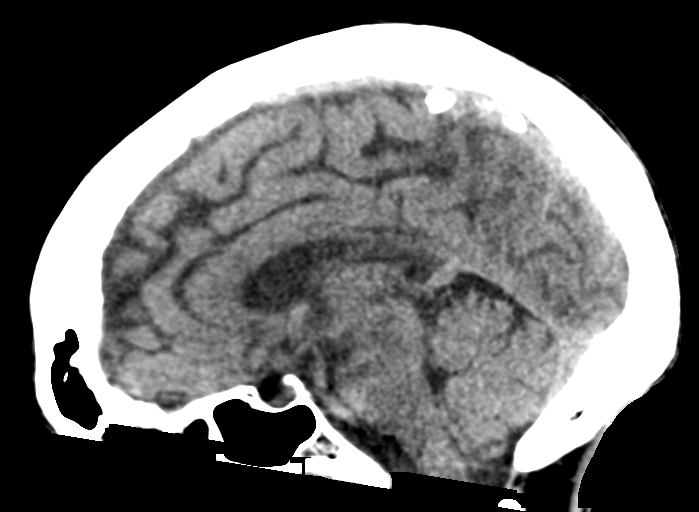
[im 37/55  brain]
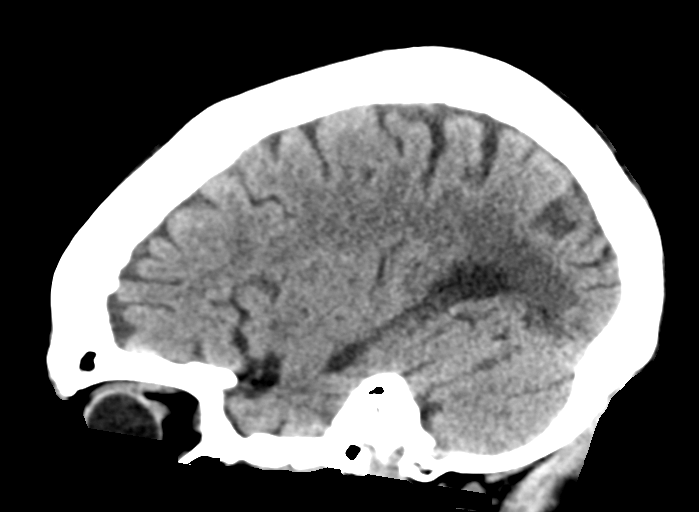

[Series 7: head without · axial · non-contrast · 0.44mm/px · z∈[-57,+18]mm · 4 of 27 slices shown (2 of 2)]
[im 6/27  brain]
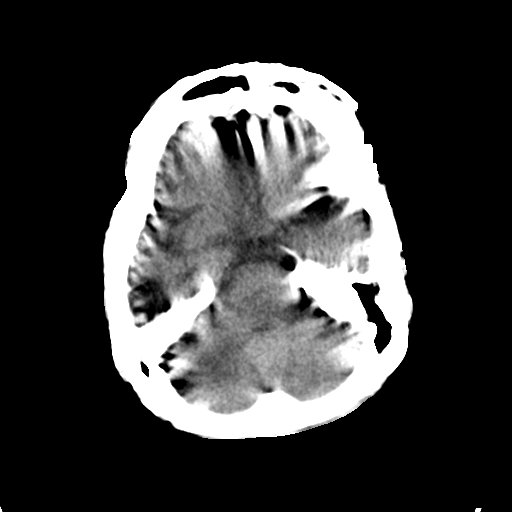
[im 11/27  brain]
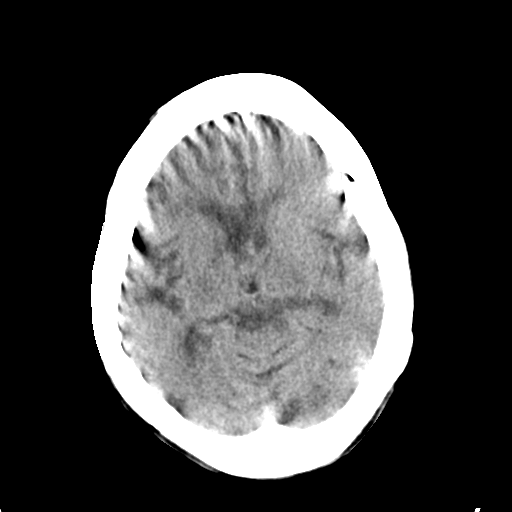
[im 16/27  brain]
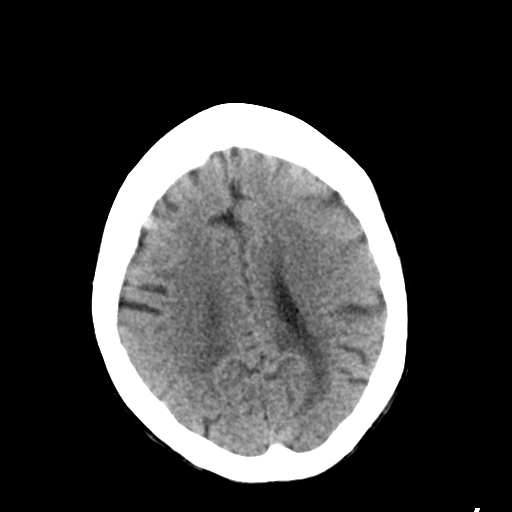
[im 21/27  brain]
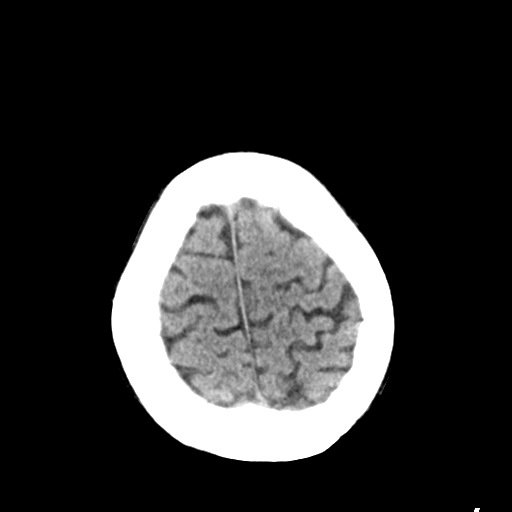

[17 of 47 positions shown; findings below may reference images not displayed]

FINDINGS: Brain: Encephalomalacia changes are noted in the left occipital lobe
medially consistent with prior ischemia. This is stable from the
prior MRI. Mild atrophic changes are noted. Scattered chronic white
matter ischemic changes are seen as well.

Vascular: No hyperdense vessel or unexpected calcification.

Skull: Normal. Negative for fracture or focal lesion.

Sinuses/Orbits: No acute finding.

Other: None.
IMPRESSION: Old left occipital lobe infarct.

Mild atrophic changes and chronic white matter ischemic changes are
seen without acute abnormality.

## 2020-02-01 IMAGING — DX DG CHEST 1V PORT
1 series · 1 of 1 positions shown · non-contrast
Comparison: [DATE]

CLINICAL DATA: Weakness

EXAM:
PORTABLE CHEST 1 VIEW

[chest ap]
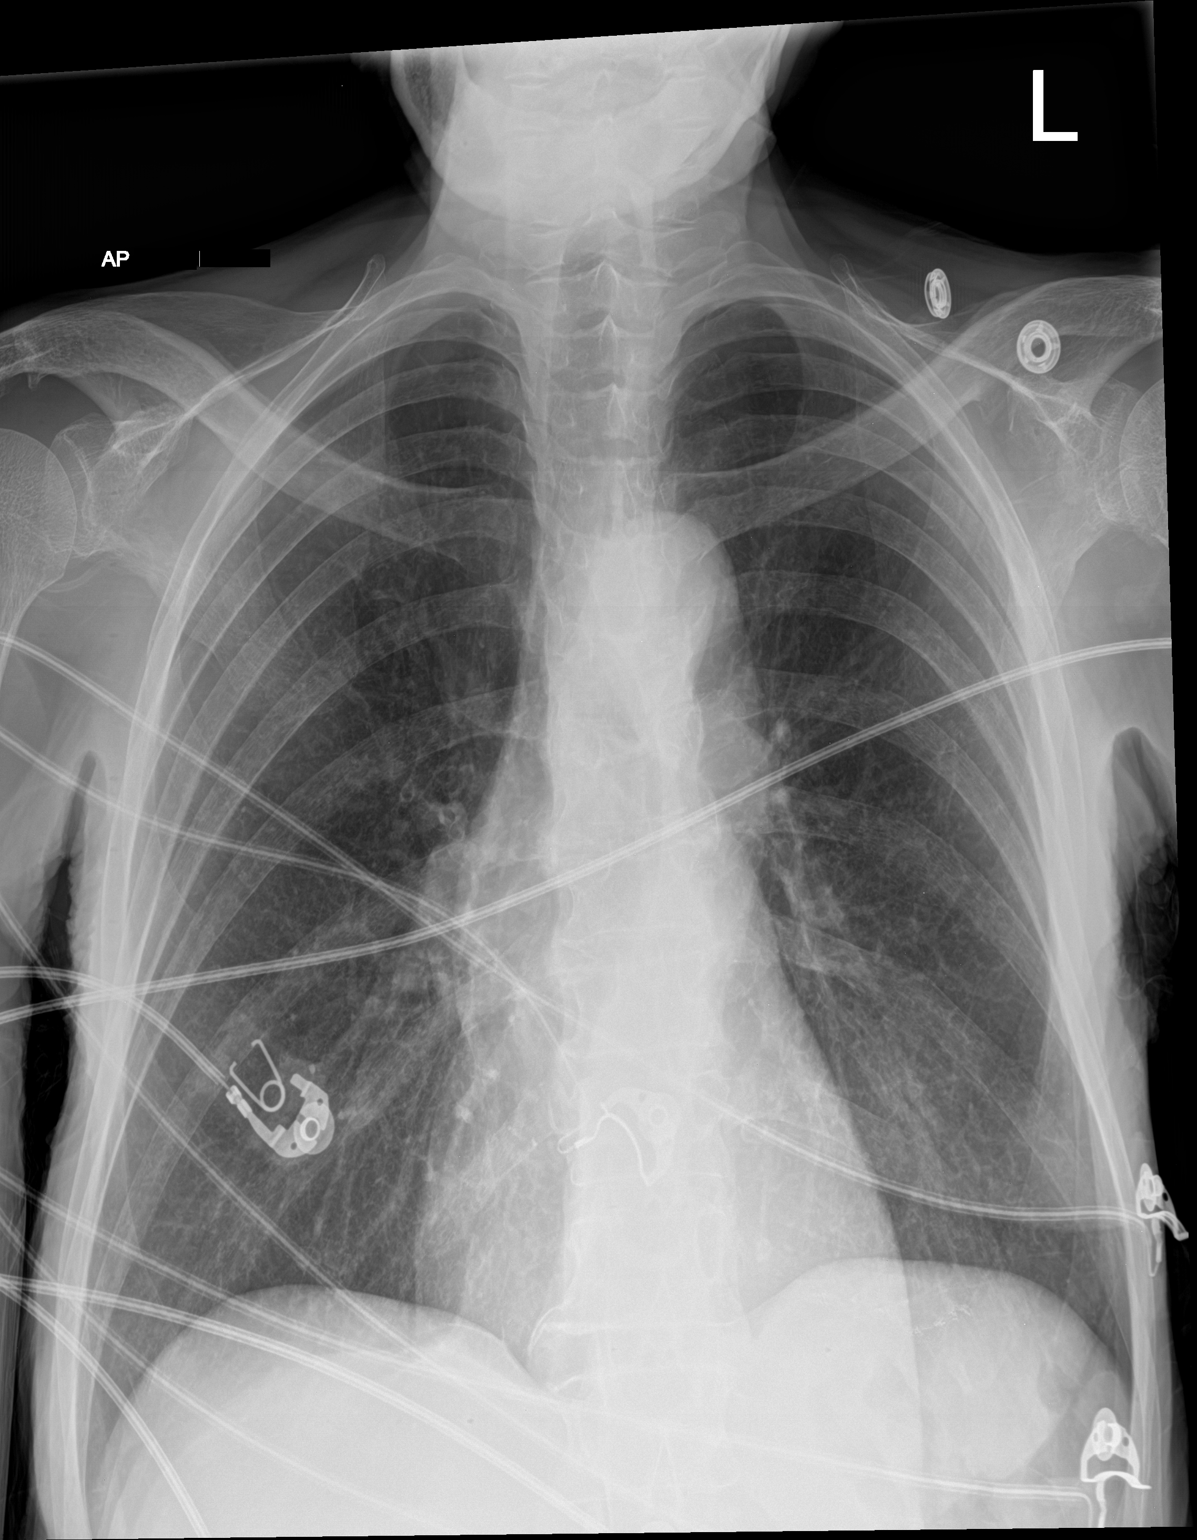

[1 of 1 positions shown; findings below may reference images not displayed]

FINDINGS: The heart size and mediastinal contours are within normal limits.
Both lungs are clear. The visualized skeletal structures are
unremarkable.
IMPRESSION: No active disease.

## 2020-02-01 MED ORDER — SODIUM CHLORIDE 0.9 % IV SOLN: 100.0000 mg | Freq: Every day | INTRAVENOUS | Status: DC

## 2020-02-01 MED ORDER — CALCIUM CARBONATE-VITAMIN D 500-200 MG-UNIT PO TABS
1.0000 | ORAL_TABLET | Freq: Two times a day (BID) | ORAL | Status: DC
  Administered 2020-02-01 – 2020-02-04 (×5): 1 via ORAL
  Filled 2020-02-01 (×7): qty 1

## 2020-02-01 MED ORDER — LORAZEPAM 2 MG/ML IJ SOLN
1.0000 mg | INTRAMUSCULAR | Status: AC | PRN
  Administered 2020-02-02: 4 mg via INTRAVENOUS
  Administered 2020-02-02 (×2): 2 mg via INTRAVENOUS
  Filled 2020-02-01: qty 1
  Filled 2020-02-01: qty 2
  Filled 2020-02-01: qty 1

## 2020-02-01 MED ORDER — KCL IN DEXTROSE-NACL 10-5-0.45 MEQ/L-%-% IV SOLN
INTRAVENOUS | Status: AC
  Filled 2020-02-01 (×2): qty 1000

## 2020-02-01 MED ORDER — ONDANSETRON HCL 4 MG/2ML IJ SOLN: 4.0000 mg | Freq: Four times a day (QID) | INTRAMUSCULAR | Status: DC | PRN

## 2020-02-01 MED ORDER — SODIUM CHLORIDE 0.9 % IV SOLN
200.0000 mg | Freq: Once | INTRAVENOUS | Status: DC
  Filled 2020-02-01: qty 40

## 2020-02-01 MED ORDER — SENNOSIDES-DOCUSATE SODIUM 8.6-50 MG PO TABS: 1.0000 | ORAL_TABLET | Freq: Every evening | ORAL | Status: DC | PRN

## 2020-02-01 MED ORDER — FOLIC ACID 1 MG PO TABS
1.0000 mg | ORAL_TABLET | Freq: Every day | ORAL | Status: DC
  Administered 2020-02-01 – 2020-02-04 (×3): 1 mg via ORAL
  Filled 2020-02-01 (×3): qty 1

## 2020-02-01 MED ORDER — LACTATED RINGERS IV BOLUS
1000.0000 mL | Freq: Once | INTRAVENOUS | Status: AC
  Administered 2020-02-01: 1000 mL via INTRAVENOUS

## 2020-02-01 MED ORDER — ACETAMINOPHEN 650 MG RE SUPP: 650.0000 mg | Freq: Four times a day (QID) | RECTAL | Status: DC | PRN

## 2020-02-01 MED ORDER — THIAMINE HCL 100 MG PO TABS
100.0000 mg | ORAL_TABLET | Freq: Every day | ORAL | Status: DC
  Administered 2020-02-01 – 2020-02-04 (×3): 100 mg via ORAL
  Filled 2020-02-01 (×3): qty 1

## 2020-02-01 MED ORDER — AMOXICILLIN 500 MG PO CAPS
1000.0000 mg | ORAL_CAPSULE | Freq: Two times a day (BID) | ORAL | Status: DC
  Administered 2020-02-01 – 2020-02-04 (×7): 1000 mg via ORAL
  Filled 2020-02-01 (×8): qty 2

## 2020-02-01 MED ORDER — ONDANSETRON HCL 4 MG PO TABS: 4.0000 mg | ORAL_TABLET | Freq: Four times a day (QID) | ORAL | Status: DC | PRN

## 2020-02-01 MED ORDER — ACETAMINOPHEN 325 MG PO TABS: 650.0000 mg | ORAL_TABLET | Freq: Four times a day (QID) | ORAL | Status: DC | PRN

## 2020-02-01 MED ORDER — LORAZEPAM 1 MG PO TABS
1.0000 mg | ORAL_TABLET | ORAL | Status: AC | PRN
  Administered 2020-02-01: 2 mg via ORAL
  Administered 2020-02-01: 1 mg via ORAL
  Filled 2020-02-01: qty 1
  Filled 2020-02-01: qty 2
  Filled 2020-02-01: qty 1

## 2020-02-01 MED ORDER — ENOXAPARIN SODIUM 40 MG/0.4ML ~~LOC~~ SOLN
40.0000 mg | Freq: Every day | SUBCUTANEOUS | Status: DC
  Administered 2020-02-01 – 2020-02-04 (×3): 40 mg via SUBCUTANEOUS
  Filled 2020-02-01 (×3): qty 0.4

## 2020-02-01 MED ORDER — NICOTINE 21 MG/24HR TD PT24
21.0000 mg | MEDICATED_PATCH | Freq: Every day | TRANSDERMAL | Status: DC
  Administered 2020-02-03 – 2020-02-04 (×2): 21 mg via TRANSDERMAL
  Filled 2020-02-01 (×2): qty 1

## 2020-02-01 MED ORDER — PANTOPRAZOLE SODIUM 40 MG PO TBEC
40.0000 mg | DELAYED_RELEASE_TABLET | Freq: Two times a day (BID) | ORAL | Status: DC
  Administered 2020-02-01 – 2020-02-04 (×7): 40 mg via ORAL
  Filled 2020-02-01 (×6): qty 1

## 2020-02-01 MED ORDER — HYDROCODONE-ACETAMINOPHEN 5-325 MG PO TABS: 1.0000 | ORAL_TABLET | ORAL | Status: DC | PRN

## 2020-02-01 MED ORDER — ADULT MULTIVITAMIN W/MINERALS CH
1.0000 | ORAL_TABLET | Freq: Every day | ORAL | Status: DC
  Administered 2020-02-01 – 2020-02-04 (×3): 1 via ORAL
  Filled 2020-02-01 (×2): qty 1

## 2020-02-01 MED ORDER — SODIUM CHLORIDE 0.9% FLUSH
3.0000 mL | Freq: Two times a day (BID) | INTRAVENOUS | Status: DC
  Administered 2020-02-02 – 2020-02-04 (×4): 3 mL via INTRAVENOUS

## 2020-02-01 NOTE — ED Notes (Signed)
Pt to CT scan.

## 2020-02-01 NOTE — ED Notes (Signed)
RN attempted report 

## 2020-02-01 NOTE — Progress Notes (Signed)
Patient bed alarm went off at 2235, RN and NT went to door immediately to start donning their Covid PPE, they both spoke to patient to stay in the bed, she proceeded to jump out of the bed, landed on her knees on the floor mats and jumped up again. RN and NT helped patient to the [facility] and then back into bed, patients bed in lowest position, yellow socks and bractlet were on, the floor mats were down. Patient had no complaints of injury. Dr. Minerva Areola was called, order for posey belly and to give her the extra ativan dose were give, patient didn't want her daughter called. Will continue to monitor.

## 2020-02-01 NOTE — ED Notes (Signed)
Son

## 2020-02-01 NOTE — ED Notes (Signed)
RN walked into pts room and pt climbed over siderail and landed on her feet. Pt was walking around room. MD notified.

## 2020-02-01 NOTE — H&P (Signed)
History and Physical    Tracy Barton IFB:[id number] DOB: 08-29-1945 DOA: 02/01/2020  PCP: [facility]   Patient coming from: Home   Chief Complaint: Syncope, fall   HPI: Tracy Barton is a 75 y.o. female with medical history significant for hypertension, history of alcohol abuse, H. pylori gastritis, and recent admission for perforated gastric ulcer, now presenting to the emergency department with weakness, a fall, and syncope.  Patient reports that she has been generally weak and having difficulty ambulating since hospital discharge on 01/23/2020, fell out of bed last night, and was then seen by EMS to have a syncopal episode after a large loose BM.  Patient denies any chest pain or palpitations.  She denies any shortness of breath.  Reports that she has had a cough for approximately 1 month.  She denies any recent alcohol use, reports that she only has 2 beers a week or less.  She has not noticed any fevers or chills.  ED Course: Upon arrival to the ED, patient is found to be afebrile, saturating well on room ai bradycardic in the 40s, with blood pressure 90/52.  EKG features sinus bradycardia with rate 46.  Chest x-ray negative for acute cardiopulmonary disease.  Head CT negative for acute findings.  Chemistry panel unremarkable and CBC notable for stable normocytic anemia and thrombocytosis.  Patient was given 2 L of LR in the ED.  COVID-19 came back positive.  Review of Systems:  All other systems reviewed and apart from HPI, are negative.  Past Medical History:  Diagnosis Date  . Blood transfusion without reported diagnosis   . Depression   . Neuromuscular disorder [facility])     Past Surgical History:  Procedure Laterality Date  . BRAIN SURGERY    . LAPAROSCOPY N/A 01/10/2020   Procedure: LAPAROSCOPY DIAGNOSTIC laparoscopic washout omental patch splenectomy;  Surgeon: Karie Soda, MD;  Location: [facility];  Service: General;  Laterality: N/A;  . LAPAROTOMY N/A  01/10/2020   Procedure: EXPLORATORY LAPAROTOMY;  Surgeon: Karie Soda, MD;  Location: [facility];  Service: General;  Laterality: N/A;  . TUBAL LIGATION      Social History:   reports that she has been smoking. She has a 26.50 pack-year smoking history. She has never used smokeless tobacco. She reports current alcohol use of about 2.0 standard drinks of alcohol per week. She reports that she does not use drugs.  Allergies  Allergen Reactions  . Nsaids Other (See Comments)    PERFORATED ULCER    Family History  Problem Relation Age of Onset  . Stroke Mother   . Cancer Mother   . Cancer Brother   . Mental illness Maternal Grandmother      Prior to Admission medications   Medication Sig Start Date End Date Taking? Authorizing Provider  amLODipine (NORVASC) 5 MG tablet Take 1 tablet (5 mg total) by mouth daily. 01/24/20   Rama, Maryruth Bun, MD  amoxicillin (AMOXIL) 500 MG capsule Take 2 capsules (1,000 mg total) by mouth every 12 (twelve) hours. 01/23/20   Rama, Maryruth Bun, MD  Calcium-Vit D-Arg-Inos-Silicon (BONE DENSITY) 300-200 MG-UNIT TABS Take 1 tablet by mouth 2 (two) times daily. 12/02/12   Carmelina Dane, MD  clarithromycin (BIAXIN) 500 MG tablet Take 1 tablet (500 mg total) by mouth every 12 (twelve) hours. 01/23/20   Rama, Maryruth Bun, MD  folic acid (FOLVITE) 1 MG tablet Take 1 tablet (1 mg total) by mouth daily. 01/24/20   Rama, Maryruth Bun, MD  lansoprazole (PREVACID) 30 MG capsule Take 1 capsule (30 mg total) by mouth daily at 12 noon. 01/23/20   Rama, Maryruth Bun, MD  lidocaine (LIDODERM) 5 % Place 1 patch onto the skin daily. Remove & Discard patch within 12 hours or as directed by MD 01/24/20   Rama, Maryruth Bun, MD  pantoprazole (PROTONIX) 40 MG tablet Take 1 tablet (40 mg total) by mouth 2 (two) times daily. 01/23/20   Rama, Maryruth Bun, MD  QUEtiapine (SEROQUEL) 25 MG tablet Take 1 tablet (25 mg total) by mouth at bedtime. 01/23/20   Rama, Maryruth Bun, MD  thiamine 100 MG  tablet Take 1 tablet (100 mg total) by mouth daily. 01/24/20   Rama, Maryruth Bun, MD  traMADol (ULTRAM) 50 MG tablet Take 1 tablet (50 mg total) by mouth every 6 (six) hours as needed for moderate pain or severe pain. 01/23/20   Rama, Maryruth Bun, MD    Physical Exam: Vitals:   02/01/20 4268 02/01/20 0334 02/01/20 0513 02/01/20 0540  BP:  98/87 129/74 130/65  Pulse: (!) 46 (!) 46 (!) 57 63  Resp: 18 16 20 16   Temp:      TempSrc:      SpO2: 99% 100% 99% 93%  Weight:      Height:        Constitutional: NAD, calm  Eyes: PERTLA, lids and conjunctivae normal ENMT: Mucous membranes are moist. Posterior pharynx clear of any exudate or lesions.   Neck: normal, supple, no masses, no thyromegaly Respiratory: no wheezing, no crackles. No accessory muscle use.  Cardiovascular: S1 & S2 heard, regular rate and rhythm. No extremity edema.   Abdomen: No distension, no tenderness, soft. Bowel sounds active.  Musculoskeletal: no clubbing / cyanosis. No joint deformity upper and lower extremities.   Skin: no significant rashes, lesions, ulcers. Warm, dry, well-perfused. Neurologic: CN 2-12 grossly intact. Sensation intact. Moving all extremities.  Psychiatric: Alert and oriented to person, place, and situation. Pleasant and cooperative.    Labs and Imaging on Admission: I have personally reviewed following labs and imaging studies  CBC: Recent Labs  Lab 02/01/20 0117  WBC 6.8  NEUTROABS 4.5  HGB 10.0*  HCT 32.2*  MCV 92.8  PLT 524*   Basic Metabolic Panel: Recent Labs  Lab 02/01/20 0117  NA 141  K 3.6  CL 105  CO2 25  GLUCOSE 101*  BUN 16  CREATININE 0.97  CALCIUM 9.1   GFR: Estimated Creatinine Clearance: 37.7 mL/min (by C-G formula based on SCr of 0.97 mg/dL). Liver Function Tests: Recent Labs  Lab 02/01/20 0117  AST 30  ALT 25  ALKPHOS 51  BILITOT 0.4  PROT 5.8*  ALBUMIN 3.0*   No results for input(s): LIPASE, AMYLASE in the last 168 hours. No results for input(s):  AMMONIA in the last 168 hours. Coagulation Profile: Recent Labs  Lab 02/01/20 0117  INR 0.9   Cardiac Enzymes: No results for input(s): CKTOTAL, CKMB, CKMBINDEX, TROPONINI in the last 168 hours. BNP (last 3 results) No results for input(s): PROBNP in the last 8760 hours. HbA1C: No results for input(s): HGBA1C in the last 72 hours. CBG: No results for input(s): GLUCAP in the last 168 hours. Lipid Profile: No results for input(s): CHOL, HDL, LDLCALC, TRIG, CHOLHDL, LDLDIRECT in the last 72 hours. Thyroid Function Tests: No results for input(s): TSH, T4TOTAL, FREET4, T3FREE, THYROIDAB in the last 72 hours. Anemia Panel: No results for input(s): VITAMINB12, FOLATE, FERRITIN, TIBC, IRON, RETICCTPCT in the last 72  hours. Urine analysis:    Component Value Date/Time   COLORURINE STRAW (A) 02/01/2020 0545   APPEARANCEUR CLEAR 02/01/2020 0545   LABSPEC 1.006 02/01/2020 0545   PHURINE 6.0 02/01/2020 0545   GLUCOSEU NEGATIVE 02/01/2020 0545   HGBUR NEGATIVE 02/01/2020 0545   BILIRUBINUR NEGATIVE 02/01/2020 0545   BILIRUBINUR neg 03/24/2013 1409   KETONESUR NEGATIVE 02/01/2020 0545   PROTEINUR NEGATIVE 02/01/2020 0545   UROBILINOGEN 0.2 03/24/2013 1409   UROBILINOGEN 0.2 09/24/2008 1045   NITRITE NEGATIVE 02/01/2020 0545   LEUKOCYTESUR NEGATIVE 02/01/2020 0545   Sepsis Labs: @LABRCNTIP (procalcitonin:4,lacticidven:4) ) Recent Results (from the past 240 hour(s))  SARS CORONAVIRUS 2 (TAT 6-24 HRS) Nasopharyngeal Nasopharyngeal Swab     Status: Abnormal   Collection Time: 02/01/20  1:18 AM   Specimen: Nasopharyngeal Swab  Result Value Ref Range Status   SARS Coronavirus 2 POSITIVE (A) NEGATIVE Final    Comment: (NOTE) SARS-CoV-2 target nucleic acids are DETECTED.  The SARS-CoV-2 RNA is generally detectable in upper and lower respiratory specimens during the acute phase of infection. Positive results are indicative of the presence of SARS-CoV-2 RNA. Clinical correlation with  patient history and other diagnostic information is  necessary to determine patient infection status. Positive results do not rule out bacterial infection or co-infection with other viruses.  The expected result is Negative.  Fact Sheet for Patients: 02/03/20  Fact Sheet for Healthcare Providers: HairSlick.no  This test is not yet approved or cleared by the [place] FDA and  has been authorized for detection and/or diagnosis of SARS-CoV-2 by FDA under an Emergency Use Authorization (EUA). This EUA will remain  in effect (meaning this test can be used) for the duration of the COVID-19 declaration under Section 564(b)(1) of the Act, 21 U. S.C. section 360bbb-3(b)(1), unless the authorization is terminated or revoked sooner.   Performed at [facility], [address] [place], [place]      Radiological Exams on Admission: CT Head Wo Contrast  Result Date: 02/01/2020 CLINICAL DATA:  Altered mental status EXAM: CT HEAD WITHOUT CONTRAST TECHNIQUE: Contiguous axial images were obtained from the base of the skull through the vertex without intravenous contrast. COMPARISON:  MRI from 01/20/2020 FINDINGS: Brain: Encephalomalacia changes are noted in the left occipital lobe medially consistent with prior ischemia. This is stable from the prior MRI. Mild atrophic changes are noted. Scattered chronic white matter ischemic changes are seen as well. Vascular: No hyperdense vessel or unexpected calcification. Skull: Normal. Negative for fracture or focal lesion. Sinuses/Orbits: No acute finding. Other: None. IMPRESSION: Old left occipital lobe infarct. Mild atrophic changes and chronic white matter ischemic changes are seen without acute abnormality. Electronically Signed   By: 01/22/2020 M.D.   On: 02/01/2020 01:42   DG Chest Port 1 View  Result Date: 02/01/2020 CLINICAL DATA:  Weakness EXAM: PORTABLE CHEST 1 VIEW  COMPARISON:  03/08/2019 FINDINGS: The heart size and mediastinal contours are within normal limits. Both lungs are clear. The visualized skeletal structures are unremarkable. IMPRESSION: No active disease. Electronically Signed   By: 05/08/2019 M.D.   On: 02/01/2020 01:38    EKG: Independently reviewed. Sinus bradycardia, rate 46.   Assessment/Plan   1. Syncope  - Presents after a syncopal episode that occurred after a large BM  - She is found to have sinus bradycardia on EKG with normal QT and no blocks; SBP dropped 23 mmHg on standing in ED  - She denies any chest pain or palpitations  -  She was fluid resuscitated in ED - Continue cardiac monitoring, repeat orthostatics after IVF    2. Sinus bradycardia  - HR in 40s in ED, sinus bradycarida on EKG  - She had a vasovagal event pta but has been several hours now and HR still 40s on monitor  - Hold clarithromycin, continue cardiac monitoring    3. PUD  - Admitted recently with perforated gastric ulcer s/p omental Graham patch on 01/10/20, had positive H pylori and started two wks of PPI, clarithromycin, and amoxicillin  - She has completed 12 or 13 days, plan to hold clarithromycin in light of bradycardia, continue amox and PPI BID   4. COVID-19  - COVID is incidentally positive  - She is at high risk for developing severe disease but hesitant to start remdesivir now with HR in 40s  - Continue isolation, monitor    5. History of alcohol abuse  - Patient has hx of alcohol abuse, reports only ~2 beers/wk now  - Continue her folate and thiamine supplements   6. Anemia; thrombocytosis  - Hgb 10.0 and platelets 524k on admission  - Appears stable, no overt bleeding   7. Hypertension  - SBP 90 in ED, will hold Norvasc for now    8. Deconditioning, fall  - Patient reports general weakness since hospital discharge on 01/23/2020, has had difficulty ambulating, and fell out of bed last night  - No acute findings on head CT  - Consult PT     DVT prophylaxis: Lovenox  Code Status: Full  Level of Care: Level of care: Telemetry Cardiac Family Communication: Daughter Johann Capers updated by phone  Disposition Plan:  Patient is from: Home  Anticipated d/c is to: TBD Anticipated d/c date is: 02/02/20 Patient currently: Pending cardiac monitoring, PT eval  Consults called: None  Admission status: Observation     Briscoe Deutscher, MD [facility]  02/01/2020, 6:41 AM

## 2020-02-01 NOTE — Progress Notes (Signed)
PROGRESS NOTE    Tracy Barton  YWV:[id number] DOB: 1945-11-26 DOA: 02/01/2020 [facility]CP: [facility]   DAY ZERO NOTE  Please see admission note this morning for more information.  Admitted for acute episode of syncope with bowel movement - likely vasovagal in nature although notable bradycardia at admission. Improving with supportive care and IV fluids. Increasing PO intake over the course of the morning. Continue supportive care and monitoring. PT to follow for discharge recommendations.   Objective: Vitals:   02/01/20 1211 02/01/20 1230 02/01/20 1350 02/01/20 1441  BP: 111/63 112/83 111/72 137/83  Pulse: (!) 56 (!) 58 63 69  Resp: 20 13 (!) 26   Temp: 98.5 F (36.9 C)     TempSrc: Oral     SpO2: 100% 100% 100%   Weight:      Height:       No intake or output data in the 24 hours ending 02/01/20 1532 Filed Weights   02/01/20 0059  Weight: 47.6 kg    Examination:  General exam: Appears calm and comfortable  Respiratory system: Clear to auscultation. Respiratory effort normal. Cardiovascular system: S1 & S2 heard, RRR. No JVD, murmurs, rubs, gallops or clicks. No pedal edema. Gastrointestinal system: Abdomen is nondistended, soft and nontender. No organomegaly or masses felt. Normal bowel sounds heard. Central nervous system: Alert and oriented. No focal neurological deficits. Extremities: Symmetric 5 x 5 power. Skin: No rashes, lesions or ulcers Psychiatry: Judgement and insight appear normal. Mood & affect appropriate.     Data Reviewed: I have personally reviewed following labs and imaging studies  CBC: Recent Labs  Lab 02/01/20 0117  WBC 6.8  NEUTROABS 4.5  HGB 10.0*  HCT 32.2*  MCV 92.8  PLT 524*   Basic Metabolic Panel: Recent Labs  Lab 02/01/20 0117  NA 141  K 3.6  CL 105  CO2 25  GLUCOSE 101*  BUN 16  CREATININE 0.97  CALCIUM 9.1   GFR: Estimated Creatinine Clearance: 37.7 mL/min (by C-G formula based on SCr of 0.97  mg/dL). Liver Function Tests: Recent Labs  Lab 02/01/20 0117  AST 30  ALT 25  ALKPHOS 51  BILITOT 0.4  PROT 5.8*  ALBUMIN 3.0*   No results for input(s): LIPASE, AMYLASE in the last 168 hours. No results for input(s): AMMONIA in the last 168 hours. Coagulation Profile: Recent Labs  Lab 02/01/20 0117  INR 0.9   Cardiac Enzymes: No results for input(s): CKTOTAL, CKMB, CKMBINDEX, TROPONINI in the last 168 hours. BNP (last 3 results) No results for input(s): PROBNP in the last 8760 hours. HbA1C: No results for input(s): HGBA1C in the last 72 hours. CBG: No results for input(s): GLUCAP in the last 168 hours. Lipid Profile: No results for input(s): CHOL, HDL, LDLCALC, TRIG, CHOLHDL, LDLDIRECT in the last 72 hours. Thyroid Function Tests: No results for input(s): TSH, T4TOTAL, FREET4, T3FREE, THYROIDAB in the last 72 hours. Anemia Panel: No results for input(s): VITAMINB12, FOLATE, FERRITIN, TIBC, IRON, RETICCTPCT in the last 72 hours. Sepsis Labs: No results for input(s): PROCALCITON, LATICACIDVEN in the last 168 hours.  Recent Results (from the past 240 hour(s))  SARS CORONAVIRUS 2 (TAT 6-24 HRS) Nasopharyngeal Nasopharyngeal Swab     Status: Abnormal   Collection Time: 02/01/20  1:18 AM   Specimen: Nasopharyngeal Swab  Result Value Ref Range Status   SARS Coronavirus 2 POSITIVE (A) NEGATIVE Final    Comment: (NOTE) SARS-CoV-2 target nucleic acids are DETECTED.  The SARS-CoV-2 RNA is generally detectable  in upper and lower respiratory specimens during the acute phase of infection. Positive results are indicative of the presence of SARS-CoV-2 RNA. Clinical correlation with patient history and other diagnostic information is  necessary to determine patient infection status. Positive results do not rule out bacterial infection or co-infection with other viruses.  The expected result is Negative.  Fact Sheet for Patients: HairSlick.no  Fact  Sheet for Healthcare Providers: quierodirigir.com  This test is not yet approved or cleared by the [place] FDA and  has been authorized for detection and/or diagnosis of SARS-CoV-2 by FDA under an Emergency Use Authorization (EUA). This EUA will remain  in effect (meaning this test can be used) for the duration of the COVID-19 declaration under Section 564(b)(1) of the Act, 21 U. S.C. section 360bbb-3(b)(1), unless the authorization is terminated or revoked sooner.   Performed at [facility], [address], [place], [place] [address]          Radiology Studies: CT Head Wo Contrast  Result Date: 02/01/2020 CLINICAL DATA:  Altered mental status EXAM: CT HEAD WITHOUT CONTRAST TECHNIQUE: Contiguous axial images were obtained from the base of the skull through the vertex without intravenous contrast. COMPARISON:  MRI from 01/20/2020 FINDINGS: Brain: Encephalomalacia changes are noted in the left occipital lobe medially consistent with prior ischemia. This is stable from the prior MRI. Mild atrophic changes are noted. Scattered chronic white matter ischemic changes are seen as well. Vascular: No hyperdense vessel or unexpected calcification. Skull: Normal. Negative for fracture or focal lesion. Sinuses/Orbits: No acute finding. Other: None. IMPRESSION: Old left occipital lobe infarct. Mild atrophic changes and chronic white matter ischemic changes are seen without acute abnormality. Electronically Signed   By: Alcide Clever M.D.   On: 02/01/2020 01:42   DG Chest Port 1 View  Result Date: 02/01/2020 CLINICAL DATA:  Weakness EXAM: PORTABLE CHEST 1 VIEW COMPARISON:  03/08/2019 FINDINGS: The heart size and mediastinal contours are within normal limits. Both lungs are clear. The visualized skeletal structures are unremarkable. IMPRESSION: No active disease. Electronically Signed   By: Alcide Clever M.D.   On: 02/01/2020 01:38    Scheduled Meds: . amoxicillin   1,000 mg Oral Q12H  . calcium-vitamin D  1 tablet Oral BID WC  . enoxaparin (LOVENOX) injection  40 mg Subcutaneous Daily  . folic acid  1 mg Oral Daily  . pantoprazole  40 mg Oral BID  . sodium chloride flush  3 mL Intravenous Q12H  . thiamine  100 mg Oral Daily   Continuous Infusions: . dextrose 5 % and 0.45 % NaCl with KCl 10 mEq/L 75 mL/hr at 02/01/20 1435     LOS: 0 days    Time spent:  Azucena Fallen, DO [facility]  If 7PM-7AM, please contact night-coverage [email or web address]  02/01/2020, 3:32 PM

## 2020-02-01 NOTE — Evaluation (Signed)
Physical Therapy Evaluation Patient Details Name: Tracy Barton MRN: [id number] DOB: June 26, 1945 Today's Date: 02/01/2020   History of Present Illness  Pt is a 75 y.o. F with significant PMH of HTN, alcohol use, H. pylori gastritis, and recent admission for perforated gastric ulcer, who presents with weakness, sinus bradycardia, a fall and syncope. COVID-19 came back positive.  Clinical Impression  Pt presents with above. Ambulating room distances (x40 feet) with no assistive device and supervision. SpO2 99% on RA, HR 50 bpm, BP 106/60. Pt displays generalized weakness, mild balance deficits, and cognitive impairments. Pt oriented to self only and could not provide home living set up or PLOF. Called pt daughters, who provided information and stated that is pt cognitive baseline. Pt daughter reported pt son plans to go stay with pt  In her second floor apartment following discharge. See below for recommendations.     Follow Up Recommendations Home health PT;Supervision/Assistance - 24 hour    Equipment Recommendations  None recommended by PT    Recommendations for Other Services       Precautions / Restrictions Precautions Precautions: Fall Restrictions Weight Bearing Restrictions: No      Mobility  Bed Mobility Overal bed mobility: Modified Independent                  Transfers Overall transfer level: Needs assistance Equipment used: None Transfers: Sit to/from Stand Sit to Stand: Supervision         General transfer comment: Supervision for safety  Ambulation/Gait Ambulation/Gait assistance: Supervision Gait Distance (Feet): 40 Feet Assistive device: None Gait Pattern/deviations: Step-through pattern;Decreased stride length Gait velocity: decreased   General Gait Details: Supervision for safety, no overt LOB  Stairs            Wheelchair Mobility    Modified Rankin (Stroke Patients Only)       Balance Overall balance assessment: Needs  assistance Sitting-balance support: Feet supported Sitting balance-Leahy Scale: Good     Standing balance support: No upper extremity supported Standing balance-Leahy Scale: Good                               Pertinent Vitals/Pain Pain Assessment: No/denies pain    Home Living Family/patient expects to be discharged to:: Private residence Living Arrangements: Children (son) Available Help at Discharge: Family;Available PRN/intermittently Type of Home: Apartment Home Access: Stairs to enter   Entrance Stairs-Number of Steps:  (flight) Home Layout: One level        Prior Function Level of Independence: Needs assistance   Gait / Transfers Assistance Needed: Independent, history of recent fall  ADL's / Homemaking Assistance Needed: Assist for IADL's        Hand Dominance        Extremity/Trunk Assessment   Upper Extremity Assessment Upper Extremity Assessment: Overall WFL for tasks assessed    Lower Extremity Assessment Lower Extremity Assessment: Generalized weakness       Communication   Communication: No difficulties  Cognition Arousal/Alertness: Awake/alert Behavior During Therapy: WFL for tasks assessed/performed Overall Cognitive Status: History of cognitive impairments - at baseline                                 General Comments: Pt daughter reporting ongoing cognitive impairments. Pt pleasant, able to be easily redirected and following all commands. Poor historian and unable to provide home set up  or PLOF (confirmed with pt daughter)      General Comments      Exercises     Assessment/Plan    PT Assessment Patient needs continued PT services  PT Problem List Decreased strength;Decreased balance;Decreased mobility       PT Treatment Interventions Therapeutic activities;Cognitive remediation;Gait training;Therapeutic exercise;Patient/family education;Functional mobility training;Balance training    PT Goals  (Current goals can be found in the Care Plan section)  Acute Rehab PT Goals Patient Stated Goal: "eat candy." PT Goal Formulation: With patient Time For Goal Achievement: 02/15/20 Potential to Achieve Goals: Good    Frequency Min 3X/week   Barriers to discharge        Co-evaluation               AM-PAC PT "6 Clicks" Mobility  Outcome Measure Help needed turning from your back to your side while in a flat bed without using bedrails?: None Help needed moving from lying on your back to sitting on the side of a flat bed without using bedrails?: None Help needed moving to and from a bed to a chair (including a wheelchair)?: None Help needed standing up from a chair using your arms (e.g., wheelchair or bedside chair)?: None Help needed to walk in hospital room?: None Help needed climbing 3-5 steps with a railing? : A Little 6 Click Score: 23    End of Session   Activity Tolerance: Patient tolerated treatment well Patient left: in bed;with call bell/phone within reach Nurse Communication: Mobility status PT Visit Diagnosis: Unsteadiness on feet (R26.81);Difficulty in walking, not elsewhere classified (R26.2)    Time: 3428-7681 PT Time Calculation (min) (ACUTE ONLY): 16 min   Charges:   PT Evaluation $PT Eval Moderate Complexity: 1 Mod          Lillia Pauls, PT, DPT Acute Rehabilitation Services Pager [phone] Office [phone]   Norval Morton 02/01/2020, 11:52 AM

## 2020-02-01 NOTE — ED Triage Notes (Signed)
Pt bib [facility] from home. Pt is staying with family since being discharged approximately a week ago after having a ruptured gastric ulcer.  Pt fell out of bed earlier today, denies injury but has had increasing weakness since then. EMS reports pt had large diarrhea BM on their arrival and had a syncopal episode. Pt's HR remains in 40s.  Family also reports that pt has been eating and drinking different substances since being discharged, pt drank 12 ensures in a row, drank a bottle of milk of magnesia and a bottle of pepto-bismol. Pt A&Ox3, is not oriented to time.

## 2020-02-01 NOTE — ED Notes (Signed)
Daughter-- Delano Metz-- [phone]
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
## 2020-02-01 NOTE — ED Provider Notes (Signed)
[facility] EMERGENCY DEPARTMENT Provider Note   CSN: [id number] Arrival date & time: 02/01/20  0049     History Chief Complaint  Patient presents with  . Fall    Tracy Barton is a 75 y.o. female.  The history is provided by the patient.  Fall This is a new problem. The problem occurs constantly. The problem has not changed since onset.Pertinent negatives include no abdominal pain and no headaches. Nothing aggravates the symptoms. Nothing relieves the symptoms.  Patient with history of depression, alcohol abuse, recent perforated gastric ulcer presents after falling out of bed at home.  Patient apparently fell out of bed at home and was lethargic and confused.  Patient also had a large loose bowel movement that was nonbloody per EMS.  EMS also reports patient was bradycardic.  Patient denies any complaints at this time other than feeling fatigued.  She denies any pain complaints.     Past Medical History:  Diagnosis Date  . Blood transfusion without reported diagnosis   . Depression   . Neuromuscular disorder [facility])     Patient Active Problem List   Diagnosis Date Noted  . H pylori ulcer 01/23/2020  . Acute metabolic encephalopathy 01/23/2020  . Chronic alcohol abuse 01/23/2020  . Hypokalemia 01/23/2020  . Hypophosphatemia 01/23/2020  . Acute blood loss anemia 01/23/2020  . Essential hypertension 01/23/2020  . Protein-calorie malnutrition, severe 01/15/2020  . Perforated prepyloric gastric ulcer s/p omental Cheree Ditto patch 01/10/2020 01/10/2020  . History of medication noncompliance 01/10/2020  . ARF (acute renal failure) (HCC) 01/10/2020  . 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 Hep C w/o coma, chronic (HCC) 09/12/2014  . Head injury 09/11/2014  . Back injury 04/15/2014  . Smoker 03/24/2013  . Gastritis and gastroduodenitis 03/24/2013  . Loss of weight 03/24/2013  . Unspecified vitamin D deficiency 03/24/2013  . Trigeminal neuralgia  06/01/2011    Past Surgical History:  Procedure Laterality Date  . BRAIN SURGERY    . LAPAROSCOPY N/A 01/10/2020   Procedure: LAPAROSCOPY DIAGNOSTIC laparoscopic washout omental patch splenectomy;  Surgeon: Karie Soda, MD;  Location: [facility] ORS;  Service: General;  Laterality: N/A;  . LAPAROTOMY N/A 01/10/2020   Procedure: EXPLORATORY LAPAROTOMY;  Surgeon: Karie Soda, MD;  Location: [facility] ORS;  Service: General;  Laterality: N/A;  . TUBAL LIGATION       OB History   No obstetric history on file.     Family History  Problem Relation Age of Onset  . Stroke Mother   . Cancer Mother   . Cancer Brother   . Mental illness Maternal Grandmother     Social History   Tobacco Use  . Smoking status: Current Some Day Smoker    Packs/day: 0.50    Years: 53.00    Pack years: 26.50  . Smokeless tobacco: Never Used  Vaping Use  . Vaping Use: Never used  Substance Use Topics  . Alcohol use: Yes    Alcohol/week: 2.0 standard drinks    Types: 2 Cans of beer per week  . Drug use: No    Home Medications Prior to Admission medications   Medication Sig Start Date End Date Taking? Authorizing Provider  amLODipine (NORVASC) 5 MG tablet Take 1 tablet (5 mg total) by mouth daily. 01/24/20   Rama, Maryruth Bun, MD  amoxicillin (AMOXIL) 500 MG capsule Take 2 capsules (1,000 mg total) by mouth every 12 (twelve) hours. 01/23/20   Rama, Maryruth Bun, MD  Calcium-Vit D-Arg-Inos-Silicon (BONE DENSITY) 300-200  MG-UNIT TABS Take 1 tablet by mouth 2 (two) times daily. 12/02/12   Carmelina Dane, MD  clarithromycin (BIAXIN) 500 MG tablet Take 1 tablet (500 mg total) by mouth every 12 (twelve) hours. 01/23/20   Rama, Maryruth Bun, MD  folic acid (FOLVITE) 1 MG tablet Take 1 tablet (1 mg total) by mouth daily. 01/24/20   Rama, Maryruth Bun, MD  lansoprazole (PREVACID) 30 MG capsule Take 1 capsule (30 mg total) by mouth daily at 12 noon. 01/23/20   Rama, Maryruth Bun, MD  lidocaine (LIDODERM) 5 % Place 1 patch onto  the skin daily. Remove & Discard patch within 12 hours or as directed by MD 01/24/20   Rama, Maryruth Bun, MD  pantoprazole (PROTONIX) 40 MG tablet Take 1 tablet (40 mg total) by mouth 2 (two) times daily. 01/23/20   Rama, Maryruth Bun, MD  QUEtiapine (SEROQUEL) 25 MG tablet Take 1 tablet (25 mg total) by mouth at bedtime. 01/23/20   Rama, Maryruth Bun, MD  thiamine 100 MG tablet Take 1 tablet (100 mg total) by mouth daily. 01/24/20   Rama, Maryruth Bun, MD  traMADol (ULTRAM) 50 MG tablet Take 1 tablet (50 mg total) by mouth every 6 (six) hours as needed for moderate pain or severe pain. 01/23/20   Rama, Maryruth Bun, MD    Allergies    Nsaids  Review of Systems   Review of Systems  Constitutional: Positive for fatigue. Negative for fever.  Gastrointestinal: Positive for diarrhea. Negative for abdominal pain.  Neurological: Negative for headaches.  All other systems reviewed and are negative.   Physical Exam Updated Vital Signs BP 110/65 (BP Location: Right Arm)   Pulse (!) 49   Temp (!) 97.4 F (36.3 C) (Oral)   Resp 16   Ht 1.613 m (5' 3.5")   Wt 47.6 kg   SpO2 97%   BMI 18.31 kg/m   Physical Exam  CONSTITUTIONAL: Elderly, frail, cachectic HEAD: Normocephalic/atraumatic, no tenderness EYES: EOMI/PERRL ENMT: Mucous membranes dry NECK: supple no meningeal signs SPINE/BACK:entire spine nontender, no bruising/crepitance/stepoffs noted to spine CV: S1/S2 noted, no murmurs/rubs/gallops noted LUNGS: Lungs are clear to auscultation bilaterally, no apparent distress ABDOMEN: soft, mild diffuse tenderness.  Bowel sounds noted.  Healed incisions noted GU:no cva tenderness NEURO: Pt is awake/alert/appropriate, moves all extremitiesx4.  No facial droop.  GCS 15 EXTREMITIES: pulses normal/equal, full ROM, pelvis stable, center joint SKIN: warm, color normal PSYCH: no abnormalities of mood noted, alert and oriented to situation  ED Results / Procedures / Treatments   Labs (all labs ordered  are listed, but only abnormal results are displayed) Labs Reviewed  SARS CORONAVIRUS 2 (TAT 6-24 HRS) - Abnormal; Notable for the following components:      Result Value   SARS Coronavirus 2 POSITIVE (*)    All other components within normal limits  CBC WITH DIFFERENTIAL/PLATELET - Abnormal; Notable for the following components:   RBC 3.47 (*)    Hemoglobin 10.0 (*)    HCT 32.2 (*)    Platelets 524 (*)    All other components within normal limits  COMPREHENSIVE METABOLIC PANEL - Abnormal; Notable for the following components:   Glucose, Bld 101 (*)    Total Protein 5.8 (*)    Albumin 3.0 (*)    All other components within normal limits  SALICYLATE LEVEL - Abnormal; Notable for the following components:   Salicylate Lvl <7.0 (*)    All other components within normal limits  PROTIME-INR  ETHANOL  ACETAMINOPHEN  LEVEL  URINALYSIS, ROUTINE W REFLEX MICROSCOPIC  RAPID URINE DRUG SCREEN, HOSP PERFORMED    EKG EKG Interpretation  Date/Time:  Sunday February 01 2020 00:54:17 EST Ventricular Rate:  46 PR Interval:    QRS Duration: 87 QT Interval:  512 QTC Calculation: 448 R Axis:   56 Text Interpretation: Sinus bradycardia Confirmed by Zadie Rhine ([id number]) on 02/01/2020 1:15:02 AM   Radiology CT Head Wo Contrast  Result Date: 02/01/2020 CLINICAL DATA:  Altered mental status EXAM: CT HEAD WITHOUT CONTRAST TECHNIQUE: Contiguous axial images were obtained from the base of the skull through the vertex without intravenous contrast. COMPARISON:  MRI from 01/20/2020 FINDINGS: Brain: Encephalomalacia changes are noted in the left occipital lobe medially consistent with prior ischemia. This is stable from the prior MRI. Mild atrophic changes are noted. Scattered chronic white matter ischemic changes are seen as well. Vascular: No hyperdense vessel or unexpected calcification. Skull: Normal. Negative for fracture or focal lesion. Sinuses/Orbits: No acute finding. Other: None. IMPRESSION: Old  left occipital lobe infarct. Mild atrophic changes and chronic white matter ischemic changes are seen without acute abnormality. Electronically Signed   By: Alcide Clever M.D.   On: 02/01/2020 01:42   DG Chest Port 1 View  Result Date: 02/01/2020 CLINICAL DATA:  Weakness EXAM: PORTABLE CHEST 1 VIEW COMPARISON:  03/08/2019 FINDINGS: The heart size and mediastinal contours are within normal limits. Both lungs are clear. The visualized skeletal structures are unremarkable. IMPRESSION: No active disease. Electronically Signed   By: Alcide Clever M.D.   On: 02/01/2020 01:38    Procedures Procedures   Medications Ordered in ED Medications  lactated ringers bolus 1,000 mL (0 mLs Intravenous Stopped 02/01/20 0545)  lactated ringers bolus 1,000 mL (0 mLs Intravenous Stopped 02/01/20 0439)    ED Course  I have reviewed the triage vital signs and the nursing notes.  Pertinent labs & imaging results that were available during my care of the patient were reviewed by me and considered in my medical decision making (see chart for details).    MDM Rules/Calculators/A&P                          1:26 AM Patient presents from home after reportedly falling.  Patient underwent repair of a perforated gastric ulcer earlier this month.  She has been staying with her son and daughter-in-law over the past week.  I spoke to them via phone.  They report the patient has been intentionally trying to harm herself and make herself sick.  They report that she has been drinking large amounts of Ensure, milk of magnesia and took Pepto-Bismol several days ago.  Daughter-in-law reports she was planning on taking out involuntary commitment on the.  It also reported that after she fell out of bed she was unconscious and confused for some time.  She is now awake and alert.  Will obtain CT head, labs and reassess 3:12 AM Imaging is negative.  Labs thus far reassuring.  Patient is requesting something to eat 5:58 AM Patient found  to be positive for COVID-19. After receiving 2 L of fluid, nursing staff reports patient was unable to ambulate.  Her blood pressure dropped from 130 to about 100 upon standing.  She is also had episodes of hypotension in emergency department.  Per recent hospital stay, patient had been ambulating prior to discharge. Will consult hospitalist for admission 6:09 AM Discussed with Dr. Antionette Char for admission. Patient will be admitted for  rehydration and monitoring.  Of note, patient has been confused and not answering all questions appropriately.  However this has been documented on previous admission and suspected patient has had dementia and also previous brain infarcts Final Clinical Impression(s) / ED Diagnoses Final diagnoses:  COVID-19  Orthostatic hypotension  Syncope and collapse    Rx / DC Orders ED Discharge Orders    None       Zadie Rhine, MD 02/01/20 [phone]

## 2020-02-02 DIAGNOSIS — Y92003 Bedroom of unspecified non-institutional (private) residence as the place of occurrence of the external cause: Secondary | ICD-10-CM | POA: Diagnosis not present

## 2020-02-02 DIAGNOSIS — Z9181 History of falling: Secondary | ICD-10-CM | POA: Diagnosis not present

## 2020-02-02 DIAGNOSIS — F32A Depression, unspecified: Secondary | ICD-10-CM | POA: Diagnosis present

## 2020-02-02 DIAGNOSIS — I10 Essential (primary) hypertension: Secondary | ICD-10-CM | POA: Diagnosis present

## 2020-02-02 DIAGNOSIS — R55 Syncope and collapse: Secondary | ICD-10-CM | POA: Diagnosis not present

## 2020-02-02 DIAGNOSIS — W06XXXA Fall from bed, initial encounter: Secondary | ICD-10-CM | POA: Diagnosis present

## 2020-02-02 DIAGNOSIS — Z79899 Other long term (current) drug therapy: Secondary | ICD-10-CM | POA: Diagnosis not present

## 2020-02-02 DIAGNOSIS — D75839 Thrombocytosis, unspecified: Secondary | ICD-10-CM | POA: Diagnosis present

## 2020-02-02 DIAGNOSIS — Z781 Physical restraint status: Secondary | ICD-10-CM | POA: Diagnosis not present

## 2020-02-02 DIAGNOSIS — Z20822 Contact with and (suspected) exposure to covid-19: Secondary | ICD-10-CM | POA: Diagnosis present

## 2020-02-02 DIAGNOSIS — I951 Orthostatic hypotension: Secondary | ICD-10-CM | POA: Diagnosis present

## 2020-02-02 DIAGNOSIS — F10139 Alcohol abuse with withdrawal, unspecified: Secondary | ICD-10-CM | POA: Diagnosis present

## 2020-02-02 DIAGNOSIS — D649 Anemia, unspecified: Secondary | ICD-10-CM | POA: Diagnosis present

## 2020-02-02 DIAGNOSIS — Z681 Body mass index (BMI) 19 or less, adult: Secondary | ICD-10-CM | POA: Diagnosis not present

## 2020-02-02 DIAGNOSIS — K219 Gastro-esophageal reflux disease without esophagitis: Secondary | ICD-10-CM | POA: Diagnosis present

## 2020-02-02 DIAGNOSIS — G9341 Metabolic encephalopathy: Secondary | ICD-10-CM | POA: Diagnosis not present

## 2020-02-02 DIAGNOSIS — Z823 Family history of stroke: Secondary | ICD-10-CM | POA: Diagnosis not present

## 2020-02-02 DIAGNOSIS — E43 Unspecified severe protein-calorie malnutrition: Secondary | ICD-10-CM | POA: Diagnosis present

## 2020-02-02 DIAGNOSIS — F1010 Alcohol abuse, uncomplicated: Secondary | ICD-10-CM | POA: Diagnosis not present

## 2020-02-02 DIAGNOSIS — R634 Abnormal weight loss: Secondary | ICD-10-CM | POA: Diagnosis present

## 2020-02-02 DIAGNOSIS — Y900 Blood alcohol level of less than 20 mg/100 ml: Secondary | ICD-10-CM | POA: Diagnosis present

## 2020-02-02 DIAGNOSIS — F0390 Unspecified dementia without behavioral disturbance: Secondary | ICD-10-CM | POA: Diagnosis present

## 2020-02-02 DIAGNOSIS — R5383 Other fatigue: Secondary | ICD-10-CM | POA: Diagnosis present

## 2020-02-02 DIAGNOSIS — K251 Acute gastric ulcer with perforation: Secondary | ICD-10-CM

## 2020-02-02 DIAGNOSIS — U071 COVID-19: Secondary | ICD-10-CM | POA: Diagnosis present

## 2020-02-02 DIAGNOSIS — Z886 Allergy status to analgesic agent status: Secondary | ICD-10-CM | POA: Diagnosis not present

## 2020-02-02 DIAGNOSIS — G928 Other toxic encephalopathy: Secondary | ICD-10-CM | POA: Diagnosis present

## 2020-02-02 LAB — BASIC METABOLIC PANEL
Anion gap: 8 (ref 5–15)
BUN: 14 mg/dL (ref 8–23)
CO2: 25 mmol/L (ref 22–32)
Calcium: 9 mg/dL (ref 8.9–10.3)
Chloride: 107 mmol/L (ref 98–111)
Creatinine, Ser: 0.95 mg/dL (ref 0.44–1.00)
GFR, Estimated: >60 mL/min (ref >60)
Glucose, Bld: 108 mg/dL — ABNORMAL HIGH (ref 70–99)
Potassium: 3.7 mmol/L (ref 3.5–5.1)
Sodium: 140 mmol/L (ref 135–145)

## 2020-02-02 LAB — CBC
HCT: 28.6 % — ABNORMAL LOW (ref 36.0–46.0)
Hemoglobin: 9.3 g/dL — ABNORMAL LOW (ref 12.0–15.0)
MCH: 29.2 pg (ref 26.0–34.0)
MCHC: 32.5 g/dL (ref 30.0–36.0)
MCV: 89.9 fL (ref 80.0–100.0)
Platelets: 454 10*3/uL — ABNORMAL HIGH (ref 150–400)
RBC: 3.18 MIL/uL — ABNORMAL LOW (ref 3.87–5.11)
RDW: 13.4 % (ref 11.5–15.5)
WBC: 5.6 10*3/uL (ref 4.0–10.5)
nRBC: 0 % (ref 0.0–0.2)

## 2020-02-02 LAB — MAGNESIUM: Magnesium: 1.6 mg/dL — ABNORMAL LOW (ref 1.7–2.4)

## 2020-02-02 MED ORDER — HALOPERIDOL LACTATE 5 MG/ML IJ SOLN
2.5000 mg | Freq: Once | INTRAMUSCULAR | Status: AC
  Administered 2020-02-02: 2.5 mg via INTRAVENOUS

## 2020-02-02 MED ORDER — HALOPERIDOL LACTATE 5 MG/ML IJ SOLN
2.5000 mg | Freq: Four times a day (QID) | INTRAMUSCULAR | Status: DC | PRN
  Administered 2020-02-02: 2.5 mg via INTRAVENOUS

## 2020-02-02 MED ORDER — HALOPERIDOL LACTATE 5 MG/ML IJ SOLN
INTRAMUSCULAR | Status: AC
  Filled 2020-02-02: qty 1

## 2020-02-02 MED ORDER — ENSURE ENLIVE PO LIQD
237.0000 mL | Freq: Two times a day (BID) | ORAL | Status: DC
  Administered 2020-02-04 (×2): 237 mL via ORAL

## 2020-02-02 MED ORDER — MAGNESIUM SULFATE 2 GM/50ML IV SOLN: 2.0000 g | Freq: Once | INTRAVENOUS | Status: DC

## 2020-02-02 NOTE — Progress Notes (Signed)
HOSPITAL MEDICINE OVERNIGHT EVENT NOTE    Seeing that patient continues to be agitated, attempting to get out of bed and not following commands.  Patient is pulling at hardware and impeding medical care.  Patient is seemingly less combative before however.  We will transition restraint order to nonviolent.  Patient is very slightly improved compared to last evening/this morning and hopefully patient can be transitioned off her restraints in the next 24 hours.  Marinda Elk  MD [facility]

## 2020-02-02 NOTE — Plan of Care (Signed)
  Problem: Safety: Goal: Non-violent Restraint(s) Outcome: Progressing   Problem: Safety: Goal: Violent Restraint(s) Outcome: Progressing   Problem: Safety: Goal: Violent Restraint(s) Outcome: Progressing

## 2020-02-02 NOTE — Progress Notes (Signed)
HOSPITAL MEDICINE OVERNIGHT EVENT NOTE    Notified by nursing the patient is exhibiting severe agitation throughout the evening shift.  Patient was admitted to our service on 1/30 for syncope and fall.  Patient has recently been discharged after being hospitalized for perforated gastric ulcer from 1/8 until 1/21.  Of note, patient has a known history of alcohol abuse.  Hospital course during last hospitalization was complicated by encephalopathy and alcohol withdrawal symptoms at that time.   During this hospitalization patient has exhibited increasing confusion and agitation.  Daytime provider yesterday afternoon initiated CIWA protocol.  Since then, patient has exhibited increasingly worsening agitation and confusion with bouts of combativeness attempting to strike staff on multiple occasions.  Patient has been pulling at tubes and lines and has been frequently trying to get out of bed, not responding to redirection.  Patient is already received several doses of both oral and intravenous Ativan without improvement.  I have advised giving an additional dose of 4 mg of intravenous Ativan.  Shortly thereafter I came to evaluate and examine the patient at the bedside.  Patient is continuing to exhibit significant confusion and agitation.  Patient is additionally disoriented.  Patient is moving all 4 extremities spontaneously however but does not consistently follow commands.  Heart is exhibiting a irregularly irregular rhythm.  Lungs are essentially clear.  Abdomen is soft and nontender.  Chart reviewed, CT imaging of the brain during this hospitalization reveals no acute disease.  Urinalysis unremarkable.  Chest x-ray reveals no evidence of acute cardiopulmonary process.  Chemistry reveals no significant electrolyte abnormalities.  Therefore, most likely cause of patient's extreme agitation is alcohol withdrawal.  Since Ativan has been unsuccessful we will attempt to provide patient with intravenous  Haldol in an effort to reduce patient's degree of combativeness and agitation.  Patient also unfortunately need to temporarily be placed in four-point soft restraints.    Continue to monitor patient closely.    Marinda Elk  MD [facility]

## 2020-02-02 NOTE — Progress Notes (Signed)
Initial Nutrition Assessment  RD working remotely.  DOCUMENTATION CODES:   Underweight, suspect severe malnutrition but unable to confirm without NFPE  INTERVENTION:   - Ensure Enlive po BID, each supplement provides 350 kcal and 20 grams of protein  - Continue MVI with minerals daily  NUTRITION DIAGNOSIS:   Increased nutrient needs related to acute illness (COVID-19 infection) as evidenced by estimated needs.  GOAL:   Patient will meet greater than or equal to 90% of their needs  MONITOR:   PO intake,Supplement acceptance,Labs,Weight trends  REASON FOR ASSESSMENT:   Other (underweight BMI)    ASSESSMENT:   75 year old female who presented to the ED on 1/30 after a fall and syncopal episode. PMH of HTN, EtOH abuse, H pylori gastritis, PUD, and recent admission for perforated gastric ulcer. Pt tested positive for COVID-19.   Reviewed RD notes from previous admission. At that time, pt met criteria for severe malnutrition. Suspect malnutrition persists but unable to confirm without NFPE.  Unable to reach pt via phone call to room. Per review of notes, pt with confusion and severe agitation throughout the evening shift. Per MD, most likely cause of pt's extreme agitation is alcohol withdrawal.  No meal completions documented since admission.  Reviewed weight history. Last available weight prior to his month is from 2016. Current weight of 48 kg slightly up compared to weight from 01/15/20 encounter (45.8 kg).  Medications reviewed and include: Oscal with D, folic acid, MVI with minerals, protonix, thiamine, IV magnesium sulfate 2 grams once  Labs reviewed: magnesium 1.6, hemoglobin 9.3   NUTRITION - FOCUSED PHYSICAL EXAM:  Unable to complete at this time. RD working remotely.  Diet Order:   Diet Order            Diet Heart Room service appropriate? Yes; Fluid consistency: Thin  Diet effective now                 EDUCATION NEEDS:   No education needs have been  identified at this time  Skin:  Skin Assessment: Reviewed RN Assessment (skin tear left wrist)  Last BM:  02/01/20  Height:   Ht Readings from Last 1 Encounters:  02/01/20 5' 3.5" (1.613 m)    Weight:   Wt Readings from Last 1 Encounters:  02/02/20 48 kg    BMI:  Body mass index is 18.45 kg/m.  Estimated Nutritional Needs:   Kcal:  1500-1700  Protein:  75-90 grams  Fluid:  1.5-1.7 L    Gustavus Bryant, MS, RD, LDN Inpatient Clinical Dietitian Please see [facility] for contact information.

## 2020-02-02 NOTE — Progress Notes (Signed)
PROGRESS NOTE    [name]  VEL:[id number] DOB: February 20, 1945 DOA: 02/01/2020 PCP: [facility]   Brief Narrative:  CORRINNE Barton is a 75 y.o. female with medical history significant for hypertension, history of alcohol abuse, H. pylori gastritis, and recent admission for perforated gastric ulcer, now presenting to the emergency department with weakness, a fall, and syncope.  Patient reports that she has been generally weak and having difficulty ambulating since hospital discharge on 01/23/2020, fell out of bed last night, and was then seen by EMS to have a syncopal episode after a large loose BM.  Patient denies any chest pain or palpitations.  She denies any shortness of breath.  Reports that she has had a cough for approximately 1 month.  She denies any recent alcohol use, reports that she only has 2 beers a week or less.  She has not noticed any fevers or chills. Notably in the ED: blood pressure 90/52.  EKG features sinus bradycardia with rate 46.  Chest x-ray negative for acute cardiopulmonary disease.  Head CT negative for acute findings.  Chemistry panel unremarkable and CBC notable for stable normocytic anemia and thrombocytosis.  Patient was given 2 L of LR in the ED.  COVID-19 came back positive.  Assessment & Plan:   Principal Problem:   Syncope Active Problems:   Gastritis and gastroduodenitis   Perforated prepyloric gastric ulcer s/p omental Cheree Ditto patch 01/10/2020   Chronic alcohol abuse   Essential hypertension   COVID-19   Sinus bradycardia on ECG   Acute metabolic/toxic encephalopathy in the setting of alcohol abuse and subsequent withdrawal Cannot rule out hospital delirium - Increasing CIWA protocols initiated overnight with minimal improvement in confusion - Continue restraints until patient's mental status improves -Would consider increased doses of benzos over the next 24 hours given acutely worsening symptoms  Vasovagal syncope, resolving  -  Presents after a syncopal episode that occurred after a large BM  - Currently without further episodes of hypotension; despite bradycardia at rest - Continue cardiac monitoring, repeat orthostatics after IVF    Sinus bradycardia, ongoing  - HR in 40s in ED, sinus bradycarida on EKG  - She had a vasovagal event pta but has been several hours now and HR still 40s on monitor  - Hold clarithromycin, continue cardiac monitoring    Peptic ulcer disease/GERD  - Admitted recently with perforated gastric ulcer s/p omental Graham patch on 01/10/20, had positive H pylori and started two wks of PPI, clarithromycin, and amoxicillin   Incidentally positive COVID-19  - She is at high risk for developing severe disease but hesitant to start remdesivir now with HR in 40s  - Continue isolation, monitor    Anemia; thrombocytosis  - Stable, follow morning labs   Hypertension  -Currently holding any rate control medications, blood pressure well controlled today  Acute dilatory dysfunction in the setting of chronic deconditioning, elevated fall risk - Patient reports general weakness since hospital discharge on 01/23/2020, has had difficulty ambulating -PT OT evaluation ongoing  DVT prophylaxis: Lovenox  Code Status: Full  Family Communication: Left voicemail for daughter Cherly Hensen, no answer.  Status is: Inpatient  Dispo: The patient is from: Home              Anticipated d/c is to: To be determined              Anticipated d/c date is: 24 to 48 hours  Patient currently not medically stable for discharge  Consultants:   None  Procedures:   None  Antimicrobials:  Amoxicillin  Subjective: Overnight patient much more confused somewhat combative, continues to attempt to get out of bed, pull IVs, telemetry, for safety patient replaced in restraints overnight, will continue this morning until patient's mental status improves.  Objective: Vitals:   02/01/20 2300 02/02/20  0259 02/02/20 0500 02/02/20 0652  BP: (!) 130/105  (!) 129/100   Pulse:  68 70 68  Resp:      Temp: 98.5 F (36.9 C)  (!) 97.5 F (36.4 C)   TempSrc: Oral  Axillary   SpO2:   92% 98%  Weight:   48 kg   Height:       No intake or output data in the 24 hours ending 02/02/20 0723 Filed Weights   02/01/20 0059 02/02/20 0500  Weight: 47.6 kg 48 kg    Examination:  General: Cachectic appearing elderly female resting in bed, disoriented, unable to cooperate with interview or follow simple commands.  Currently in soft wrist restraints HEENT:  Normocephalic atraumatic.  Sclerae nonicteric, noninjected.  Extraocular movements intact bilaterally. Neck:  Without mass or deformity.  Trachea is midline. Lungs:  Clear to auscultate bilaterally without rhonchi, wheeze, or rales. Heart:  Regular rate and rhythm.  Without murmurs, rubs, or gallops. Abdomen:  Soft, nontender, nondistended.  Without guarding or rebound. Extremities: Without cyanosis, clubbing, edema, or obvious deformity. Vascular:  Dorsalis pedis and posterior tibial pulses palpable bilaterally. Skin:  Warm and dry, no erythema, no ulcerations.  Data Reviewed: I have personally reviewed following labs and imaging studies  CBC: Recent Labs  Lab 02/01/20 0117 02/02/20 0058  WBC 6.8 5.6  NEUTROABS 4.5  --   HGB 10.0* 9.3*  HCT 32.2* 28.6*  MCV 92.8 89.9  PLT 524* 454*   Basic Metabolic Panel: Recent Labs  Lab 02/01/20 0117 02/02/20 0058  NA 141 140  K 3.6 3.7  CL 105 107  CO2 25 25  GLUCOSE 101* 108*  BUN 16 14  CREATININE 0.97 0.95  CALCIUM 9.1 9.0  MG  --  1.6*   GFR: Estimated Creatinine Clearance: 38.8 mL/min (by C-G formula based on SCr of 0.95 mg/dL). Liver Function Tests: Recent Labs  Lab 02/01/20 0117  AST 30  ALT 25  ALKPHOS 51  BILITOT 0.4  PROT 5.8*  ALBUMIN 3.0*   No results for input(s): LIPASE, AMYLASE in the last 168 hours. No results for input(s): AMMONIA in the last 168  hours. Coagulation Profile: Recent Labs  Lab 02/01/20 0117  INR 0.9   Cardiac Enzymes: No results for input(s): CKTOTAL, CKMB, CKMBINDEX, TROPONINI in the last 168 hours. BNP (last 3 results) No results for input(s): PROBNP in the last 8760 hours. HbA1C: No results for input(s): HGBA1C in the last 72 hours. CBG: No results for input(s): GLUCAP in the last 168 hours. Lipid Profile: No results for input(s): CHOL, HDL, LDLCALC, TRIG, CHOLHDL, LDLDIRECT in the last 72 hours. Thyroid Function Tests: No results for input(s): TSH, T4TOTAL, FREET4, T3FREE, THYROIDAB in the last 72 hours. Anemia Panel: No results for input(s): VITAMINB12, FOLATE, FERRITIN, TIBC, IRON, RETICCTPCT in the last 72 hours. Sepsis Labs: No results for input(s): PROCALCITON, LATICACIDVEN in the last 168 hours.  Recent Results (from the past 240 hour(s))  SARS CORONAVIRUS 2 (TAT 6-24 HRS) Nasopharyngeal Nasopharyngeal Swab     Status: Abnormal   Collection Time: 02/01/20  1:18 AM   Specimen: Nasopharyngeal  Swab  Result Value Ref Range Status   SARS Coronavirus 2 POSITIVE (A) NEGATIVE Final    Comment: (NOTE) SARS-CoV-2 target nucleic acids are DETECTED.  The SARS-CoV-2 RNA is generally detectable in upper and lower respiratory specimens during the acute phase of infection. Positive results are indicative of the presence of SARS-CoV-2 RNA. Clinical correlation with patient history and other diagnostic information is  necessary to determine patient infection status. Positive results do not rule out bacterial infection or co-infection with other viruses.  The expected result is Negative.  Fact Sheet for Patients: HairSlick.no  Fact Sheet for Healthcare Providers: quierodirigir.com  This test is not yet approved or cleared by the [place] FDA and  has been authorized for detection and/or diagnosis of SARS-CoV-2 by FDA under an Emergency Use  Authorization (EUA). This EUA will remain  in effect (meaning this test can be used) for the duration of the COVID-19 declaration under Section 564(b)(1) of the Act, 21 U. S.C. section 360bbb-3(b)(1), unless the authorization is terminated or revoked sooner.   Performed at [facility], [address], [place], [place] [address]   MRSA PCR Screening     Status: None   Collection Time: 02/01/20  9:08 PM   Specimen: Nasopharyngeal  Result Value Ref Range Status   MRSA by PCR NEGATIVE NEGATIVE Final    Comment:        The GeneXpert MRSA Assay (FDA approved for NASAL specimens only), is one component of a comprehensive MRSA colonization surveillance program. It is not intended to diagnose MRSA infection nor to guide or monitor treatment for MRSA infections. Performed at [facility], [address], [place], [place] [address]          Radiology Studies: CT Head Wo Contrast  Result Date: 02/01/2020 CLINICAL DATA:  Altered mental status EXAM: CT HEAD WITHOUT CONTRAST TECHNIQUE: Contiguous axial images were obtained from the base of the skull through the vertex without intravenous contrast. COMPARISON:  MRI from 01/20/2020 FINDINGS: Brain: Encephalomalacia changes are noted in the left occipital lobe medially consistent with prior ischemia. This is stable from the prior MRI. Mild atrophic changes are noted. Scattered chronic white matter ischemic changes are seen as well. Vascular: No hyperdense vessel or unexpected calcification. Skull: Normal. Negative for fracture or focal lesion. Sinuses/Orbits: No acute finding. Other: None. IMPRESSION: Old left occipital lobe infarct. Mild atrophic changes and chronic white matter ischemic changes are seen without acute abnormality. Electronically Signed   By: Alcide Clever M.D.   On: 02/01/2020 01:42   DG Chest Port 1 View  Result Date: 02/01/2020 CLINICAL DATA:  Weakness EXAM: PORTABLE CHEST 1 VIEW COMPARISON:  03/08/2019 FINDINGS:  The heart size and mediastinal contours are within normal limits. Both lungs are clear. The visualized skeletal structures are unremarkable. IMPRESSION: No active disease. Electronically Signed   By: Alcide Clever M.D.   On: 02/01/2020 01:38   Scheduled Meds: . amoxicillin  1,000 mg Oral Q12H  . calcium-vitamin D  1 tablet Oral BID WC  . enoxaparin (LOVENOX) injection  40 mg Subcutaneous Daily  . folic acid  1 mg Oral Daily  . haloperidol lactate      . multivitamin with minerals  1 tablet Oral Daily  . nicotine  21 mg Transdermal Daily  . pantoprazole  40 mg Oral BID  . sodium chloride flush  3 mL Intravenous Q12H  . thiamine  100 mg Oral Daily   Continuous Infusions: . magnesium sulfate bolus  IVPB       LOS: 0 days    Time spent:  Azucena Fallen, DO [facility]  If 7PM-7AM, please contact night-coverage [email or web address]  02/02/2020, 7:23 AM

## 2020-02-02 NOTE — Plan of Care (Signed)
  Problem: Safety: Goal: Non-violent Restraint(s) Outcome: Not Progressing  Continue to monitor patient

## 2020-02-02 NOTE — Plan of Care (Signed)
  Problem: Safety: Goal: Non-violent Restraint(s) 02/02/2020 0638 by Milas Hock, RN Outcome: Not Progressing 02/02/2020 0128 by Milas Hock, RN Outcome: Not Progressing   Problem: Safety: Goal: Violent Restraint(s) Outcome: Not Progressing   Problem: Safety: Goal: Violent Restraint(s) Outcome: Not Progressing

## 2020-02-03 LAB — CBC
HCT: 31.9 % — ABNORMAL LOW (ref 36.0–46.0)
Hemoglobin: 10.2 g/dL — ABNORMAL LOW (ref 12.0–15.0)
MCH: 28.8 pg (ref 26.0–34.0)
MCHC: 32 g/dL (ref 30.0–36.0)
MCV: 90.1 fL (ref 80.0–100.0)
Platelets: 502 10*3/uL — ABNORMAL HIGH (ref 150–400)
RBC: 3.54 MIL/uL — ABNORMAL LOW (ref 3.87–5.11)
RDW: 13.1 % (ref 11.5–15.5)
WBC: 5.6 10*3/uL (ref 4.0–10.5)
nRBC: 0 % (ref 0.0–0.2)

## 2020-02-03 LAB — GLUCOSE, CAPILLARY: Glucose-Capillary: 73 mg/dL (ref 70–99)

## 2020-02-03 LAB — BASIC METABOLIC PANEL
Anion gap: 9 (ref 5–15)
BUN: 10 mg/dL (ref 8–23)
CO2: 27 mmol/L (ref 22–32)
Calcium: 9.4 mg/dL (ref 8.9–10.3)
Chloride: 105 mmol/L (ref 98–111)
Creatinine, Ser: 0.68 mg/dL (ref 0.44–1.00)
GFR, Estimated: >60 mL/min (ref >60)
Glucose, Bld: 84 mg/dL (ref 70–99)
Potassium: 3.4 mmol/L — ABNORMAL LOW (ref 3.5–5.1)
Sodium: 141 mmol/L (ref 135–145)

## 2020-02-03 NOTE — Progress Notes (Signed)
Physical Therapy Treatment Patient Details Name: Tracy Barton MRN: [id number] DOB: Apr 12, 1945 Today's Date: 02/03/2020    History of Present Illness Pt is a 75 y.o. F with significant PMH of HTN, alcohol use, H. pylori gastritis, and recent admission for perforated gastric ulcer, who presents with weakness, sinus bradycardia, a fall and syncope. Pt did have increased confusion and required restraints - suspected ETOH w/d.  Now off restraints.  COVID-19 came back positive.    PT Comments    Pt making gradual progress; however, spoke with RN who reports pt will not have assist at d/c.  Reports 1 dtr has COVID, 1 dtr works, and son unable to take pt home.  Pt additionally has a flight of steps to enter her home.  She required min A during therapy to prevent falls.  Due to decreased support, steps, and fall risk - updated recommendation to SNF. If pt able to obtain 24 hr support - could do home with HHPT.     Follow Up Recommendations  SNF (per RN no family to assist)     Equipment Recommendations  None recommended by PT    Recommendations for Other Services       Precautions / Restrictions Precautions Precautions: Fall    Mobility  Bed Mobility Overal bed mobility: Needs Assistance Bed Mobility: Supine to Sit;Sit to Supine     Supine to sit: Supervision Sit to supine: Supervision      Transfers Overall transfer level: Needs assistance Equipment used: 1 person hand held assist Transfers: Sit to/from Stand Sit to Stand: Min assist         General transfer comment: Required 2 attempts due to LOB; min A to steady  Ambulation/Gait Ambulation/Gait assistance: Min assist Gait Distance (Feet): 100 Feet Assistive device: 1 person hand held assist Gait Pattern/deviations: Step-through pattern;Trunk flexed;Drifts right/left Gait velocity: decreased   General Gait Details: Min A to steady; declined use of RW   Animal nutritionist Rankin (Stroke Patients Only)       Balance Overall balance assessment: Needs assistance Sitting-balance support: Feet supported Sitting balance-Leahy Scale: Good     Standing balance support: No upper extremity supported Standing balance-Leahy Scale: Poor Standing balance comment: Requiring min A to steady                            Cognition Arousal/Alertness: Awake/alert Behavior During Therapy: WFL for tasks assessed/performed Overall Cognitive Status: No family/caregiver present to determine baseline cognitive functioning                                 General Comments: Pt daughter reporting ongoing cognitive impairments. Pt pleasant, able to be easily redirected and following all commands.      Exercises      General Comments General comments (skin integrity, edema, etc.): VSS on RA      Pertinent Vitals/Pain Pain Assessment: No/denies pain    Home Living                      Prior Function            PT Goals (current goals can now be found in the care plan section) Acute Rehab PT Goals Patient Stated Goal: get chocolate ice cream PT Goal Formulation: With  patient Time For Goal Achievement: 02/15/20 Potential to Achieve Goals: Good Progress towards PT goals: Progressing toward goals    Frequency    Min 3X/week      PT Plan Discharge plan needs to be updated    Co-evaluation              AM-PAC PT "6 Clicks" Mobility   Outcome Measure  Help needed turning from your back to your side while in a flat bed without using bedrails?: A Little Help needed moving from lying on your back to sitting on the side of a flat bed without using bedrails?: A Little Help needed moving to and from a bed to a chair (including a wheelchair)?: A Little Help needed standing up from a chair using your arms (e.g., wheelchair or bedside chair)?: A Little Help needed to walk in hospital room?: A Little Help needed  climbing 3-5 steps with a railing? : A Lot 6 Click Score: 17    End of Session Equipment Utilized During Treatment: Gait belt Activity Tolerance: Patient tolerated treatment well Patient left: in bed;with call bell/phone within reach Nurse Communication: Mobility status PT Visit Diagnosis: Unsteadiness on feet (R26.81);Difficulty in walking, not elsewhere classified (R26.2)     Time: 3790-2409 PT Time Calculation (min) (ACUTE ONLY): 15 min  Charges:  $Gait Training: 8-22 mins                     Anise Salvo, PT Acute Rehab Services Pager [phone] [facility] [phone]     Rayetta Humphrey 02/03/2020, 2:55 PM

## 2020-02-03 NOTE — Progress Notes (Signed)
PROGRESS NOTE    MUNACHIMSO RIGDON  TGY:[id number] DOB: [birth year] DOA: 02/01/2020 PCP: [facility]   Brief Narrative:  Tracy Barton is a 75 y.o. female with medical history significant for hypertension, history of alcohol abuse, H. pylori gastritis, and recent admission for perforated gastric ulcer, now presenting to the emergency department with weakness, a fall, and syncope.  Patient reports that she has been generally weak and having difficulty ambulating since hospital discharge on 01/23/2020, fell out of bed last night, and was then seen by EMS to have a syncopal episode after a large loose BM.  Patient denies any chest pain or palpitations.  She denies any shortness of breath.  Reports that she has had a cough for approximately 1 month.  She denies any recent alcohol use, reports that she only has 2 beers a week or less.  She has not noticed any fevers or chills. Notably in the ED: blood pressure 90/52.  EKG features sinus bradycardia with rate 46.  Chest x-ray negative for acute cardiopulmonary disease.  Head CT negative for acute findings.  Chemistry panel unremarkable and CBC notable for stable normocytic anemia and thrombocytosis.  Patient was given 2 L of LR in the ED.  COVID-19 came back positive.  Assessment & Plan:   Principal Problem:   Syncope Active Problems:   Gastritis and gastroduodenitis   Perforated prepyloric gastric ulcer s/p omental Cheree Ditto patch 01/10/2020   Acute metabolic encephalopathy   Chronic alcohol abuse   Essential hypertension   COVID-19   Sinus bradycardia on ECG  Acute metabolic/toxic encephalopathy in the setting of alcohol abuse and subsequent withdrawal Cannot rule out hospital delirium, resolving - Increasing CIWA protocols initiated overnight with minimal improvement in confusion -Mental status improving somewhat over the past 24 hours, will attempt to remove restraints, low threshold to replace if patient becomes combative or  becomes a risk to herself  Vasovagal syncope, resolved - Presents after a syncopal episode that occurred after a large BM  - Currently without further episodes of hypotension; despite bradycardia at rest - Continue cardiac monitoring, repeat orthostatics after IVF    Sinus bradycardia, ongoing  - HR in 40s in ED, sinus bradycarida on EKG  - She had a vasovagal event pta but has been several hours now and HR still 40s on monitor  Orthostatic VS for the past 24 hrs:  BP- Lying Pulse- Lying BP- Sitting Pulse- Sitting BP- Standing at 0 minutes Pulse- Standing at 0 minutes  02/02/20 1256 147/67 (!) 46 152/73 (!) 47 152/73 (!) 48   Peptic ulcer disease/GERD  - Admitted recently with perforated gastric ulcer s/p omental Graham patch on 01/10/20, had positive H pylori and started two wks of PPI, clarithromycin, and amoxicillin   Incidentally positive COVID-19  - No acute indication for treatment - Continue isolation, monitor    Anemia; thrombocytosis  - Stable, follow morning labs   Hypertension  -Currently holding any rate control medications, blood pressure well controlled today  Acute dilatory dysfunction in the setting of chronic deconditioning, elevated fall risk - Patient reports general weakness since hospital discharge on 01/23/2020, has had difficulty ambulating -PT OT evaluation ongoing  DVT prophylaxis: Lovenox  Code Status: Full  Family Communication: Left voicemail for daughter Cherly Hensen, no answer  Status is: Inpatient  Dispo: The patient is from: Home              Anticipated d/c is to: To be determined; family unavailable to come see the patient  and evaluate for possible discharge home, per nursing discussion with family this morning family is no longer willing to reaccept patient back to her previous living situation with her son and daughter-in-law.  Unclear where her safe disposition will be at this point.              Anticipated d/c date is: 24 to 48 hours               Patient currently not medically stable for discharge  Consultants:   None  Procedures:   None  Antimicrobials:  Amoxicillin  Subjective: Much more awake alert this morning, more appropriate back to baseline confusion per discussion with family previously.  Otherwise review of systems somewhat limited in setting of patient's mental status.  Objective: Vitals:   02/02/20 1900 02/02/20 2015 02/02/20 2351 02/03/20 0311  BP: 130/70 (!) 81/64 97/72 138/64  Pulse: (!) 50 (!) 50 (!) 55 (!) 56  Resp: 17 18 14 16   Temp:  97.7 F (36.5 C) 97.7 F (36.5 C) 97.7 F (36.5 C)  TempSrc:  Oral Oral Oral  SpO2: 99% 98% 98% 98%  Weight:    41.3 kg  Height:        Intake/Output Summary (Last 24 hours) at 02/03/2020 04/02/2020 Last data filed at 02/03/2020 04/02/2020 Gross per 24 hour  Intake 242 ml  Output 750 ml  Net -508 ml   Filed Weights   02/01/20 0059 02/02/20 0500 02/03/20 0311  Weight: 47.6 kg 48 kg 41.3 kg    Examination:  General: Cachectic appearing elderly female resting in bed, awake alert oriented to person only, pleasantly confused able to follow simple commands. HEENT:  Normocephalic atraumatic.  Sclerae nonicteric, noninjected.  Extraocular movements intact bilaterally. Neck:  Without mass or deformity.  Trachea is midline. Lungs:  Clear to auscultate bilaterally without rhonchi, wheeze, or rales. Heart:  Regular rate and rhythm.  Without murmurs, rubs, or gallops. Abdomen:  Soft, nontender, nondistended.  Without guarding or rebound. Extremities: Without cyanosis, clubbing, edema, or obvious deformity. Vascular:  Dorsalis pedis and posterior tibial pulses palpable bilaterally. Skin:  Warm and dry, no erythema, no ulcerations.  Data Reviewed: I have personally reviewed following labs and imaging studies  CBC: Recent Labs  Lab 02/01/20 0117 02/02/20 0058 02/03/20 0128  WBC 6.8 5.6 5.6  NEUTROABS 4.5  --   --   HGB 10.0* 9.3* 10.2*  HCT 32.2* 28.6* 31.9*  MCV  92.8 89.9 90.1  PLT 524* 454* 502*   Basic Metabolic Panel: Recent Labs  Lab 02/01/20 0117 02/02/20 0058 02/03/20 0128  NA 141 140 141  K 3.6 3.7 3.4*  CL 105 107 105  CO2 25 25 27   GLUCOSE 101* 108* 84  BUN 16 14 10   CREATININE 0.97 0.95 0.68  CALCIUM 9.1 9.0 9.4  MG  --  1.6*  --    GFR: Estimated Creatinine Clearance: 39.6 mL/min (by C-G formula based on SCr of 0.68 mg/dL). Liver Function Tests: Recent Labs  Lab 02/01/20 0117  AST 30  ALT 25  ALKPHOS 51  BILITOT 0.4  PROT 5.8*  ALBUMIN 3.0*   No results for input(s): LIPASE, AMYLASE in the last 168 hours. No results for input(s): AMMONIA in the last 168 hours. Coagulation Profile: Recent Labs  Lab 02/01/20 0117  INR 0.9   Cardiac Enzymes: No results for input(s): CKTOTAL, CKMB, CKMBINDEX, TROPONINI in the last 168 hours. BNP (last 3 results) No results for input(s): PROBNP in the last 8760  hours. HbA1C: No results for input(s): HGBA1C in the last 72 hours. CBG: Recent Labs  Lab 02/03/20 0615  GLUCAP 73   Lipid Profile: No results for input(s): CHOL, HDL, LDLCALC, TRIG, CHOLHDL, LDLDIRECT in the last 72 hours. Thyroid Function Tests: No results for input(s): TSH, T4TOTAL, FREET4, T3FREE, THYROIDAB in the last 72 hours. Anemia Panel: No results for input(s): VITAMINB12, FOLATE, FERRITIN, TIBC, IRON, RETICCTPCT in the last 72 hours. Sepsis Labs: No results for input(s): PROCALCITON, LATICACIDVEN in the last 168 hours.  Recent Results (from the past 240 hour(s))  SARS CORONAVIRUS 2 (TAT 6-24 HRS) Nasopharyngeal Nasopharyngeal Swab     Status: Abnormal   Collection Time: 02/01/20  1:18 AM   Specimen: Nasopharyngeal Swab  Result Value Ref Range Status   SARS Coronavirus 2 POSITIVE (A) NEGATIVE Final    Comment: (NOTE) SARS-CoV-2 target nucleic acids are DETECTED.  The SARS-CoV-2 RNA is generally detectable in upper and lower respiratory specimens during the acute phase of infection.  Positive results are indicative of the presence of SARS-CoV-2 RNA. Clinical correlation with patient history and other diagnostic information is  necessary to determine patient infection status. Positive results do not rule out bacterial infection or co-infection with other viruses.  The expected result is Negative.  Fact Sheet for Patients: HairSlick.no  Fact Sheet for Healthcare Providers: quierodirigir.com  This test is not yet approved or cleared by the [place] FDA and  has been authorized for detection and/or diagnosis of SARS-CoV-2 by FDA under an Emergency Use Authorization (EUA). This EUA will remain  in effect (meaning this test can be used) for the duration of the COVID-19 declaration under Section 564(b)(1) of the Act, 21 U. S.C. section 360bbb-3(b)(1), unless the authorization is terminated or revoked sooner.   Performed at [facility] [place] [facility], [address] [place], [place] [address]   MRSA PCR Screening     Status: None   Collection Time: 02/01/20  9:08 PM   Specimen: Nasopharyngeal  Result Value Ref Range Status   MRSA by PCR NEGATIVE NEGATIVE Final    Comment:        The GeneXpert MRSA Assay (FDA approved for NASAL specimens only), is one component of a comprehensive MRSA colonization surveillance program. It is not intended to diagnose MRSA infection nor to guide or monitor treatment for MRSA infections. Performed at [facility], [address], [place], [place] [address]          Radiology Studies: No results found. Scheduled Meds: . amoxicillin  1,000 mg Oral Q12H  . calcium-vitamin D  1 tablet Oral BID WC  . enoxaparin (LOVENOX) injection  40 mg Subcutaneous Daily  . feeding supplement  237 mL Oral BID BM  . folic acid  1 mg Oral Daily  . multivitamin with minerals  1 tablet Oral Daily  . nicotine  21 mg Transdermal Daily  . pantoprazole  40 mg Oral BID  . sodium chloride  flush  3 mL Intravenous Q12H  . thiamine  100 mg Oral Daily   Continuous Infusions: . magnesium sulfate bolus IVPB       LOS: 1 day    Time spent:  Azucena Fallen, DO [facility]  If 7PM-7AM, please contact night-coverage [email or web address]  02/03/2020, 7:18 AM

## 2020-02-04 DIAGNOSIS — G9341 Metabolic encephalopathy: Secondary | ICD-10-CM

## 2020-02-04 LAB — BASIC METABOLIC PANEL
Anion gap: 10 (ref 5–15)
BUN: 9 mg/dL (ref 8–23)
CO2: 23 mmol/L (ref 22–32)
Calcium: 9.4 mg/dL (ref 8.9–10.3)
Chloride: 107 mmol/L (ref 98–111)
Creatinine, Ser: 0.76 mg/dL (ref 0.44–1.00)
GFR, Estimated: >60 mL/min (ref >60)
Glucose, Bld: 116 mg/dL — ABNORMAL HIGH (ref 70–99)
Potassium: 3.6 mmol/L (ref 3.5–5.1)
Sodium: 140 mmol/L (ref 135–145)

## 2020-02-04 LAB — CBC
HCT: 33.3 % — ABNORMAL LOW (ref 36.0–46.0)
Hemoglobin: 10.7 g/dL — ABNORMAL LOW (ref 12.0–15.0)
MCH: 29.2 pg (ref 26.0–34.0)
MCHC: 32.1 g/dL (ref 30.0–36.0)
MCV: 90.7 fL (ref 80.0–100.0)
Platelets: 516 10*3/uL — ABNORMAL HIGH (ref 150–400)
RBC: 3.67 MIL/uL — ABNORMAL LOW (ref 3.87–5.11)
RDW: 13.1 % (ref 11.5–15.5)
WBC: 6.4 10*3/uL (ref 4.0–10.5)
nRBC: 0 % (ref 0.0–0.2)

## 2020-02-04 LAB — GLUCOSE, CAPILLARY: Glucose-Capillary: 88 mg/dL (ref 70–99)

## 2020-02-04 MED ORDER — ENOXAPARIN SODIUM 30 MG/0.3ML ~~LOC~~ SOLN: 30.0000 mg | Freq: Every day | SUBCUTANEOUS | Status: DC

## 2020-02-04 NOTE — Progress Notes (Signed)
Per dr Natale Milch, patient is medically stable to leave, does not qualify for SNF placement, however patient's daughter ms gattis is unable to get her today (not sure how long it would be) because she is currently sick with covid--ms gattis is calling other family members and contacting insurance co to see what else could be covered for mother's care until they can make arrangements for family to get her, dr Natale Milch advised

## 2020-02-04 NOTE — TOC Progression Note (Addendum)
Transition of Care [facility]) - Progression Note    Patient Details  Name: Tracy Barton MRN: [id number] Date of Birth: 1945-09-06  [facility] CM/SW Contact  Erin Sons, [place] Phone Number: 02/04/2020, 12:17 PM  Clinical Narrative:     CSW called pt daughter Orland Mustard. Brownynn and pt's other daughter are working on discharge plans for who pt can stay with. Orland Mustard states that pt is active with HH from her recent admission about a week ago. CSW reviewed chart and pt is active with [facility]. Once family figures out where pt will be going at discharge, CSW will update [facility].  1224: Brownynn called CSW back and explained pt will be returning to her apartment and that pt's boyfriend will be staying with her. She stated family would also come to check on her. Brownynn stated that one of her sisters will be coming to pick pt up. She stated she is still working out some of the "kinks" and will notify pt when everything is arranged.    CSW called Pearson Grippe with [facility] and confirmed pt is active with them with PT/SW  1549: CSW is informed by RN that family is now refusing to pick pt up. CSW called Brownynn. Orland Mustard stated she was going to call her sister and call CSW back.       Expected Discharge Plan and Services    Home Health       Expected Discharge Date: 02/04/20                                     Social Determinants of Health (SDOH) Interventions    Readmission Risk Interventions No flowsheet data found.

## 2020-02-04 NOTE — Discharge Summary (Signed)
Physician Discharge Summary  Tracy Barton:[id number] DOB: 05/29/1945 DOA: 02/01/2020  PCP: [facility]  Admit date: 02/01/2020 Discharge date: 02/04/2020  Admitted From: Home Disposition: Home with home health  Recommendations for Outpatient Follow-up:  1. Follow up with PCP in 1-2 weeks 2. Please obtain BMP/CBC in one week:  Home Health: Yes  Discharge Condition: Stable CODE STATUS: Full Diet recommendation: As tolerated  Brief/Interim Summary: Tracy Barton a 75 y.o.femalewith medical history significant forhypertension, history of alcohol abuse, H. pylori gastritis, and recent admission for perforated gastric ulcer, now presenting to the emergency department with weakness, a fall, and syncope. Patient reports that she has been generally weak and having difficulty ambulating since hospital discharge on 01/23/2020, fell out of bed last night, and was then seen by EMS to have a syncopal episode after a large loose BM. Patient denies any chest pain or palpitations. She denies any shortness of breath. Reports that she has had a cough for approximately 1 month. She denies any recent alcohol use, reports that she only has 2 beers a week or less. She has not noticed any fevers or chills. Notably in the ED: blood pressure 90/52. EKG features sinus bradycardia with rate 46. Chest x-ray negative for acute cardiopulmonary disease. Head CT negative for acute findings. Chemistry panel unremarkable and CBC notable for stable normocytic anemia and thrombocytosis. Patient was given 2 L of LR in the ED. COVID-19 came back positive.  Patient medically stable for discharge, her vasovagal syncope resolved with IV fluids and increased p.o. intake.  Patient did have episodes of delirium in the setting of alcohol withdrawal which is now resolved.  Patient otherwise stable and agreeable for discharge home.  Given her baseline dementia she had previously lived with her son,  unfortunately there were some social issues with discharging back to her son's house, her other children have made arrangements for safe discharge, will continue with home health physical therapy at discharge.  We discussed patient is high risk for worsening mental status should she continue to abuse alcohol and is high likelihood to go through alcohol withdrawals should she ever quit drinking abruptly again.  Otherwise patient stable and agreeable for discharge.  Discharge Diagnoses:  Principal Problem:   Syncope Active Problems:   Gastritis and gastroduodenitis   Perforated prepyloric gastric ulcer s/p omental Cheree Ditto patch 01/10/2020   Acute metabolic encephalopathy   Chronic alcohol abuse   Essential hypertension   COVID-19   Sinus bradycardia on ECG    Discharge Instructions  Discharge Instructions    Diet - low sodium heart healthy   Complete by: As directed    Face-to-face encounter (required for Medicare/Medicaid patients)   Complete by: As directed    Ambulatory dysfunction   The encounter with the patient was in whole, or in part, for the following medical condition, which is the primary reason for home health care: Ambulatory dysfunction   I certify that, based on my findings, the following services are medically necessary home health services: Physical therapy   Reason for Medically Necessary Home Health Services: Therapy- Instruction on Safe use of Assistive Devices for ADLs   My clinical findings support the need for the above services: Unable to leave home safely without assistance and/or assistive device   Further, I certify that my clinical findings support that this patient is homebound due to: Unable to leave home safely without assistance   Home Health   Complete by: As directed    To provide the  following care/treatments: PT   Increase activity slowly   Complete by: As directed    No wound care   Complete by: As directed    No wound care   Complete by: As  directed      Allergies as of 02/04/2020      Reactions   Nsaids Other (See Comments)   PERFORATED ULCER      Medication List    STOP taking these medications   QUEtiapine 25 MG tablet Commonly known as: SEROQUEL   traMADol 50 MG tablet Commonly known as: ULTRAM     TAKE these medications   amLODipine 5 MG tablet Commonly known as: NORVASC Take 1 tablet (5 mg total) by mouth daily.   amoxicillin 500 MG capsule Commonly known as: AMOXIL Take 2 capsules (1,000 mg total) by mouth every 12 (twelve) hours. What changed: additional instructions   Bone Density 300-200 MG-UNIT Tabs Take 1 tablet by mouth 2 (two) times daily.   clarithromycin 500 MG tablet Commonly known as: BIAXIN Take 1 tablet (500 mg total) by mouth every 12 (twelve) hours. What changed: additional instructions   folic acid 1 MG tablet Commonly known as: FOLVITE Take 1 tablet (1 mg total) by mouth daily.   lansoprazole 30 MG capsule Commonly known as: Prevacid Take 1 capsule (30 mg total) by mouth daily at 12 noon.   lidocaine 5 % Commonly known as: LIDODERM Place 1 patch onto the skin daily. Remove & Discard patch within 12 hours or as directed by MD   pantoprazole 40 MG tablet Commonly known as: PROTONIX Take 1 tablet (40 mg total) by mouth 2 (two) times daily.   thiamine 100 MG tablet Take 1 tablet (100 mg total) by mouth daily.       Follow-up Information    [facility]   Specialty: Home Health Services Why: HHPT, Social Work  [phone]             Allergies  Allergen Reactions  . Nsaids Other (See Comments)    PERFORATED ULCER    Consultations:  None   Procedures/Studies: CT ABDOMEN PELVIS WO CONTRAST  Result Date: 01/10/2020 CLINICAL DATA:  Abdominal pain. EXAM: CT ABDOMEN AND PELVIS WITHOUT CONTRAST TECHNIQUE: Multidetector CT imaging of the abdomen and pelvis was performed following the standard protocol without IV contrast.  COMPARISON:  None. FINDINGS: Lower chest: No acute abnormality. Hepatobiliary: No focal liver abnormality is seen. No gallstones, gallbladder wall thickening, or biliary dilatation. Pancreas: Unremarkable. No pancreatic ductal dilatation or surrounding inflammatory changes. Spleen: Normal in size without focal abnormality. Adrenals/Urinary Tract: Adrenal glands are unremarkable. Kidneys are normal, without renal calculi, focal lesion, or hydronephrosis. Bladder is unremarkable. Stomach/Bowel: Evaluation of the bowel is markedly limited due to the paucity of intra-abdominal fat and oral contrast. The stomach and small bowel are nondistended and grossly unremarkable. Colonic diverticuli are identified, particularly distally. No convincing evidence of diverticulitis. The remainder of the colon is grossly unremarkable. The appendix is best visualized on series 2, images 52 and 53, unremarkable in appearance and air-filled distally. Vascular/Lymphatic: Calcified atherosclerosis is seen in the tortuous nonaneurysmal aorta. No obvious adenopathy. Reproductive: Uterus and bilateral adnexa are unremarkable. Other: There is free throughout the abdomen. There is also a moderate amount of free fluid. Musculoskeletal: Mild AVN in the left femoral head. IMPRESSION: 1. Free air and free fluid in the abdomen and pelvis is identified. Findings are consistent with a bowel perforation but no source for  the free air is otherwise seen. 2. Evaluation of the bowel is markedly limited due to the paucity of intra-abdominal fat and oral contrast. The appendix appears normal however. 3. Colonic diverticulosis without diverticulitis. 4. Calcified atherosclerosis in the tortuous nonaneurysmal aorta. 5. Mild AVN in the left femoral head. 6. Aortic atherosclerosis. Findings called to the patient's PA, Arthor CaptainAbigail Harris. Aortic Atherosclerosis (ICD10-I70.0). Electronically Signed   By: Gerome Samavid  Williams III M.D   On: 01/10/2020 11:09   DG Abd 1  View  Result Date: 01/11/2020 CLINICAL DATA:  NG tube placement EXAM: ABDOMEN - 1 VIEW COMPARISON:  None. FINDINGS: The NG tube terminates in the stomach. IMPRESSION: NG tube terminates in the stomach. Electronically Signed   By: Gerome Samavid  Williams III M.D   On: 01/11/2020 16:32   CT Head Wo Contrast  Result Date: 02/01/2020 CLINICAL DATA:  Altered mental status EXAM: CT HEAD WITHOUT CONTRAST TECHNIQUE: Contiguous axial images were obtained from the base of the skull through the vertex without intravenous contrast. COMPARISON:  MRI from 01/20/2020 FINDINGS: Brain: Encephalomalacia changes are noted in the left occipital lobe medially consistent with prior ischemia. This is stable from the prior MRI. Mild atrophic changes are noted. Scattered chronic white matter ischemic changes are seen as well. Vascular: No hyperdense vessel or unexpected calcification. Skull: Normal. Negative for fracture or focal lesion. Sinuses/Orbits: No acute finding. Other: None. IMPRESSION: Old left occipital lobe infarct. Mild atrophic changes and chronic white matter ischemic changes are seen without acute abnormality. Electronically Signed   By: Alcide CleverMark  Lukens M.D.   On: 02/01/2020 01:42   MR BRAIN WO CONTRAST  Result Date: 01/20/2020 CLINICAL DATA:  Mental status change, unknown cause. EXAM: MRI HEAD WITHOUT CONTRAST TECHNIQUE: Multiplanar, multiecho pulse sequences of the brain and surrounding structures were obtained without intravenous contrast. COMPARISON:  Head CT 03/08/2019. Report from brain MRI 05/08/2007 (images unavailable). FINDINGS: Brain: Mild cerebral and cerebellar atrophy. Chronic cortical/subcortical left PCA territory infarct within the left occipital lobe. Chronic lacunar infarct within the right lentiform nucleus. Background mild to moderate multifocal T2/FLAIR hyperintensity within the cerebral white matter and pons, nonspecific but compatible with chronic small vessel ischemic disease. There are a few small  scattered supratentorial foci of SWI signal loss, likely reflecting nonspecific chronic microhemorrhages. There is no acute infarct. No evidence of intracranial mass. No extra-axial fluid collection. No midline shift. Vascular: Expected proximal arterial flow voids. Skull and upper cervical spine: No focal suspicious marrow lesion. Sinuses/Orbits: Visualized orbits show no acute finding. Trace ethmoid sinus mucosal thickening. IMPRESSION: No evidence of acute intracranial abnormality. Specifically, there is no evidence of acute infarction. Redemonstrated chronic cortical/subcortical left PCA territory infarct. Chronic right basal ganglia lacunar infarct. Background mild generalized atrophy of the brain and mild-to-moderate chronic small vessel ischemic disease. Electronically Signed   By: Jackey LogeKyle  Golden DO   On: 01/20/2020 11:15   DG Chest Port 1 View  Result Date: 02/01/2020 CLINICAL DATA:  Weakness EXAM: PORTABLE CHEST 1 VIEW COMPARISON:  03/08/2019 FINDINGS: The heart size and mediastinal contours are within normal limits. Both lungs are clear. The visualized skeletal structures are unremarkable. IMPRESSION: No active disease. Electronically Signed   By: Alcide CleverMark  Lukens M.D.   On: 02/01/2020 01:38   DG UGI W SINGLE CM (SOL OR THIN BA)  Result Date: 01/14/2020 CLINICAL DATA:  History of perforated gastric ulcer. EXAM: UPPER GI SERIES WITH KUB TECHNIQUE: After obtaining a scout radiograph a routine upper GI series was performed using water-soluble contrast through the existing  NG tube. FLUOROSCOPY TIME:  Fluoroscopy Time:  2 minutes 30 seconds Radiation Exposure Index (if provided by the fluoroscopic device): 32.2 mGy Number of Acquired Spot Images: 4 COMPARISON:  January 11, 2020 FINDINGS: 120 cc of Omnipaque water-soluble contrast was introduced into the stomach in the LPO position. The patient was turned supine and then into the RPO position and subsequently into the RIGHT lateral position. There is no gross  extravasation of water-soluble contrast. The patient during the examination was sedated due to medications administered on the floor and mildly combative such that full evaluation PO contrast administration was not possible. Reflux was seen along the gastric tube to the level of the mid esophagus during the evaluation. IMPRESSION: Limited upper GI evaluation due to patient's mental status. No gross contrast extravasation on water-soluble study with patient positioning as described. If there is continued concern for ongoing leak given mental status could consider administration of water-soluble contrast and abdominal CT to exclude small leak not visible on fluoroscopy limited by patient condition. These results were called by telephone at the time of interpretation on 01/14/2020 at 2:37 pm to provider [facility] , who verbally acknowledged these results. Electronically Signed   By: Donzetta Kohut M.D.   On: 01/14/2020 14:37   [place] EKG SITE RITE  Result Date: 01/13/2020 If Site Rite image not attached, placement could not be confirmed due to current cardiac rhythm.     Subjective: No acute issues or events overnight, she more awake alert oriented this morning, appears to be back to baseline per family discussion patient denies nausea vomiting diarrhea constipation headache fevers or chills.   Discharge Exam: Vitals:   02/04/20 0329 02/04/20 0800  BP: 114/76 133/68  Pulse: 85 86  Resp: 18 15  Temp: 98.3 F (36.8 C) 98.5 F (36.9 C)  SpO2: 92% 96%   Vitals:   02/04/20 0003 02/04/20 0329 02/04/20 0551 02/04/20 0800  BP: 112/77 114/76  133/68  Pulse:  85  86  Resp:  18  15  Temp: 98.3 F (36.8 C) 98.3 F (36.8 C)  98.5 F (36.9 C)  TempSrc: Oral Oral  Oral  SpO2:  92%  96%  Weight:   38.1 kg   Height:   5\' 3"  (1.6 m)     General: Pt is cachectic appearing, alert, awake, not in acute distress Cardiovascular: RRR, S1/S2 +, no rubs, no gallops Respiratory: CTA bilaterally, no  wheezing, no rhonchi Abdominal: Soft, NT, ND, bowel sounds + Extremities: no edema, no cyanosis    The results of significant diagnostics from this hospitalization (including imaging, microbiology, ancillary and laboratory) are listed below for reference.     Microbiology: Recent Results (from the past 240 hour(s))  SARS CORONAVIRUS 2 (TAT 6-24 HRS) Nasopharyngeal Nasopharyngeal Swab     Status: Abnormal   Collection Time: 02/01/20  1:18 AM   Specimen: Nasopharyngeal Swab  Result Value Ref Range Status   SARS Coronavirus 2 POSITIVE (A) NEGATIVE Final    Comment: (NOTE) SARS-CoV-2 target nucleic acids are DETECTED.  The SARS-CoV-2 RNA is generally detectable in upper and lower respiratory specimens during the acute phase of infection. Positive results are indicative of the presence of SARS-CoV-2 RNA. Clinical correlation with patient history and other diagnostic information is  necessary to determine patient infection status. Positive results do not rule out bacterial infection or co-infection with other viruses.  The expected result is Negative.  Fact Sheet for Patients: 02/03/20  Fact Sheet for Healthcare Providers: HairSlick.no  This  test is not yet approved or cleared by the [place] and  has been authorized for detection and/or diagnosis of SARS-CoV-2 by FDA under an Emergency Use Authorization (EUA). This EUA will remain  in effect (meaning this test can be used) for the duration of the COVID-19 declaration under Section 564(b)(1) of the Act, 21 U. S.C. section 360bbb-3(b)(1), unless the authorization is terminated or revoked sooner.   Performed at [facility], [address] [place], [place] [address]   MRSA PCR Screening     Status: None   Collection Time: 02/01/20  9:08 PM   Specimen: Nasopharyngeal  Result Value Ref Range Status   MRSA by PCR NEGATIVE NEGATIVE Final    Comment:         The GeneXpert MRSA Assay (FDA approved for NASAL specimens only), is one component of a comprehensive MRSA colonization surveillance program. It is not intended to diagnose MRSA infection nor to guide or monitor treatment for MRSA infections. Performed at [facility], [address], [place], [place] [address]      Labs: BNP (last 3 results) No results for input(s): BNP in the last 8760 hours. Basic Metabolic Panel: Recent Labs  Lab 02/01/20 0117 02/02/20 0058 02/03/20 0128 02/04/20 0036  NA 141 140 141 140  K 3.6 3.7 3.4* 3.6  CL 105 107 105 107  CO2 25 25 27 23   GLUCOSE 101* 108* 84 116*  BUN 16 14 10 9   CREATININE 0.97 0.95 0.68 0.76  CALCIUM 9.1 9.0 9.4 9.4  MG  --  1.6*  --   --    Liver Function Tests: Recent Labs  Lab 02/01/20 0117  AST 30  ALT 25  ALKPHOS 51  BILITOT 0.4  PROT 5.8*  ALBUMIN 3.0*   No results for input(s): LIPASE, AMYLASE in the last 168 hours. No results for input(s): AMMONIA in the last 168 hours. CBC: Recent Labs  Lab 02/01/20 0117 02/02/20 0058 02/03/20 0128 02/04/20 0036  WBC 6.8 5.6 5.6 6.4  NEUTROABS 4.5  --   --   --   HGB 10.0* 9.3* 10.2* 10.7*  HCT 32.2* 28.6* 31.9* 33.3*  MCV 92.8 89.9 90.1 90.7  PLT 524* 454* 502* 516*   Cardiac Enzymes: No results for input(s): CKTOTAL, CKMB, CKMBINDEX, TROPONINI in the last 168 hours. BNP: Invalid input(s): POCBNP CBG: Recent Labs  Lab 02/03/20 0615 02/04/20 0549  GLUCAP 73 88   D-Dimer No results for input(s): DDIMER in the last 72 hours. Hgb A1c No results for input(s): HGBA1C in the last 72 hours. Lipid Profile No results for input(s): CHOL, HDL, LDLCALC, TRIG, CHOLHDL, LDLDIRECT in the last 72 hours. Thyroid function studies No results for input(s): TSH, T4TOTAL, T3FREE, THYROIDAB in the last 72 hours.  Invalid input(s): FREET3 Anemia work up No results for input(s): VITAMINB12, FOLATE, FERRITIN, TIBC, IRON, RETICCTPCT in the last 72  hours. Urinalysis    Component Value Date/Time   COLORURINE STRAW (A) 02/01/2020 0545   APPEARANCEUR CLEAR 02/01/2020 0545   LABSPEC 1.006 02/01/2020 0545   PHURINE 6.0 02/01/2020 0545   GLUCOSEU NEGATIVE 02/01/2020 0545   HGBUR NEGATIVE 02/01/2020 0545   BILIRUBINUR NEGATIVE 02/01/2020 0545   BILIRUBINUR neg 03/24/2013 1409   KETONESUR NEGATIVE 02/01/2020 0545   PROTEINUR NEGATIVE 02/01/2020 0545   UROBILINOGEN 0.2 03/24/2013 1409   UROBILINOGEN 0.2 09/24/2008 1045   NITRITE NEGATIVE 02/01/2020 0545   LEUKOCYTESUR NEGATIVE 02/01/2020 0545   Sepsis Labs Invalid input(s): PROCALCITONIN,  WBC,  LACTICIDVEN Microbiology Recent Results (from the past 240 hour(s))  SARS CORONAVIRUS 2 (TAT 6-24 HRS) Nasopharyngeal Nasopharyngeal Swab     Status: Abnormal   Collection Time: 02/01/20  1:18 AM   Specimen: Nasopharyngeal Swab  Result Value Ref Range Status   SARS Coronavirus 2 POSITIVE (A) NEGATIVE Final    Comment: (NOTE) SARS-CoV-2 target nucleic acids are DETECTED.  The SARS-CoV-2 RNA is generally detectable in upper and lower respiratory specimens during the acute phase of infection. Positive results are indicative of the presence of SARS-CoV-2 RNA. Clinical correlation with patient history and other diagnostic information is  necessary to determine patient infection status. Positive results do not rule out bacterial infection or co-infection with other viruses.  The expected result is Negative.  Fact Sheet for Patients: HairSlick.no  Fact Sheet for Healthcare Providers: quierodirigir.com  This test is not yet approved or cleared by the [place] FDA and  has been authorized for detection and/or diagnosis of SARS-CoV-2 by FDA under an Emergency Use Authorization (EUA). This EUA will remain  in effect (meaning this test can be used) for the duration of the COVID-19 declaration under Section 564(b)(1) of the Act, 21 U.  S.C. section 360bbb-3(b)(1), unless the authorization is terminated or revoked sooner.   Performed at [facility], [address], [place], [place] [address]   MRSA PCR Screening     Status: None   Collection Time: 02/01/20  9:08 PM   Specimen: Nasopharyngeal  Result Value Ref Range Status   MRSA by PCR NEGATIVE NEGATIVE Final    Comment:        The GeneXpert MRSA Assay (FDA approved for NASAL specimens only), is one component of a comprehensive MRSA colonization surveillance program. It is not intended to diagnose MRSA infection nor to guide or monitor treatment for MRSA infections. Performed at [facility], [address] [place], [place] [address]      Time coordinating discharge: Over 30 minutes  SIGNED:   Azucena Fallen, DO [facility] 02/04/2020, 12:36 PM Pager   If 7PM-7AM, please contact night-coverage [email or web address]

## 2020-02-04 NOTE — TOC Progression Note (Signed)
Transition of Care [facility]) - Progression Note    Patient Details  Name: Tracy Barton MRN: [id number] Date of Birth: 01-06-1945  [facility] CM/SW Contact  Leone Haven, RN Phone Number: 02/04/2020, 5:19 PM  Clinical Narrative:    NCM received call from [facility] stating she is on her way to pick up patient.         Expected Discharge Plan and Services           Expected Discharge Date: 02/04/20                                     Social Determinants of Health (SDOH) Interventions    Readmission Risk Interventions No flowsheet data found.

## 2020-04-12 ENCOUNTER — Emergency Department: Payer: Medicare HMO

## 2020-04-12 ENCOUNTER — Emergency Department
Admission: EM | Admit: 2020-04-12 | Discharge: 2020-04-12 | Disposition: A | Discharge status: Home / Self Care | Payer: Medicare HMO | Attending: Emergency Medicine | Admitting: Emergency Medicine

## 2020-04-12 DIAGNOSIS — R55 Syncope and collapse: Secondary | ICD-10-CM | POA: Insufficient documentation

## 2020-04-12 DIAGNOSIS — K859 Acute pancreatitis without necrosis or infection, unspecified: Secondary | ICD-10-CM | POA: Insufficient documentation

## 2020-04-12 DIAGNOSIS — R400 Somnolence: Secondary | ICD-10-CM | POA: Diagnosis not present

## 2020-04-12 DIAGNOSIS — F17200 Nicotine dependence, unspecified, uncomplicated: Secondary | ICD-10-CM | POA: Insufficient documentation

## 2020-04-12 DIAGNOSIS — R531 Weakness: Secondary | ICD-10-CM | POA: Diagnosis not present

## 2020-04-12 DIAGNOSIS — Z79899 Other long term (current) drug therapy: Secondary | ICD-10-CM | POA: Insufficient documentation

## 2020-04-12 DIAGNOSIS — R0902 Hypoxemia: Secondary | ICD-10-CM | POA: Diagnosis not present

## 2020-04-12 DIAGNOSIS — R109 Unspecified abdominal pain: Secondary | ICD-10-CM | POA: Diagnosis not present

## 2020-04-12 LAB — URINALYSIS, COMPLETE (UACMP) WITH MICROSCOPIC
Bilirubin Urine: NEGATIVE
Glucose, UA: NEGATIVE mg/dL
Hgb urine dipstick: NEGATIVE
Ketones, ur: NEGATIVE mg/dL
Leukocytes,Ua: NEGATIVE
Nitrite: NEGATIVE
Protein, ur: NEGATIVE mg/dL
Specific Gravity, Urine: 1.019 (ref 1.005–1.030)
pH: 5 (ref 5.0–8.0)

## 2020-04-12 LAB — CBC WITH DIFFERENTIAL/PLATELET
Abs Immature Granulocytes: 0.04 10*3/uL (ref 0.00–0.07)
Basophils Absolute: 0 10*3/uL (ref 0.0–0.1)
Basophils Relative: 1 %
Eosinophils Absolute: 0.2 10*3/uL (ref 0.0–0.5)
Eosinophils Relative: 2 %
HCT: 40.4 % (ref 36.0–46.0)
Hemoglobin: 12.9 g/dL (ref 12.0–15.0)
Immature Granulocytes: 1 %
Lymphocytes Relative: 41 %
Lymphs Abs: 3.5 10*3/uL (ref 0.7–4.0)
MCH: 27 pg (ref 26.0–34.0)
MCHC: 31.9 g/dL (ref 30.0–36.0)
MCV: 84.5 fL (ref 80.0–100.0)
Monocytes Absolute: 0.6 10*3/uL (ref 0.1–1.0)
Monocytes Relative: 6 %
Neutro Abs: 4.2 10*3/uL (ref 1.7–7.7)
Neutrophils Relative %: 49 %
Platelets: 325 10*3/uL (ref 150–400)
RBC: 4.78 MIL/uL (ref 3.87–5.11)
RDW: 14 % (ref 11.5–15.5)
WBC: 8.6 10*3/uL (ref 4.0–10.5)
nRBC: 0 % (ref 0.0–0.2)

## 2020-04-12 LAB — BASIC METABOLIC PANEL
Anion gap: 9 (ref 5–15)
BUN: 26 mg/dL — ABNORMAL HIGH (ref 8–23)
CO2: 24 mmol/L (ref 22–32)
Calcium: 9.3 mg/dL (ref 8.9–10.3)
Chloride: 106 mmol/L (ref 98–111)
Creatinine, Ser: 0.92 mg/dL (ref 0.44–1.00)
GFR, Estimated: >60 mL/min (ref >60)
Glucose, Bld: 137 mg/dL — ABNORMAL HIGH (ref 70–99)
Potassium: 4.6 mmol/L (ref 3.5–5.1)
Sodium: 139 mmol/L (ref 135–145)

## 2020-04-12 LAB — HEPATIC FUNCTION PANEL
ALT: 15 U/L (ref 0–44)
AST: 21 U/L (ref 15–41)
Albumin: 3.7 g/dL (ref 3.5–5.0)
Alkaline Phosphatase: 67 U/L (ref 38–126)
Bilirubin, Direct: <0.1 mg/dL (ref 0.0–0.2)
Total Bilirubin: 0.5 mg/dL (ref 0.3–1.2)
Total Protein: 6.5 g/dL (ref 6.5–8.1)

## 2020-04-12 LAB — TROPONIN I (HIGH SENSITIVITY): Troponin I (High Sensitivity): 4 ng/L (ref <18)

## 2020-04-12 LAB — LIPASE, BLOOD: Lipase: 60 U/L — ABNORMAL HIGH (ref 11–51)

## 2020-04-12 IMAGING — CT CT HEAD W/O CM
2 series · 15 of 37 positions shown, 18 images · non-contrast
Comparison: [DATE]

CLINICAL DATA: Altered level of consciousness, history of abdominal
pain

EXAM:
CT HEAD WITHOUT CONTRAST
TECHNIQUE: Contiguous axial images were obtained from the base of the skull
through the vertex without intravenous contrast.

[Series 2: head wo · axial · 0.40mm/px · z∈[-141,-31]mm · 12 of 27 slices shown, 15 images]
[im 3/27  brain]
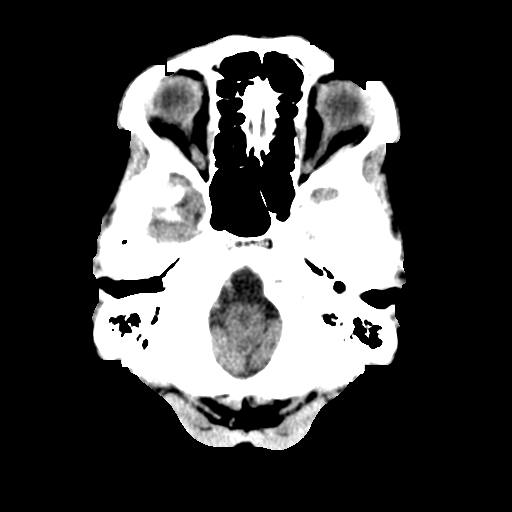
[im 3/27  bone]
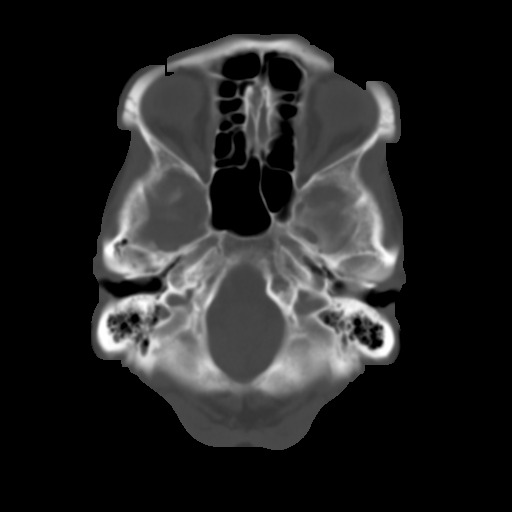
[im 5/27  brain]
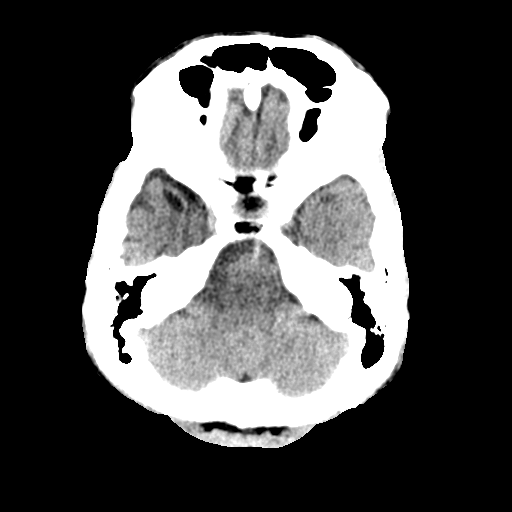
[im 7/27  brain]
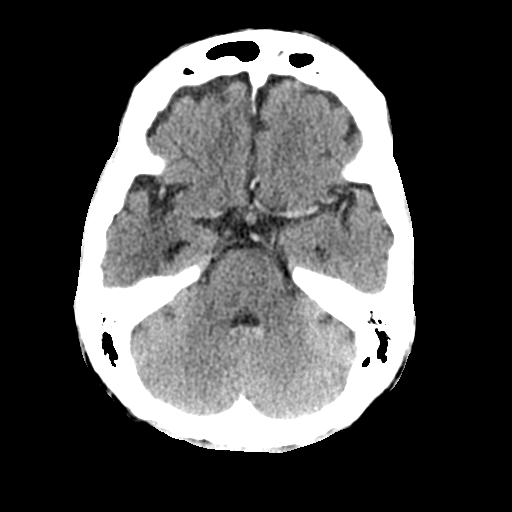
[im 9/27  brain]
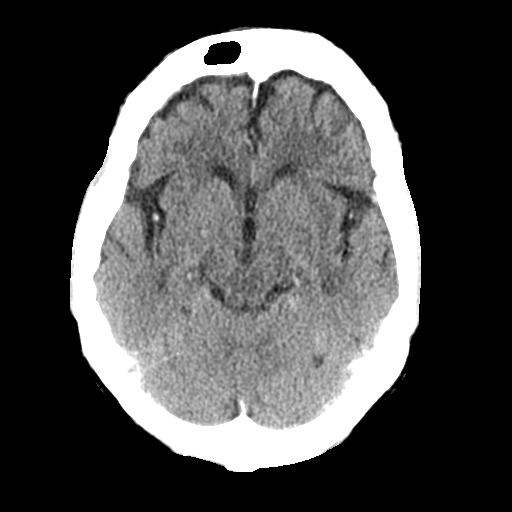
[im 11/27  brain]
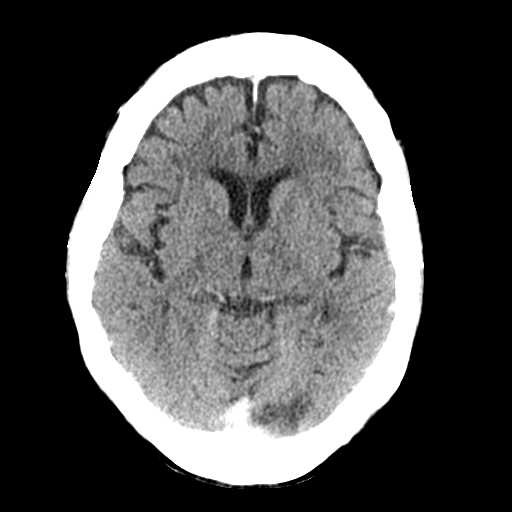
[im 11/27  bone]
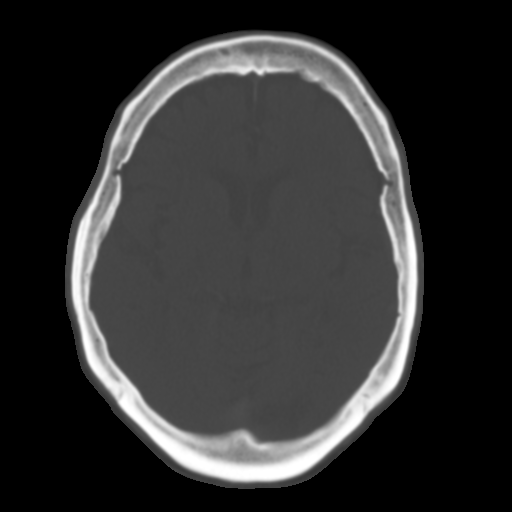
[im 13/27  brain]
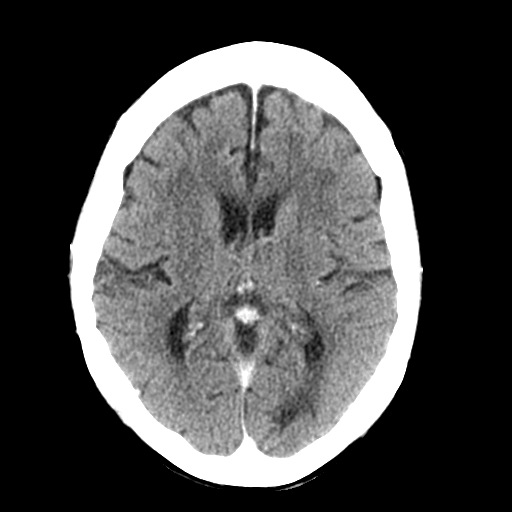
[im 15/27  brain]
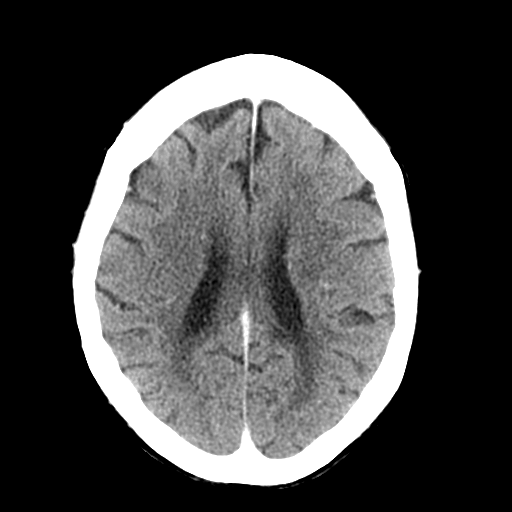
[im 17/27  brain]
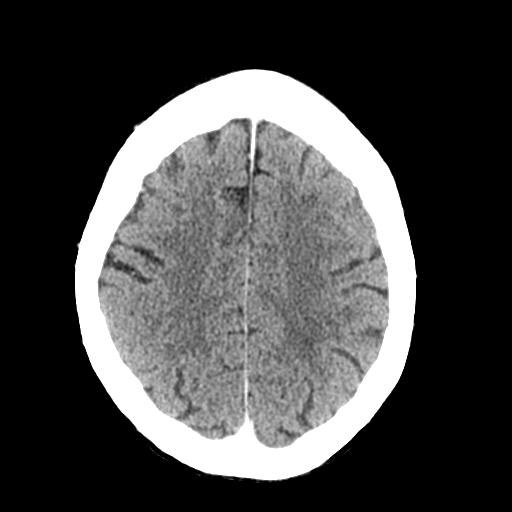
[im 19/27  brain]
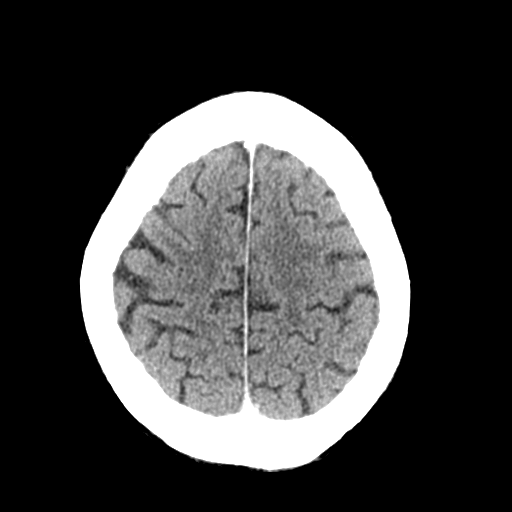
[im 19/27  bone]
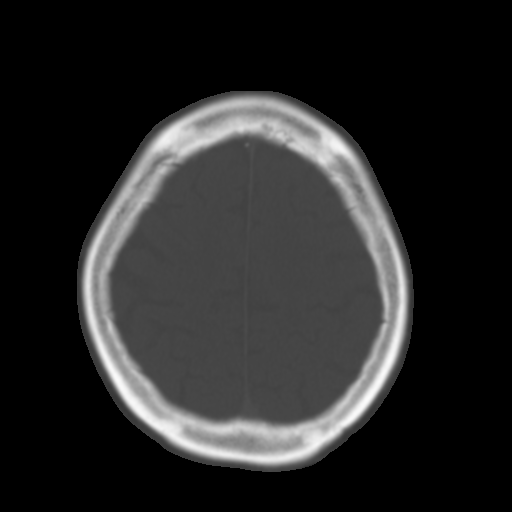
[im 21/27  brain]
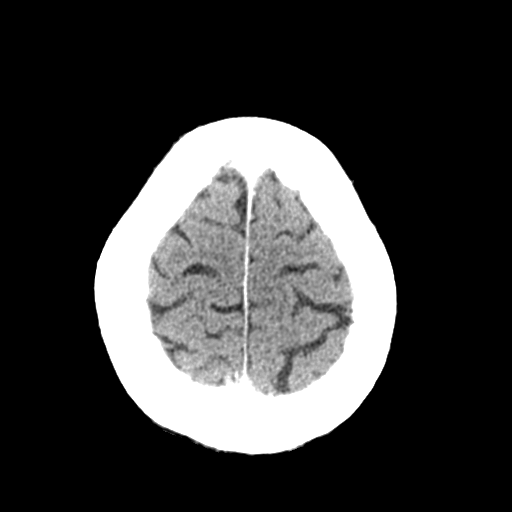
[im 23/27  brain]
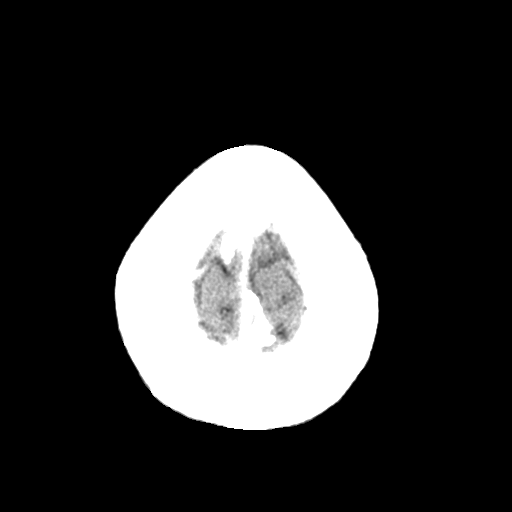
[im 25/27  brain]
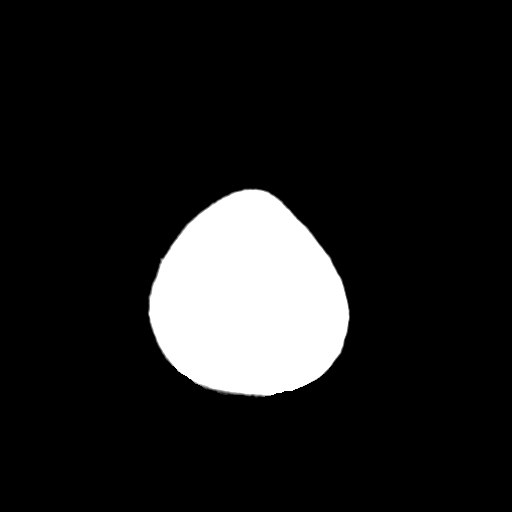

[Series 4: coronal soft tissue · coronal · 0.29mm/px · 3 of 64 slices shown]
[im 22/64  brain]
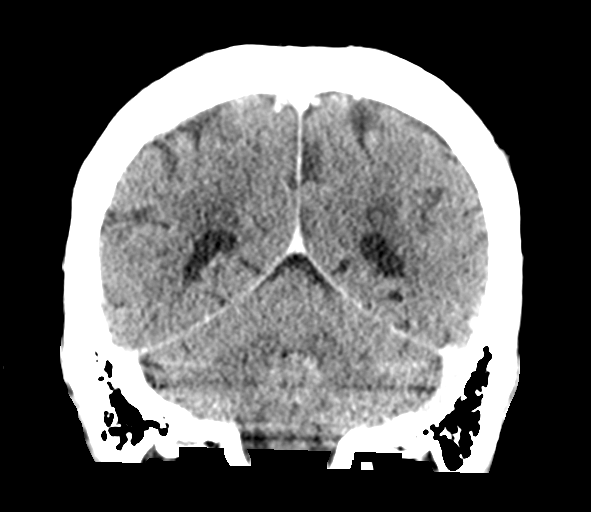
[im 29/64  brain]
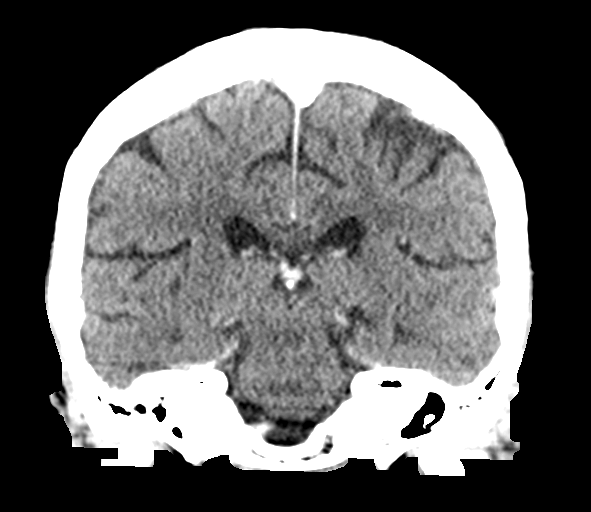
[im 36/64  brain]
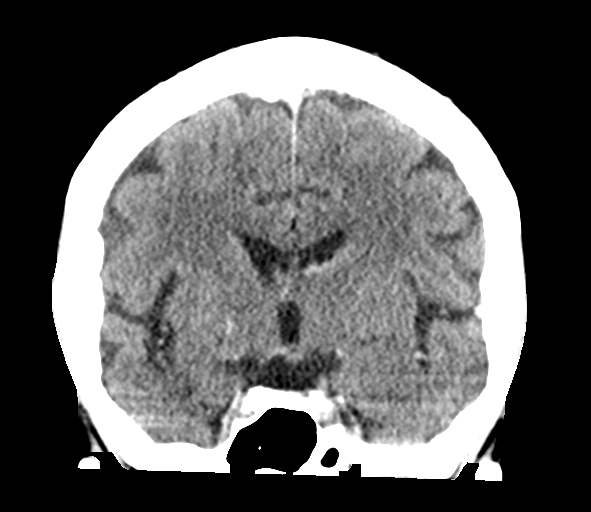

[15 of 37 positions shown; findings below may reference images not displayed]

FINDINGS: Brain: Examination was performed following a contrasted CT of the
abdomen and pelvis. The remaining intravascular contrast limits
evaluation for intraparenchymal hemorrhage.

No signs of acute infarct or hemorrhage. Lateral ventricles and
midline structures are unremarkable. No acute extra-axial fluid
collections. No mass effect.

Vascular: Residual intravascular contrast from preceding CT of the
abdomen and pelvis. Stable atherosclerosis.

Skull: Normal. Negative for fracture or focal lesion.

Sinuses/Orbits: No acute finding.

Other: None.
IMPRESSION: 1. No acute intracranial process.

## 2020-04-12 IMAGING — CT CT ABD-PELV W/ CM
2 of 6 series · 15 of 46 positions shown, 17 images · IV contrast (APPLIED)
Comparison: CT [DATE]

CLINICAL DATA: Left upper quadrant abdominal pain. Elevated lipase.
Generalized weakness.

EXAM:
CT ABDOMEN AND PELVIS WITH CONTRAST
TECHNIQUE: Multidetector CT imaging of the abdomen and pelvis was performed
using the standard protocol following bolus administration of
intravenous contrast.
CONTRAST:  75mL OMNIPAQUE IOHEXOL 300 MG/ML  SOLN

[Series 2: routine abd/pel with · axial · 0.64mm/px · z∈[-476,-111]mm · 12 of 85 slices shown, 14 images]
[im 6/85  soft-tissue]
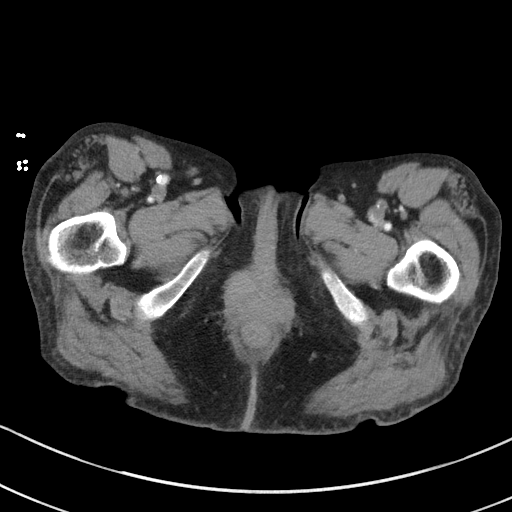
[im 6/85  bone]
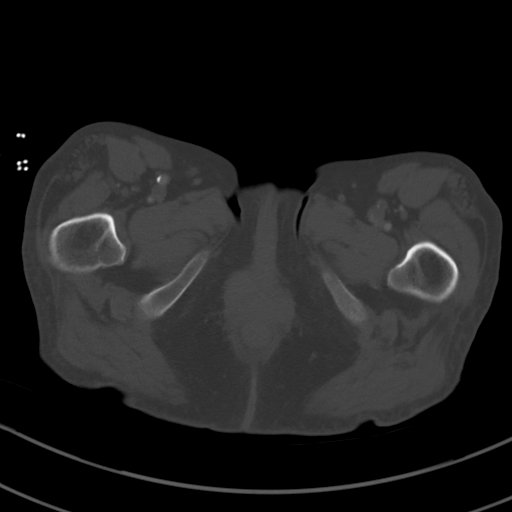
[im 12/85  soft-tissue]
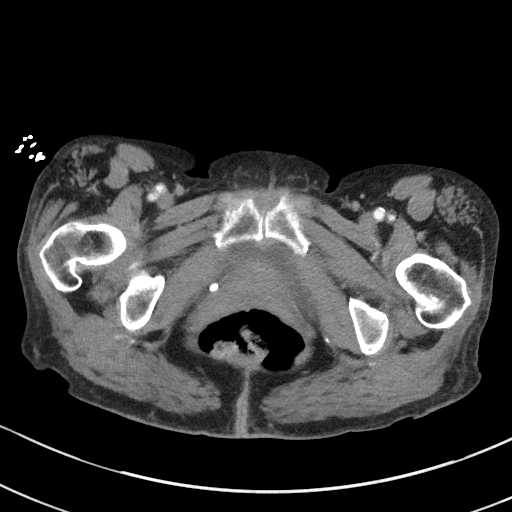
[im 17/85  soft-tissue]
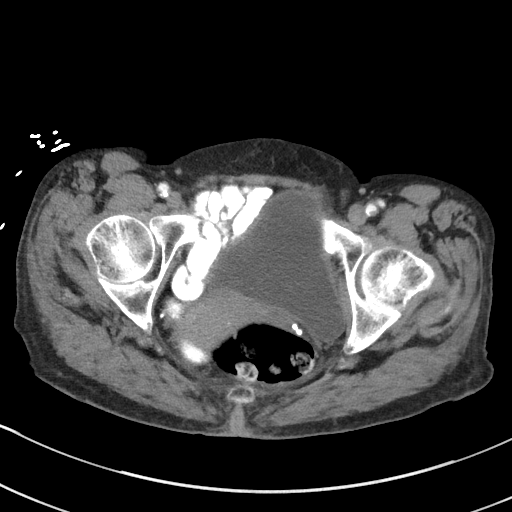
[im 29/85  soft-tissue]
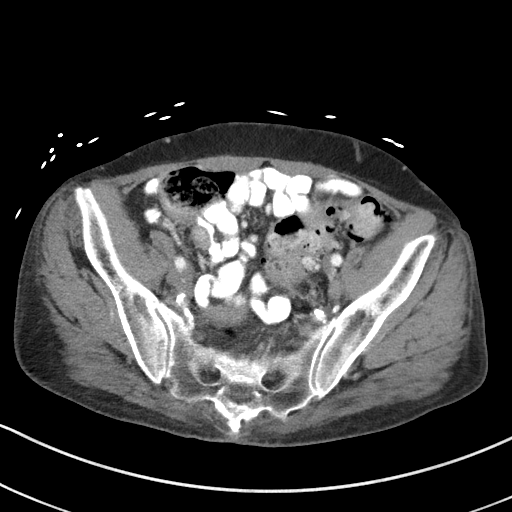
[im 34/85  soft-tissue]
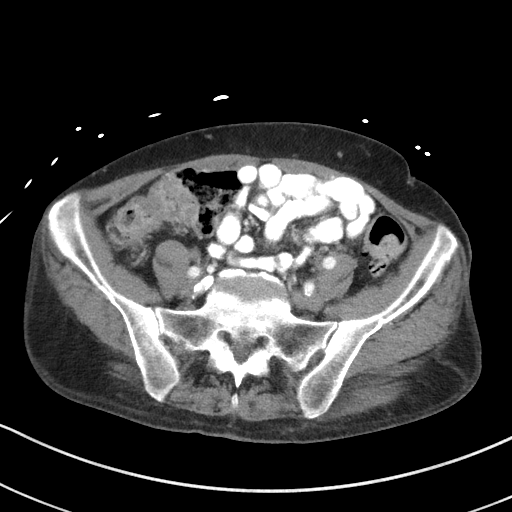
[im 40/85  soft-tissue]
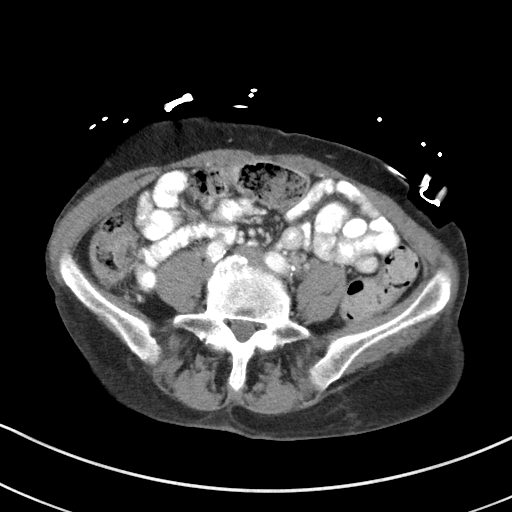
[im 45/85  soft-tissue]
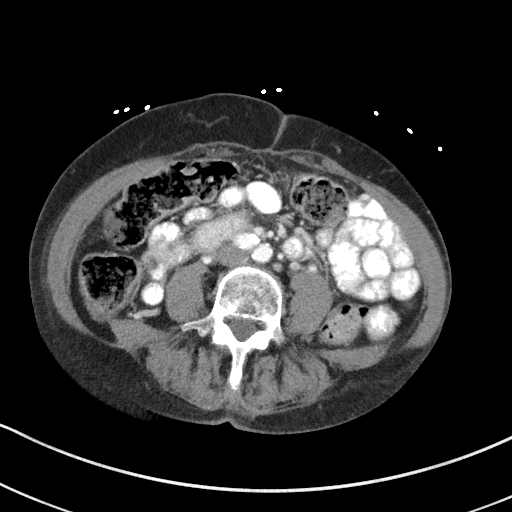
[im 51/85  soft-tissue]
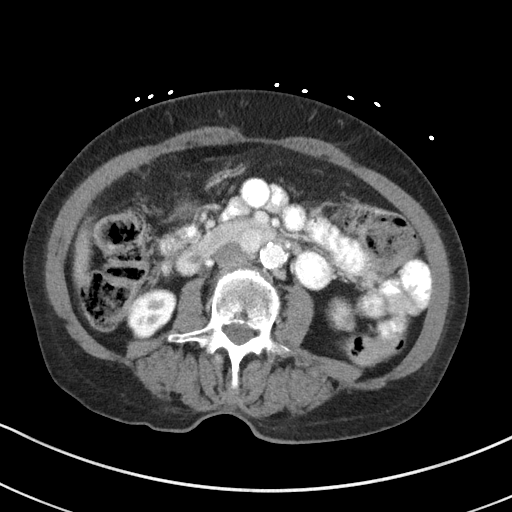
[im 57/85  soft-tissue]
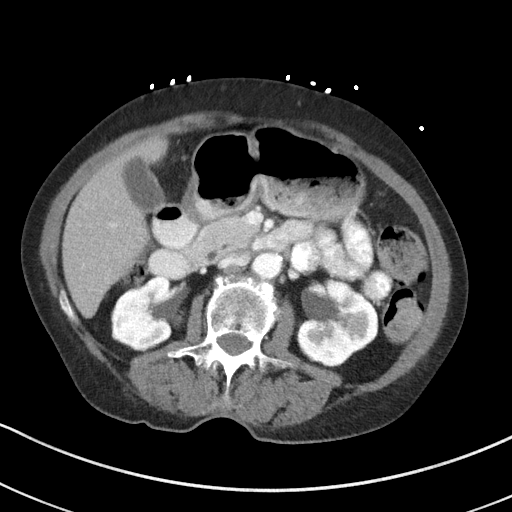
[im 57/85  bone]
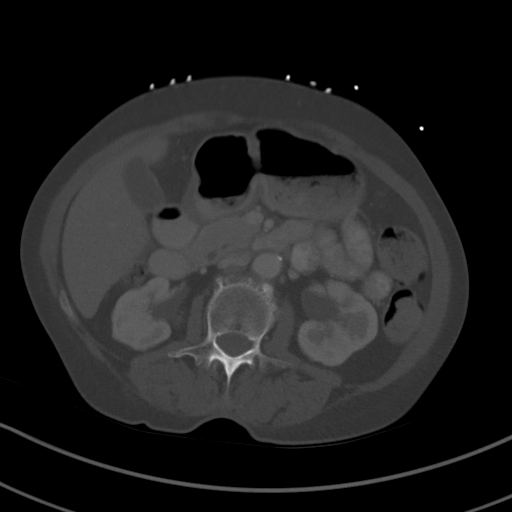
[im 68/85  soft-tissue]
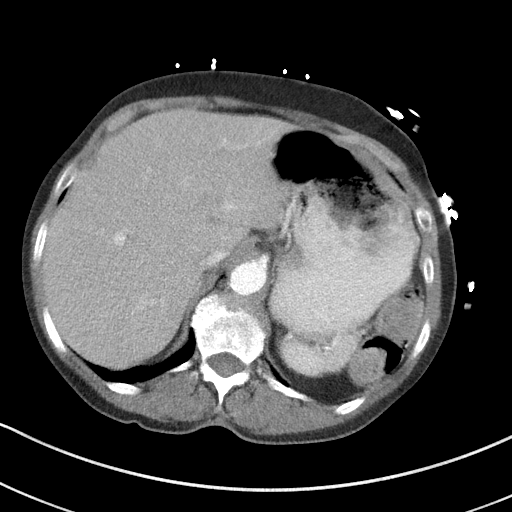
[im 73/85  soft-tissue]
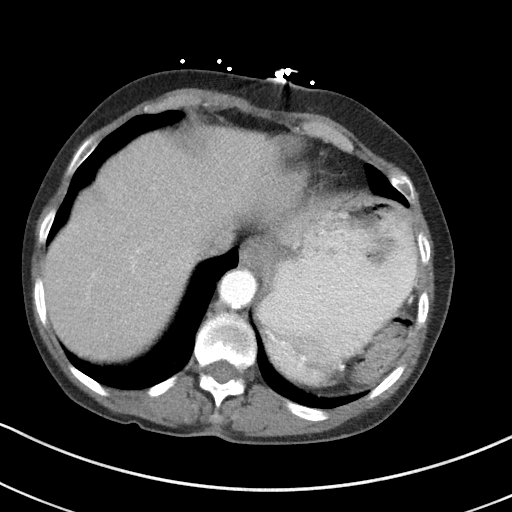
[im 79/85  soft-tissue]
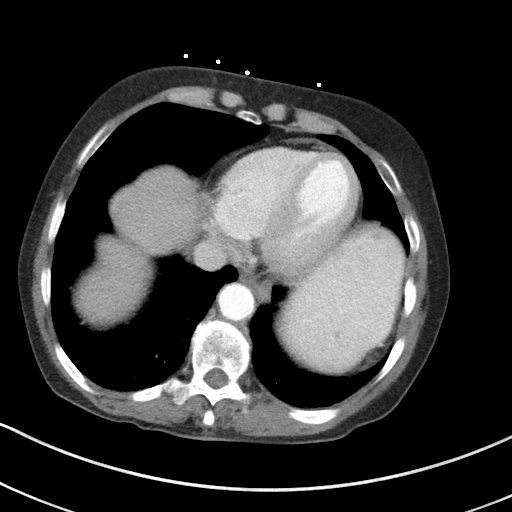

[Series 6: coronal st · coronal · 0.64mm/px · 3 of 84 slices shown]
[im 28/84  soft-tissue]
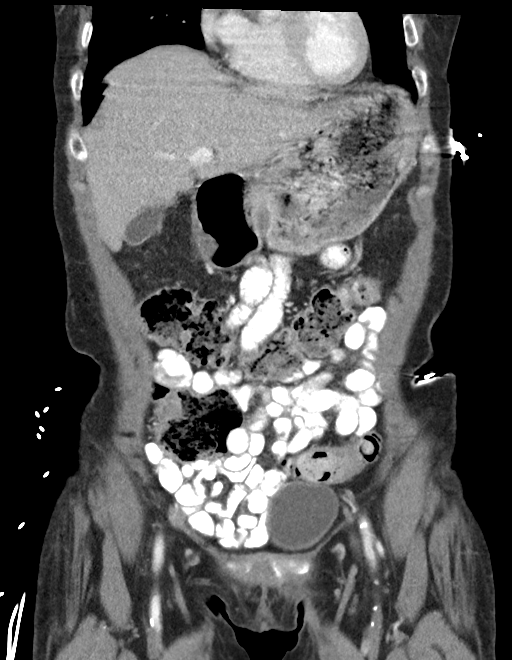
[im 37/84  soft-tissue]
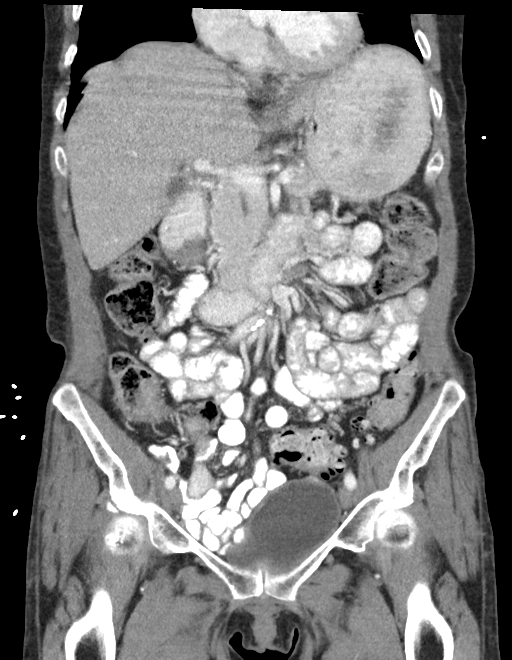
[im 47/84  soft-tissue]
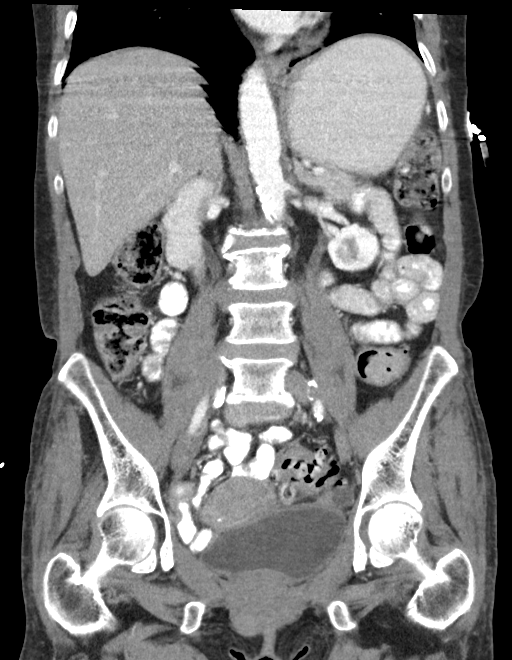

[15 of 46 positions shown; findings below may reference images not displayed]

FINDINGS: Lower chest: Hypoventilatory changes in the lung bases. No acute
airspace disease or pleural effusion.

Hepatobiliary: No focal liver abnormality is seen. No gallstones,
gallbladder wall thickening, or biliary dilatation.

Pancreas: Pancreatic parenchyma is minimally heterogeneous on
delayed phase, no ductal dilatation or definite peripancreatic fat
stranding. No peripancreatic collection.

Spleen: Curvilinear calcifications at the splenic hilum are felt to
be vascular. Spleen is normal in size.

Adrenals/Urinary Tract: Mild adrenal thickening without focal
nodule. No hydronephrosis or perinephric edema. Homogeneous renal
enhancement with symmetric excretion on delayed phase imaging.
Occasional cortical scarring in the left kidney. Urinary bladder is
physiologically distended without wall thickening.

Stomach/Bowel: Stomach is distended with ingested contents. There is
mild pre pyloric gastric wall thickening, series 2, image 31. Normal
positioning of the duodenum and ligament of Treitz. Unremarkable
small bowel without obstruction or wall thickening. Normal
air-filled appendix. Moderate volume of stool throughout the entire
colon. There is distal colonic redundancy. Descending and sigmoid
diverticulosis without focal diverticulitis. Mild stool distended
rectum.

Vascular/Lymphatic: Moderately advanced aortic atherosclerosis. No
aortic aneurysm. Noncalcified plaque causes greater than 50%
narrowing of the proximal right superficial femoral artery. The left
superficial femoral artery is occluded at the origin. Patent portal
vein. No bulky abdominopelvic adenopathy.

Reproductive: Uterine atrophy normal for age.  No adnexal mass.

Other: No free air, ascites, or focal fluid collection.

Musculoskeletal: Avascular necrosis of the left femoral head,
chronic. Degenerative change in the lumbar spine. There are no acute
or suspicious osseous abnormalities.
IMPRESSION: 1. Mild pre pyloric gastric wall thickening, can be seen with
gastritis or peptic ulcer disease.
2. Slight heterogeneous pancreatic parenchyma which may represent
early pancreatitis. No peripancreatic inflammation.
3. Moderate colonic stool burden with distal colonic redundancy,
suggesting constipation. Colonic diverticulosis without
diverticulitis.
4. Aortic atherosclerosis as well as peripheral vascular disease.
Left superficial femoral artery appears occluded at the origin.
Greater than 50% stenosis at the proximal right superficial femoral
artery. Recommend correlation for claudication symptoms.

Aortic Atherosclerosis ([23]-[23]).

## 2020-04-12 MED ORDER — IOHEXOL 9 MG/ML PO SOLN
500.0000 mL | ORAL | Status: AC
  Administered 2020-04-12 (×2): 500 mL via ORAL

## 2020-04-12 MED ORDER — IOHEXOL 300 MG/ML  SOLN
75.0000 mL | Freq: Once | INTRAMUSCULAR | Status: AC | PRN
  Administered 2020-04-12: 75 mL via INTRAVENOUS

## 2020-04-12 NOTE — ED Provider Notes (Signed)
[facility] Emergency Department Provider Note ____________________________________________   Event Date/Time   First MD Initiated Contact with Patient 04/12/20 1218     (approximate)  I have reviewed the triage vital signs and the nursing notes.   HISTORY  Chief Complaint Weakness (Started this am , pt had a spleen removal in february, left side abd pain )    HPI Tracy Barton is a 75 y.o. female with PMH as noted below and gastritis, alcohol abuse, and perforated gastric ulcer who presents primarily for syncope today, acute onset a few hours ago when the patient had just eaten.  She states that she felt lightheaded and weak and then passed out.  It was witnessed by her family member.  She has had some generalized weakness today along with nausea but no vomiting.  She denies any diarrhea.  The patient does report some abdominal pain over the last few months mainly in the left upper part of her abdomen.  Her family member states that she had her spleen removed in February and states that the pain has been present since that time.  Past Medical History:  Diagnosis Date  . Blood transfusion without reported diagnosis   . Depression   . Neuromuscular disorder [facility])     Patient Active Problem List   Diagnosis Date Noted  . Syncope 02/01/2020  . COVID-19 02/01/2020  . Sinus bradycardia on ECG 02/01/2020  . H pylori ulcer 01/23/2020  . Acute metabolic encephalopathy 01/23/2020  . Chronic alcohol abuse 01/23/2020  . Hypokalemia 01/23/2020  . Hypophosphatemia 01/23/2020  . Acute blood loss anemia 01/23/2020  . Essential hypertension 01/23/2020  . Protein-calorie malnutrition, severe 01/15/2020  . Perforated prepyloric gastric ulcer s/p omental Cheree Ditto patch 01/10/2020 01/10/2020  . History of medication noncompliance 01/10/2020  . ARF (acute renal failure) (HCC) 01/10/2020  . 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 Hep C w/o coma, chronic (HCC)  09/12/2014  . Head injury 09/11/2014  . Back injury 04/15/2014  . Smoker 03/24/2013  . Gastritis and gastroduodenitis 03/24/2013  . Loss of weight 03/24/2013  . Unspecified vitamin D deficiency 03/24/2013  . Trigeminal neuralgia 06/01/2011    Past Surgical History:  Procedure Laterality Date  . BRAIN SURGERY    . LAPAROSCOPY N/A 01/10/2020   Procedure: LAPAROSCOPY DIAGNOSTIC laparoscopic washout omental patch splenectomy;  Surgeon: Karie Soda, MD;  Location: [facility] ORS;  Service: General;  Laterality: N/A;  . LAPAROTOMY N/A 01/10/2020   Procedure: EXPLORATORY LAPAROTOMY;  Surgeon: Karie Soda, MD;  Location: [facility] ORS;  Service: General;  Laterality: N/A;  . TUBAL LIGATION      Prior to Admission medications   Medication Sig Start Date End Date Taking? Authorizing Provider  amLODipine (NORVASC) 5 MG tablet Take 1 tablet (5 mg total) by mouth daily. 01/24/20   Rama, Maryruth Bun, MD  amoxicillin (AMOXIL) 500 MG capsule Take 2 capsules (1,000 mg total) by mouth every 12 (twelve) hours. Patient taking differently: Take 1,000 mg by mouth every 12 (twelve) hours. Start date : 01/24/20 01/23/20   Rama, Maryruth Bun, MD  Calcium-Vit D-Arg-Inos-Silicon (BONE DENSITY) 300-200 MG-UNIT TABS Take 1 tablet by mouth 2 (two) times daily. Patient not taking: No sig reported 12/02/12   Carmelina Dane, MD  clarithromycin (BIAXIN) 500 MG tablet Take 1 tablet (500 mg total) by mouth every 12 (twelve) hours. Patient taking differently: Take 500 mg by mouth every 12 (twelve) hours. Start date :01/24/20 01/23/20   Rama, Maryruth Bun, MD  folic  acid (FOLVITE) 1 MG tablet Take 1 tablet (1 mg total) by mouth daily. 01/24/20   Rama, Maryruth Bun, MD  lansoprazole (PREVACID) 30 MG capsule Take 1 capsule (30 mg total) by mouth daily at 12 noon. 01/23/20   Rama, Maryruth Bun, MD  lidocaine (LIDODERM) 5 % Place 1 patch onto the skin daily. Remove & Discard patch within 12 hours or as directed by MD Patient not taking: No sig  reported 01/24/20   Rama, Maryruth Bun, MD  pantoprazole (PROTONIX) 40 MG tablet Take 1 tablet (40 mg total) by mouth 2 (two) times daily. 01/23/20   Rama, Maryruth Bun, MD  thiamine 100 MG tablet Take 1 tablet (100 mg total) by mouth daily. 01/24/20   Rama, Maryruth Bun, MD    Allergies Nsaids  Family History  Problem Relation Age of Onset  . Stroke Mother   . Cancer Mother   . Cancer Brother   . Mental illness Maternal Grandmother     Social History Social History   Tobacco Use  . Smoking status: Current Some Day Smoker    Packs/day: 0.50    Years: 53.00    Pack years: 26.50  . Smokeless tobacco: Never Used  Vaping Use  . Vaping Use: Never used  Substance Use Topics  . Alcohol use: Yes    Alcohol/week: 2.0 standard drinks    Types: 2 Cans of beer per week  . Drug use: No    Review of Systems  Constitutional: No fever/chills Eyes: No visual changes. ENT: No sore throat. Cardiovascular: Denies chest pain. Respiratory: Denies shortness of breath. Gastrointestinal: Positive for nausea.  No vomiting or diarrhea.  Genitourinary: Negative for dysuria.  Musculoskeletal: Negative for back pain. Skin: Negative for rash. Neurological: Negative for headaches, focal weakness or numbness.   ____________________________________________   PHYSICAL EXAM:  VITAL SIGNS: ED Triage Vitals  Enc Vitals Group     BP 04/12/20 1216 123/69     Pulse Rate 04/12/20 1213 (!) 57     Resp 04/12/20 1213 17     Temp 04/12/20 1213 98 F (36.7 C)     Temp Source 04/12/20 1213 Oral     SpO2 04/12/20 1213 98 %     Weight 04/12/20 1214 88 lb 2.9 oz (40 kg)     Height 04/12/20 1214 5\' 3"  (1.6 m)     Head Circumference --      Peak Flow --      Pain Score 04/12/20 1214 0     Pain Loc --      Pain Edu? --      Excl. in GC? --     Constitutional: Alert and oriented x2.  Relatively well appearing and in no acute distress. Eyes: Conjunctivae are normal.  No scleral icterus. Head:  Atraumatic. Nose: No congestion/rhinnorhea. Mouth/Throat: Mucous membranes are slightly dry. Neck: Normal range of motion.  Cardiovascular: Normal rate, regular rhythm. Grossly normal heart sounds.  Good peripheral circulation. Respiratory: Normal respiratory effort.  No retractions. Lungs CTAB. Gastrointestinal: Soft with mild epigastric tenderness.  No distention.  Genitourinary: No flank tenderness. Musculoskeletal: No lower extremity edema.  Extremities warm and well perfused.  Neurologic:  Normal speech and language.  No facial droop.  Motor intact in all extremities.  Normal coordination with no ataxia.  No pronator drift. Skin:  Skin is warm and dry. No rash noted. Psychiatric: Mood and affect are normal. Speech and behavior are normal.  ____________________________________________   LABS (all labs ordered are listed,  but only abnormal results are displayed)  Labs Reviewed  BASIC METABOLIC PANEL - Abnormal; Notable for the following components:      Result Value   Glucose, Bld 137 (*)    BUN 26 (*)    All other components within normal limits  LIPASE, BLOOD - Abnormal; Notable for the following components:   Lipase 60 (*)    All other components within normal limits  URINALYSIS, COMPLETE (UACMP) WITH MICROSCOPIC - Abnormal; Notable for the following components:   Color, Urine YELLOW (*)    APPearance CLEAR (*)    Bacteria, UA RARE (*)    All other components within normal limits  HEPATIC FUNCTION PANEL  CBC WITH DIFFERENTIAL/PLATELET  TROPONIN I (HIGH SENSITIVITY)  TROPONIN I (HIGH SENSITIVITY)   ____________________________________________  EKG  ED ECG REPORT I, Dionne BucySebastian Gertha Lichtenberg, the attending physician, personally viewed and interpreted this ECG.  Date: 04/12/2020 EKG Time: 1217 Rate: 62 Rhythm: normal sinus rhythm QRS Axis: normal Intervals: normal ST/T Wave abnormalities: normal Narrative Interpretation: no evidence of acute  ischemia  ____________________________________________  RADIOLOGY  CT head: No ICH or other acute abnormality CT abdomen/pelvis: Possible mild pancreatitis and gastric wall thickening  ____________________________________________   PROCEDURES  Procedure(s) performed: No  Procedures  Critical Care performed: No ____________________________________________   INITIAL IMPRESSION / ASSESSMENT AND PLAN / ED COURSE  Pertinent labs & imaging results that were available during my care of the patient were reviewed by me and considered in my medical decision making (see chart for details).  [age] year old female with PMH as noted above presents primarily with an episode of syncope today with a prodrome of lightheadedness occurring after she ate.  The family member reports that she has had decreased p.o. intake recently.  She has also had some persistent left upper abdominal pain since abdominal surgery few months ago.  I reviewed the past medical records in Epic.  The patient actually was admitted in January with abdominal pain and a perforated gastric ulcer requiring surgery.  She also had alcohol withdrawal and persistent altered mental status during that admission and there was concern for dementia.  She was subsequently admitted in late January into early February with syncope and hypotension.  She was positive for COVID at that time and her blood pressure improved with fluids.  On exam today the patient is overall well-appearing.  Her vital signs are normal.  She has mild left upper quadrant discomfort but no focal tenderness or peritoneal signs.  Neurologic exam is nonfocal.  She has no tremor, tongue fasciculation, or evidence of alcohol withdrawal.  The patient's daughter states that the patient only has a few beers per week, and has not been drinking heavily recently.  Syncope: Most consistent with vasovagal episode.  The patient had a prodrome of lightheadedness.  Neurologic exam is  nonfocal now.  We will obtain a CT and lab work-up.  Abdominal pain: This is subacute to chronic and likely related to persistent gastritis.  I do not suspect recurrent perforation based on the patient's current exam.  We will obtain lab work-up and a CT for further evaluation.  ----------------------------------------- 4:22 PM on 04/12/2020 -----------------------------------------  Lab work-up is notable for slightly elevated lipase and the CT shows findings compatible with possible mild pancreatitis.  There is also gastric wall thickening consistent with possible gastritis/PUD although this is likely chronic for the patient.  There is no evidence of perforation.  On reassessment, the patient appears comfortable.  She feels well and states that she  wants to go home.  Per the family member, she is at her baseline.    Given that the lab work-up is unremarkable, the vital signs are stable, and the patient has been tolerating p.o., she is appropriate for discharge home.  I discussed the CT results with the patient and family member, specifically with the possibility of mild pancreatitis.  I recommended a bland diet, avoidance of any alcohol, and close follow-up with her primary care doctor.  The patient and her family member agreed.  Return precautions given, and expressed understanding.  ____________________________________________   FINAL CLINICAL IMPRESSION(S) / ED DIAGNOSES  Final diagnoses:  Acute pancreatitis, unspecified complication status, unspecified pancreatitis type  Syncope, unspecified syncope type      NEW MEDICATIONS STARTED DURING THIS VISIT:  Discharge Medication List as of 04/12/2020  4:21 PM       Note:  This document was prepared using Dragon voice recognition software and may include unintentional dictation errors.   Dionne Bucy, MD 04/12/20 1659

## 2020-04-12 NOTE — Discharge Instructions (Signed)
CT scan shows possible mild inflammation in the pancreas which could explain your abdominal pain.  You should avoid any alcohol and eat a bland diet for the next several days.  Return to the ER for new, worsening, or persistent severe abdominal pain, vomiting, fever, weakness, recurrent passing out, or any other new or worsening symptoms that concern you.  Follow-up with your regular doctor in 1 to 2 weeks.

## 2020-04-12 NOTE — ED Triage Notes (Signed)
Pt axox2, abd pain and general weakness today, no nausea, no vimiting , no diarrhea

## 2020-05-08 ENCOUNTER — Emergency Department: Payer: Medicare HMO

## 2020-05-08 ENCOUNTER — Other Ambulatory Visit: Payer: Self-pay

## 2020-05-08 ENCOUNTER — Observation Stay: Payer: Medicare HMO

## 2020-05-08 ENCOUNTER — Observation Stay
Admission: EM | Admit: 2020-05-08 | Discharge: 2020-05-09 | Disposition: A | Discharge status: Home / Self Care | Payer: Medicare HMO | Attending: Internal Medicine | Admitting: Internal Medicine

## 2020-05-08 DIAGNOSIS — I6523 Occlusion and stenosis of bilateral carotid arteries: Secondary | ICD-10-CM | POA: Diagnosis not present

## 2020-05-08 DIAGNOSIS — R29810 Facial weakness: Secondary | ICD-10-CM | POA: Diagnosis not present

## 2020-05-08 DIAGNOSIS — Z720 Tobacco use: Secondary | ICD-10-CM | POA: Diagnosis not present

## 2020-05-08 DIAGNOSIS — K255 Chronic or unspecified gastric ulcer with perforation: Secondary | ICD-10-CM | POA: Diagnosis present

## 2020-05-08 DIAGNOSIS — I4891 Unspecified atrial fibrillation: Secondary | ICD-10-CM

## 2020-05-08 DIAGNOSIS — R001 Bradycardia, unspecified: Secondary | ICD-10-CM | POA: Diagnosis not present

## 2020-05-08 DIAGNOSIS — E43 Unspecified severe protein-calorie malnutrition: Secondary | ICD-10-CM | POA: Diagnosis present

## 2020-05-08 DIAGNOSIS — G500 Trigeminal neuralgia: Secondary | ICD-10-CM | POA: Diagnosis present

## 2020-05-08 DIAGNOSIS — Z8616 Personal history of COVID-19: Secondary | ICD-10-CM | POA: Diagnosis not present

## 2020-05-08 DIAGNOSIS — R9082 White matter disease, unspecified: Secondary | ICD-10-CM | POA: Diagnosis not present

## 2020-05-08 DIAGNOSIS — I10 Essential (primary) hypertension: Secondary | ICD-10-CM | POA: Diagnosis not present

## 2020-05-08 DIAGNOSIS — Z8673 Personal history of transient ischemic attack (TIA), and cerebral infarction without residual deficits: Secondary | ICD-10-CM | POA: Diagnosis not present

## 2020-05-08 DIAGNOSIS — K2970 Gastritis, unspecified, without bleeding: Secondary | ICD-10-CM | POA: Diagnosis not present

## 2020-05-08 DIAGNOSIS — I480 Paroxysmal atrial fibrillation: Secondary | ICD-10-CM | POA: Insufficient documentation

## 2020-05-08 DIAGNOSIS — R531 Weakness: Secondary | ICD-10-CM | POA: Diagnosis not present

## 2020-05-08 DIAGNOSIS — G459 Transient cerebral ischemic attack, unspecified: Secondary | ICD-10-CM | POA: Diagnosis not present

## 2020-05-08 DIAGNOSIS — Z79899 Other long term (current) drug therapy: Secondary | ICD-10-CM | POA: Insufficient documentation

## 2020-05-08 DIAGNOSIS — B182 Chronic viral hepatitis C: Secondary | ICD-10-CM | POA: Diagnosis present

## 2020-05-08 DIAGNOSIS — Z9081 Acquired absence of spleen: Secondary | ICD-10-CM

## 2020-05-08 DIAGNOSIS — R059 Cough, unspecified: Secondary | ICD-10-CM | POA: Diagnosis not present

## 2020-05-08 DIAGNOSIS — R200 Anesthesia of skin: Secondary | ICD-10-CM | POA: Diagnosis not present

## 2020-05-08 DIAGNOSIS — K2990 Gastroduodenitis, unspecified, without bleeding: Secondary | ICD-10-CM | POA: Diagnosis not present

## 2020-05-08 DIAGNOSIS — I639 Cerebral infarction, unspecified: Secondary | ICD-10-CM | POA: Diagnosis not present

## 2020-05-08 DIAGNOSIS — F17210 Nicotine dependence, cigarettes, uncomplicated: Secondary | ICD-10-CM | POA: Diagnosis not present

## 2020-05-08 DIAGNOSIS — G319 Degenerative disease of nervous system, unspecified: Secondary | ICD-10-CM | POA: Diagnosis not present

## 2020-05-08 DIAGNOSIS — Z20822 Contact with and (suspected) exposure to covid-19: Secondary | ICD-10-CM | POA: Diagnosis not present

## 2020-05-08 DIAGNOSIS — I6623 Occlusion and stenosis of bilateral posterior cerebral arteries: Secondary | ICD-10-CM | POA: Diagnosis not present

## 2020-05-08 LAB — COMPREHENSIVE METABOLIC PANEL
ALT: 14 U/L (ref 0–44)
AST: 20 U/L (ref 15–41)
Albumin: 3.9 g/dL (ref 3.5–5.0)
Alkaline Phosphatase: 72 U/L (ref 38–126)
Anion gap: 8 (ref 5–15)
BUN: 28 mg/dL — ABNORMAL HIGH (ref 8–23)
CO2: 25 mmol/L (ref 22–32)
Calcium: 9.9 mg/dL (ref 8.9–10.3)
Chloride: 107 mmol/L (ref 98–111)
Creatinine, Ser: 1.08 mg/dL — ABNORMAL HIGH (ref 0.44–1.00)
GFR, Estimated: 54 mL/min — ABNORMAL LOW (ref >60)
Glucose, Bld: 90 mg/dL (ref 70–99)
Potassium: 4.9 mmol/L (ref 3.5–5.1)
Sodium: 140 mmol/L (ref 135–145)
Total Bilirubin: 0.4 mg/dL (ref 0.3–1.2)
Total Protein: 7 g/dL (ref 6.5–8.1)

## 2020-05-08 LAB — DIFFERENTIAL
Abs Immature Granulocytes: 0.02 10*3/uL (ref 0.00–0.07)
Basophils Absolute: 0 10*3/uL (ref 0.0–0.1)
Basophils Relative: 0 %
Eosinophils Absolute: 0.1 10*3/uL (ref 0.0–0.5)
Eosinophils Relative: 2 %
Immature Granulocytes: 0 %
Lymphocytes Relative: 43 %
Lymphs Abs: 3.6 10*3/uL (ref 0.7–4.0)
Monocytes Absolute: 0.7 10*3/uL (ref 0.1–1.0)
Monocytes Relative: 8 %
Neutro Abs: 3.9 10*3/uL (ref 1.7–7.7)
Neutrophils Relative %: 47 %

## 2020-05-08 LAB — CBC
HCT: 40.5 % (ref 36.0–46.0)
Hemoglobin: 13.2 g/dL (ref 12.0–15.0)
MCH: 26.7 pg (ref 26.0–34.0)
MCHC: 32.6 g/dL (ref 30.0–36.0)
MCV: 82 fL (ref 80.0–100.0)
Platelets: 372 10*3/uL (ref 150–400)
RBC: 4.94 MIL/uL (ref 3.87–5.11)
RDW: 14.4 % (ref 11.5–15.5)
WBC: 8.3 10*3/uL (ref 4.0–10.5)
nRBC: 0 % (ref 0.0–0.2)

## 2020-05-08 LAB — CBG MONITORING, ED: Glucose-Capillary: 174 mg/dL — ABNORMAL HIGH (ref 70–99)

## 2020-05-08 LAB — URINALYSIS, COMPLETE (UACMP) WITH MICROSCOPIC
Bacteria, UA: NONE SEEN
Bilirubin Urine: NEGATIVE
Glucose, UA: NEGATIVE mg/dL
Hgb urine dipstick: NEGATIVE
Ketones, ur: NEGATIVE mg/dL
Leukocytes,Ua: NEGATIVE
Nitrite: NEGATIVE
Protein, ur: NEGATIVE mg/dL
Specific Gravity, Urine: 1.018 (ref 1.005–1.030)
pH: 5 (ref 5.0–8.0)

## 2020-05-08 LAB — PROTIME-INR
INR: 0.9 (ref 0.8–1.2)
Prothrombin Time: 12.1 seconds (ref 11.4–15.2)

## 2020-05-08 LAB — RESP PANEL BY RT-PCR (FLU A&B, COVID) ARPGX2
Influenza A by PCR: NEGATIVE
Influenza B by PCR: NEGATIVE
SARS Coronavirus 2 by RT PCR: NEGATIVE

## 2020-05-08 LAB — APTT: aPTT: 28 seconds (ref 24–36)

## 2020-05-08 IMAGING — MR MR MRA HEAD W/O CM
1 series · 18 of 48 positions shown · non-contrast
Comparison: CT head without contrast [DATE]. MR head without
contrast [DATE].

CLINICAL DATA: Neuro deficit, acute, stroke suspected. Right-sided
facial droop and numbness.

EXAM:
MRI HEAD WITHOUT CONTRAST
MRA HEAD WITHOUT CONTRAST
TECHNIQUE: Multiplanar, multiecho pulse sequences of the brain and surrounding
structures were obtained without intravenous contrast. Angiographic
images of the head were obtained using MRA technique without
contrast.

[Series 1: TOF · axial · 0.5mm · 0.41mm/px · z∈[-90,+6]mm · 18 of 205 slices shown]
[im 1/205]
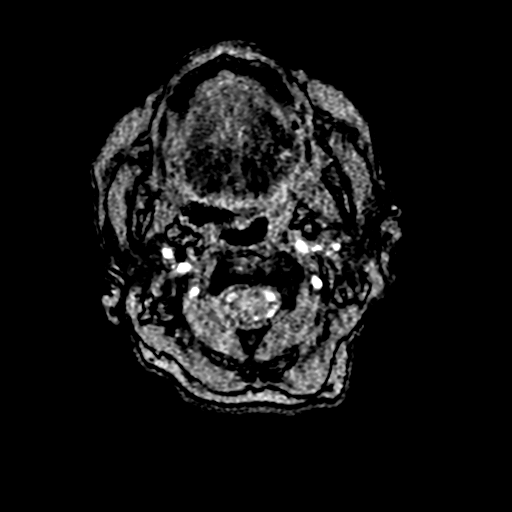
[im 5/205]
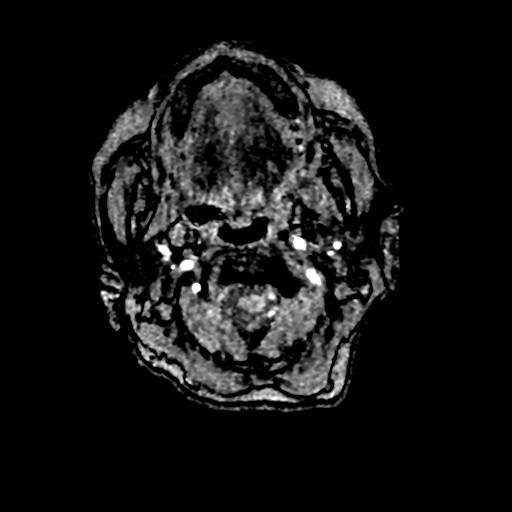
[im 9/205]
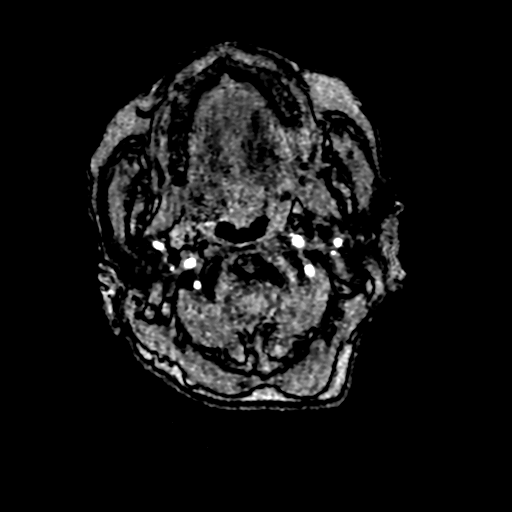
[im 14/205]
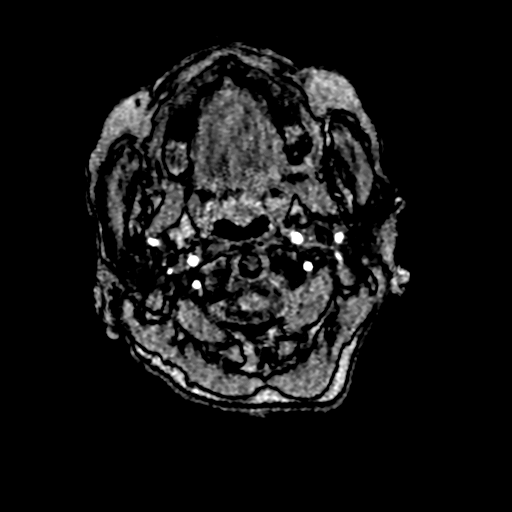
[im 18/205]
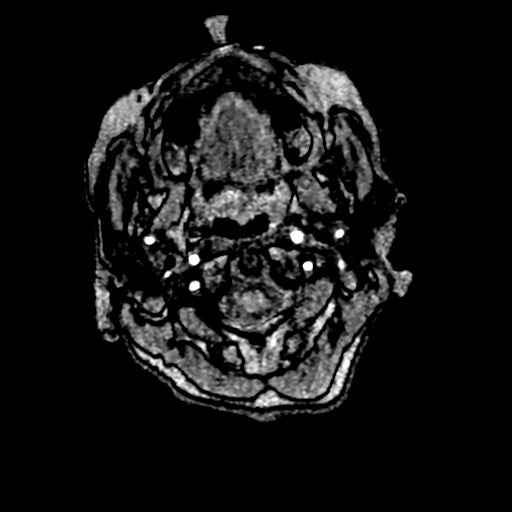
[im 22/205]
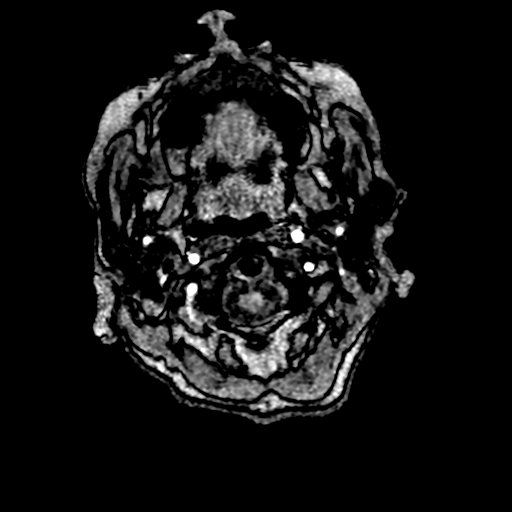
[im 27/205]
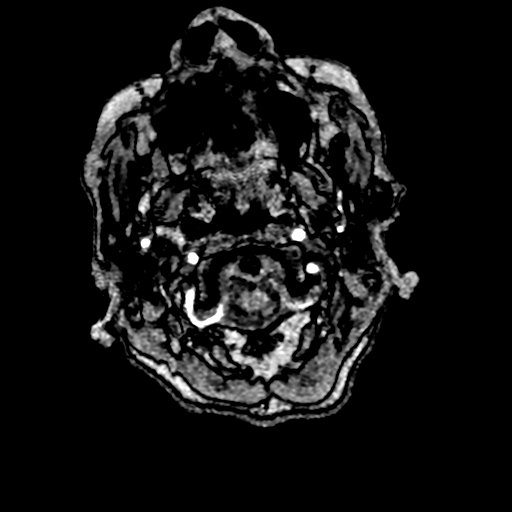
[im 31/205]
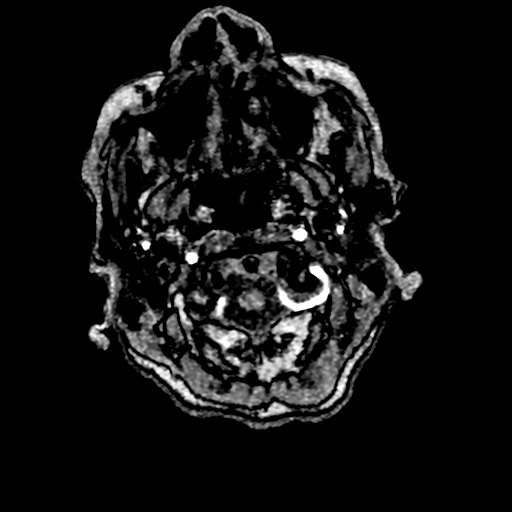
[im 35/205]
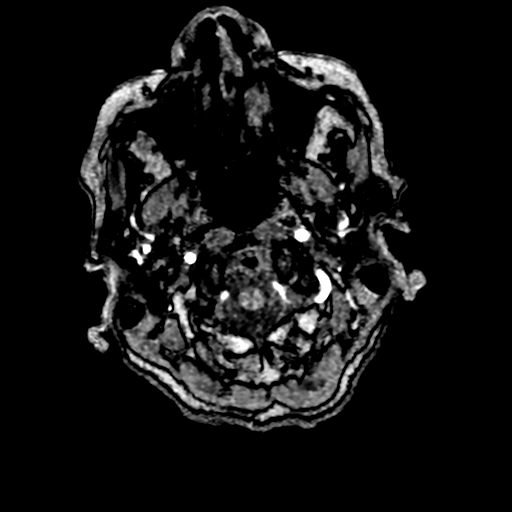
[im 40/205]
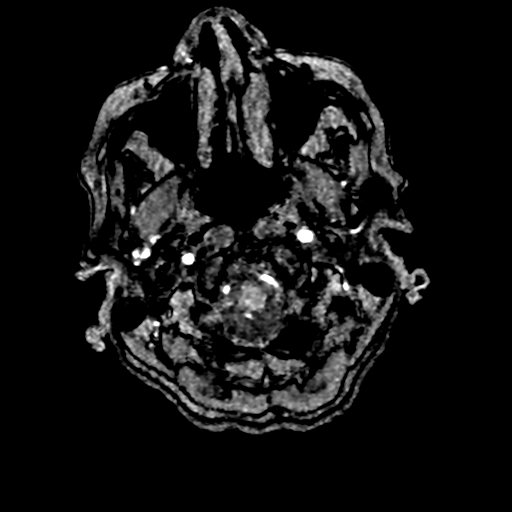
[im 66/205]
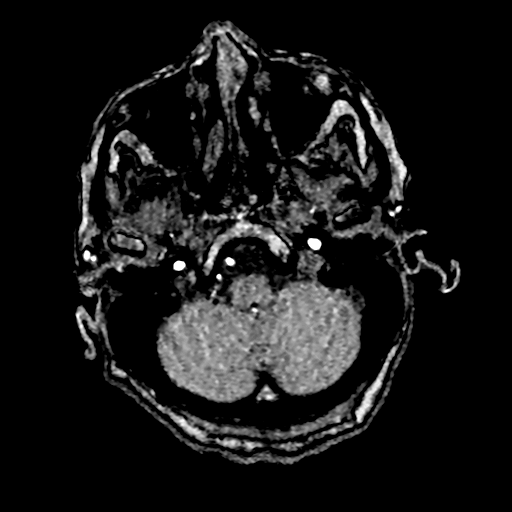
[im 92/205]
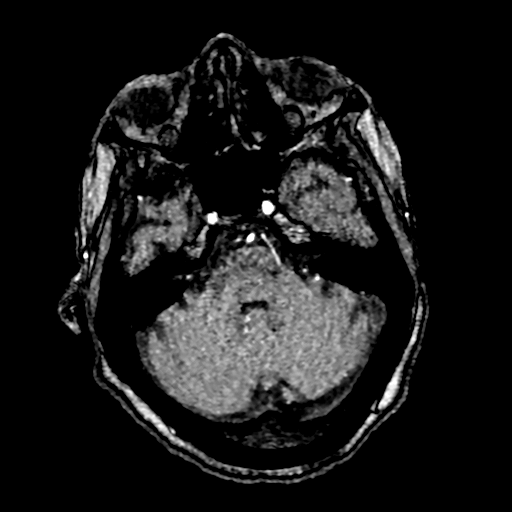
[im 105/205]
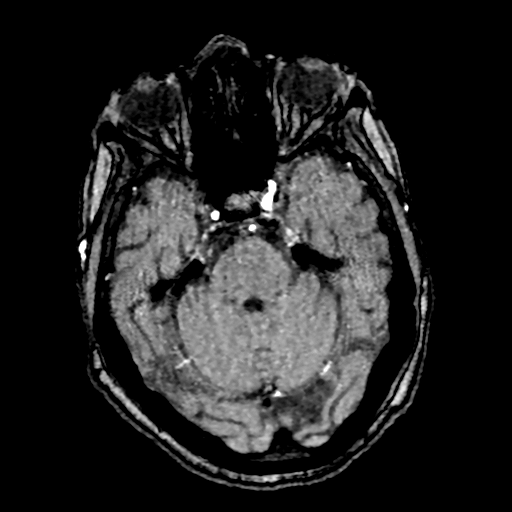
[im 118/205]
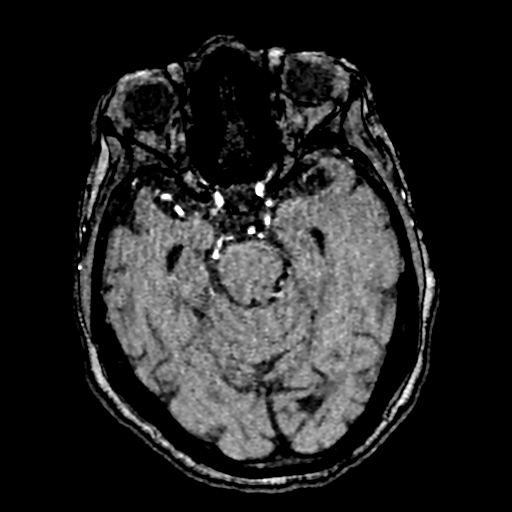
[im 144/205]
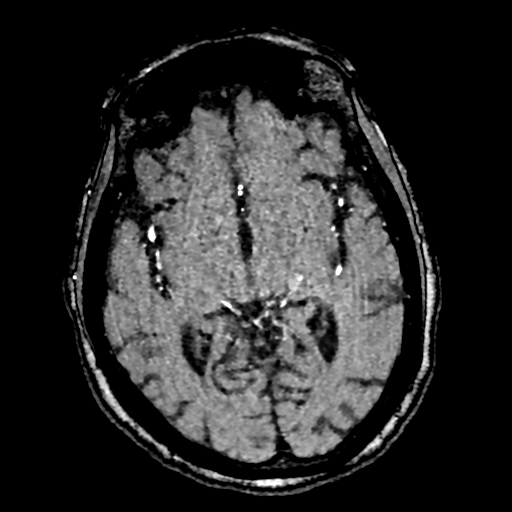
[im 170/205]
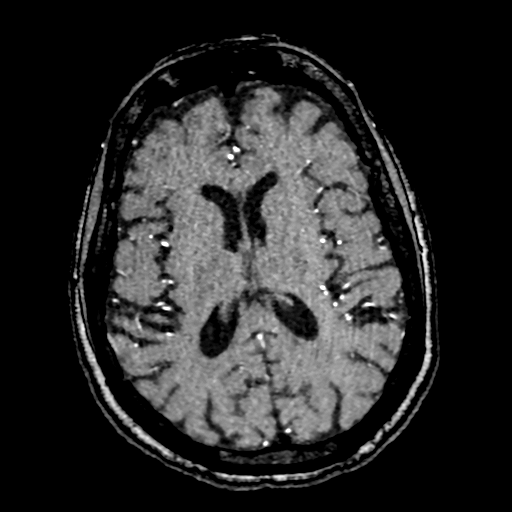
[im 174/205]
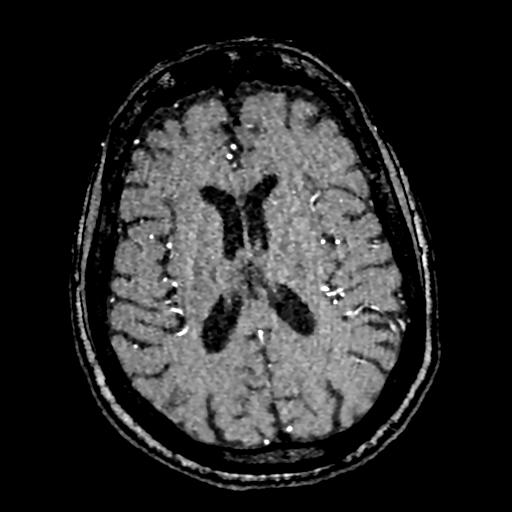
[im 196/205]
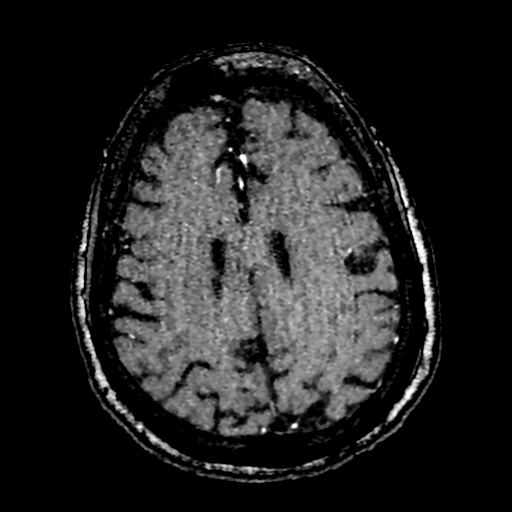

[18 of 48 positions shown; findings below may reference images not displayed]

FINDINGS: MRI HEAD FINDINGS

Brain: The diffusion-weighted images demonstrate no acute or
subacute infarction. Moderate atrophy and diffuse white matter
disease is similar the prior exam. No acute infarct, hemorrhage, or
mass lesion is present. The ventricles are of normal size. No
significant extraaxial fluid collection is present.

The internal auditory canals are within normal limits. Remote left
occipital lobe infarct again noted. Mild white matter changes extend
into the brainstem. Brainstem and cerebellum are otherwise within
normal limits.

Vascular: Flow is present in the major intracranial arteries.

Skull and upper cervical spine: The craniocervical junction is
normal. Upper cervical spine is within normal limits. Marrow signal
is unremarkable.

Sinuses/Orbits: The paranasal sinuses and mastoid air cells are
clear. The globes and orbits are within normal limits.

MRA HEAD FINDINGS

Moderate irregularity is present through the cavernous internal
carotid arteries bilaterally without a significant stenosis of
greater than 50% relative to the more distal vessels. ICA termini
are intact. A1 and M1 segments are normal. MCA bifurcations are
intact. ACA and MCA branch vessels are unremarkable.

The left vertebral artery is the dominant vessel. Left PICA origin
not discretely visualized. Right PICA origin visualized and normal.
Vertebrobasilar junction is normal. Basilar artery is normal. The
left posterior cerebral artery is of fetal type. The right posterior
cerebral artery originates from basilar tip. P2 segment narrowing is
present bilaterally. Asymmetric attenuation of left PCA branch
vessels noted.
IMPRESSION: 1. No acute intracranial abnormality or significant interval change.
2. Stable moderate atrophy and diffuse white matter disease.
3. Remote left occipital lobe infarct.
4. Moderate medium and small vessel disease bilaterally.
5. Moderate P2 segment stenoses bilaterally with asymmetric
attenuation of left PCA branch vessels which correlates with the
remote infarct.
6. Medium and distal small vessel disease without significant
proximal stenosis or occlusion in the anterior circulation.

## 2020-05-08 IMAGING — DX DG CHEST 1V PORT
1 series · 1 of 1 positions shown · non-contrast
Comparison: [DATE]

CLINICAL DATA: Weakness and cough

EXAM:
PORTABLE CHEST 1 VIEW

[chest ap]
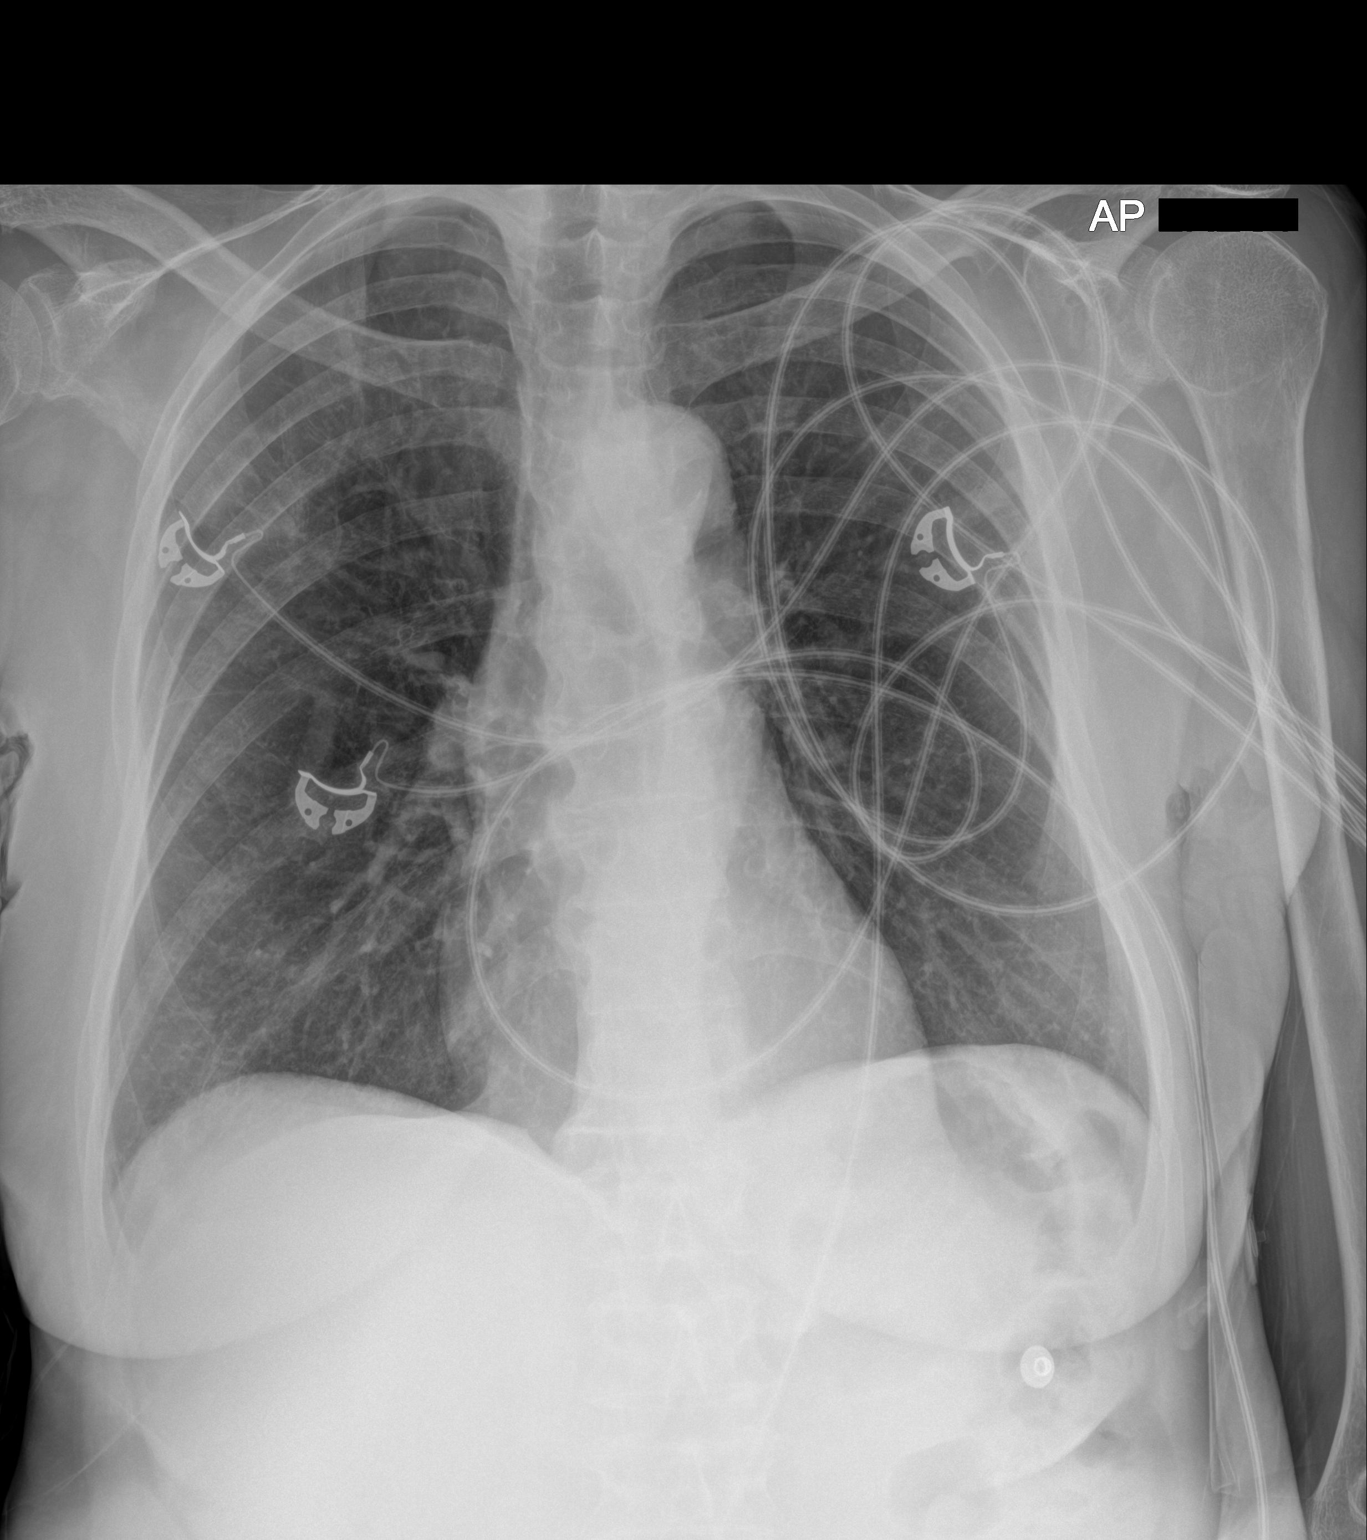

[1 of 1 positions shown; findings below may reference images not displayed]

FINDINGS: The heart size and mediastinal contours are within normal limits.
Both lungs are clear. The visualized skeletal structures are
unremarkable.
IMPRESSION: No active disease.

## 2020-05-08 IMAGING — US US CAROTID DUPLEX BILAT
1 series · 13 of 24 positions shown · non-contrast
Comparison: None.

CLINICAL DATA: Cerebrovascular accident

EXAM:
BILATERAL CAROTID DUPLEX ULTRASOUND
TECHNIQUE: Gray scale imaging, color Doppler and duplex ultrasound were
performed of bilateral carotid and vertebral arteries in the neck.

[Series 1: us carotid bilateral · 13 of 64 slices shown]
[im 1/64]
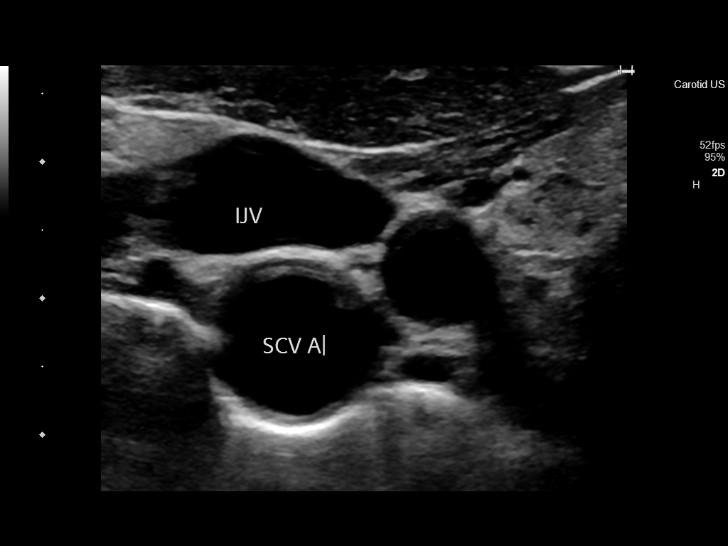
[im 6/64]
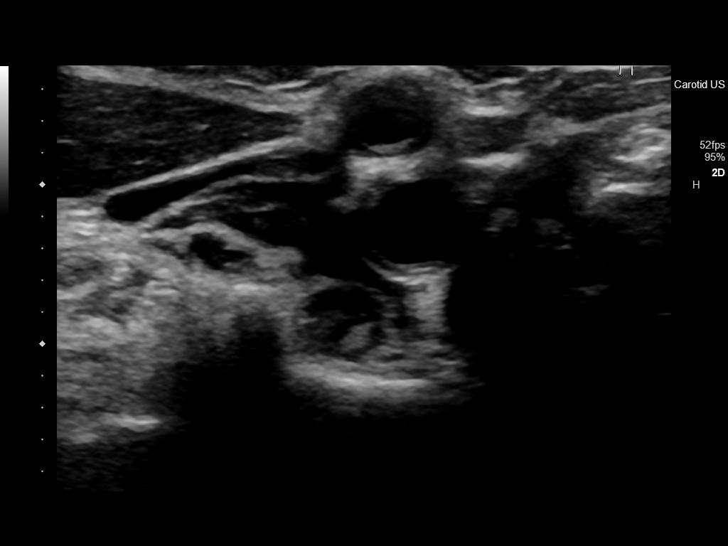
[im 11/64]
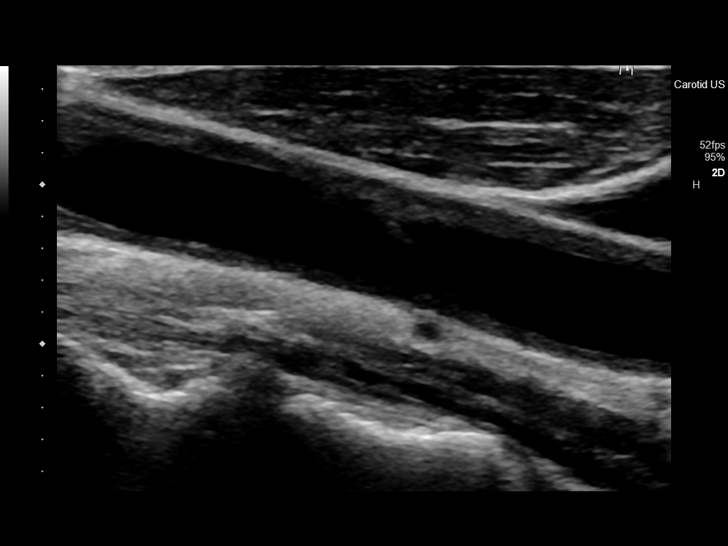
[im 17/64]
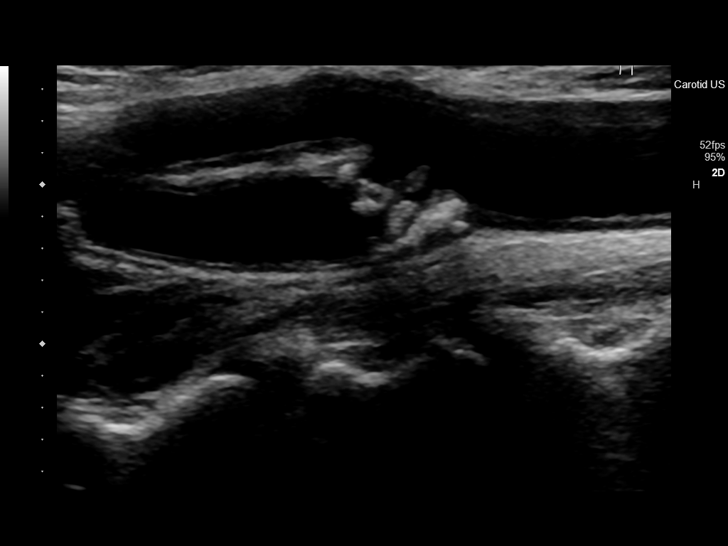
[im 22/64]
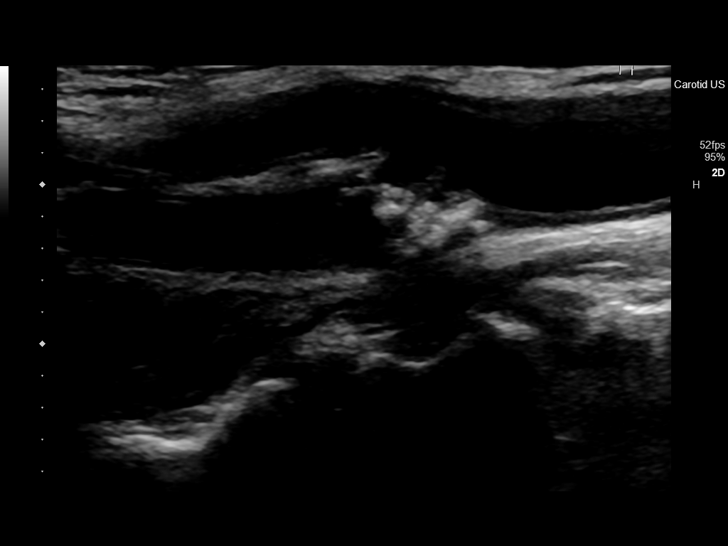
[im 28/64]
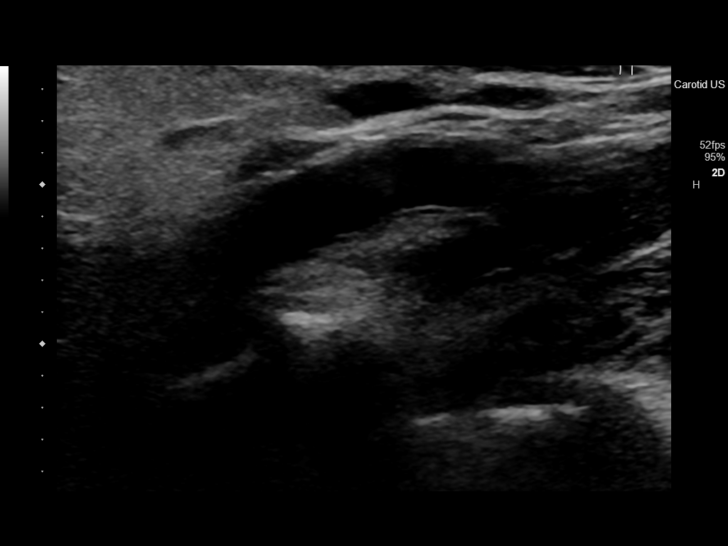
[im 33/64]
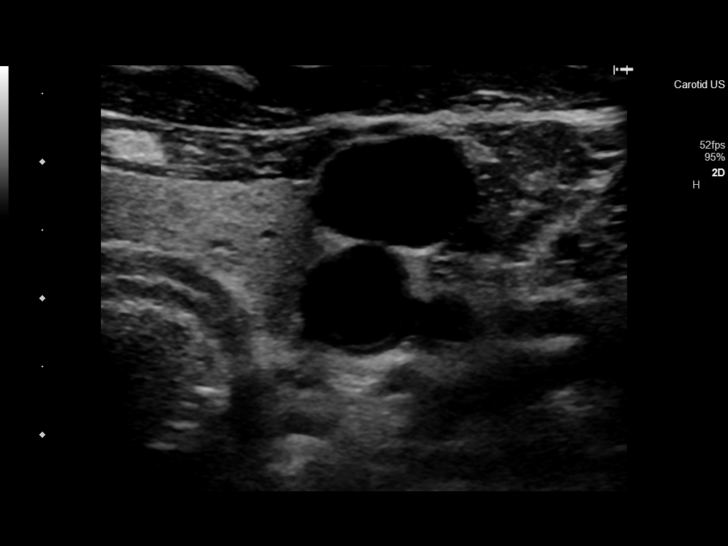
[im 36/64]
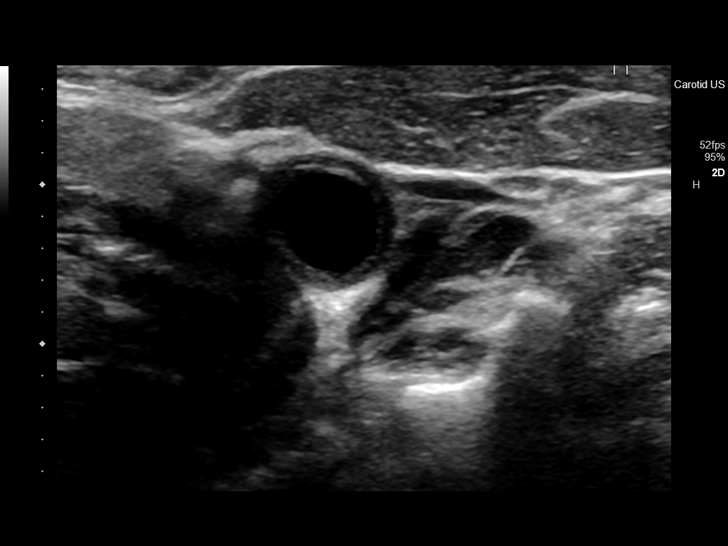
[im 42/64]
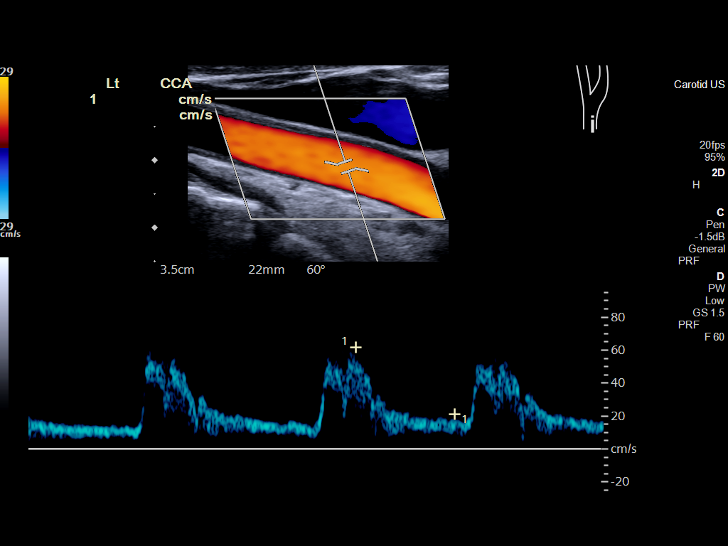
[im 47/64]
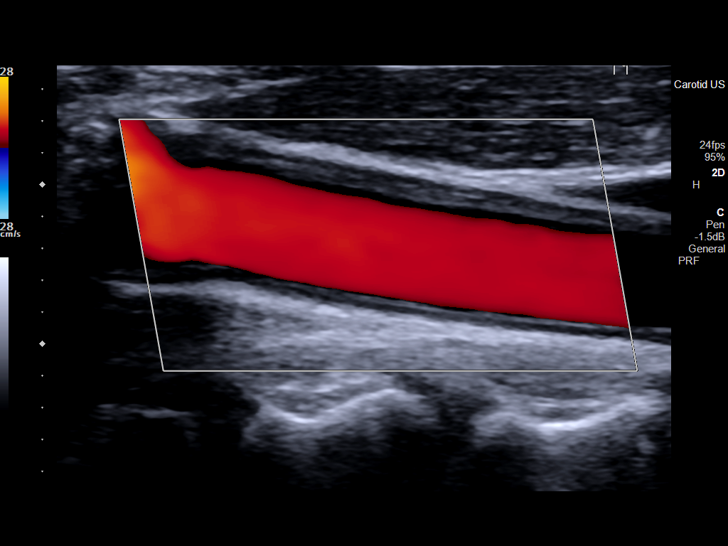
[im 53/64]
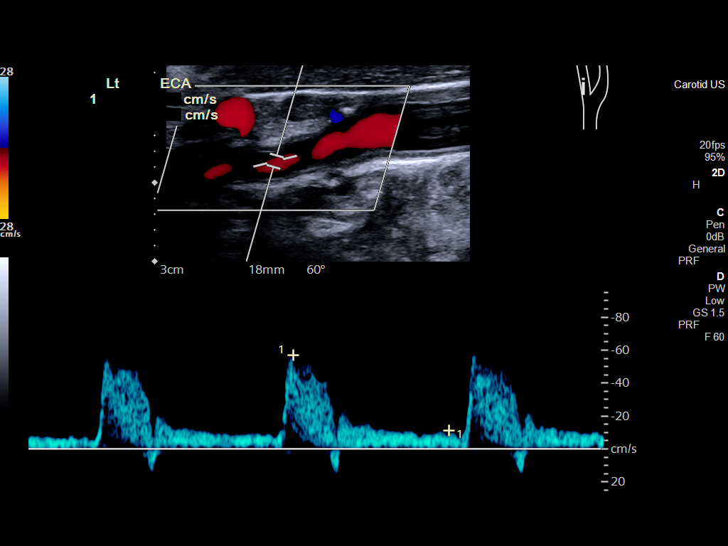
[im 58/64]
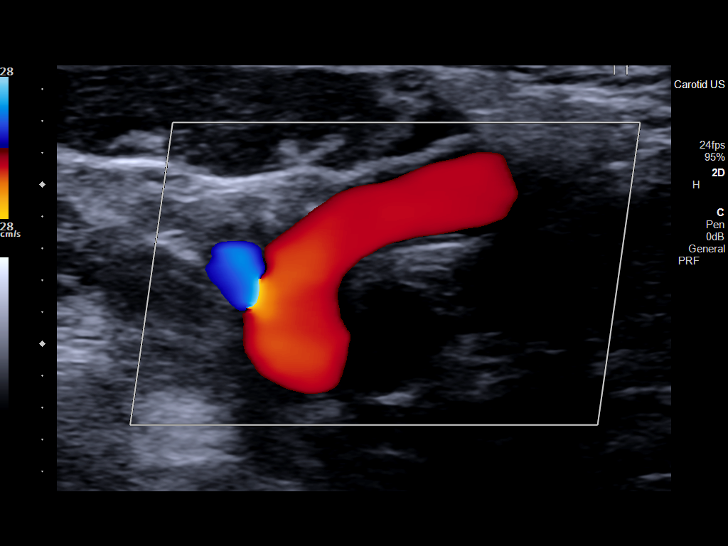
[im 64/64]
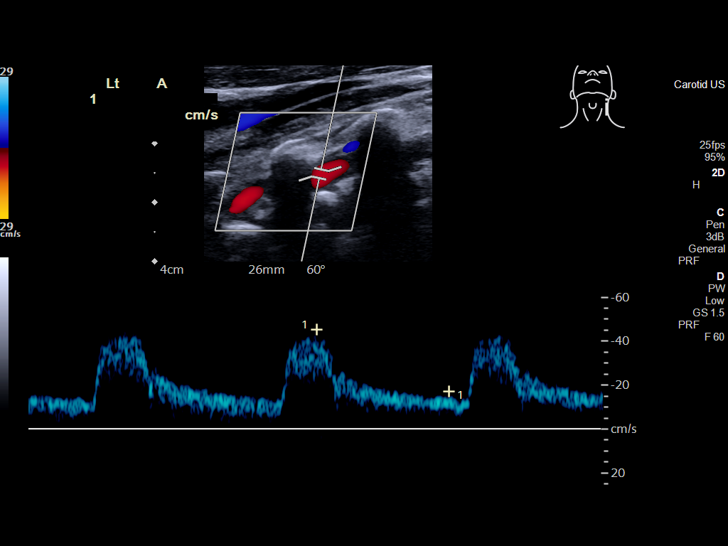

[13 of 24 positions shown; findings below may reference images not displayed]

FINDINGS: Criteria: Quantification of carotid stenosis is based on velocity
parameters that correlate the residual internal carotid diameter
with NASCET-based stenosis levels, using the diameter of the distal
internal carotid lumen as the denominator for stenosis measurement.

The following velocity measurements were obtained:

RIGHT
ICA: 63/23 cm/sec
CCA: 50/15 cm/sec

SYSTOLIC ICA/CCA RATIO:

ECA:  57 cm/sec

LEFT

ICA: 114/41 cm/sec

CCA: 60/18 cm/sec

SYSTOLIC ICA/CCA RATIO:

ECA:  57 cm/sec

RIGHT CAROTID ARTERY: Irregular heterogeneous atherosclerotic plaque
in the proximal internal carotid artery. By peak systolic velocity
criteria, the estimated stenosis is less than 50%.

RIGHT VERTEBRAL ARTERY:  Patent with normal antegrade flow.

LEFT CAROTID ARTERY: Trace heterogeneous atherosclerotic plaque in
the proximal internal carotid artery. By peak systolic velocity
criteria, the estimated stenosis remains less than 50%.

LEFT VERTEBRAL ARTERY:  Patent with normal antegrade flow.
IMPRESSION: 1. Mild (1-49%) stenosis proximal right internal carotid artery
secondary to heterogeneous and irregular/ulcerated atherosclerotic
plaque.
2. Mild (1-49%) stenosis proximal left internal carotid artery
secondary to focal heterogeneous atherosclerotic plaque.
3. Vertebral arteries are patent with normal antegrade flow.

## 2020-05-08 IMAGING — CT CT HEAD CODE STROKE
4 series · 16 of 47 positions shown, 18 images · non-contrast
Comparison: CT head without contrast [DATE]

CLINICAL DATA: Code stroke. New onset facial numbness beginning at
[DATE] today.

EXAM:
CT HEAD WITHOUT CONTRAST
TECHNIQUE: Contiguous axial images were obtained from the base of the skull
through the vertex without intravenous contrast.

[Series 3: head wo · axial · 0.45mm/px · z∈[-85,+15]mm · 7 of 28 slices shown, 9 images]
[im 4/28  brain]
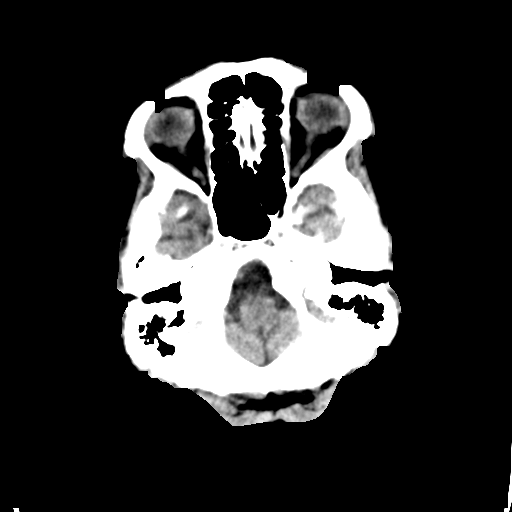
[im 4/28  bone]
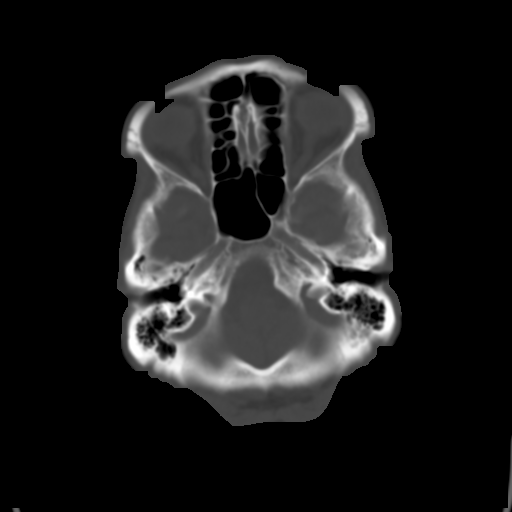
[im 7/28  brain]
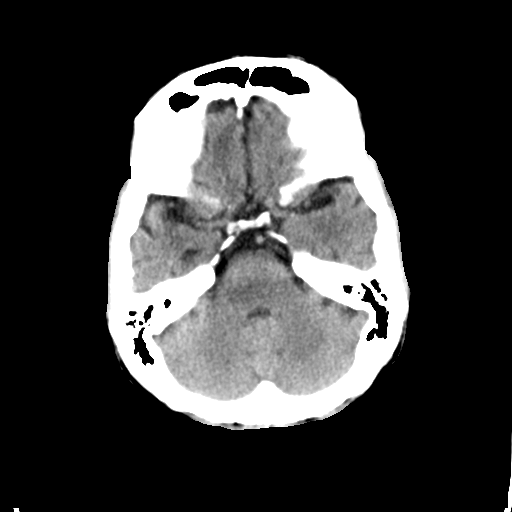
[im 11/28  brain]
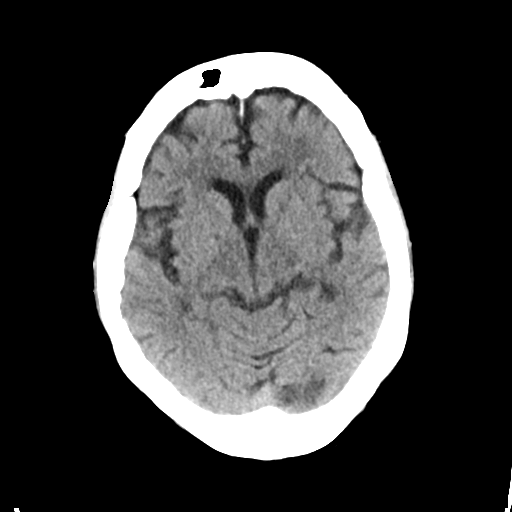
[im 14/28  brain]
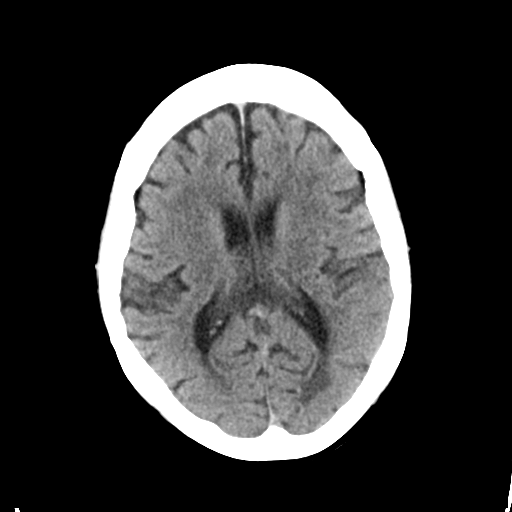
[im 17/28  brain]
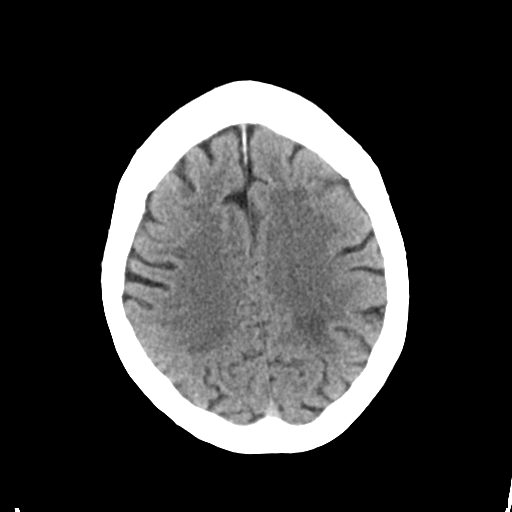
[im 17/28  bone]
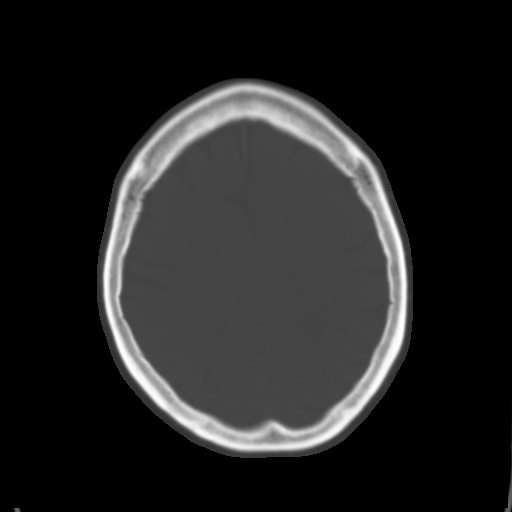
[im 21/28  brain]
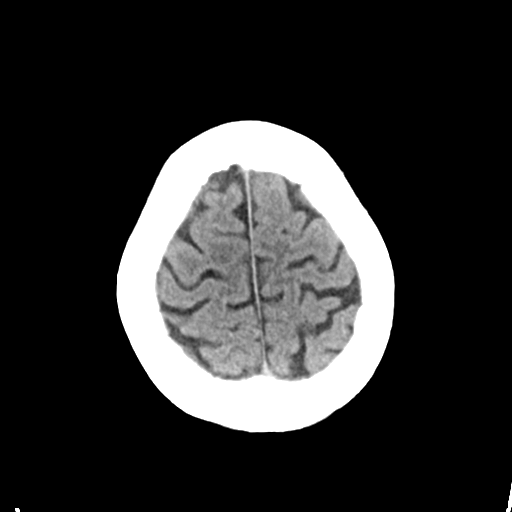
[im 24/28  brain]
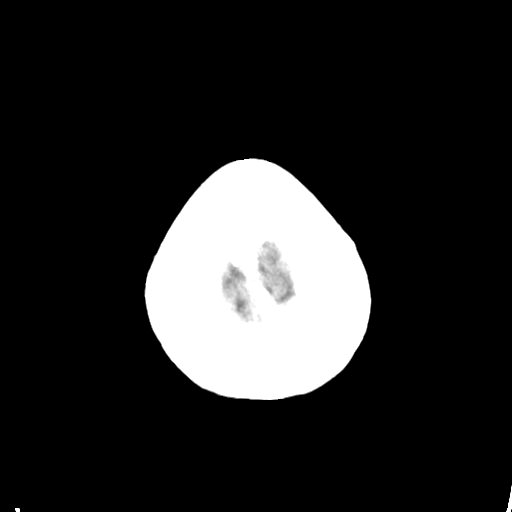

[Series 4: head bone · axial · 0.45mm/px · z∈[-88,-60]mm · 3 of 70 slices shown]
[im 7/70  bone]
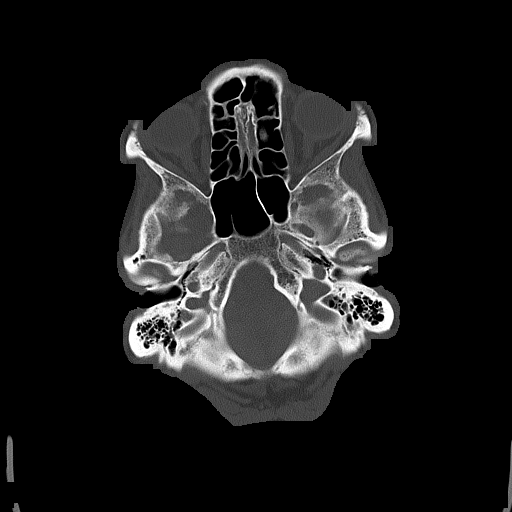
[im 14/70  bone]
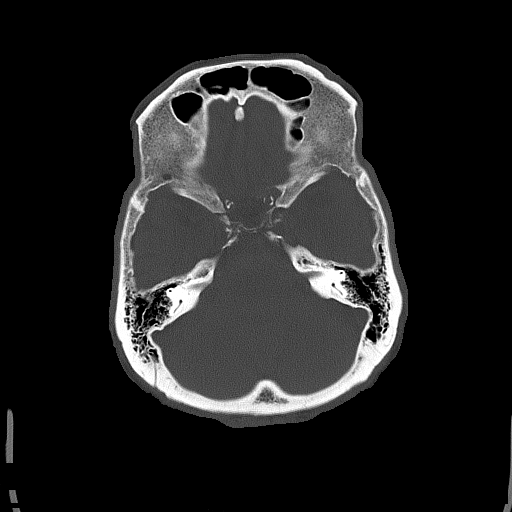
[im 21/70  bone]
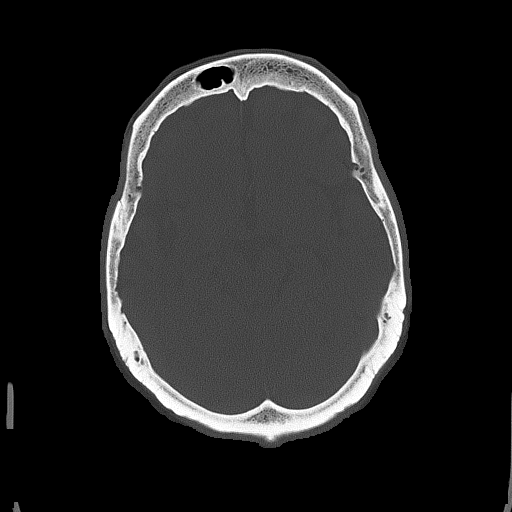

[Series 5: coronal soft tissue · coronal · 0.30mm/px · 3 of 64 slices shown]
[im 22/64  brain]
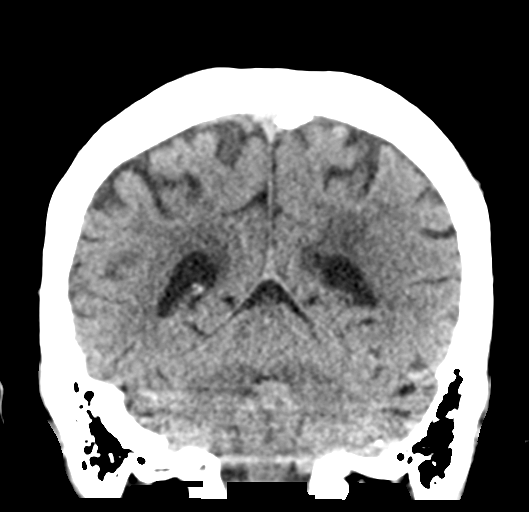
[im 29/64  brain]
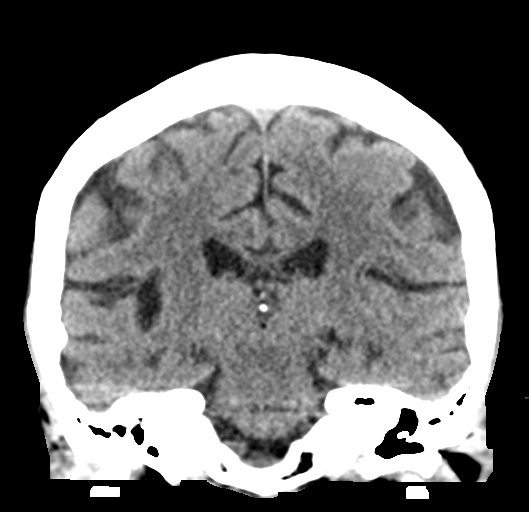
[im 36/64  brain]
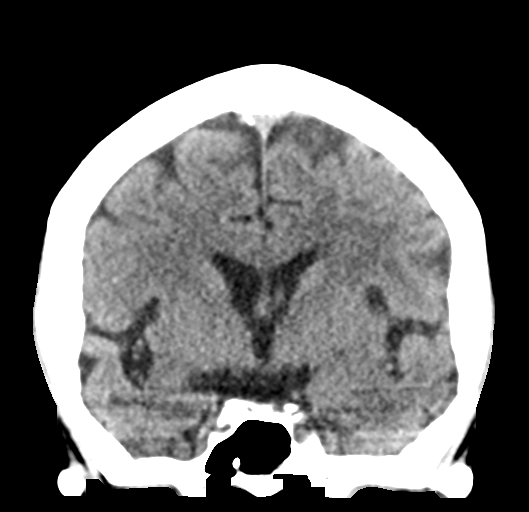

[Series 6: sagittal soft tissue · sagittal · 0.30mm/px · 3 of 53 slices shown]
[im 18/53  brain]
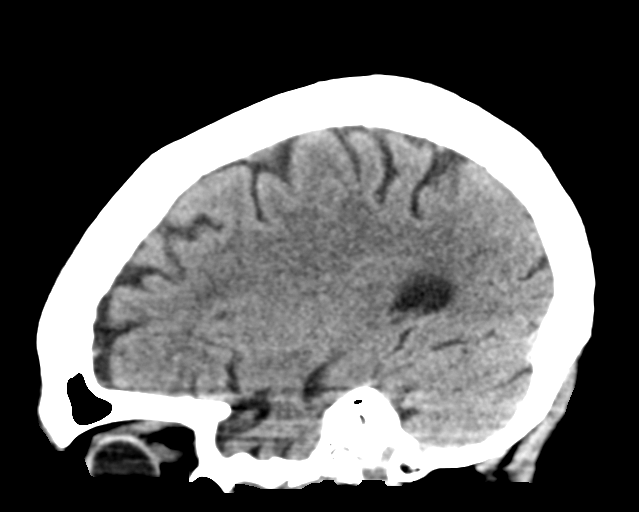
[im 27/53  brain]
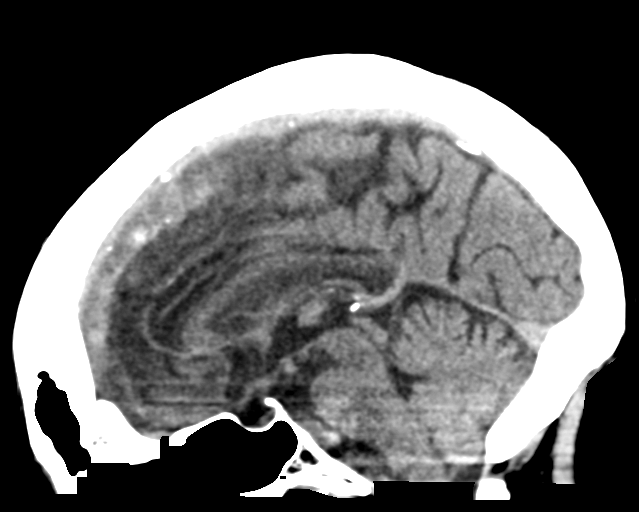
[im 35/53  brain]
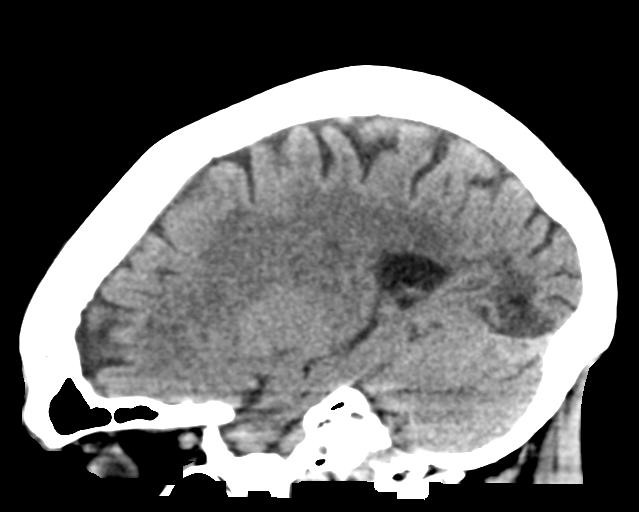

[16 of 47 positions shown; findings below may reference images not displayed]

FINDINGS: Brain: Remote left occipital lobe infarct stable. Mild atrophy and
white matter disease is unchanged. Basal ganglia are intact. Insular
ribbon is normal. No acute or focal cortical abnormality is present
otherwise.

The ventricles are of normal size. No significant extraaxial fluid
collection is present. Insert normal brainstem

Vascular: Dense atherosclerotic calcifications are again seen
cavernous internal carotid arteries bilaterally. No hyperdense
vessel is present.

Skull: Calvarium is intact. No focal lytic or blastic lesions are
present. No significant extracranial soft tissue lesion is present.

Sinuses/Orbits: The paranasal sinuses and mastoid air cells are
clear. The globes and orbits are within normal limits.

ASPECTS (Alberta Stroke Program Early CT Score)

- Ganglionic level infarction (caudate, lentiform nuclei, internal
capsule, insula, M1-M3 cortex): [DATE]

- Supraganglionic infarction (M4-M6 cortex): [DATE]

Total score (0-10 with 10 being normal): [DATE]
IMPRESSION: 1. No acute intracranial abnormality or significant interval change.
2. Stable remote left occipital lobe infarct.
3. Stable atrophy and white matter disease.

These results were called by telephone at the time of interpretation
on [DATE] at [DATE] to provider DEANDRA , who verbally
acknowledged these results.

## 2020-05-08 IMAGING — MR MR HEAD W/O CM
11 series · 48 of 48 positions shown · non-contrast
Comparison: CT head without contrast [DATE]. MR head without
contrast [DATE].

CLINICAL DATA: Neuro deficit, acute, stroke suspected. Right-sided
facial droop and numbness.

EXAM:
MRI HEAD WITHOUT CONTRAST
MRA HEAD WITHOUT CONTRAST
TECHNIQUE: Multiplanar, multiecho pulse sequences of the brain and surrounding
structures were obtained without intravenous contrast. Angiographic
images of the head were obtained using MRA technique without
contrast.

[Series 2: ax dwi_tracew · axial · 3.0mm · 0.71mm/px · z∈[-95,+68]mm · 5 of 56 slices shown]
[im 1/56]
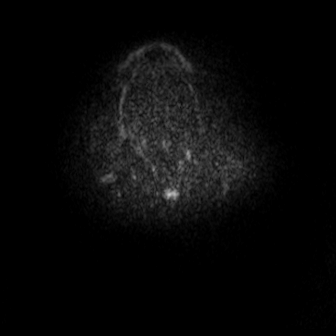
[im 14/56]
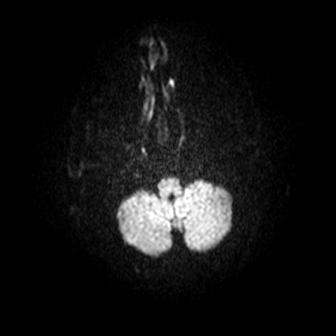
[im 28/56]
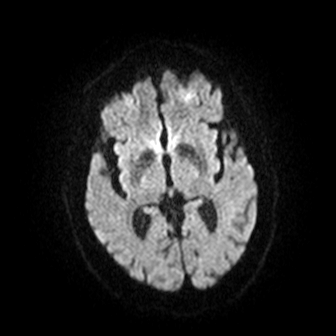
[im 42/56]
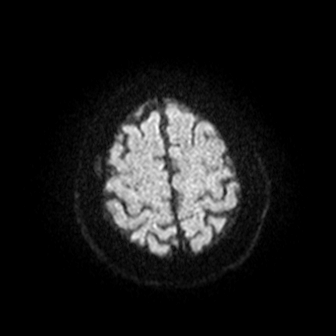
[im 56/56]
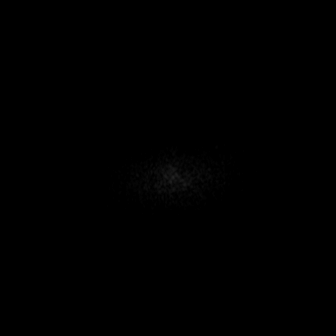

[Series 3: ax dwi_adc · axial · 3.0mm · 0.71mm/px · z∈[-95,+68]mm · 5 of 56 slices shown]
[im 1/56]
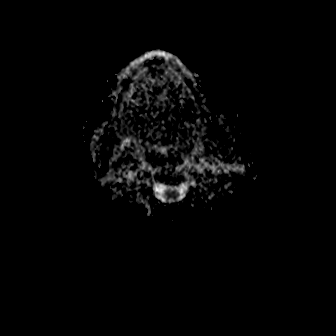
[im 14/56]
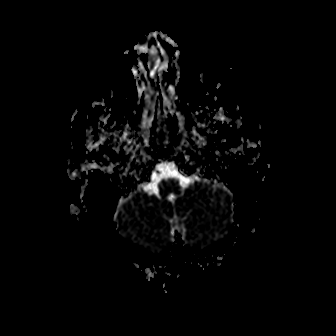
[im 28/56]
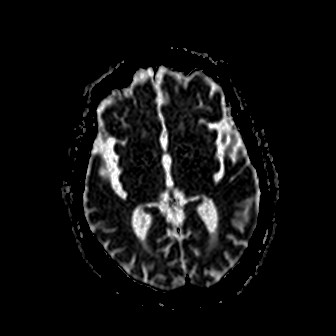
[im 42/56]
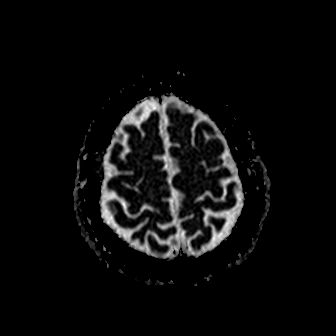
[im 56/56]
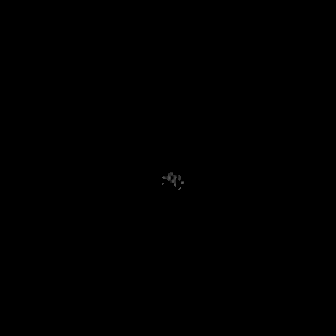

[Series 4: cor dwi_tracew · coronal · 5.0mm · 0.68mm/px · 3 of 40 slices shown]
[im 1/40]
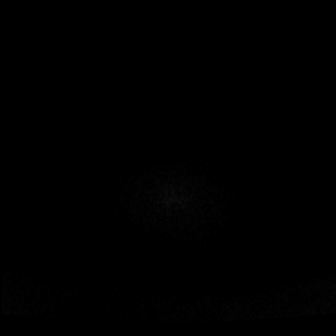
[im 20/40]
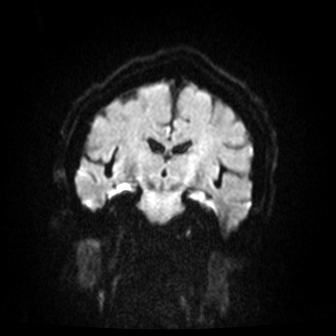
[im 40/40]
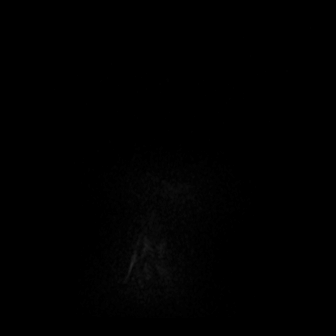

[Series 5: cor dwi_adc · coronal · 5.0mm · 0.68mm/px · 3 of 39 slices shown]
[im 1/39]
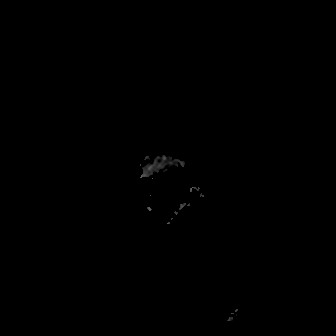
[im 20/39]
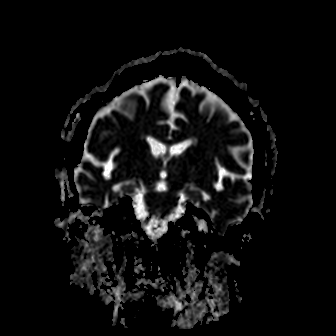
[im 39/39]
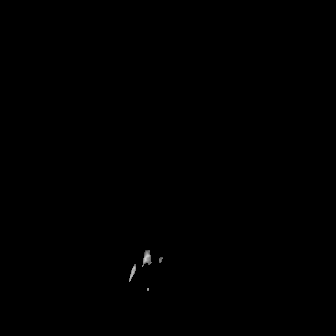

[Series 6: FLAIR · axial · 3.0mm · 0.69mm/px · z∈[-93,+68]mm · 4 of 55 slices shown]
[im 1/55]
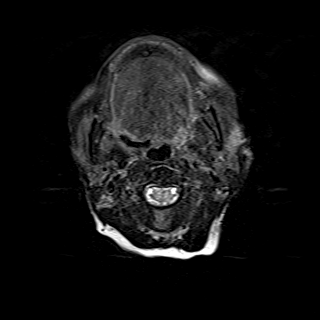
[im 19/55]
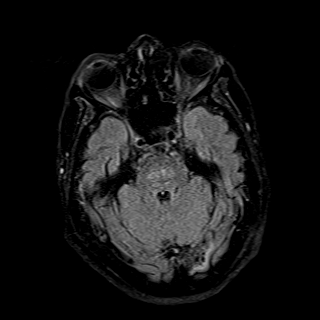
[im 37/55]
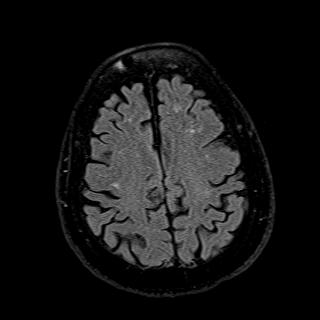
[im 55/55]
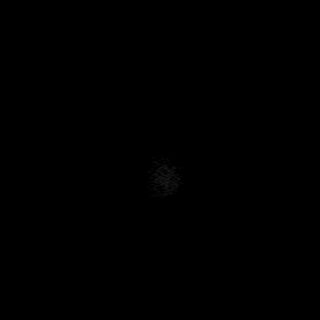

[Series 8: pha_images · axial · 3.0mm · 0.90mm/px · z∈[-89,+63]mm · 4 of 52 slices shown]
[im 1/52]
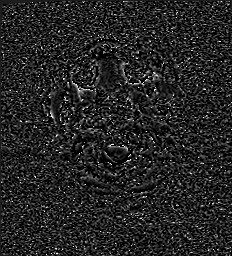
[im 18/52]
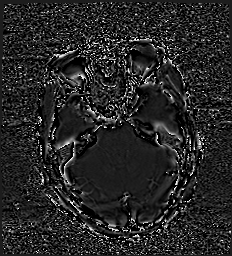
[im 35/52]
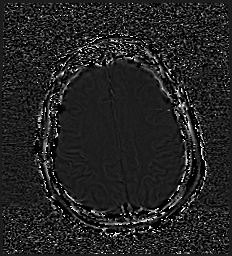
[im 52/52]
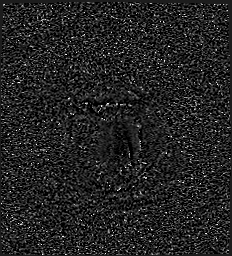

[Series 9: swi_images · axial · 3.0mm · 0.90mm/px · z∈[-89,+63]mm · 4 of 52 slices shown]
[im 1/52]
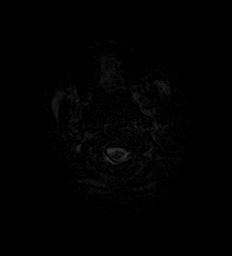
[im 18/52]
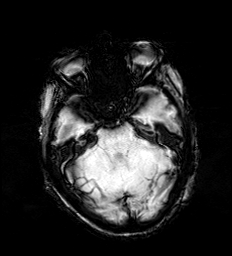
[im 35/52]
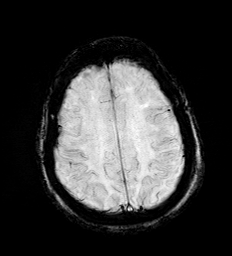
[im 52/52]
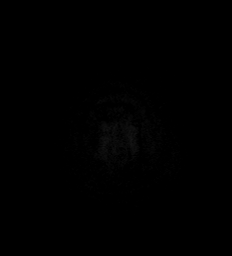

[Series 11: T2 · axial · 5.0mm · 0.86mm/px · z∈[-84,+59]mm · 2 of 25 slices shown (1 of 2)]
[im 1/25]
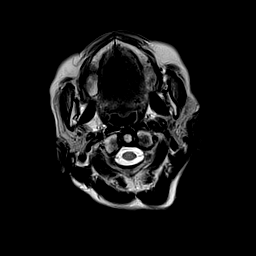
[im 25/25]
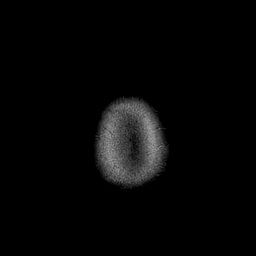

[Series 12: T1 · sagittal · 5.0mm · 0.47mm/px · 2 of 24 slices shown (1 of 2)]
[im 1/24]
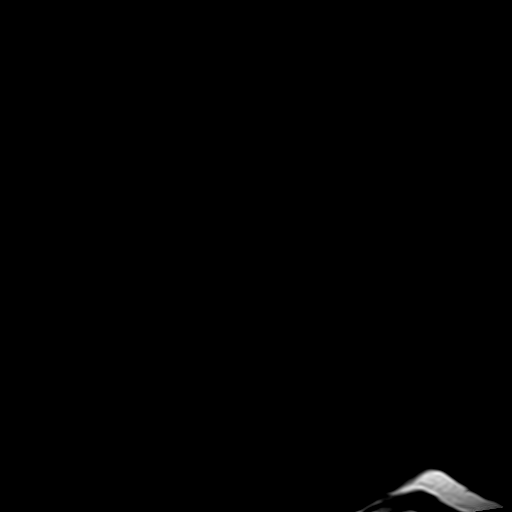
[im 24/24]
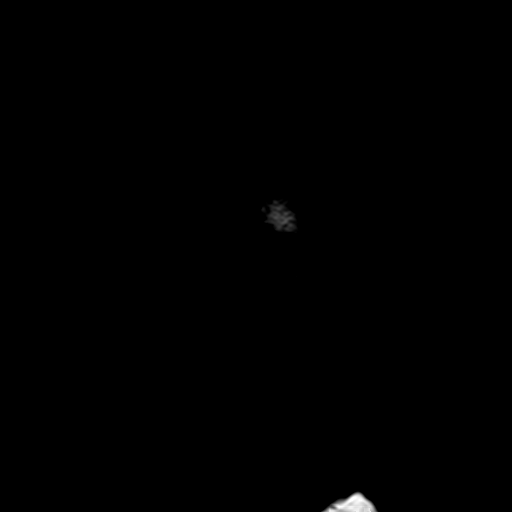

[Series 13: T1 · axial · 1.0mm · 0.98mm/px · z∈[-109,+66]mm · 14 of 176 slices shown (2 of 2)]
[im 1/176]
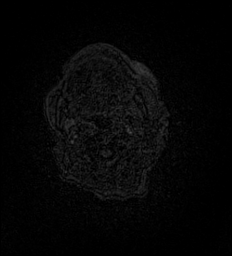
[im 14/176]
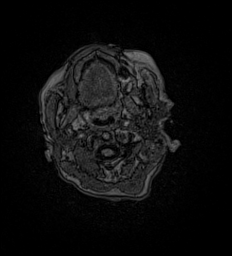
[im 27/176]
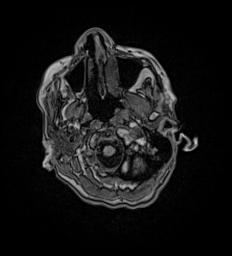
[im 41/176]
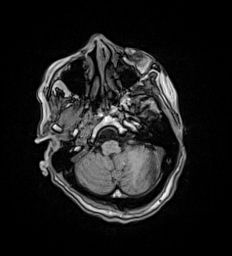
[im 54/176]
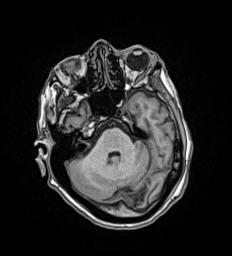
[im 68/176]
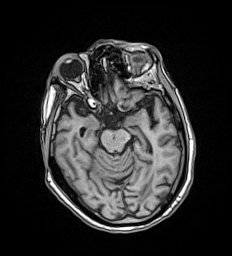
[im 81/176]
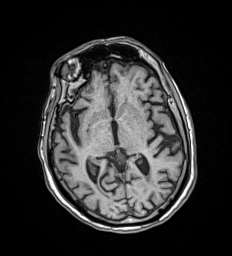
[im 95/176]
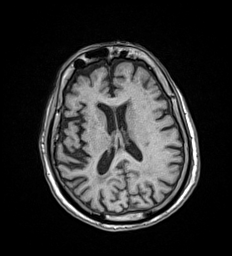
[im 108/176]
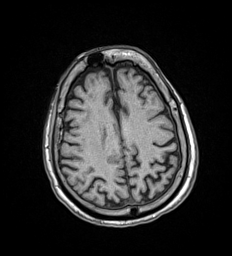
[im 122/176]
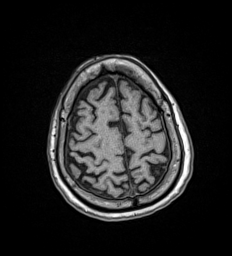
[im 135/176]
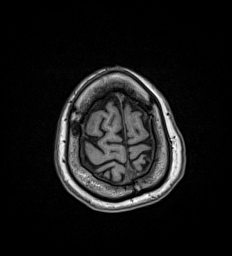
[im 149/176]
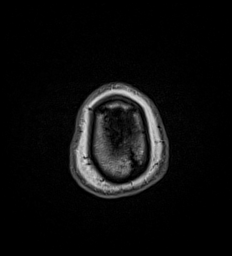
[im 162/176]
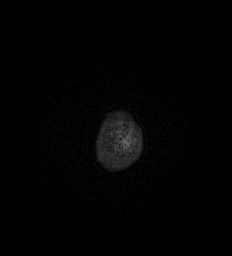
[im 176/176]
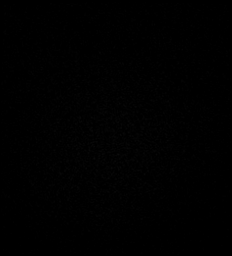

[Series 14: T2 · coronal · 5.0mm · 0.86mm/px · 2 of 30 slices shown (2 of 2)]
[im 1/30]
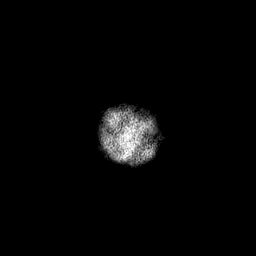
[im 30/30]
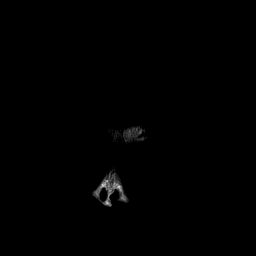

[48 of 48 positions shown; findings below may reference images not displayed]

FINDINGS: MRI HEAD FINDINGS

Brain: The diffusion-weighted images demonstrate no acute or
subacute infarction. Moderate atrophy and diffuse white matter
disease is similar the prior exam. No acute infarct, hemorrhage, or
mass lesion is present. The ventricles are of normal size. No
significant extraaxial fluid collection is present.

The internal auditory canals are within normal limits. Remote left
occipital lobe infarct again noted. Mild white matter changes extend
into the brainstem. Brainstem and cerebellum are otherwise within
normal limits.

Vascular: Flow is present in the major intracranial arteries.

Skull and upper cervical spine: The craniocervical junction is
normal. Upper cervical spine is within normal limits. Marrow signal
is unremarkable.

Sinuses/Orbits: The paranasal sinuses and mastoid air cells are
clear. The globes and orbits are within normal limits.

MRA HEAD FINDINGS

Moderate irregularity is present through the cavernous internal
carotid arteries bilaterally without a significant stenosis of
greater than 50% relative to the more distal vessels. ICA termini
are intact. A1 and M1 segments are normal. MCA bifurcations are
intact. ACA and MCA branch vessels are unremarkable.

The left vertebral artery is the dominant vessel. Left PICA origin
not discretely visualized. Right PICA origin visualized and normal.
Vertebrobasilar junction is normal. Basilar artery is normal. The
left posterior cerebral artery is of fetal type. The right posterior
cerebral artery originates from basilar tip. P2 segment narrowing is
present bilaterally. Asymmetric attenuation of left PCA branch
vessels noted.
IMPRESSION: 1. No acute intracranial abnormality or significant interval change.
2. Stable moderate atrophy and diffuse white matter disease.
3. Remote left occipital lobe infarct.
4. Moderate medium and small vessel disease bilaterally.
5. Moderate P2 segment stenoses bilaterally with asymmetric
attenuation of left PCA branch vessels which correlates with the
remote infarct.
6. Medium and distal small vessel disease without significant
proximal stenosis or occlusion in the anterior circulation.

## 2020-05-08 MED ORDER — ACETAMINOPHEN 650 MG RE SUPP: 650.0000 mg | RECTAL | Status: DC | PRN

## 2020-05-08 MED ORDER — ACETAMINOPHEN 325 MG PO TABS: 650.0000 mg | ORAL_TABLET | ORAL | Status: DC | PRN

## 2020-05-08 MED ORDER — SENNOSIDES-DOCUSATE SODIUM 8.6-50 MG PO TABS: 1.0000 | ORAL_TABLET | Freq: Every evening | ORAL | Status: DC | PRN

## 2020-05-08 MED ORDER — STROKE: EARLY STAGES OF RECOVERY BOOK: Freq: Once | Status: DC

## 2020-05-08 MED ORDER — ENOXAPARIN SODIUM 40 MG/0.4ML IJ SOSY: 40.0000 mg | PREFILLED_SYRINGE | INTRAMUSCULAR | Status: DC

## 2020-05-08 MED ORDER — THIAMINE HCL 100 MG PO TABS
100.0000 mg | ORAL_TABLET | Freq: Every day | ORAL | Status: DC
  Administered 2020-05-08 – 2020-05-09 (×2): 100 mg via ORAL
  Filled 2020-05-08 (×2): qty 1

## 2020-05-08 MED ORDER — ENOXAPARIN SODIUM 30 MG/0.3ML IJ SOSY
30.0000 mg | PREFILLED_SYRINGE | INTRAMUSCULAR | Status: DC
  Administered 2020-05-08: 21:00:00 30 mg via SUBCUTANEOUS
  Filled 2020-05-08 (×2): qty 0.3

## 2020-05-08 MED ORDER — LABETALOL HCL 5 MG/ML IV SOLN: 5.0000 mg | INTRAVENOUS | Status: DC | PRN

## 2020-05-08 MED ORDER — ACETAMINOPHEN 160 MG/5ML PO SOLN
650.0000 mg | ORAL | Status: DC | PRN
  Filled 2020-05-08: qty 20.3

## 2020-05-08 NOTE — ED Notes (Signed)
Patient ambulatory to and from restroom with steady gait.

## 2020-05-08 NOTE — Progress Notes (Signed)
  Chaplain On-Call responded to Code Stroke notification for ED room 11.  The patient was being attended by several medical team members.  Chaplain will be available as needed for spiritual support.  Chaplain Evelena Peat M.Div., [facility]

## 2020-05-08 NOTE — ED Notes (Signed)
Called code stroke to [facility] [phone]

## 2020-05-08 NOTE — Progress Notes (Signed)
CODE STROKE- PHARMACY COMMUNICATION   Time CODE STROKE called/page received:1603  Time response to CODE STROKE was made (in person or via phone): 1610  Time Stroke Kit retrieved from [place] (only if needed):1610  Name of Provider/Nurse contacted:Dr. Duffy Bruce   Past Medical History:  Diagnosis Date  . Blood transfusion without reported diagnosis   . Depression   . Neuromuscular disorder ([place])    Prior to Admission medications   Medication Sig Start Date End Date Taking? Authorizing Provider  amLODipine (NORVASC) 5 MG tablet Take 1 tablet (5 mg total) by mouth daily. 01/24/20   Rama, Venetia Maxon, MD  amoxicillin (AMOXIL) 500 MG capsule Take 2 capsules (1,000 mg total) by mouth every 12 (twelve) hours. Patient taking differently: Take 1,000 mg by mouth every 12 (twelve) hours. Start date : 01/24/20 01/23/20   Rama, Venetia Maxon, MD  Calcium-Vit D-Arg-Inos-Silicon (BONE DENSITY) 300-200 MG-UNIT TABS Take 1 tablet by mouth 2 (two) times daily. Patient not taking: No sig reported 12/02/12   Roselee Culver, MD  clarithromycin (BIAXIN) 500 MG tablet Take 1 tablet (500 mg total) by mouth every 12 (twelve) hours. Patient taking differently: Take 500 mg by mouth every 12 (twelve) hours. Start date :01/24/20 01/23/20   Rama, Venetia Maxon, MD  folic acid (FOLVITE) 1 MG tablet Take 1 tablet (1 mg total) by mouth daily. 01/24/20   Rama, Venetia Maxon, MD  lansoprazole (PREVACID) 30 MG capsule Take 1 capsule (30 mg total) by mouth daily at 12 noon. 01/23/20   Rama, Venetia Maxon, MD  lidocaine (LIDODERM) 5 % Place 1 patch onto the skin daily. Remove & Discard patch within 12 hours or as directed by MD Patient not taking: No sig reported 01/24/20   Rama, Venetia Maxon, MD  pantoprazole (PROTONIX) 40 MG tablet Take 1 tablet (40 mg total) by mouth 2 (two) times daily. 01/23/20   Rama, Venetia Maxon, MD  thiamine 100 MG tablet Take 1 tablet (100 mg total) by mouth daily. 01/24/20   Rama, Venetia Maxon, MD     Pernell Dupre ,PharmD Clinical Pharmacist  05/08/2020  4:25 PM

## 2020-05-08 NOTE — Progress Notes (Signed)
PHARMACIST - PHYSICIAN COMMUNICATION  CONCERNING:  Enoxaparin (Lovenox) for DVT Prophylaxis    RECOMMENDATION: Patient was prescribed enoxaprin 40mg  q24 hours for VTE prophylaxis.   Filed Weights   05/08/20 1622  Weight: 40 kg (88 lb 2.9 oz)    Body mass index is 15.62 kg/m.  Estimated Creatinine Clearance: 28.4 mL/min (A) (by C-G formula based on SCr of 1.08 mg/dL (H)).   Patient is candidate for enoxaparin 30mg  every 24 hours based on CrCl <81ml/min or Weight <45kg  DESCRIPTION: Pharmacy has adjusted enoxaparin dose per [facility] policy.  Patient is now receiving enoxaparin 30 mg every 24 hours    31m, PharmD Clinical Pharmacist  05/08/2020 6:18 PM

## 2020-05-08 NOTE — ED Triage Notes (Signed)
Patient presents to ER from home. Patients daughter reports she noticed right side facial droop and numbness to right side of face at 1530 today. Patient noted to have equal grip strength. Patient noted to have no facial droop at this time. Patient A&OX2. Per daughter patient has hx of dementia.

## 2020-05-08 NOTE — ED Notes (Signed)
Patient headed to CT at this time

## 2020-05-08 NOTE — ED Notes (Signed)
Spoke to Dr. Larinda Buttery who confirmed to call code stroke if droop present. Amber, RN confirmed droop. Code stroke called.

## 2020-05-08 NOTE — ED Notes (Signed)
MRI completed checklist with family at bedside

## 2020-05-08 NOTE — ED Notes (Signed)
Patient transported to MRI at this time. 

## 2020-05-08 NOTE — ED Notes (Signed)
Neurologist arrived to CT

## 2020-05-08 NOTE — ED Notes (Signed)
Patient returned from MRI at this time.  

## 2020-05-08 NOTE — ED Notes (Signed)
Neurology at bedside to complete full exam, was seen in CT scan prior for initial exam.

## 2020-05-08 NOTE — Consult Note (Signed)
Neurology Consultation Reason for Consult: Code stroke Requesting Physician: Amy Cox  CC: Facial numbness   History is obtained from: Patient and family and chart review   HPI: Tracy Barton is a 75 y.o. female with a past medical history significant for tobacco use, alcohol use, recent COVID-19 infection (January 2022), malnutrition, perforated ulcer 01/10/2020, hepatitis C, gastritis/gastroduodenitis, recent mild pancreatitis.   Home with her daughter around 3:30 PM was talking normally and then started to complain of perioral numbness with the daughter noticing a right facial droop.  The stroke that essentially resolved at the time of her arrival to the hospital although she did continue to complain of some numbness in the face.  Patient subsequently denies any recent signs/symptoms of bleeding.  Daughter does note some concern for memory issues especially since the patient's COVID-19 infection.  They deny any recent signs or symptoms of infection, any other recent focal neurological issues, and reported that this is never happened before.  Daughter does feel that the facial droop has essentially resolved by the time of my evaluation though she does states at times her mother's face still does not look "quite right." patient denies any shortness of breath, swelling in her arms or legs, noticing an irregular heart rate, etc.  Monitor shows atrial fibrillation and this is not a previously known diagnosis  LKW: 3:30 PM tPA given?: No, symptoms minor/rapidly resolving  ROS: All other review of systems was negative except as noted in the HPI.  Past Medical History:  Diagnosis Date  . Blood transfusion without reported diagnosis   . Depression   . Neuromuscular disorder [facility])    Past Surgical History:  Procedure Laterality Date  . BRAIN SURGERY    . LAPAROSCOPY N/A 01/10/2020   Procedure: LAPAROSCOPY DIAGNOSTIC laparoscopic washout omental patch splenectomy;  Surgeon: Karie Soda, MD;   Location: [facility]S;  Service: General;  Laterality: N/A;  . LAPAROTOMY N/A 01/10/2020   Procedure: EXPLORATORY LAPAROTOMY;  Surgeon: Karie Soda, MD;  Location: [facility] ORS;  Service: General;  Laterality: N/A;  . TUBAL LIGATION     Current Outpatient Medications  Medication Instructions  . amLODipine (NORVASC) 5 mg, Oral, Daily  . amoxicillin (AMOXIL) 1,000 mg, Oral, Every 12 hours  . Calcium-Vit D-Arg-Inos-Silicon (BONE DENSITY) 300-200 MG-UNIT TABS 1 tablet, Oral, 2 times daily  . clarithromycin (BIAXIN) 500 mg, Oral, Every 12 hours  . folic acid (FOLVITE) 1 mg, Oral, Daily  . lansoprazole (PREVACID) 30 mg, Oral, Daily  . lidocaine (LIDODERM) 5 % 1 patch, Transdermal, Every 24 hours, Remove & Discard patch within 12 hours or as directed by MD  . pantoprazole (PROTONIX) 40 mg, Oral, 2 times daily  . thiamine 100 mg, Oral, Daily     Family History  Problem Relation Age of Onset  . Stroke Mother   . Cancer Mother   . Cancer Brother   . Mental illness Maternal Grandmother    Social History:  reports that she has been smoking. She has a 26.50 pack-year smoking history. She has never used smokeless tobacco. She reports current alcohol use of about 2.0 standard drinks of alcohol per week. She reports that she does not use drugs.   Exam: Current vital signs: BP 136/63 (BP Location: Right Arm)   Pulse 68   Temp 98.1 F (36.7 C) (Oral)   Resp 18   SpO2 97%  Vital signs in last 24 hours: Temp:  [98.1 F (36.7 C)] 98.1 F (36.7 C) (05/07 1559) Pulse Rate:  [  68] 68 (05/07 1559) Resp:  [18] 18 (05/07 1559) BP: (136)/(63) 136/63 (05/07 1559) SpO2:  [97 %] 97 % (05/07 1559)   Physical Exam  Constitutional: Appears quite thin.  Psych: Affect appropriate to situation, calm and cooperative Eyes: No scleral injection HENT: No oropharyngeal obstruction.  MSK: no joint deformities.  Cardiovascular: Irregularly irregular rhythm Respiratory: Effort normal, non-labored breathing GI: Soft.   No distension. There is no tenderness.  Skin: Warm dry and intact visible skin  Neuro: Mental Status: Patient is awake, alert, oriented to person, but reports it is November and that she is 70 (which daughter states is correct although in our chart it reports the patient is currently 10) Patient is able to give limited history, frequently answering very vaguely No signs of aphasia or neglect Cranial Nerves: II: Visual Fields are full.  III,IV, VI: EOMI without ptosis or diploplia.  V: Facial sensation is reduced in the right face and V3 VII: Facial movement is symmetric on activation, family questions possible very slight droop at rest on the right.  VIII: hearing is intact to voice X: Uvula elevates symmetrically XI: Shoulder shrug is symmetric. XII: tongue is midline without atrophy or fasciculations.  Motor: Tone is normal. Bulk is normal.  There is no pronator drift of the bilateral upper extremities.  She can maintain bilateral lower extremities antigravity for at least 5 seconds.  On confrontational testing there is no focal weakness throughout Sensory: Sensation is symmetric to light touch and temperature in the arms and legs Deep Tendon Reflexes: 2+ and symmetric in the biceps and patellae.  Cerebellar: FNF and HKS are intact bilaterally   NIHSS total 2 Score breakdown: Did not answer age or month correctly    I have reviewed labs in epic and the results pertinent to this consultation are:  Basic Metabolic Panel: Recent Labs  Lab 05/08/20 1623  NA 140  K 4.9  CL 107  CO2 25  GLUCOSE 90  BUN 28*  CREATININE 1.08*  CALCIUM 9.9    CBC: Recent Labs  Lab 05/08/20 1623  WBC 8.3  NEUTROABS 3.9  HGB 13.2  HCT 40.5  MCV 82.0  PLT 372    Coagulation Studies: Recent Labs    05/08/20 1623  LABPROT 12.1  INR 0.9    Lab Results  Component Value Date   VITAMINB12 483 01/19/2020      I have reviewed the images obtained:  Head CT without acute  intracranial process MRI brain without acute intracranial process MRA without large vessel occlusion  Impression: 75 year old woman with past medical history significant for hypertension, alcohol use disorder, prior gastric ulcer presenting with new onset atrial fibrillation and transient right facial droop as well as possible numbness which is now resolving.  Given minimal deficits and recent ulcer and concern for potential ongoing alcohol use, risks of tPA would outweigh the benefit.  However for stroke prevention recommend initiation of anticoagulation, which given her significant gastric history is safest to do in a monitored setting.  She will also need completion of TIA work-up to ensure all stroke risk factors are optimized  Recommendations:  # TIA  - HgbA1c, fasting lipid panel - MRI brain completed as above - MRA of the brain without contrast completed as above - Frequent neuro checks - Echocardiogram - Carotid dopplers - Anticoagulation, agent choice per primary team - Risk factor modification, diet, exercise, smoking cessation counseling - Telemetry monitoring;  - Blood pressure goal   - Normotension   #  Memory loss -B12, TSH, RPR, HIV for work-up of reversible causes -Continue home thiamine 100 mg daily given known alcohol use history -Continue planned outpatient follow-up per family   Brooke Dare MD-PhD [facility] [phone] [facility] coverage for [facility] is from 8 AM to 4 AM in-house and 4 PM to 8 PM by telephone/video. 8 PM to 8 AM emergent questions or overnight urgent questions should be addressed to Teleneurology On-call or [facility] neurohospitalist; contact information can be found on AMION

## 2020-05-08 NOTE — H&P (Signed)
History and Physical   Tracy Barton EAV:[id number] DOB: 02/05/1945 DOA: 05/08/2020  PCP: [facility]  Patient coming from: home  I have personally briefly reviewed patient's old medical records in [facility] EMR.  Chief Concern: Facial droop  HPI: Tracy Barton is a 75 y.o. female with medical history significant for history of trigeminal neuralgia, history of chronic hepatitis C, history of H. pylori ulcer, history of gastritis and gastroduodenitis, history of perforated prepyloric gastric ulcer status post omental Graham patch in 01/10/2020, protein-calorie malnutrition, history of laparoscopic splenectomy, current tobacco user, hypertension.  She presents emergency department for chief concerns of left facial drooping.  Per daughter, the left sided facial drooping started approximately 2:45 pm on day of presentation, lasting seconds, and patient endorsing right sided mouth numbness. This happened when she was eating spaghetti sauce.    Patient endorses persistent right-sided numbness that just started.  Patient states the numbness has been persistent and has not gone away.  At bedside, right-sided facial angle is minimally lower than the left and her daughter, Marita Snellen, states that her facial and lips are at baseline.  Social history: smokes about 3 cigarettes per day. She denies etoh and recreational drug use. She had many jobs during her adult life and is currently retired. Prior to retiring she was caring for disabled children.  Vaccinations: she is vaccinated for covid 19, two doses of Pfizer.  Patient had COVID-19 infection about 4 months ago.  ROS: Constitutional: no weight change, no fever ENT/Mouth: no sore throat, no rhinorrhea Eyes: no eye pain, no vision changes Cardiovascular: no chest pain, no dyspnea,  no edema, no palpitations Respiratory: no cough, no sputum, no wheezing Gastrointestinal: no nausea, no vomiting, no diarrhea, no  constipation Genitourinary: no urinary incontinence, no dysuria, no hematuria Musculoskeletal: no arthralgias, no myalgias Skin: no skin lesions, no pruritus, Neuro: + weakness, no loss of consciousness, no syncope Psych: no anxiety, no depression, + decrease appetite Heme/Lymph: no bruising, no bleeding  ED Course: Discussed with EDP, patient requiring hospitalization for stroke work-up.  Vitals in the emergency department was remarkable for temperature 98.6, heart rate of 57, respiration rate of 16, blood pressure 120/76, SPO2 of 98% on room air.  Labs in the emergency department was remarkable for serum sodium 1.08, BUN 28, nonfasting blood glucose 90, chloride 107, potassium 4.9, WBC 8.3, hemoglobin 13.2, platelets 372.  Assessment/Plan  Principal Problem:   CVA (cerebral vascular accident) [facility]) Active Problems:   Gastritis and gastroduodenitis   Trigeminal neuralgia   Hep C w/o coma, chronic (HCC)   Perforated prepyloric gastric ulcer s/p omental Cheree Ditto patch 01/10/2020   S/P laparoscopic splenectomy   Protein-calorie malnutrition, severe   Essential hypertension   Tobacco use   Left-sided facial drooping Strokelike symptoms - CT head without contrast read as: No acute intracranial abnormality or significant interval change.  Stable remote left occipital lobe infarct.  Stable atrophy and white matter disease. - Neurology has been consulted and we appreciate further recommendations - Complete echo ordered - MRI of the brain and MRA of the head ordered - Fasting lipid and A1c ordered - Permissive hypertension per neurology recommendations - Frequent neuro vascular checks - heart healthy - PT, OT, SLP - Fall precaution and aspiration precaution  History of hypertension-patient no longer taking amlodipine  History of perforated ulcer, gastritis, duodenitis-aspirin has not been initiated by myself, I will await neurology recommendation  Moderate to severe protein calorie  malnutrition-dietitian has been consulted  History of  tobacco use- counseling given for cessation  History of trigeminal neuralgia  Patient no longer taking any of her home medications  I recommend that patient have family stay past visitation hour and overnight to prevent delirium and sundowning  COVID PCR/influenza A/influenza B- test has been ordered, pending results  Chart reviewed.   06/01/2011: Patient has remote history of being followed by Dr. Herby Abraham at [facility], neurosurgery, for trigeminal neuralgia with facial tingling.  Patient was treated with Neurontin 3 times daily.  Gamma knife 3 was discussed with patient's S and alternative form of treatment many years ago as well.  DVT prophylaxis: Enoxaparin weight-based subcutaneous every 24 hours, SCDs Code Status: Full code Diet: Heart healthy Family Communication: Brownyn, daughter, updated at bedside Disposition Plan: Pending clinical course Consults called: Neurology Admission status: MedSurg, observation, with telemetry  Past Medical History:  Diagnosis Date  . Blood transfusion without reported diagnosis   . Depression   . Neuromuscular disorder [facility])    Past Surgical History:  Procedure Laterality Date  . BRAIN SURGERY    . LAPAROSCOPY N/A 01/10/2020   Procedure: LAPAROSCOPY DIAGNOSTIC laparoscopic washout omental patch splenectomy;  Surgeon: Karie Soda, MD;  Location: [facility] ORS;  Service: General;  Laterality: N/A;  . LAPAROTOMY N/A 01/10/2020   Procedure: EXPLORATORY LAPAROTOMY;  Surgeon: Karie Soda, MD;  Location: [facility] ORS;  Service: General;  Laterality: N/A;  . TUBAL LIGATION     Social History:  reports that she has been smoking. She has a 26.50 pack-year smoking history. She has never used smokeless tobacco. She reports current alcohol use of about 2.0 standard drinks of alcohol per week. She reports that she does not use drugs.  Allergies  Allergen Reactions  . Nsaids Other  (See Comments)    PERFORATED ULCER   Family History  Problem Relation Age of Onset  . Stroke Mother   . Cancer Mother   . Cancer Brother   . Mental illness Maternal Grandmother    Family history: Family history reviewed and not pertinent  Prior to Admission medications   Medication Sig Start Date End Date Taking? Authorizing Provider  acetaminophen (TYLENOL) 325 MG tablet Take 650 mg by mouth every 6 (six) hours as needed.   Yes [provider]  diphenhydrAMINE (BENADRYL) 25 MG tablet Take 25 mg by mouth every 8 (eight) hours as needed.   Yes [provider]  thiamine 100 MG tablet Take 1 tablet (100 mg total) by mouth daily. 01/24/20  Yes Rama, Maryruth Bun, MD  amLODipine (NORVASC) 5 MG tablet Take 1 tablet (5 mg total) by mouth daily. Patient not taking: Reported on 05/08/2020 01/24/20   Rama, Maryruth Bun, MD  amoxicillin (AMOXIL) 500 MG capsule Take 2 capsules (1,000 mg total) by mouth every 12 (twelve) hours. Patient not taking: No sig reported 01/23/20   Rama, Maryruth Bun, MD  Calcium-Vit D-Arg-Inos-Silicon (BONE DENSITY) 300-200 MG-UNIT TABS Take 1 tablet by mouth 2 (two) times daily. Patient not taking: No sig reported 12/02/12   Carmelina Dane, MD  clarithromycin (BIAXIN) 500 MG tablet Take 1 tablet (500 mg total) by mouth every 12 (twelve) hours. Patient not taking: No sig reported 01/23/20   Rama, Maryruth Bun, MD  folic acid (FOLVITE) 1 MG tablet Take 1 tablet (1 mg total) by mouth daily. Patient not taking: Reported on 05/08/2020 01/24/20   Rama, Maryruth Bun, MD  lansoprazole (PREVACID) 30 MG capsule Take 1 capsule (30 mg total) by mouth daily at  12 noon. Patient not taking: Reported on 05/08/2020 01/23/20   Rama, Maryruth Bun, MD  lidocaine (LIDODERM) 5 % Place 1 patch onto the skin daily. Remove & Discard patch within 12 hours or as directed by MD Patient not taking: No sig reported 01/24/20   Rama, Maryruth Bun, MD  pantoprazole (PROTONIX) 40 MG tablet Take 1  tablet (40 mg total) by mouth 2 (two) times daily. Patient not taking: Reported on 05/08/2020 01/23/20   Rama, Maryruth Bun, MD  QUEtiapine (SEROQUEL) 25 MG tablet Take 25 mg by mouth at bedtime. Patient not taking: Reported on 05/08/2020 02/17/20   [provider]  sulfamethoxazole-trimethoprim (BACTRIM DS) 800-160 MG tablet Take 1 tablet by mouth 2 (two) times daily. Patient not taking: Reported on 05/08/2020 02/24/20   [provider]   Physical Exam: Vitals:   05/08/20 1620 05/08/20 1622 05/08/20 1730 05/08/20 1800  BP: 132/80  120/76 107/61  Pulse: 70  (!) 57 (!) 56  Resp: 16  19 16   Temp: 98.6 F (37 C)     TempSrc: Oral     SpO2: 98%  98% 97%  Weight:  40 kg    Height:  5\' 3"  (1.6 m)     Constitutional: appears age-appropriate, frail, cachectic, NAD, calm, comfortable Eyes: PERRL, lids and conjunctivae normal ENMT: Mucous membranes are moist. Posterior pharynx clear of any exudate or lesions. Age-appropriate dentition. Hearing appropriate Neck: normal, supple, no masses, no thyromegaly Respiratory: clear to auscultation bilaterally, no wheezing, no crackles. Normal respiratory effort. No accessory muscle use.  Cardiovascular: Regular rate and rhythm, no murmurs / rubs / gallops. No extremity edema. 2+ pedal pulses. No carotid bruits.  Abdomen: no tenderness, no masses palpated, no hepatosplenomegaly. Bowel sounds positive.  Musculoskeletal: no clubbing / cyanosis. No joint deformity upper and lower extremities. Good ROM, no contractures, no atrophy. Normal muscle tone.  Skin: no rashes, lesions, ulcers. No induration Neurologic: Sensation intact. Strength 5/5 in all 4.  Psychiatric: Normal judgment and insight. Alert and oriented x 3. Normal mood.   EKG: independently reviewed, showing sinus rhythm with rate of 81, QTc 393  Chest x-ray on Admission: I personally reviewed and I agree with radiologist reading as below.  MR ANGIO HEAD WO CONTRAST  Result Date:  05/08/2020 CLINICAL DATA:  Neuro deficit, acute, stroke suspected. Right-sided facial droop and numbness. EXAM: MRI HEAD WITHOUT CONTRAST MRA HEAD WITHOUT CONTRAST TECHNIQUE: Multiplanar, multiecho pulse sequences of the brain and surrounding structures were obtained without intravenous contrast. Angiographic images of the head were obtained using MRA technique without contrast. COMPARISON:  CT head without contrast 05/08/2020. MR head without contrast 01/20/2020. FINDINGS: MRI HEAD FINDINGS Brain: The diffusion-weighted images demonstrate no acute or subacute infarction. Moderate atrophy and diffuse white matter disease is similar the prior exam. No acute infarct, hemorrhage, or mass lesion is present. The ventricles are of normal size. No significant extraaxial fluid collection is present. The internal auditory canals are within normal limits. Remote left occipital lobe infarct again noted. Mild white matter changes extend into the brainstem. Brainstem and cerebellum are otherwise within normal limits. Vascular: Flow is present in the major intracranial arteries. Skull and upper cervical spine: The craniocervical junction is normal. Upper cervical spine is within normal limits. Marrow signal is unremarkable. Sinuses/Orbits: The paranasal sinuses and mastoid air cells are clear. The globes and orbits are within normal limits. MRA HEAD FINDINGS Moderate irregularity is present through the cavernous internal carotid arteries bilaterally without a significant stenosis of greater than  50% relative to the more distal vessels. ICA termini are intact. A1 and M1 segments are normal. MCA bifurcations are intact. ACA and MCA branch vessels are unremarkable. The left vertebral artery is the dominant vessel. Left PICA origin not discretely visualized. Right PICA origin visualized and normal. Vertebrobasilar junction is normal. Basilar artery is normal. The left posterior cerebral artery is of fetal type. The right posterior  cerebral artery originates from basilar tip. P2 segment narrowing is present bilaterally. Asymmetric attenuation of left PCA branch vessels noted. IMPRESSION: 1. No acute intracranial abnormality or significant interval change. 2. Stable moderate atrophy and diffuse white matter disease. 3. Remote left occipital lobe infarct. 4. Moderate medium and small vessel disease bilaterally. 5. Moderate P2 segment stenoses bilaterally with asymmetric attenuation of left PCA branch vessels which correlates with the remote infarct. 6. Medium and distal small vessel disease without significant proximal stenosis or occlusion in the anterior circulation. Electronically Signed   By: Marin Roberts M.D.   On: 05/08/2020 17:21   MR BRAIN WO CONTRAST  Result Date: 05/08/2020 CLINICAL DATA:  Neuro deficit, acute, stroke suspected. Right-sided facial droop and numbness. EXAM: MRI HEAD WITHOUT CONTRAST MRA HEAD WITHOUT CONTRAST TECHNIQUE: Multiplanar, multiecho pulse sequences of the brain and surrounding structures were obtained without intravenous contrast. Angiographic images of the head were obtained using MRA technique without contrast. COMPARISON:  CT head without contrast 05/08/2020. MR head without contrast 01/20/2020. FINDINGS: MRI HEAD FINDINGS Brain: The diffusion-weighted images demonstrate no acute or subacute infarction. Moderate atrophy and diffuse white matter disease is similar the prior exam. No acute infarct, hemorrhage, or mass lesion is present. The ventricles are of normal size. No significant extraaxial fluid collection is present. The internal auditory canals are within normal limits. Remote left occipital lobe infarct again noted. Mild white matter changes extend into the brainstem. Brainstem and cerebellum are otherwise within normal limits. Vascular: Flow is present in the major intracranial arteries. Skull and upper cervical spine: The craniocervical junction is normal. Upper cervical spine is within  normal limits. Marrow signal is unremarkable. Sinuses/Orbits: The paranasal sinuses and mastoid air cells are clear. The globes and orbits are within normal limits. MRA HEAD FINDINGS Moderate irregularity is present through the cavernous internal carotid arteries bilaterally without a significant stenosis of greater than 50% relative to the more distal vessels. ICA termini are intact. A1 and M1 segments are normal. MCA bifurcations are intact. ACA and MCA branch vessels are unremarkable. The left vertebral artery is the dominant vessel. Left PICA origin not discretely visualized. Right PICA origin visualized and normal. Vertebrobasilar junction is normal. Basilar artery is normal. The left posterior cerebral artery is of fetal type. The right posterior cerebral artery originates from basilar tip. P2 segment narrowing is present bilaterally. Asymmetric attenuation of left PCA branch vessels noted. IMPRESSION: 1. No acute intracranial abnormality or significant interval change. 2. Stable moderate atrophy and diffuse white matter disease. 3. Remote left occipital lobe infarct. 4. Moderate medium and small vessel disease bilaterally. 5. Moderate P2 segment stenoses bilaterally with asymmetric attenuation of left PCA branch vessels which correlates with the remote infarct. 6. Medium and distal small vessel disease without significant proximal stenosis or occlusion in the anterior circulation. Electronically Signed   By: Marin Roberts M.D.   On: 05/08/2020 17:21   CT HEAD CODE STROKE WO CONTRAST  Result Date: 05/08/2020 CLINICAL DATA:  Code stroke. New onset facial numbness beginning at 3:30 today. EXAM: CT HEAD WITHOUT CONTRAST TECHNIQUE: Contiguous axial images  were obtained from the base of the skull through the vertex without intravenous contrast. COMPARISON:  CT head without contrast 04/12/2020 FINDINGS: Brain: Remote left occipital lobe infarct stable. Mild atrophy and white matter disease is unchanged.  Basal ganglia are intact. Insular ribbon is normal. No acute or focal cortical abnormality is present otherwise. The ventricles are of normal size. No significant extraaxial fluid collection is present. Insert normal brainstem Vascular: Dense atherosclerotic calcifications are again seen cavernous internal carotid arteries bilaterally. No hyperdense vessel is present. Skull: Calvarium is intact. No focal lytic or blastic lesions are present. No significant extracranial soft tissue lesion is present. Sinuses/Orbits: The paranasal sinuses and mastoid air cells are clear. The globes and orbits are within normal limits. ASPECTS [facility]([place] Stroke Program Early CT Score) - Ganglionic level infarction (caudate, lentiform nuclei, internal capsule, insula, M1-M3 cortex): 7/7 - Supraganglionic infarction (M4-M6 cortex): 3/3 Total score (0-10 with 10 being normal): 10/10 IMPRESSION: 1. No acute intracranial abnormality or significant interval change. 2. Stable remote left occipital lobe infarct. 3. Stable atrophy and white matter disease. These results were called by telephone at the time of interpretation on 05/08/2020 at 4:17 pm to provider [facility] , who verbally acknowledged these results. Electronically Signed   By: Marin Robertshristopher  Mattern M.D.   On: 05/08/2020 16:17   Labs on Admission: I have personally reviewed following labs  CBC: Recent Labs  Lab 05/08/20 1623  WBC 8.3  NEUTROABS 3.9  HGB 13.2  HCT 40.5  MCV 82.0  PLT 372   Basic Metabolic Panel: Recent Labs  Lab 05/08/20 1623  NA 140  K 4.9  CL 107  CO2 25  GLUCOSE 90  BUN 28*  CREATININE 1.08*  CALCIUM 9.9   GFR: Estimated Creatinine Clearance: 28.4 mL/min (A) (by C-G formula based on SCr of 1.08 mg/dL (H)).  Liver Function Tests: Recent Labs  Lab 05/08/20 1623  AST 20  ALT 14  ALKPHOS 72  BILITOT 0.4  PROT 7.0  ALBUMIN 3.9   Coagulation Profile: Recent Labs  Lab 05/08/20 1623  INR 0.9   CBG: Recent Labs  Lab  05/08/20 1600  GLUCAP 174*   Urine analysis:    Component Value Date/Time   COLORURINE YELLOW (A) 05/08/2020 1624   APPEARANCEUR HAZY (A) 05/08/2020 1624   LABSPEC 1.018 05/08/2020 1624   PHURINE 5.0 05/08/2020 1624   GLUCOSEU NEGATIVE 05/08/2020 1624   HGBUR NEGATIVE 05/08/2020 1624   BILIRUBINUR NEGATIVE 05/08/2020 1624   BILIRUBINUR neg 03/24/2013 1409   KETONESUR NEGATIVE 05/08/2020 1624   PROTEINUR NEGATIVE 05/08/2020 1624   UROBILINOGEN 0.2 03/24/2013 1409   UROBILINOGEN 0.2 09/24/2008 1045   NITRITE NEGATIVE 05/08/2020 1624   LEUKOCYTESUR NEGATIVE 05/08/2020 1624   Yukari Flax N Yaman Grauberger D.O. [facility]  If 7PM-7AM, please contact overnight-coverage provider If 7AM-7PM, please contact day coverage provider [email or web address]  05/08/2020, 7:12 PM

## 2020-05-08 NOTE — ED Provider Notes (Signed)
[facility] Emergency Department Provider Note  ____________________________________________   Event Date/Time   First MD Initiated Contact with Patient 05/08/20 1606     (approximate)  I have reviewed the triage vital signs and the nursing notes.   HISTORY  Chief Complaint Code Stroke    HPI Tracy Barton is a 75 y.o. female  Here with right facial numbness/asymmetry. Pt daughter reports pt was in her usual state of health this AM. Pt's daughter was making food when she came into the room to see if pt wanted to taste it. This was around 3-3:30 PM. At that time, pt was noted to have a right face droop and was complaining that her face felt numb and tingly.  She was well prior to this.  Since then, daughter feels like the face has improved.  Patient otherwise denies any complaints.  She does not believe she has had any recent changes in her medications.  Patient also was slightly disoriented on arrival, though this is not necessarily new as patient has some mild dementia according to the daughter.  No known history of stroke.  No other complaints.  Patient now states her numbness is still there, but is improving.  She does not feel like her face is asymmetric anymore.  Denies any focal numbness or weakness in arms or legs.       Past Medical History:  Diagnosis Date  . Blood transfusion without reported diagnosis   . Depression   . Neuromuscular disorder [facility])     Patient Active Problem List   Diagnosis Date Noted  . CVA (cerebral vascular accident) (HCC) 05/08/2020  . Tobacco use 05/08/2020  . Syncope 02/01/2020  . COVID-19 02/01/2020  . Sinus bradycardia on ECG 02/01/2020  . H pylori ulcer 01/23/2020  . Acute metabolic encephalopathy 01/23/2020  . Chronic alcohol abuse 01/23/2020  . Hypokalemia 01/23/2020  . Hypophosphatemia 01/23/2020  . Acute blood loss anemia 01/23/2020  . Essential hypertension 01/23/2020  . Protein-calorie malnutrition,  severe 01/15/2020  . Perforated prepyloric gastric ulcer s/p omental Cheree Ditto patch 01/10/2020 01/10/2020  . History of medication noncompliance 01/10/2020  . ARF (acute renal failure) (HCC) 01/10/2020  . 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 Hep C w/o coma, chronic (HCC) 09/12/2014  . Head injury 09/11/2014  . Back injury 04/15/2014  . Smoker 03/24/2013  . Gastritis and gastroduodenitis 03/24/2013  . Loss of weight 03/24/2013  . Unspecified vitamin D deficiency 03/24/2013  . Trigeminal neuralgia 06/01/2011    Past Surgical History:  Procedure Laterality Date  . BRAIN SURGERY    . LAPAROSCOPY N/A 01/10/2020   Procedure: LAPAROSCOPY DIAGNOSTIC laparoscopic washout omental patch splenectomy;  Surgeon: Karie Soda, MD;  Location: [facility] ORS;  Service: General;  Laterality: N/A;  . LAPAROTOMY N/A 01/10/2020   Procedure: EXPLORATORY LAPAROTOMY;  Surgeon: Karie Soda, MD;  Location: [facility] ORS;  Service: General;  Laterality: N/A;  . TUBAL LIGATION      Prior to Admission medications   Medication Sig Start Date End Date Taking? Authorizing Provider  acetaminophen (TYLENOL) 325 MG tablet Take 650 mg by mouth every 6 (six) hours as needed.   Yes [provider]  diphenhydrAMINE (BENADRYL) 25 MG tablet Take 25 mg by mouth every 8 (eight) hours as needed.   Yes [provider]  thiamine 100 MG tablet Take 1 tablet (100 mg total) by mouth daily. 01/24/20  Yes Rama, Maryruth Bun, MD  amLODipine (NORVASC) 5 MG tablet Take 1 tablet (5 mg  total) by mouth daily. Patient not taking: Reported on 05/08/2020 01/24/20   Rama, Maryruth Bun, MD  amoxicillin (AMOXIL) 500 MG capsule Take 2 capsules (1,000 mg total) by mouth every 12 (twelve) hours. Patient not taking: No sig reported 01/23/20   Rama, Maryruth Bun, MD  Calcium-Vit D-Arg-Inos-Silicon (BONE DENSITY) 300-200 MG-UNIT TABS Take 1 tablet by mouth 2 (two) times daily. Patient not taking: No sig reported 12/02/12   Carmelina Dane, MD   clarithromycin (BIAXIN) 500 MG tablet Take 1 tablet (500 mg total) by mouth every 12 (twelve) hours. Patient not taking: No sig reported 01/23/20   Rama, Maryruth Bun, MD  folic acid (FOLVITE) 1 MG tablet Take 1 tablet (1 mg total) by mouth daily. Patient not taking: Reported on 05/08/2020 01/24/20   Rama, Maryruth Bun, MD  lansoprazole (PREVACID) 30 MG capsule Take 1 capsule (30 mg total) by mouth daily at 12 noon. Patient not taking: Reported on 05/08/2020 01/23/20   Rama, Maryruth Bun, MD  lidocaine (LIDODERM) 5 % Place 1 patch onto the skin daily. Remove & Discard patch within 12 hours or as directed by MD Patient not taking: No sig reported 01/24/20   Rama, Maryruth Bun, MD  pantoprazole (PROTONIX) 40 MG tablet Take 1 tablet (40 mg total) by mouth 2 (two) times daily. Patient not taking: Reported on 05/08/2020 01/23/20   Rama, Maryruth Bun, MD  QUEtiapine (SEROQUEL) 25 MG tablet Take 25 mg by mouth at bedtime. Patient not taking: Reported on 05/08/2020 02/17/20   [provider]  sulfamethoxazole-trimethoprim (BACTRIM DS) 800-160 MG tablet Take 1 tablet by mouth 2 (two) times daily. Patient not taking: Reported on 05/08/2020 02/24/20   [provider]    Allergies Nsaids  Family History  Problem Relation Age of Onset  . Stroke Mother   . Cancer Mother   . Cancer Brother   . Mental illness Maternal Grandmother     Social History Social History   Tobacco Use  . Smoking status: Current Some Day Smoker    Packs/day: 0.50    Years: 53.00    Pack years: 26.50  . Smokeless tobacco: Never Used  Vaping Use  . Vaping Use: Never used  Substance Use Topics  . Alcohol use: Yes    Alcohol/week: 2.0 standard drinks    Types: 2 Cans of beer per week  . Drug use: No    Review of Systems  Review of Systems  Constitutional: Negative for chills and fever.  HENT: Positive for facial swelling. Negative for sore throat.   Respiratory: Negative for shortness of breath.   Cardiovascular:  Negative for chest pain.  Gastrointestinal: Negative for abdominal pain.  Genitourinary: Negative for flank pain.  Musculoskeletal: Negative for neck pain.  Skin: Negative for rash and wound.  Allergic/Immunologic: Negative for immunocompromised state.  Neurological: Positive for numbness. Negative for weakness.  Hematological: Does not bruise/bleed easily.     ____________________________________________  PHYSICAL EXAM:      VITAL SIGNS: ED Triage Vitals  Enc Vitals Group     BP 05/08/20 1559 136/63     Pulse Rate 05/08/20 1559 68     Resp 05/08/20 1559 18     Temp 05/08/20 1559 98.1 F (36.7 C)     Temp Source 05/08/20 1559 Oral     SpO2 05/08/20 1559 97 %     Weight 05/08/20 1622 88 lb 2.9 oz (40 kg)     Height 05/08/20 1622  (1.6 m)  Head Circumference --      Peak Flow --      Pain Score 05/08/20 1622 0     Pain Loc --      Pain Edu? --      Excl. in GC? --      Physical Exam Vitals and nursing note reviewed.  Constitutional:      General: She is not in acute distress.    Appearance: She is well-developed.  HENT:     Head: Normocephalic and atraumatic.  Eyes:     Conjunctiva/sclera: Conjunctivae normal.  Cardiovascular:     Rate and Rhythm: Normal rate and regular rhythm.     Heart sounds: Normal heart sounds. No murmur heard. No friction rub.  Pulmonary:     Effort: Pulmonary effort is normal. No respiratory distress.     Breath sounds: Normal breath sounds. No wheezing or rales.  Abdominal:     General: There is no distension.     Palpations: Abdomen is soft.     Tenderness: There is no abdominal tenderness.  Musculoskeletal:     Cervical back: Neck supple.  Skin:    General: Skin is warm.     Capillary Refill: Capillary refill takes less than 2 seconds.  Neurological:     Mental Status: She is alert and oriented to person, place, and time.     Motor: No abnormal muscle tone.      Neurological Exam:  Mental Status: Alert and oriented  to person, place, and time. Attention and concentration normal. Speech clear. Recent memory is intact. Cranial Nerves: Visual fields grossly intact. EOMI and PERRLA. No nystagmus noted. Facial sensation intact at forehead, maxillary cheek, and chin/mandible bilaterally. No facial asymmetry or weakness. Hearing grossly normal. Uvula is midline, and palate elevates symmetrically. Normal SCM and trapezius strength. Tongue midline without fasciculations. Motor: Muscle strength 5/5 in proximal and distal UE and LE bilaterally. No pronator drift. Muscle tone normal.  Sensation: Intact to light touch in upper and lower extremities distally bilaterally.  Coordination: Normal FTN bilaterally.    ____________________________________________   LABS (all labs ordered are listed, but only abnormal results are displayed)  Labs Reviewed  COMPREHENSIVE METABOLIC PANEL - Abnormal; Notable for the following components:      Result Value   BUN 28 (*)    Creatinine, Ser 1.08 (*)    GFR, Estimated 54 (*)    All other components within normal limits  URINALYSIS, COMPLETE (UACMP) WITH MICROSCOPIC - Abnormal; Notable for the following components:   Color, Urine YELLOW (*)    APPearance HAZY (*)    All other components within normal limits  CBG MONITORING, ED - Abnormal; Notable for the following components:   Glucose-Capillary 174 (*)    All other components within normal limits  RESP PANEL BY RT-PCR (FLU A&B, COVID) ARPGX2  PROTIME-INR  APTT  CBC  DIFFERENTIAL  HEMOGLOBIN A1C  LIPID PANEL  VITAMIN B12  TSH  RPR  HIV ANTIBODY (ROUTINE TESTING W REFLEX)  I-STAT CREATININE, ED    ____________________________________________  EKG: Normal sinus rhythm, VR 81. PR 136, QRS 142, QTc 393. No acute ST elevations. pACs noted. ________________________________________  RADIOLOGY All imaging, including plain films, CT scans, and ultrasounds, independently reviewed by me, and interpretations confirmed via  formal radiology reads.  ED MD interpretation:   CT Head: NAICA MR Brain: No acute stroke MR Angio: chronic multivessel disease  Official radiology report(s): MR ANGIO HEAD WO CONTRAST  Result Date: 05/08/2020 CLINICAL DATA:  Neuro deficit, acute, stroke suspected. Right-sided facial droop and numbness. EXAM: MRI HEAD WITHOUT CONTRAST MRA HEAD WITHOUT CONTRAST TECHNIQUE: Multiplanar, multiecho pulse sequences of the brain and surrounding structures were obtained without intravenous contrast. Angiographic images of the head were obtained using MRA technique without contrast. COMPARISON:  CT head without contrast 05/08/2020. MR head without contrast 01/20/2020. FINDINGS: MRI HEAD FINDINGS Brain: The diffusion-weighted images demonstrate no acute or subacute infarction. Moderate atrophy and diffuse white matter disease is similar the prior exam. No acute infarct, hemorrhage, or mass lesion is present. The ventricles are of normal size. No significant extraaxial fluid collection is present. The internal auditory canals are within normal limits. Remote left occipital lobe infarct again noted. Mild white matter changes extend into the brainstem. Brainstem and cerebellum are otherwise within normal limits. Vascular: Flow is present in the major intracranial arteries. Skull and upper cervical spine: The craniocervical junction is normal. Upper cervical spine is within normal limits. Marrow signal is unremarkable. Sinuses/Orbits: The paranasal sinuses and mastoid air cells are clear. The globes and orbits are within normal limits. MRA HEAD FINDINGS Moderate irregularity is present through the cavernous internal carotid arteries bilaterally without a significant stenosis of greater than 50% relative to the more distal vessels. ICA termini are intact. A1 and M1 segments are normal. MCA bifurcations are intact. ACA and MCA branch vessels are unremarkable. The left vertebral artery is the dominant vessel. Left PICA  origin not discretely visualized. Right PICA origin visualized and normal. Vertebrobasilar junction is normal. Basilar artery is normal. The left posterior cerebral artery is of fetal type. The right posterior cerebral artery originates from basilar tip. P2 segment narrowing is present bilaterally. Asymmetric attenuation of left PCA branch vessels noted. IMPRESSION: 1. No acute intracranial abnormality or significant interval change. 2. Stable moderate atrophy and diffuse white matter disease. 3. Remote left occipital lobe infarct. 4. Moderate medium and small vessel disease bilaterally. 5. Moderate P2 segment stenoses bilaterally with asymmetric attenuation of left PCA branch vessels which correlates with the remote infarct. 6. Medium and distal small vessel disease without significant proximal stenosis or occlusion in the anterior circulation. Electronically Signed   By: Marin Roberts M.D.   On: 05/08/2020 17:21   MR BRAIN WO CONTRAST  Result Date: 05/08/2020 CLINICAL DATA:  Neuro deficit, acute, stroke suspected. Right-sided facial droop and numbness. EXAM: MRI HEAD WITHOUT CONTRAST MRA HEAD WITHOUT CONTRAST TECHNIQUE: Multiplanar, multiecho pulse sequences of the brain and surrounding structures were obtained without intravenous contrast. Angiographic images of the head were obtained using MRA technique without contrast. COMPARISON:  CT head without contrast 05/08/2020. MR head without contrast 01/20/2020. FINDINGS: MRI HEAD FINDINGS Brain: The diffusion-weighted images demonstrate no acute or subacute infarction. Moderate atrophy and diffuse white matter disease is similar the prior exam. No acute infarct, hemorrhage, or mass lesion is present. The ventricles are of normal size. No significant extraaxial fluid collection is present. The internal auditory canals are within normal limits. Remote left occipital lobe infarct again noted. Mild white matter changes extend into the brainstem. Brainstem and  cerebellum are otherwise within normal limits. Vascular: Flow is present in the major intracranial arteries. Skull and upper cervical spine: The craniocervical junction is normal. Upper cervical spine is within normal limits. Marrow signal is unremarkable. Sinuses/Orbits: The paranasal sinuses and mastoid air cells are clear. The globes and orbits are within normal limits. MRA HEAD FINDINGS Moderate irregularity is present through the cavernous internal carotid arteries bilaterally without a significant stenosis of greater  than 50% relative to the more distal vessels. ICA termini are intact. A1 and M1 segments are normal. MCA bifurcations are intact. ACA and MCA branch vessels are unremarkable. The left vertebral artery is the dominant vessel. Left PICA origin not discretely visualized. Right PICA origin visualized and normal. Vertebrobasilar junction is normal. Basilar artery is normal. The left posterior cerebral artery is of fetal type. The right posterior cerebral artery originates from basilar tip. P2 segment narrowing is present bilaterally. Asymmetric attenuation of left PCA branch vessels noted. IMPRESSION: 1. No acute intracranial abnormality or significant interval change. 2. Stable moderate atrophy and diffuse white matter disease. 3. Remote left occipital lobe infarct. 4. Moderate medium and small vessel disease bilaterally. 5. Moderate P2 segment stenoses bilaterally with asymmetric attenuation of left PCA branch vessels which correlates with the remote infarct. 6. Medium and distal small vessel disease without significant proximal stenosis or occlusion in the anterior circulation. Electronically Signed   By: Marin Roberts M.D.   On: 05/08/2020 17:21   DG Chest Port 1 View  Result Date: 05/08/2020 CLINICAL DATA:  Weakness and cough EXAM: PORTABLE CHEST 1 VIEW COMPARISON:  02/01/2020 FINDINGS: The heart size and mediastinal contours are within normal limits. Both lungs are clear. The visualized  skeletal structures are unremarkable. IMPRESSION: No active disease. Electronically Signed   By: Deatra Robinson M.D.   On: 05/08/2020 19:14   CT HEAD CODE STROKE WO CONTRAST  Result Date: 05/08/2020 CLINICAL DATA:  Code stroke. New onset facial numbness beginning at 3:30 today. EXAM: CT HEAD WITHOUT CONTRAST TECHNIQUE: Contiguous axial images were obtained from the base of the skull through the vertex without intravenous contrast. COMPARISON:  CT head without contrast 04/12/2020 FINDINGS: Brain: Remote left occipital lobe infarct stable. Mild atrophy and white matter disease is unchanged. Basal ganglia are intact. Insular ribbon is normal. No acute or focal cortical abnormality is present otherwise. The ventricles are of normal size. No significant extraaxial fluid collection is present. Insert normal brainstem Vascular: Dense atherosclerotic calcifications are again seen cavernous internal carotid arteries bilaterally. No hyperdense vessel is present. Skull: Calvarium is intact. No focal lytic or blastic lesions are present. No significant extracranial soft tissue lesion is present. Sinuses/Orbits: The paranasal sinuses and mastoid air cells are clear. The globes and orbits are within normal limits. ASPECTS [facility] Stroke Program Early CT Score) - Ganglionic level infarction (caudate, lentiform nuclei, internal capsule, insula, M1-M3 cortex): 7/7 - Supraganglionic infarction (M4-M6 cortex): 3/3 Total score (0-10 with 10 being normal): 10/10 IMPRESSION: 1. No acute intracranial abnormality or significant interval change. 2. Stable remote left occipital lobe infarct. 3. Stable atrophy and white matter disease. These results were called by telephone at the time of interpretation on 05/08/2020 at 4:17 pm to provider [facility] , who verbally acknowledged these results. Electronically Signed   By: Marin Roberts M.D.   On: 05/08/2020 16:17     ____________________________________________  PROCEDURES   Procedure(s) performed (including Critical Care):  Procedures  ____________________________________________  INITIAL IMPRESSION / MDM / ASSESSMENT AND PLAN / ED COURSE  As part of my medical decision making, I reviewed the following data within the electronic MEDICAL RECORD NUMBER Nursing notes reviewed and incorporated, Old chart reviewed, Notes from prior ED visits, and [place] Controlled Substance Database       *FORESTINE MACHO was evaluated in Emergency Department on 05/09/2020 for the symptoms described in the history of present illness. She was evaluated in the context of the global COVID-19 pandemic, which  necessitated consideration that the patient might be at risk for infection with the SARS-CoV-2 virus that causes COVID-19. Institutional protocols and algorithms that pertain to the evaluation of patients at risk for COVID-19 are in a state of rapid change based on information released by regulatory bodies including the CDC and federal and state organizations. These policies and algorithms were followed during the patient's care in the ED.  Some ED evaluations and interventions may be delayed as a result of limited staffing during the pandemic.*     Medical Decision Making:  75 yo F here with transient right facial weakness and numbness, now resolved. Pt initially seen as a code stroke and Neuro has evaluated. They suspect possible TIA, but recommend MR for further evaluation. Pt likely candidate for anticoagulation. Of note, in ED, tele shows intermittent afib. This is new. Likely will need anticoagulation. Labs o/w reassuring, and at baseline. Will admit for observation, TIE/CVA work-up. Family updated and in agreement.  ____________________________________________  FINAL CLINICAL IMPRESSION(S) / ED DIAGNOSES  Final diagnoses:  TIA (transient ischemic attack)  New onset atrial fibrillation (HCC)     MEDICATIONS GIVEN  DURING THIS VISIT:  Medications   stroke: mapping our early stages of recovery book (0 each Does not apply Hold 05/08/20 1835)  acetaminophen (TYLENOL) tablet 650 mg (has no administration in time range)    Or  acetaminophen (TYLENOL) 160 MG/5ML solution 650 mg (has no administration in time range)    Or  acetaminophen (TYLENOL) suppository 650 mg (has no administration in time range)  senna-docusate (Senokot-S) tablet 1 tablet (has no administration in time range)  enoxaparin (LOVENOX) injection 30 mg (30 mg Subcutaneous Given 05/08/20 2119)  thiamine tablet 100 mg (100 mg Oral Given 05/08/20 2119)  labetalol (NORMODYNE) injection 5 mg (has no administration in time range)     ED Discharge Orders    None       Note:  This document was prepared using Dragon voice recognition software and may include unintentional dictation errors.   Shaune Pollack, MD 05/09/20 [phone]

## 2020-05-08 NOTE — ED Notes (Signed)
Patient was taking right ear earring out in CT and dropped the back to earring. Staff not able to locate earring back. Left earring and right earring stub placed in bag at bedside

## 2020-05-09 DIAGNOSIS — I639 Cerebral infarction, unspecified: Secondary | ICD-10-CM

## 2020-05-09 DIAGNOSIS — G459 Transient cerebral ischemic attack, unspecified: Secondary | ICD-10-CM | POA: Diagnosis not present

## 2020-05-09 DIAGNOSIS — I480 Paroxysmal atrial fibrillation: Secondary | ICD-10-CM

## 2020-05-09 LAB — VITAMIN B12: Vitamin B-12: 241 pg/mL (ref 180–914)

## 2020-05-09 LAB — TSH: TSH: 1.183 u[IU]/mL (ref 0.350–4.500)

## 2020-05-09 LAB — LIPID PANEL
Cholesterol: 234 mg/dL — ABNORMAL HIGH (ref 0–200)
HDL: 54 mg/dL (ref >40)
LDL Cholesterol: 138 mg/dL — ABNORMAL HIGH (ref 0–99)
Total CHOL/HDL Ratio: 4.3 RATIO
Triglycerides: 208 mg/dL — ABNORMAL HIGH (ref <150)
VLDL: 42 mg/dL — ABNORMAL HIGH (ref 0–40)

## 2020-05-09 LAB — HIV ANTIBODY (ROUTINE TESTING W REFLEX): HIV Screen 4th Generation wRfx: NONREACTIVE

## 2020-05-09 LAB — HEMOGLOBIN A1C
Hgb A1c MFr Bld: 5.7 % — ABNORMAL HIGH (ref 4.8–5.6)
Mean Plasma Glucose: 116.89 mg/dL

## 2020-05-09 LAB — RPR: RPR Ser Ql: NONREACTIVE

## 2020-05-09 MED ORDER — ATORVASTATIN CALCIUM 40 MG PO TABS: 40.0000 mg | ORAL_TABLET | Freq: Every day | ORAL | 0 refills | Status: AC

## 2020-05-09 MED ORDER — ATORVASTATIN CALCIUM 20 MG PO TABS: 40.0000 mg | ORAL_TABLET | Freq: Every day | ORAL | 0 refills | Status: DC

## 2020-05-09 MED ORDER — ATORVASTATIN CALCIUM 20 MG PO TABS: 40.0000 mg | ORAL_TABLET | Freq: Every day | ORAL | Status: DC

## 2020-05-09 MED ORDER — APIXABAN 5 MG PO TABS: 5.0000 mg | ORAL_TABLET | Freq: Two times a day (BID) | ORAL | 0 refills | Status: DC

## 2020-05-09 MED ORDER — APIXABAN 5 MG PO TABS
5.0000 mg | ORAL_TABLET | Freq: Two times a day (BID) | ORAL | Status: DC
  Administered 2020-05-09: 10:00:00 5 mg via ORAL
  Filled 2020-05-09: qty 1

## 2020-05-09 NOTE — Discharge Summary (Addendum)
Physician Discharge Summary  Tracy Barton FYB:[id number] DOB: [birth year] DOA: 05/08/2020  PCP: [facility]  Admit date: 05/08/2020 Discharge date: 05/09/2020  Admitted From: Home Disposition: Home  Recommendations for Outpatient Follow-up:  1. Follow up with PCP in 1 week with repeat CBC/BMP 2. Outpatient follow-up with neurology 3. Follow up in ED if symptoms worsen or new appear   Home Health: No Equipment/Devices: None  Discharge Condition: Stable CODE STATUS: Full Diet recommendation: Heart healthy/diet as per SLP recommendations recommendations  Brief/Interim Summary: 75 y.o. female with medical history significant for history of trigeminal neuralgia, history of chronic hepatitis C, history of H. pylori ulcer, history of gastritis and gastroduodenitis, history of perforated prepyloric gastric ulcer status post omental Graham patch in 01/10/2020, protein-calorie malnutrition, history of laparoscopic splenectomy, current tobacco user, hypertension and memory impairment presented with left facial droop and right-sided numbness.  On presentation, CT of the head was negative for acute intracranial abnormality.  Neurology was consulted.  MRI of the brain showed no acute intracranial abnormality.  Carotid ultrasound showed 1 to 49% stenosis of the bilateral internal carotid arteries.  Symptoms have improved.  Neurology recommended to start Eliquis.  She will be discharged home today once echo is done and patient is evaluated by PT/OT/SLP and if cleared by neurology with outpatient follow-up with PCP and neurology.  Discharge Diagnoses:   Possible TIA presenting with left-sided facial drooping and right-sided numbness -CT of the head was negative for acute intracranial abnormality.  Neurology was consulted.  MRI of the brain showed no acute intracranial abnormality.  Carotid ultrasound showed 1 to 49% stenosis of the bilateral internal carotid arteries.  Symptoms have improved.   Neurology recommended to start Eliquis.  She will be discharged home today once patient is evaluated by PT/OT/SLP and if cleared by neurology with outpatient follow-up with PCP and neurology. Echo to be completed as an outpatient as per Neurology. -LDL 138.  Continue Lipitor on discharge  Hypertension -Blood pressure stable.  Currently no longer taking amlodipine as an outpatient.  Outpatient follow-up  History of perforated ulcer, gastritis, duodenitis -Patient with an January 2 122.  Continue Protonix.  Outpatient follow-up with PCP.  Hemoglobin is stable.  No contraindications for starting Eliquis at this time.  ?  Paroxysmal A. Fib -There was a question of new onset paroxysmal A. fib causing possible TIA-like symptoms.  Eliquis plan as above.  Outpatient follow-up with cardiology if needed.  Moderate to severe protein calorie malnutrition -Follow nutrition recommendations  History of trigeminal neuralgia -Outpatient follow-up  Memory impairment -Outpatient follow-up with neurology   Discharge Instructions  Discharge Instructions    Ambulatory referral to Neurology   Complete by: As directed    An appointment is requested in approximately: Few weeks for followup of TIA?   Diet - low sodium heart healthy   Complete by: As directed    Increase activity slowly   Complete by: As directed      Allergies as of 05/09/2020      Reactions   Nsaids Other (See Comments)   PERFORATED ULCER      Medication List    STOP taking these medications   amLODipine 5 MG tablet Commonly known as: NORVASC   amoxicillin 500 MG capsule Commonly known as: AMOXIL   clarithromycin 500 MG tablet Commonly known as: BIAXIN   lansoprazole 30 MG capsule Commonly known as: Prevacid   lidocaine 5 % Commonly known as: LIDODERM   QUEtiapine 25 MG tablet Commonly known  as: SEROQUEL   sulfamethoxazole-trimethoprim 800-160 MG tablet Commonly known as: BACTRIM DS     TAKE these medications    acetaminophen 325 MG tablet Commonly known as: TYLENOL Take 650 mg by mouth every 6 (six) hours as needed.   apixaban 5 MG Tabs tablet Commonly known as: ELIQUIS Take 1 tablet (5 mg total) by mouth 2 (two) times daily.   atorvastatin 20 MG tablet Commonly known as: LIPITOR Take 2 tablets (40 mg total) by mouth at bedtime.   Bone Density 300-200 MG-UNIT Tabs Take 1 tablet by mouth 2 (two) times daily.   diphenhydrAMINE 25 MG tablet Commonly known as: BENADRYL Take 25 mg by mouth every 8 (eight) hours as needed.   folic acid 1 MG tablet Commonly known as: FOLVITE Take 1 tablet (1 mg total) by mouth daily.   pantoprazole 40 MG tablet Commonly known as: PROTONIX Take 1 tablet (40 mg total) by mouth 2 (two) times daily.   thiamine 100 MG tablet Take 1 tablet (100 mg total) by mouth daily.       Follow-up Information    [facility]. Schedule an appointment as soon as possible for a visit in 1 week(s).   Why: with repeat cbc/bmp Contact information: [address] [place] [address] [phone]        Neurologist. Schedule an appointment as soon as possible for a visit in 1 week(s).              Allergies  Allergen Reactions  . Nsaids Other (See Comments)    PERFORATED ULCER    Consultations:  Neurology   Procedures/Studies: CT Head Wo Contrast  Result Date: 04/12/2020 CLINICAL DATA:  Altered level of consciousness, history of abdominal pain EXAM: CT HEAD WITHOUT CONTRAST TECHNIQUE: Contiguous axial images were obtained from the base of the skull through the vertex without intravenous contrast. COMPARISON:  02/01/2020 FINDINGS: Brain: Examination was performed following a contrasted CT of the abdomen and pelvis. The remaining intravascular contrast limits evaluation for intraparenchymal hemorrhage. No signs of acute infarct or hemorrhage. Lateral ventricles and midline structures are unremarkable. No acute extra-axial fluid collections. No  mass effect. Vascular: Residual intravascular contrast from preceding CT of the abdomen and pelvis. Stable atherosclerosis. Skull: Normal. Negative for fracture or focal lesion. Sinuses/Orbits: No acute finding. Other: None. IMPRESSION: 1. No acute intracranial process. Electronically Signed   By: Sharlet Salina M.D.   On: 04/12/2020 15:39   MR ANGIO HEAD WO CONTRAST  Result Date: 05/08/2020 CLINICAL DATA:  Neuro deficit, acute, stroke suspected. Right-sided facial droop and numbness. EXAM: MRI HEAD WITHOUT CONTRAST MRA HEAD WITHOUT CONTRAST TECHNIQUE: Multiplanar, multiecho pulse sequences of the brain and surrounding structures were obtained without intravenous contrast. Angiographic images of the head were obtained using MRA technique without contrast. COMPARISON:  CT head without contrast 05/08/2020. MR head without contrast 01/20/2020. FINDINGS: MRI HEAD FINDINGS Brain: The diffusion-weighted images demonstrate no acute or subacute infarction. Moderate atrophy and diffuse white matter disease is similar the prior exam. No acute infarct, hemorrhage, or mass lesion is present. The ventricles are of normal size. No significant extraaxial fluid collection is present. The internal auditory canals are within normal limits. Remote left occipital lobe infarct again noted. Mild white matter changes extend into the brainstem. Brainstem and cerebellum are otherwise within normal limits. Vascular: Flow is present in the major intracranial arteries. Skull and upper cervical spine: The craniocervical junction is normal. Upper cervical spine is within normal limits. Marrow signal  is unremarkable. Sinuses/Orbits: The paranasal sinuses and mastoid air cells are clear. The globes and orbits are within normal limits. MRA HEAD FINDINGS Moderate irregularity is present through the cavernous internal carotid arteries bilaterally without a significant stenosis of greater than 50% relative to the more distal vessels. ICA termini  are intact. A1 and M1 segments are normal. MCA bifurcations are intact. ACA and MCA branch vessels are unremarkable. The left vertebral artery is the dominant vessel. Left PICA origin not discretely visualized. Right PICA origin visualized and normal. Vertebrobasilar junction is normal. Basilar artery is normal. The left posterior cerebral artery is of fetal type. The right posterior cerebral artery originates from basilar tip. P2 segment narrowing is present bilaterally. Asymmetric attenuation of left PCA branch vessels noted. IMPRESSION: 1. No acute intracranial abnormality or significant interval change. 2. Stable moderate atrophy and diffuse white matter disease. 3. Remote left occipital lobe infarct. 4. Moderate medium and small vessel disease bilaterally. 5. Moderate P2 segment stenoses bilaterally with asymmetric attenuation of left PCA branch vessels which correlates with the remote infarct. 6. Medium and distal small vessel disease without significant proximal stenosis or occlusion in the anterior circulation. Electronically Signed   By: Marin Robertshristopher  Mattern M.D.   On: 05/08/2020 17:21   MR BRAIN WO CONTRAST  Result Date: 05/08/2020 CLINICAL DATA:  Neuro deficit, acute, stroke suspected. Right-sided facial droop and numbness. EXAM: MRI HEAD WITHOUT CONTRAST MRA HEAD WITHOUT CONTRAST TECHNIQUE: Multiplanar, multiecho pulse sequences of the brain and surrounding structures were obtained without intravenous contrast. Angiographic images of the head were obtained using MRA technique without contrast. COMPARISON:  CT head without contrast 05/08/2020. MR head without contrast 01/20/2020. FINDINGS: MRI HEAD FINDINGS Brain: The diffusion-weighted images demonstrate no acute or subacute infarction. Moderate atrophy and diffuse white matter disease is similar the prior exam. No acute infarct, hemorrhage, or mass lesion is present. The ventricles are of normal size. No significant extraaxial fluid collection is  present. The internal auditory canals are within normal limits. Remote left occipital lobe infarct again noted. Mild white matter changes extend into the brainstem. Brainstem and cerebellum are otherwise within normal limits. Vascular: Flow is present in the major intracranial arteries. Skull and upper cervical spine: The craniocervical junction is normal. Upper cervical spine is within normal limits. Marrow signal is unremarkable. Sinuses/Orbits: The paranasal sinuses and mastoid air cells are clear. The globes and orbits are within normal limits. MRA HEAD FINDINGS Moderate irregularity is present through the cavernous internal carotid arteries bilaterally without a significant stenosis of greater than 50% relative to the more distal vessels. ICA termini are intact. A1 and M1 segments are normal. MCA bifurcations are intact. ACA and MCA branch vessels are unremarkable. The left vertebral artery is the dominant vessel. Left PICA origin not discretely visualized. Right PICA origin visualized and normal. Vertebrobasilar junction is normal. Basilar artery is normal. The left posterior cerebral artery is of fetal type. The right posterior cerebral artery originates from basilar tip. P2 segment narrowing is present bilaterally. Asymmetric attenuation of left PCA branch vessels noted. IMPRESSION: 1. No acute intracranial abnormality or significant interval change. 2. Stable moderate atrophy and diffuse white matter disease. 3. Remote left occipital lobe infarct. 4. Moderate medium and small vessel disease bilaterally. 5. Moderate P2 segment stenoses bilaterally with asymmetric attenuation of left PCA branch vessels which correlates with the remote infarct. 6. Medium and distal small vessel disease without significant proximal stenosis or occlusion in the anterior circulation. Electronically Signed   By: Cristal Deerhristopher  Mattern M.D.   On: 05/08/2020 17:21   CT ABDOMEN PELVIS W CONTRAST  Result Date: 04/12/2020 CLINICAL  DATA:  Left upper quadrant abdominal pain. Elevated lipase. Generalized weakness. EXAM: CT ABDOMEN AND PELVIS WITH CONTRAST TECHNIQUE: Multidetector CT imaging of the abdomen and pelvis was performed using the standard protocol following bolus administration of intravenous contrast. CONTRAST:  39mL OMNIPAQUE IOHEXOL 300 MG/ML  SOLN COMPARISON:  CT 01/10/2020 FINDINGS: Lower chest: Hypoventilatory changes in the lung bases. No acute airspace disease or pleural effusion. Hepatobiliary: No focal liver abnormality is seen. No gallstones, gallbladder wall thickening, or biliary dilatation. Pancreas: Pancreatic parenchyma is minimally heterogeneous on delayed phase, no ductal dilatation or definite peripancreatic fat stranding. No peripancreatic collection. Spleen: Curvilinear calcifications at the splenic hilum are felt to be vascular. Spleen is normal in size. Adrenals/Urinary Tract: Mild adrenal thickening without focal nodule. No hydronephrosis or perinephric edema. Homogeneous renal enhancement with symmetric excretion on delayed phase imaging. Occasional cortical scarring in the left kidney. Urinary bladder is physiologically distended without wall thickening. Stomach/Bowel: Stomach is distended with ingested contents. There is mild pre pyloric gastric wall thickening, series 2, image 31. Normal positioning of the duodenum and ligament of Treitz. Unremarkable small bowel without obstruction or wall thickening. Normal air-filled appendix. Moderate volume of stool throughout the entire colon. There is distal colonic redundancy. Descending and sigmoid diverticulosis without focal diverticulitis. Mild stool distended rectum. Vascular/Lymphatic: Moderately advanced aortic atherosclerosis. No aortic aneurysm. Noncalcified plaque causes greater than 50% narrowing of the proximal right superficial femoral artery. The left superficial femoral artery is occluded at the origin. Patent portal vein. No bulky abdominopelvic  adenopathy. Reproductive: Uterine atrophy normal for age.  No adnexal mass. Other: No free air, ascites, or focal fluid collection. Musculoskeletal: Avascular necrosis of the left femoral head, chronic. Degenerative change in the lumbar spine. There are no acute or suspicious osseous abnormalities. IMPRESSION: 1. Mild pre pyloric gastric wall thickening, can be seen with gastritis or peptic ulcer disease. 2. Slight heterogeneous pancreatic parenchyma which may represent early pancreatitis. No peripancreatic inflammation. 3. Moderate colonic stool burden with distal colonic redundancy, suggesting constipation. Colonic diverticulosis without diverticulitis. 4. Aortic atherosclerosis as well as peripheral vascular disease. Left superficial femoral artery appears occluded at the origin. Greater than 50% stenosis at the proximal right superficial femoral artery. Recommend correlation for claudication symptoms. Aortic Atherosclerosis (ICD10-I70.0). Electronically Signed   By: Narda Rutherford M.D.   On: 04/12/2020 15:45   US Carotid Bilateral (at [facility] and [facility] only)  Result Date: 05/09/2020 CLINICAL DATA:  Cerebrovascular accident EXAM: BILATERAL CAROTID DUPLEX ULTRASOUND TECHNIQUE: Wallace Cullens scale imaging, color Doppler and duplex ultrasound were performed of bilateral carotid and vertebral arteries in the neck. COMPARISON:  None. FINDINGS: Criteria: Quantification of carotid stenosis is based on velocity parameters that correlate the residual internal carotid diameter with NASCET-based stenosis levels, using the diameter of the distal internal carotid lumen as the denominator for stenosis measurement. The following velocity measurements were obtained: RIGHT ICA: 63/23 cm/sec CCA: 50/15 cm/sec SYSTOLIC ICA/CCA RATIO:  1.3 ECA:  57 cm/sec LEFT ICA: 114/41 cm/sec CCA: 60/18 cm/sec SYSTOLIC ICA/CCA RATIO:  1.9 ECA:  57 cm/sec RIGHT CAROTID ARTERY: Irregular heterogeneous atherosclerotic plaque in the proximal internal carotid  artery. By peak systolic velocity criteria, the estimated stenosis is less than 50%. RIGHT VERTEBRAL ARTERY:  Patent with normal antegrade flow. LEFT CAROTID ARTERY: Trace heterogeneous atherosclerotic plaque in the proximal internal carotid artery. By peak systolic velocity criteria, the estimated stenosis remains less than  50%. LEFT VERTEBRAL ARTERY:  Patent with normal antegrade flow. IMPRESSION: 1. Mild (1-49%) stenosis proximal right internal carotid artery secondary to heterogeneous and irregular/ulcerated atherosclerotic plaque. 2. Mild (1-49%) stenosis proximal left internal carotid artery secondary to focal heterogeneous atherosclerotic plaque. 3. Vertebral arteries are patent with normal antegrade flow. Signed, Sterling Big, MD, RPVI [facility] Radiology Electronically Signed   By: Malachy Moan M.D.   On: 05/09/2020 08:55   DG Chest Port 1 View  Result Date: 05/08/2020 CLINICAL DATA:  Weakness and cough EXAM: PORTABLE CHEST 1 VIEW COMPARISON:  02/01/2020 FINDINGS: The heart size and mediastinal contours are within normal limits. Both lungs are clear. The visualized skeletal structures are unremarkable. IMPRESSION: No active disease. Electronically Signed   By: Deatra Robinson M.D.   On: 05/08/2020 19:14   CT HEAD CODE STROKE WO CONTRAST  Result Date: 05/08/2020 CLINICAL DATA:  Code stroke. New onset facial numbness beginning at 3:30 today. EXAM: CT HEAD WITHOUT CONTRAST TECHNIQUE: Contiguous axial images were obtained from the base of the skull through the vertex without intravenous contrast. COMPARISON:  CT head without contrast 04/12/2020 FINDINGS: Brain: Remote left occipital lobe infarct stable. Mild atrophy and white matter disease is unchanged. Basal ganglia are intact. Insular ribbon is normal. No acute or focal cortical abnormality is present otherwise. The ventricles are of normal size. No significant extraaxial fluid collection is  present. Insert normal brainstem Vascular: Dense atherosclerotic calcifications are again seen cavernous internal carotid arteries bilaterally. No hyperdense vessel is present. Skull: Calvarium is intact. No focal lytic or blastic lesions are present. No significant extracranial soft tissue lesion is present. Sinuses/Orbits: The paranasal sinuses and mastoid air cells are clear. The globes and orbits are within normal limits. ASPECTS [facility] Stroke Program Early CT Score) - Ganglionic level infarction (caudate, lentiform nuclei, internal capsule, insula, M1-M3 cortex): 7/7 - Supraganglionic infarction (M4-M6 cortex): 3/3 Total score (0-10 with 10 being normal): 10/10 IMPRESSION: 1. No acute intracranial abnormality or significant interval change. 2. Stable remote left occipital lobe infarct. 3. Stable atrophy and white matter disease. These results were called by telephone at the time of interpretation on 05/08/2020 at 4:17 pm to provider [facility] , who verbally acknowledged these results. Electronically Signed   By: Marin Roberts M.D.   On: 05/08/2020 16:17      Subjective: Patient seen and examined at bedside.  Feels that her symptoms have improved.  Denies overnight fever, vomiting, chest pain.  Feels okay to go home today.  Discharge Exam: Vitals:   05/09/20 0439 05/09/20 0715  BP: 120/82 127/66  Pulse: 64 (!) 52  Resp: 16 16  Temp: 98 F (36.7 C) 98.6 F (37 C)  SpO2: 92% 100%    General: Pt is alert, awake, not in acute distress Cardiovascular: Mild bradycardia intermittently, S1/S2 + Respiratory: bilateral decreased breath sounds at bases Abdominal: Soft, NT, ND, bowel sounds + Extremities: no edema, no cyanosis    The results of significant diagnostics from this hospitalization (including imaging, microbiology, ancillary and laboratory) are listed below for reference.     Microbiology: Recent Results (from the past 240 hour(s))  Resp Panel by RT-PCR (Flu A&B,  Covid) Nasopharyngeal Swab     Status: None   Collection Time: 05/08/20  6:49 PM   Specimen: Nasopharyngeal Swab; Nasopharyngeal(NP) swabs in vial transport medium  Result Value Ref Range Status   SARS Coronavirus 2 by RT PCR NEGATIVE NEGATIVE Final    Comment: (NOTE) SARS-CoV-2  target nucleic acids are NOT DETECTED.  The SARS-CoV-2 RNA is generally detectable in upper respiratory specimens during the acute phase of infection. The lowest concentration of SARS-CoV-2 viral copies this assay can detect is 138 copies/mL. A negative result does not preclude SARS-Cov-2 infection and should not be used as the sole basis for treatment or other patient management decisions. A negative result may occur with  improper specimen collection/handling, submission of specimen other than nasopharyngeal swab, presence of viral mutation(s) within the areas targeted by this assay, and inadequate number of viral copies(<138 copies/mL). A negative result must be combined with clinical observations, patient history, and epidemiological information. The expected result is Negative.  Fact Sheet for Patients:  BloggerCourse.com  Fact Sheet for Healthcare Providers:  SeriousBroker.it  This test is no t yet approved or cleared by the [place] FDA and  has been authorized for detection and/or diagnosis of SARS-CoV-2 by FDA under an Emergency Use Authorization (EUA). This EUA will remain  in effect (meaning this test can be used) for the duration of the COVID-19 declaration under Section 564(b)(1) of the Act, 21 U.S.C.section 360bbb-3(b)(1), unless the authorization is terminated  or revoked sooner.       Influenza A by PCR NEGATIVE NEGATIVE Final   Influenza B by PCR NEGATIVE NEGATIVE Final    Comment: (NOTE) The Xpert Xpress SARS-CoV-2/FLU/RSV plus assay is intended as an aid in the diagnosis of influenza from Nasopharyngeal swab specimens and should  not be used as a sole basis for treatment. Nasal washings and aspirates are unacceptable for Xpert Xpress SARS-CoV-2/FLU/RSV testing.  Fact Sheet for Patients: BloggerCourse.com  Fact Sheet for Healthcare Providers: SeriousBroker.it  This test is not yet approved or cleared by the [place] FDA and has been authorized for detection and/or diagnosis of SARS-CoV-2 by FDA under an Emergency Use Authorization (EUA). This EUA will remain in effect (meaning this test can be used) for the duration of the COVID-19 declaration under Section 564(b)(1) of the Act, 21 U.S.C. section 360bbb-3(b)(1), unless the authorization is terminated or revoked.  Performed at [facility], [address] [place], [place] [address]      Labs: BNP (last 3 results) No results for input(s): BNP in the last 8760 hours. Basic Metabolic Panel: Recent Labs  Lab 05/08/20 1623  NA 140  K 4.9  CL 107  CO2 25  GLUCOSE 90  BUN 28*  CREATININE 1.08*  CALCIUM 9.9   Liver Function Tests: Recent Labs  Lab 05/08/20 1623  AST 20  ALT 14  ALKPHOS 72  BILITOT 0.4  PROT 7.0  ALBUMIN 3.9   No results for input(s): LIPASE, AMYLASE in the last 168 hours. No results for input(s): AMMONIA in the last 168 hours. CBC: Recent Labs  Lab 05/08/20 1623  WBC 8.3  NEUTROABS 3.9  HGB 13.2  HCT 40.5  MCV 82.0  PLT 372   Cardiac Enzymes: No results for input(s): CKTOTAL, CKMB, CKMBINDEX, TROPONINI in the last 168 hours. BNP: Invalid input(s): POCBNP CBG: Recent Labs  Lab 05/08/20 1600  GLUCAP 174*   D-Dimer No results for input(s): DDIMER in the last 72 hours. Hgb A1c No results for input(s): HGBA1C in the last 72 hours. Lipid Profile Recent Labs    05/09/20 0528  CHOL 234*  HDL 54  LDLCALC 138*  TRIG 208*  CHOLHDL 4.3   Thyroid function studies Recent Labs    05/09/20 0528  TSH 1.183   Anemia work up No results for  input(s): VITAMINB12, FOLATE, FERRITIN, TIBC, IRON, RETICCTPCT in the last 72 hours. Urinalysis    Component Value Date/Time   COLORURINE YELLOW (A) 05/08/2020 1624   APPEARANCEUR HAZY (A) 05/08/2020 1624   LABSPEC 1.018 05/08/2020 1624   PHURINE 5.0 05/08/2020 1624   GLUCOSEU NEGATIVE 05/08/2020 1624   HGBUR NEGATIVE 05/08/2020 1624   BILIRUBINUR NEGATIVE 05/08/2020 1624   BILIRUBINUR neg 03/24/2013 1409   KETONESUR NEGATIVE 05/08/2020 1624   PROTEINUR NEGATIVE 05/08/2020 1624   UROBILINOGEN 0.2 03/24/2013 1409   UROBILINOGEN 0.2 09/24/2008 1045   NITRITE NEGATIVE 05/08/2020 1624   LEUKOCYTESUR NEGATIVE 05/08/2020 1624   Sepsis Labs Invalid input(s): PROCALCITONIN,  WBC,  LACTICIDVEN Microbiology Recent Results (from the past 240 hour(s))  Resp Panel by RT-PCR (Flu A&B, Covid) Nasopharyngeal Swab     Status: None   Collection Time: 05/08/20  6:49 PM   Specimen: Nasopharyngeal Swab; Nasopharyngeal(NP) swabs in vial transport medium  Result Value Ref Range Status   SARS Coronavirus 2 by RT PCR NEGATIVE NEGATIVE Final    Comment: (NOTE) SARS-CoV-2 target nucleic acids are NOT DETECTED.  The SARS-CoV-2 RNA is generally detectable in upper respiratory specimens during the acute phase of infection. The lowest concentration of SARS-CoV-2 viral copies this assay can detect is 138 copies/mL. A negative result does not preclude SARS-Cov-2 infection and should not be used as the sole basis for treatment or other patient management decisions. A negative result may occur with  improper specimen collection/handling, submission of specimen other than nasopharyngeal swab, presence of viral mutation(s) within the areas targeted by this assay, and inadequate number of viral copies(<138 copies/mL). A negative result must be combined with clinical observations, patient history, and epidemiological information. The expected result is Negative.  Fact Sheet for Patients:   BloggerCourse.com  Fact Sheet for Healthcare Providers:  SeriousBroker.it  This test is no t yet approved or cleared by the [place] FDA and  has been authorized for detection and/or diagnosis of SARS-CoV-2 by FDA under an Emergency Use Authorization (EUA). This EUA will remain  in effect (meaning this test can be used) for the duration of the COVID-19 declaration under Section 564(b)(1) of the Act, 21 U.S.C.section 360bbb-3(b)(1), unless the authorization is terminated  or revoked sooner.       Influenza A by PCR NEGATIVE NEGATIVE Final   Influenza B by PCR NEGATIVE NEGATIVE Final    Comment: (NOTE) The Xpert Xpress SARS-CoV-2/FLU/RSV plus assay is intended as an aid in the diagnosis of influenza from Nasopharyngeal swab specimens and should not be used as a sole basis for treatment. Nasal washings and aspirates are unacceptable for Xpert Xpress SARS-CoV-2/FLU/RSV testing.  Fact Sheet for Patients: BloggerCourse.com  Fact Sheet for Healthcare Providers: SeriousBroker.it  This test is not yet approved or cleared by the [place] FDA and has been authorized for detection and/or diagnosis of SARS-CoV-2 by FDA under an Emergency Use Authorization (EUA). This EUA will remain in effect (meaning this test can be used) for the duration of the COVID-19 declaration under Section 564(b)(1) of the Act, 21 U.S.C. section 360bbb-3(b)(1), unless the authorization is terminated or revoked.  Performed at [facility], [address] [place], [place] [address]      Time coordinating discharge: 35 minutes  SIGNED:   Glade Lloyd, MD  [facility] 05/09/2020, 9:44 AM

## 2020-05-09 NOTE — Consult Note (Addendum)
ANTICOAGULATION CONSULT NOTE - Initial Consult  Pharmacy Consult for apixaban Indication: atrial fibrillation  Allergies  Allergen Reactions  . Nsaids Other (See Comments)    PERFORATED ULCER    Patient Measurements: Height: 5\' 3"  (160 cm) Weight: 40 kg (88 lb 2.9 oz) IBW/kg (Calculated) : 52.4  Vital Signs: Temp: 98.6 F (37 C) (05/08 0715) Temp Source: Oral (05/08 0715) BP: 127/66 (05/08 0715) Pulse Rate: 52 (05/08 0715)  Labs: Recent Labs    05/08/20 1623  HGB 13.2  HCT 40.5  PLT 372  APTT 28  LABPROT 12.1  INR 0.9  CREATININE 1.08*    Estimated Creatinine Clearance: 28.4 mL/min (A) (by C-G formula based on SCr of 1.08 mg/dL (H)).   Medical History: Past Medical History:  Diagnosis Date  . Blood transfusion without reported diagnosis   . Depression   . Neuromuscular disorder (HCC)     Medications:  No anticoagulants prior to admission   Assessment: 75 yo F presenting with facial numbness. Code stroke called on presentation. PMH includes HTN, alcohol use disorder, COVID-19 in January 2022, malnutrition, perforated ulcer, and hepatitis C. Pt with new onset atrial fibrillation. CT and MRI concerning for no acute intracranial abnormality or significant interval change, stable moderate atrophy and diffuse white matter disease, remote left occipital lobe infarct. Pharmacy consulted to dose and monitor apixaban for atrial fibrillation.   Goal of Therapy:  Monitor platelets by anticoagulation protocol: Yes   Plan:  Discontinue enoxaparin Start apixaban 5 mg twice daily  Monitor CBC and Scr weekly   07-07-1986, PharmD Pharmacy Resident  05/09/2020 9:08 AM

## 2020-05-09 NOTE — Evaluation (Signed)
Occupational Therapy Evaluation Patient Details Name: Tracy Barton MRN: [id number] DOB: Jan 22, 1945 Today's Date: 05/09/2020    History of Present Illness 75 y.o. female with medical history significant for history of trigeminal neuralgia, hypertension, and memory impairment, who presented with left facial droop and right-sided numbness. CT negative for acute intracranial abnormality and MRI showed no acute intracranial abnormality (however revealed remote left occipital lobe infarct).  Carotid ultrasound showed 1 to 49% stenosis of the bilateral internal carotid arteries.  Symptoms have improved.  Neurology recommended to start Eliquis.   Clinical Impression   Pt seen for OT evaluation this date. Prior to hospital admission, pt reports being independent in ADLs and mobility (without AD). Pt lives with daughter in a 1-level apartment, with >5 steps to enter. Pt demonstrates baseline independence to perform ADLs and mobility tasks, and no strength, sensory, coordination, or visual deficits appreciated with assessment. No additional skilled OT needs identified at this time. Will sign off. Please re-consult if additional OT needs arise.    Follow Up Recommendations  No OT follow up;Supervision - Intermittent    Equipment Recommendations  None recommended by OT       Precautions / Restrictions Precautions Precautions: None Restrictions Weight Bearing Restrictions: No      Mobility Bed Mobility               General bed mobility comments: not assessed, pt in recliner at beginning and end of session    Transfers Overall transfer level: Modified independent Equipment used: None             General transfer comment: Pt able to perform transfers with ease    Balance Overall balance assessment: Needs assistance Sitting-balance support: No upper extremity supported;Feet supported Sitting balance-Leahy Scale: Good     Standing balance support: No upper extremity  supported;During functional activity Standing balance-Leahy Scale: Good Standing balance comment: supervision only for standing bimanual ADLs and functional mobility of household distances without AD                           ADL either performed or assessed with clinical judgement   ADL Overall ADL's : Needs assistance/impaired     Grooming: Wash/dry face;Wash/dry hands;Supervision/safety;Set up;Standing                               Functional mobility during ADLs: Supervision/safety       Vision Baseline Vision/History: Wears glasses Wears Glasses: At all times Patient Visual Report: No change from baseline Vision Assessment?: No apparent visual deficits            Pertinent Vitals/Pain Pain Assessment: No/denies pain     Hand Dominance     Extremity/Trunk Assessment Upper Extremity Assessment Upper Extremity Assessment: Overall WFL for tasks assessed   Lower Extremity Assessment Lower Extremity Assessment: Overall WFL for tasks assessed   Cervical / Trunk Assessment Cervical / Trunk Assessment: Normal   Communication Communication Communication: No difficulties   Cognition Arousal/Alertness: Awake/alert Behavior During Therapy: WFL for tasks assessed/performed Overall Cognitive Status: History of cognitive impairments - at baseline                                 General Comments: Pt pleasant and agreeable. Able to follow 1-step instructions consistently. Pt appearing to mask deficitis regarding PLOF, declining to elaborate  on some PLOF questions      Exercises Other Exercises Other Exercises: Educated on role of OT, POC, and d/c recommendations        Home Living Family/patient expects to be discharged to:: Private residence Living Arrangements: Children Available Help at Discharge: Family;Available PRN/intermittently Type of Home: Apartment Home Access: Stairs to enter Entrance Stairs-Number of Steps: Pt unable  to recall however reports that it is greater than 5 but less than a full flight   Home Layout: One level     Bathroom Shower/Tub: Tub/shower unit         Home Equipment: None          Prior Functioning/Environment Level of Independence: Independent        Comments: Pt reports that she is independent with functional mobility (no AD). Pt reports that she is typically independent with ADLs, however occassionaly requires assistance from family; pt declined to elaborate further. Pt states that her family performs cooking and cleaning.        OT Problem List: Decreased activity tolerance;Decreased strength         OT Goals(Current goals can be found in the care plan section) Acute Rehab OT Goals Patient Stated Goal: to go home OT Goal Formulation: With patient Time For Goal Achievement: 05/23/20 Potential to Achieve Goals: Good  OT Frequency:      AM-PAC OT "6 Clicks" Daily Activity     Outcome Measure Help from another person eating meals?: None Help from another person taking care of personal grooming?: A Little Help from another person toileting, which includes using toliet, bedpan, or urinal?: A Little Help from another person bathing (including washing, rinsing, drying)?: A Little Help from another person to put on and taking off regular upper body clothing?: None Help from another person to put on and taking off regular lower body clothing?: A Little 6 Click Score: 20   End of Session Nurse Communication: Mobility status  Activity Tolerance: Patient tolerated treatment well Patient left: in chair;with call bell/phone within reach  OT Visit Diagnosis: Unsteadiness on feet (R26.81);Muscle weakness (generalized) (M62.81)                Time: 1287-8676 OT Time Calculation (min): 14 min Charges:  OT General Charges $OT Visit: 1 Visit OT Evaluation $OT Eval Moderate Complexity: 1 Mod  Matthew Folks, OTR/L [facility] [phone]

## 2020-05-09 NOTE — TOC Transition Note (Signed)
Transition of Care [facility]) - CM/SW Discharge Note   Patient Details  Name: Tracy Barton MRN: [id number] Date of Birth: 1945/05/18  [facility]) CM/SW Contact:  Hetty Ely, RN Phone Number: 05/09/2020, 12:34 PM   Clinical Narrative:  Patient given Medication Management brochure, I also spoke with daughter on the phone about Medication management, voices understanding. Daughter states that patient is scheduled for PCP visit at the [facility] clinic tomorrow, 05/10/20. TOC barriers resolved.     Final next level of care: Home/Self Care Barriers to Discharge: Barriers Resolved   Patient Goals and CMS Choice Patient states their goals for this hospitalization and ongoing recovery are:: To return home with daughter, Tracy Barton. CMS Medicare.gov Compare Post Acute Care list provided to:: Patient Choice offered to / list presented to : Patient  Discharge Placement                Patient to be transferred to facility by: Daughter to transport home. Name of family member notified: Tracy Barton Patient and family notified of of transfer: 05/09/20  Discharge Plan and Services                DME Arranged: N/A DME Agency: NA       HH Arranged: NA HH Agency: NA        Social Determinants of Health (SDOH) Interventions     Readmission Risk Interventions No flowsheet data found.

## 2020-05-09 NOTE — Progress Notes (Signed)
Neurology Progress Note  Patient ID: Tracy Barton is a 75 y.o. with PMHx of  has a past medical history of Blood transfusion without reported diagnosis, Depression, and Neuromuscular disorder (HCC).  Initially consulted for: Right facial droop  Subjective: Today she reports that she always has some tingling in the right side of her mouth and it never really goes away Feels she is back to her baseline, denies any acute complaints including headache, nausea, vomiting  Exam: Vitals:   05/09/20 0715 05/09/20 1132  BP: 127/66 112/64  Pulse: (!) 52 63  Resp: 16 18  Temp: 98.6 F (37 C) 98 F (36.7 C)  SpO2: 100% 98%   Gen: In bed, comfortable  Resp: non-labored breathing, no grossly audible wheezing Cardiac: Perfusing extremities well  Abd: soft, nt  Neuro: MS: Alert, awake, interactive, did not attempt to answer my questions about the date or year. CN: Mild right facial droop at rest, symmetric with activation.  Tongue midline, tracks examiner in all directions, pupils equal round and reactive Motor: No pronator drift Sensory: Intact to light touch throughout   Pertinent Labs:  Lab Results  Component Value Date   HGBA1C 5.0 01/12/2020    Lab Results  Component Value Date   CHOL 234 (H) 05/09/2020   HDL 54 05/09/2020   LDLCALC 138 (H) 05/09/2020   TRIG 208 (H) 05/09/2020   CHOLHDL 4.3 05/09/2020   Personally reviewed MRI brain and MRA MRI brain without acute intracranial process MRA without large vessel occlusion, but there is some chronic atherosclerotic change "IMPRESSION: 1. No acute intracranial abnormality or significant interval change. 2. Stable moderate atrophy and diffuse white matter disease. 3. Remote left occipital lobe infarct. 4. Moderate medium and small vessel disease bilaterally. 5. Moderate P2 segment stenoses bilaterally with asymmetric attenuation of left PCA branch vessels which correlates with the remote infarct. 6. Medium and distal  small vessel disease without significant proximal stenosis or occlusion in the anterior circulation."  Cartoid ultrasound: IMPRESSION: 1. Mild (1-49%) stenosis proximal right internal carotid artery secondary to heterogeneous and irregular/ulcerated atherosclerotic plaque. 2. Mild (1-49%) stenosis proximal left internal carotid artery secondary to focal heterogeneous atherosclerotic plaque. 3. Vertebral arteries are patent with normal antegrade flow.  Unresulted Labs (From admission, onward)          Start     Ordered   05/10/20 0500  CBC  Tomorrow morning,   R       Question:  Specimen collection method  Answer:  Lab=Lab collect   05/09/20 0928   05/10/20 0500  Creatinine, serum  Tomorrow morning,   R       Question:  Specimen collection method  Answer:  Lab=Lab collect   05/09/20 0928   05/09/20 0500  Hemoglobin A1c  (Labs)  Tomorrow morning,   STAT        05/08/20 1809   05/09/20 0500  Vitamin B12  Tomorrow morning,   STAT        05/08/20 1846         Impression: TIA in the setting of new onset atrial fibrillation, now stable and on anticoagulation.  Recommendations: # TIA  - HgbA1c 5%, fasting lipid panel notable for LDL 138, total cholesterol 234 - Start atorvastatin 40 mg nightly - Anticoagulation, agent choice per primary team given history of gastric ulcer - Please do not start patient on antiplatelet agents if she is started on anticoagulation unless there is a cardiac indication for both anticoagulation and antiplatelet agent  such as coronary artery disease - Risk factor modification, diet, exercise, smoking cessation counseling - Telemetry monitoring while inpatient - Blood pressure goal              - Normotension -No need for inpatient echocardiogram given it would not change management from a neurological perspective (patient already on anticoagulation for atrial fibrillation).  This can be completed on an outpatient basis  # Memory loss -B12 level pending,  TSH, RPR, HIV negative work-up of reversible causes   -B12 level can be followed by PCP -Continue home thiamine 100 mg daily given known alcohol use history -Continue planned outpatient follow-up per family  Brooke Dare MD-PhD [facility] [phone]  [facility] coverage for [facility] is from 8 AM to 4 AM in-house and 4 PM to 8 PM by telephone/video. 8 PM to 8 AM emergent questions or overnight urgent questions should be addressed to Teleneurology On-call or [facility] neurohospitalist; contact information can be found on AMION

## 2020-05-09 NOTE — Progress Notes (Signed)
Received MD order to discharge patient to home with home health, reviewed home meds, prescriptions , discharge instructions and follow up appointments with patient and  her daughter Franchot Erichsen and they verbalized understanding

## 2020-05-09 NOTE — Evaluation (Addendum)
Physical Therapy Evaluation Patient Details Name: Tracy Barton MRN: [id number] DOB: 10-20-1945 Today's Date: 05/09/2020   History of Present Illness  Per chart pt is a 75 y.o. female with medical history significant for history of trigeminal neuralgia, hypertension, and memory impairment. Pt presented to ED with left facial droop and right-sided numbness. CT negative for acute intracranial abnormality MRI showed no acute intracranial abnormality (however revealed remote left occipital lobe infarct.).  Carotid ultrasound showed 1 to 49% stenosis of the bilateral internal carotid arteries.  Symptoms have improved.  Neurology recommended to start Eliquis.    Clinical Impression  Pt found seated in recliner at beginning of PT evaluation. Pt reports memory impairment, so pt poor historian throughout session. Pt reports PLOF as needing some assistance with ADLs  and that she ambulated without an AD. However, pt reports history of recent falls, and states she fell two times recently. She does not provide further information. Pt reports she has two daughters that help her at home, and it is unclear if she is currently living with one of her daughters. Pt lives in apartment and has steps to enter home but is unsure as to how many. She reports she does have a handrail to hold onto. Per pt reports, she currently presents at baseline level. However, pt with questionable dynamic balance as evidenced by poor gait mechanics when she tried to ambulate without an AD. PT recommends she use RW as pt shows improved gait mechanics and balance with RW (ambulated around nurses station). Pt was able to ascend/descend 5 steps with use of handrail, supervision, with no decrease in postural stability or LOB. Pt in recliner at end of session with call-bell in reach. Pt would benefit from home health PT.   Follow Up Recommendations Home health PT    Equipment Recommendations  Rolling walker with 5" wheels     Recommendations for Other Services       Precautions / Restrictions Precautions Precautions: None Restrictions Weight Bearing Restrictions: No      Mobility  Bed Mobility               General bed mobility comments: not assessed, pt in recliner at beginning and end of session    Transfers Overall transfer level: Modified independent (takes extra time) Equipment used: None             General transfer comment: Pt mod I with transfers (takes more time), no decrease in postural stability or LOB  Ambulation/Gait Ambulation/Gait assistance: Modified independent (Device/Increase time);Supervision (Pt reoprts ambulating without AD, but with poor gait compared to with RW (without AD decreased gait speed, decreased B step length)) Gait Distance (Feet): 180 Feet Assistive device: Rolling walker (2 wheeled) Gait Pattern/deviations: WFL(Within Functional Limits) Gait velocity: Pt gait speed not formally assessed. However, she exhibits improved gait speed/mechanics when using RW.   General Gait Details: Pt reports ambulating without AD as PLOF, but exhibits poor gait mechanics compared to ambulating with RW (without AD decreased gait speed, decreased B step length). Pt briefly ambulated from chair to bathroom with hand-held assist. She then ambulated to stairwell and around nursing station back to room with RW.  Stairs Stairs: Yes Stairs assistance: Min guard Stair Management: One rail Right;One rail Left;Alternating pattern Number of Stairs: 5 General stair comments: No LOB of decrease in postural stability observed. Pt able to ascend/descend steps wtih alternating pattern.  Wheelchair Mobility    Modified Rankin (Stroke Patients Only)  Balance Overall balance assessment: Needs assistance (PT provided hand-held assist when pt attempted to ambulate to bathroom without an AD) Sitting-balance support: No upper extremity supported Sitting balance-Leahy Scale:  Normal     Standing balance support: No upper extremity supported;During functional activity Standing balance-Leahy Scale: Good Standing balance comment: supervision only when pt standing without performing more dynamic tasks                             Pertinent Vitals/Pain Pain Assessment: No/denies pain    Home Living Family/patient expects to be discharged to:: Private residence Living Arrangements: Children (Pt reports her two daughters help her) Available Help at Discharge: Family;Available PRN/intermittently Type of Home: Apartment Home Access: Stairs to enter Entrance Stairs-Rails:  (pt unable to report which side rail is on d/t memory impairment (not new)) Entrance Stairs-Number of Steps: Pt unable to recall however reports that it is greater than 5 but less than a full flight, she reports she has a handrail, however pt is a poor historian due to memory impairment Home Layout: One level Home Equipment: None Additional Comments: Pt reports memory impairment stating, "I have some dementia." Pt poor historian. No family current present to confirm home equipment, living arrangement.    Prior Function Level of Independence: Independent         Comments: Pt poor historian and provides different information from earlier. She states PLOF is she needs some assistance with ADLs. She does continue to report her PLOF is ambulating without an AD as well as performing other functional mobility without an AD. Pt states that her family performs cooking and cleaning.     Hand Dominance   Dominant Hand: Right    Extremity/Trunk Assessment   Upper Extremity Assessment Upper Extremity Assessment: Defer to OT evaluation    Lower Extremity Assessment Lower Extremity Assessment: Overall WFL for tasks assessed    Cervical / Trunk Assessment Cervical / Trunk Assessment: Normal  Communication   Communication: No difficulties  Cognition Arousal/Alertness:  Awake/alert Behavior During Therapy: WFL for tasks assessed/performed Overall Cognitive Status: History of cognitive impairments - at baseline                                 General Comments: Pt pleasant and agreeable. Pt continues to follow 1-step instructions consistently. Pt poor historian due to memory impairment.      General Comments General comments (skin integrity, edema, etc.): PT provided hand-held assist as pt ambulated from recliner to bathroom (pt reports history of falls as well). Pt without decrease in postural stability when jsut standing, however, when she initiated steps without AD she shows poor gait mechanics, possibly indicating increased fall risk if she attempts to ambulate for distances without an AD. PT then provided pt with RW for further ambulation    Exercises Other Exercises Other Exercises: Educated on role of PT, POC, and d/c recommendations, importance of use of AD/RW for gait and safety, safe technique with STS   Assessment/Plan    PT Assessment Patient needs continued PT services (pt would benefit from [facility] PT)  PT Problem List Decreased strength;Decreased mobility;Decreased balance       PT Treatment Interventions DME instruction;Gait training;Stair training;Functional mobility training;Therapeutic activities;Therapeutic exercise;Balance training;Neuromuscular re-education;Patient/family education    PT Goals (Current goals can be found in the Care Plan section)  Acute Rehab PT Goals Patient Stated Goal: to go  home, to get better PT Goal Formulation: With patient Time For Goal Achievement: 05/23/20 Potential to Achieve Goals: Good    Frequency Min 2X/week   Barriers to discharge        Co-evaluation               AM-PAC PT "6 Clicks" Mobility  Outcome Measure Help needed turning from your back to your side while in a flat bed without using bedrails?: A Little Help needed moving from lying on your back to sitting on the  side of a flat bed without using bedrails?: A Little Help needed moving to and from a bed to a chair (including a wheelchair)?: A Little Help needed standing up from a chair using your arms (e.g., wheelchair or bedside chair)?: None (increased time) Help needed to walk in hospital room?: A Little Help needed climbing 3-5 steps with a railing? : A Little (supervision) 6 Click Score: 19    End of Session Equipment Utilized During Treatment: Gait belt Activity Tolerance: Patient tolerated treatment well Patient left: in chair;with call bell/phone within reach Nurse Communication: Mobility status PT Visit Diagnosis: Other abnormalities of gait and mobility (R26.89);History of falling (Z91.81);Unsteadiness on feet (R26.81);Muscle weakness (generalized) (M62.81)    Time: 6644-0347 PT Time Calculation (min) (ACUTE ONLY): 33 min   Charges:   PT Evaluation $PT Eval Low Complexity: 1 Low PT Treatments $Gait Training: 8-22 mins $Therapeutic Activity: 8-22 mins        Temple Pacini PT, DPT  05/09/2020, 2:45 PM

## 2020-05-12 DIAGNOSIS — R634 Abnormal weight loss: Secondary | ICD-10-CM | POA: Diagnosis not present

## 2020-05-12 DIAGNOSIS — Z1389 Encounter for screening for other disorder: Secondary | ICD-10-CM | POA: Diagnosis not present

## 2020-05-12 DIAGNOSIS — Z8679 Personal history of other diseases of the circulatory system: Secondary | ICD-10-CM | POA: Diagnosis not present

## 2020-05-12 DIAGNOSIS — G3184 Mild cognitive impairment, so stated: Secondary | ICD-10-CM | POA: Diagnosis not present

## 2020-05-26 DIAGNOSIS — Z8673 Personal history of transient ischemic attack (TIA), and cerebral infarction without residual deficits: Secondary | ICD-10-CM | POA: Diagnosis not present

## 2020-05-26 DIAGNOSIS — R413 Other amnesia: Secondary | ICD-10-CM | POA: Diagnosis not present

## 2020-05-26 DIAGNOSIS — R2689 Other abnormalities of gait and mobility: Secondary | ICD-10-CM | POA: Diagnosis not present

## 2020-10-04 ENCOUNTER — Other Ambulatory Visit: Payer: Self-pay

## 2020-10-04 ENCOUNTER — Emergency Department
Admission: EM | Admit: 2020-10-04 | Discharge: 2020-10-04 | Disposition: A | Discharge status: Home / Self Care | Payer: Medicare Other | Attending: Emergency Medicine | Admitting: Emergency Medicine

## 2020-10-04 DIAGNOSIS — R55 Syncope and collapse: Secondary | ICD-10-CM | POA: Insufficient documentation

## 2020-10-04 DIAGNOSIS — I491 Atrial premature depolarization: Secondary | ICD-10-CM | POA: Diagnosis not present

## 2020-10-04 DIAGNOSIS — Z8616 Personal history of COVID-19: Secondary | ICD-10-CM | POA: Diagnosis not present

## 2020-10-04 DIAGNOSIS — F17210 Nicotine dependence, cigarettes, uncomplicated: Secondary | ICD-10-CM | POA: Diagnosis not present

## 2020-10-04 DIAGNOSIS — Z7901 Long term (current) use of anticoagulants: Secondary | ICD-10-CM | POA: Insufficient documentation

## 2020-10-04 DIAGNOSIS — I10 Essential (primary) hypertension: Secondary | ICD-10-CM | POA: Diagnosis not present

## 2020-10-04 LAB — CBC
HCT: 39.6 % (ref 36.0–46.0)
Hemoglobin: 12.9 g/dL (ref 12.0–15.0)
MCH: 28 pg (ref 26.0–34.0)
MCHC: 32.6 g/dL (ref 30.0–36.0)
MCV: 86.1 fL (ref 80.0–100.0)
Platelets: 304 10*3/uL (ref 150–400)
RBC: 4.6 MIL/uL (ref 3.87–5.11)
RDW: 14 % (ref 11.5–15.5)
WBC: 8.9 10*3/uL (ref 4.0–10.5)
nRBC: 0 % (ref 0.0–0.2)

## 2020-10-04 LAB — BASIC METABOLIC PANEL
Anion gap: 8 (ref 5–15)
BUN: 12 mg/dL (ref 8–23)
CO2: 20 mmol/L — ABNORMAL LOW (ref 22–32)
Calcium: 7.6 mg/dL — ABNORMAL LOW (ref 8.9–10.3)
Chloride: 113 mmol/L — ABNORMAL HIGH (ref 98–111)
Creatinine, Ser: 1.04 mg/dL — ABNORMAL HIGH (ref 0.44–1.00)
GFR, Estimated: 56 mL/min — ABNORMAL LOW (ref >60)
Glucose, Bld: 90 mg/dL (ref 70–99)
Potassium: 3.5 mmol/L (ref 3.5–5.1)
Sodium: 141 mmol/L (ref 135–145)

## 2020-10-04 LAB — TROPONIN I (HIGH SENSITIVITY): Troponin I (High Sensitivity): 4 ng/L (ref <18)

## 2020-10-04 MED ORDER — SODIUM CHLORIDE 0.9 % IV BOLUS
1000.0000 mL | Freq: Once | INTRAVENOUS | Status: AC
  Administered 2020-10-04: 1000 mL via INTRAVENOUS

## 2020-10-04 NOTE — ED Notes (Signed)
Pt had a dizzy episode this morning and felt like they were going to pass out.Pt states that the dizzy spells are reoccurring. Pt denies any NVD and no pain. Pt is alert and NAD.

## 2020-10-04 NOTE — ED Notes (Signed)
Lab called to add on Troponin  

## 2020-10-04 NOTE — ED Provider Notes (Signed)
[facility] Emergency Department Provider Note   ____________________________________________   Event Date/Time   First MD Initiated Contact with Patient 10/04/20 1157     (approximate)  I have reviewed the triage vital signs and the nursing notes.   HISTORY  Chief Complaint Near Syncope    HPI Tracy Barton is a 75 y.o. female with past medical history of dementia, hypertension, peptic ulcer disease, chronic hepatitis C, PACs, and trigeminal neuralgia who presents to the ED for near syncope.  Majority of history is obtained from patient's daughter, who stated that the patient began to feel very lightheaded when she was getting ready this morning and putting on her close.  Patient felt like she might pass out, began to feel nauseous with some discomfort in her upper abdomen.  She denies any pain in her chest or difficulty breathing, states nausea and abdominal discomfort have now resolved.  Daughter reports that patient has had similar episodes in the past for many years.  Patient recently admitted for TIA and there was concern that she could have atrial fibrillation, subsequently started on Eliquis but this was stopped about 1 month ago by her cardiologist.        Past Medical History:  Diagnosis Date   Blood transfusion without reported diagnosis    Depression    Neuromuscular disorder [facility])     Patient Active Problem List   Diagnosis Date Noted   CVA (cerebral vascular accident) (HCC) 05/08/2020   Tobacco use 05/08/2020   Syncope 02/01/2020   COVID-19 02/01/2020   Sinus bradycardia on ECG 02/01/2020   H pylori ulcer 01/23/2020   Acute metabolic encephalopathy 01/23/2020   Chronic alcohol abuse 01/23/2020   Hypokalemia 01/23/2020   Hypophosphatemia 01/23/2020   Acute blood loss anemia 01/23/2020   Essential hypertension 01/23/2020   Protein-calorie malnutrition, severe 01/15/2020   Perforated prepyloric gastric ulcer s/p omental Cheree Ditto  patch 01/10/2020 01/10/2020   History of medication noncompliance 01/10/2020   ARF (acute renal failure) (HCC) 01/10/2020   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   Hep C w/o coma, chronic (HCC) 09/12/2014   Head injury 09/11/2014   Back injury 04/15/2014   Smoker 03/24/2013   Gastritis and gastroduodenitis 03/24/2013   Loss of weight 03/24/2013   Unspecified vitamin D deficiency 03/24/2013   Trigeminal neuralgia 06/01/2011    Past Surgical History:  Procedure Laterality Date   BRAIN SURGERY     LAPAROSCOPY N/A 01/10/2020   Procedure: LAPAROSCOPY DIAGNOSTIC laparoscopic washout omental patch splenectomy;  Surgeon: Karie Soda, MD;  Location: [facility] ORS;  Service: General;  Laterality: N/A;   LAPAROTOMY N/A 01/10/2020   Procedure: EXPLORATORY LAPAROTOMY;  Surgeon: Karie Soda, MD;  Location: [facility] ORS;  Service: General;  Laterality: N/A;   TUBAL LIGATION      Prior to Admission medications   Medication Sig Start Date End Date Taking? Authorizing Provider  acetaminophen (TYLENOL) 325 MG tablet Take 650 mg by mouth every 6 (six) hours as needed.    [provider]  apixaban (ELIQUIS) 5 MG TABS tablet Take 1 tablet (5 mg total) by mouth 2 (two) times daily. 05/09/20   Glade Lloyd, MD  atorvastatin (LIPITOR) 40 MG tablet Take 1 tablet (40 mg total) by mouth at bedtime. 05/09/20 06/08/20  Glade Lloyd, MD  Calcium-Vit D-Arg-Inos-Silicon (BONE DENSITY) 300-200 MG-UNIT TABS Take 1 tablet by mouth 2 (two) times daily. Patient not taking: No sig reported 12/02/12   Carmelina Dane, MD  diphenhydrAMINE (BENADRYL) 25 MG tablet  Take 25 mg by mouth every 8 (eight) hours as needed.    [provider]  folic acid (FOLVITE) 1 MG tablet Take 1 tablet (1 mg total) by mouth daily. Patient not taking: Reported on 05/08/2020 01/24/20   Rama, Maryruth Bun, MD  pantoprazole (PROTONIX) 40 MG tablet Take 1 tablet (40 mg total) by mouth 2 (two) times daily. Patient not taking: Reported on 05/08/2020  01/23/20   Rama, Maryruth Bun, MD  thiamine 100 MG tablet Take 1 tablet (100 mg total) by mouth daily. 01/24/20   Rama, Maryruth Bun, MD    Allergies Nsaids  Family History  Problem Relation Age of Onset   Stroke Mother    Cancer Mother    Cancer Brother    Mental illness Maternal Grandmother     Social History Social History   Tobacco Use   Smoking status: Some Days    Packs/day: 0.50    Years: 53.00    Pack years: 26.50    Types: Cigarettes   Smokeless tobacco: Never  Vaping Use   Vaping Use: Never used  Substance Use Topics   Alcohol use: Yes    Alcohol/week: 2.0 standard drinks    Types: 2 Cans of beer per week   Drug use: No    Review of Systems  Constitutional: No fever/chills Eyes: No visual changes. ENT: No sore throat. Cardiovascular: Denies chest pain.  Positive for lightheadedness and near syncope. Respiratory: Denies shortness of breath. Gastrointestinal: No abdominal pain.  No nausea, no vomiting.  No diarrhea.  No constipation. Genitourinary: Negative for dysuria. Musculoskeletal: Negative for back pain. Skin: Negative for rash. Neurological: Negative for headaches, focal weakness or numbness.  ____________________________________________   PHYSICAL EXAM:  VITAL SIGNS: ED Triage Vitals  Enc Vitals Group     BP 10/04/20 0933 115/62     Pulse Rate 10/04/20 0933 (!) 54     Resp 10/04/20 0933 18     Temp 10/04/20 0933 98 F (36.7 C)     Temp src --      SpO2 10/04/20 0933 98 %     Weight --      Height --      Head Circumference --      Peak Flow --      Pain Score 10/04/20 0932 0     Pain Loc --      Pain Edu? --      Excl. in GC? --     Constitutional: Alert and oriented. Eyes: Conjunctivae are normal. Head: Atraumatic. Nose: No congestion/rhinnorhea. Mouth/Throat: Mucous membranes are moist. Neck: Normal ROM Cardiovascular: Normal rate, irregularly irregular rhythm. Grossly normal heart sounds.  2+ radial pulses  bilaterally. Respiratory: Normal respiratory effort.  No retractions. Lungs CTAB. Gastrointestinal: Soft and nontender. No distention. Genitourinary: deferred Musculoskeletal: No lower extremity tenderness nor edema. Neurologic:  Normal speech and language. No gross focal neurologic deficits are appreciated. Skin:  Skin is warm, dry and intact. No rash noted. Psychiatric: Mood and affect are normal. Speech and behavior are normal.  ____________________________________________   LABS (all labs ordered are listed, but only abnormal results are displayed)  Labs Reviewed  BASIC METABOLIC PANEL - Abnormal; Notable for the following components:      Result Value   Chloride 113 (*)    CO2 20 (*)    Creatinine, Ser 1.04 (*)    Calcium 7.6 (*)    GFR, Estimated 56 (*)    All other components within normal limits  CBC  URINALYSIS, COMPLETE (UACMP) WITH MICROSCOPIC  CBG MONITORING, ED  TROPONIN I (HIGH SENSITIVITY)   ____________________________________________  EKG  ED ECG REPORT I, Chesley Noon, the attending physician, personally viewed and interpreted this ECG.   Date: 10/04/2020  EKG Time: 9:36  Rate: 64  Rhythm: atrial fibrillation  Axis: Normal  Intervals:none  ST&T Change: None  ED ECG REPORT I, Chesley Noon, the attending physician, personally viewed and interpreted this ECG.   Date: 10/04/2020  EKG Time: 12:27  Rate: 54  Rhythm: sinus bradycardia, PAC's noted  Axis: Normal  Intervals:none  ST&T Change: None    PROCEDURES  Procedure(s) performed (including Critical Care):  Procedures   ____________________________________________   INITIAL IMPRESSION / ASSESSMENT AND PLAN / ED COURSE      75 year old female with past medical history of hypertension, peptic ulcer disease, chronic hepatitis C, trigeminal neuralgia, and dementia who presents to the ED following episode of lightheadedness and near syncope associated with nausea and abdominal  discomfort.  Symptoms have now resolved, patient awake and alert with no complaints at this time.  Initial EKG showed irregular rhythm with questionable atrial fibrillation but no ischemic changes.  This was reviewed with Dr. Beatrix Fetters of cardiology, who recommends repeat EKG that clearly shows sinus bradycardia with frequent PACs.  Labs thus far are unremarkable, we will add on troponin given patient's fleeting abdominal discomfort and nausea.  If this is unremarkable, patient would be appropriate for discharge home with close cardiology follow-up given her history of numerous similar episodes in the past.  Troponin within normal limits, patient remains asymptomatic here in the ED.  She is appropriate for discharge home with cardiology and PCP appointment, daughter states she has a PCP appointment in 3 days.  Daughter counseled to have patient return to the ED for new worsening symptoms, patient and daughter agree with plan.      ____________________________________________   FINAL CLINICAL IMPRESSION(S) / ED DIAGNOSES  Final diagnoses:  Near syncope  PAC (premature atrial contraction)     ED Discharge Orders     None        Note:  This document was prepared using Dragon voice recognition software and may include unintentional dictation errors.    Chesley Noon, MD 10/04/20 [phone]

## 2020-10-04 NOTE — ED Triage Notes (Signed)
Pt comes into the ED via EMS from home states she was witnessed sitting in her chair this morning and having a syncopal episode sliding Into the floor, denies any injury. Pt has a hx of dementia  144/69 HR54 98%RA CBG132 97.5 temp #20gRFA NS infused

## 2020-10-04 NOTE — ED Notes (Signed)
EDP at bedside  

## 2020-10-04 NOTE — ED Triage Notes (Signed)
Pt comes with c/o syncopal episode while sitting in her chair. Pt slide onto the floor. Pt denies any pain or injuries.  Pt has hx of dementia.  Pt is Alert and able to answer questions at this time.

## 2021-03-25 ENCOUNTER — Other Ambulatory Visit: Payer: Self-pay

## 2021-03-25 ENCOUNTER — Emergency Department: Payer: Medicare Other

## 2021-03-25 ENCOUNTER — Inpatient Hospital Stay
Admission: EM | Admit: 2021-03-25 | Discharge: 2021-03-27 | DRG: 308 | Disposition: A | Discharge status: Home Health | Payer: Medicare Other | Attending: Internal Medicine | Admitting: Internal Medicine

## 2021-03-25 ENCOUNTER — Encounter: Payer: Self-pay | Admitting: Emergency Medicine

## 2021-03-25 DIAGNOSIS — I951 Orthostatic hypotension: Secondary | ICD-10-CM | POA: Diagnosis present

## 2021-03-25 DIAGNOSIS — F17210 Nicotine dependence, cigarettes, uncomplicated: Secondary | ICD-10-CM | POA: Diagnosis present

## 2021-03-25 DIAGNOSIS — R001 Bradycardia, unspecified: Principal | ICD-10-CM

## 2021-03-25 DIAGNOSIS — Z886 Allergy status to analgesic agent status: Secondary | ICD-10-CM

## 2021-03-25 DIAGNOSIS — F03A3 Unspecified dementia, mild, with mood disturbance: Secondary | ICD-10-CM | POA: Diagnosis present

## 2021-03-25 DIAGNOSIS — E43 Unspecified severe protein-calorie malnutrition: Secondary | ICD-10-CM | POA: Diagnosis present

## 2021-03-25 DIAGNOSIS — Z8673 Personal history of transient ischemic attack (TIA), and cerebral infarction without residual deficits: Secondary | ICD-10-CM | POA: Diagnosis not present

## 2021-03-25 DIAGNOSIS — E785 Hyperlipidemia, unspecified: Secondary | ICD-10-CM | POA: Diagnosis present

## 2021-03-25 DIAGNOSIS — G4700 Insomnia, unspecified: Secondary | ICD-10-CM | POA: Diagnosis present

## 2021-03-25 DIAGNOSIS — Z681 Body mass index (BMI) 19 or less, adult: Secondary | ICD-10-CM

## 2021-03-25 DIAGNOSIS — R55 Syncope and collapse: Secondary | ICD-10-CM | POA: Diagnosis present

## 2021-03-25 DIAGNOSIS — Z79899 Other long term (current) drug therapy: Secondary | ICD-10-CM

## 2021-03-25 DIAGNOSIS — Z823 Family history of stroke: Secondary | ICD-10-CM

## 2021-03-25 LAB — COMPREHENSIVE METABOLIC PANEL
ALT: 17 U/L (ref 0–44)
AST: 23 U/L (ref 15–41)
Albumin: 3.4 g/dL — ABNORMAL LOW (ref 3.5–5.0)
Alkaline Phosphatase: 75 U/L (ref 38–126)
Anion gap: 8 (ref 5–15)
BUN: 16 mg/dL (ref 8–23)
CO2: 25 mmol/L (ref 22–32)
Calcium: 9.2 mg/dL (ref 8.9–10.3)
Chloride: 107 mmol/L (ref 98–111)
Creatinine, Ser: 1.28 mg/dL — ABNORMAL HIGH (ref 0.44–1.00)
GFR, Estimated: 43 mL/min — ABNORMAL LOW (ref >60)
Glucose, Bld: 159 mg/dL — ABNORMAL HIGH (ref 70–99)
Potassium: 3.4 mmol/L — ABNORMAL LOW (ref 3.5–5.1)
Sodium: 140 mmol/L (ref 135–145)
Total Bilirubin: 0.7 mg/dL (ref 0.3–1.2)
Total Protein: 6.4 g/dL — ABNORMAL LOW (ref 6.5–8.1)

## 2021-03-25 LAB — CBC
HCT: 37.5 % (ref 36.0–46.0)
Hemoglobin: 11.8 g/dL — ABNORMAL LOW (ref 12.0–15.0)
MCH: 26.6 pg (ref 26.0–34.0)
MCHC: 31.5 g/dL (ref 30.0–36.0)
MCV: 84.7 fL (ref 80.0–100.0)
Platelets: 282 10*3/uL (ref 150–400)
RBC: 4.43 MIL/uL (ref 3.87–5.11)
RDW: 13.3 % (ref 11.5–15.5)
WBC: 6 10*3/uL (ref 4.0–10.5)
nRBC: 0 % (ref 0.0–0.2)

## 2021-03-25 LAB — TROPONIN I (HIGH SENSITIVITY)
Troponin I (High Sensitivity): 6 ng/L (ref <18)
Troponin I (High Sensitivity): 7 ng/L (ref <18)

## 2021-03-25 IMAGING — CT CT HEAD W/O CM
4 series · 16 of 47 positions shown, 18 images · non-contrast
Comparison: [DATE]

CLINICAL DATA: Syncope



[Series 2: head wo · axial · 0.43mm/px · z∈[-135,-30]mm · 7 of 29 slices shown, 9 images]
[im 4/29  brain]
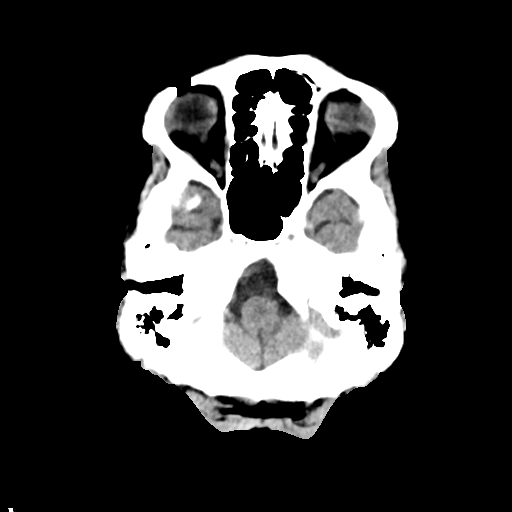
[im 4/29  bone]
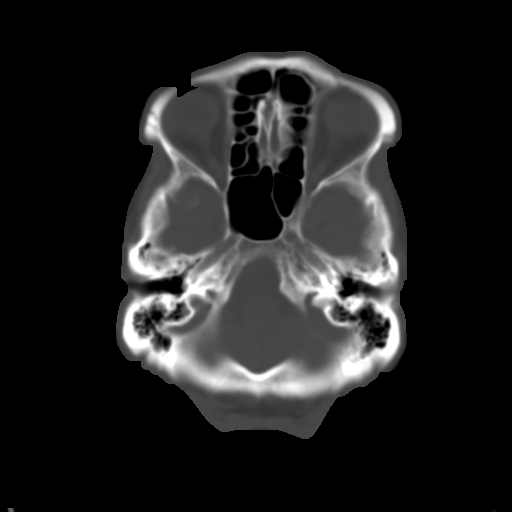
[im 8/29  brain]
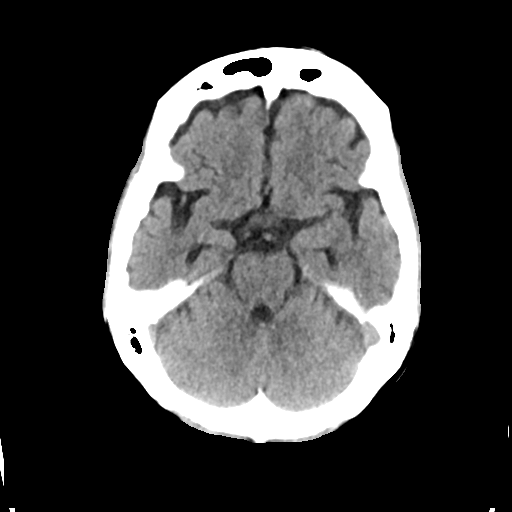
[im 11/29  brain]
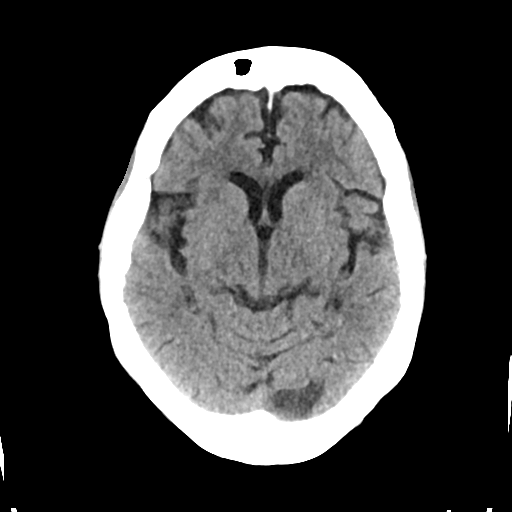
[im 15/29  brain]
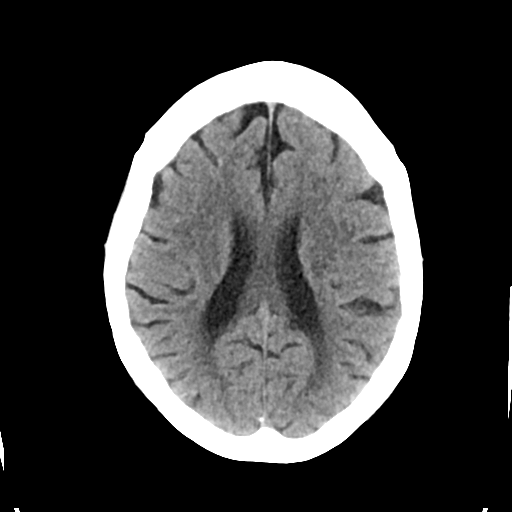
[im 18/29  brain]
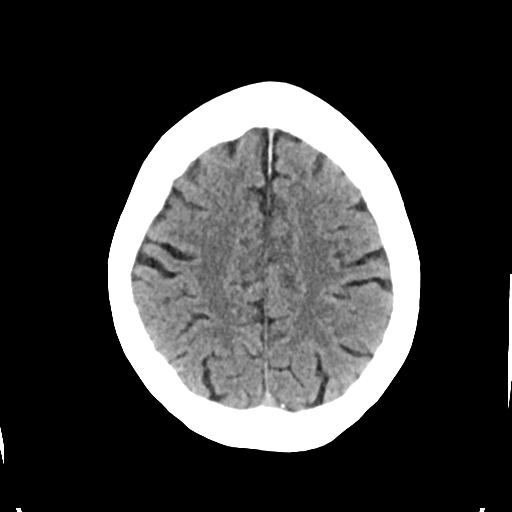
[im 18/29  bone]
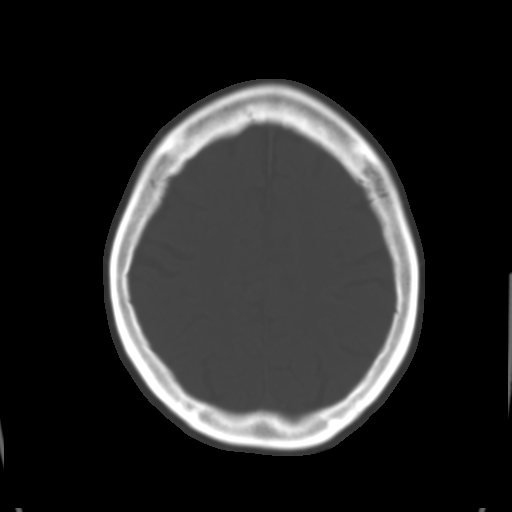
[im 22/29  brain]
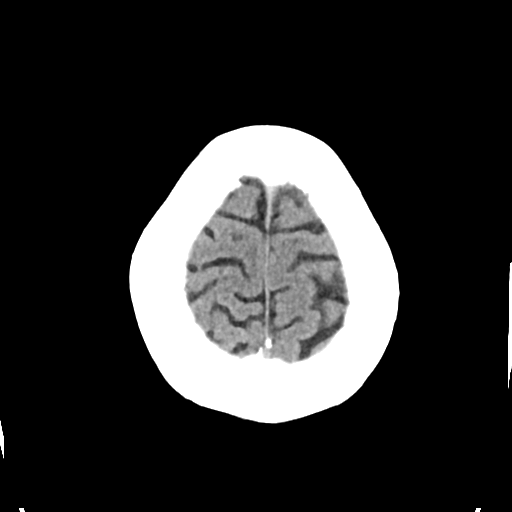
[im 25/29  brain]
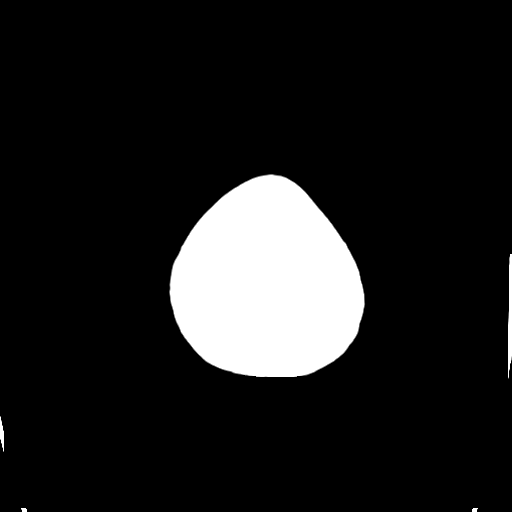

[Series 3: head bone · axial · 0.43mm/px · z∈[-136,-108]mm · 3 of 73 slices shown]
[im 8/73  bone]
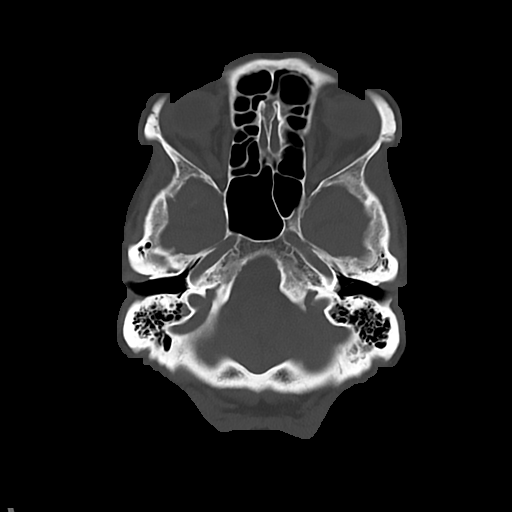
[im 15/73  bone]
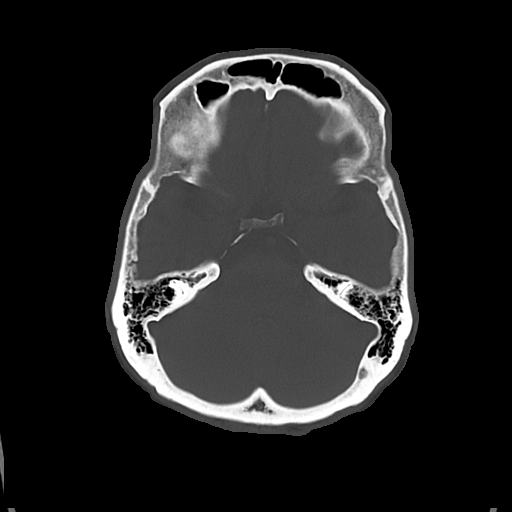
[im 22/73  bone]
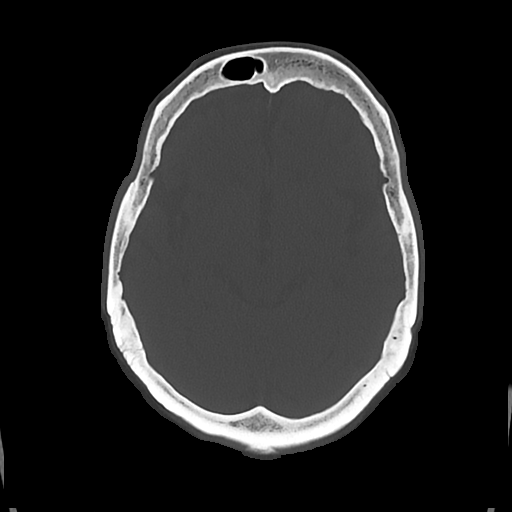

[Series 4: cor soft · coronal · 0.30mm/px · 3 of 64 slices shown]
[im 22/64  brain]
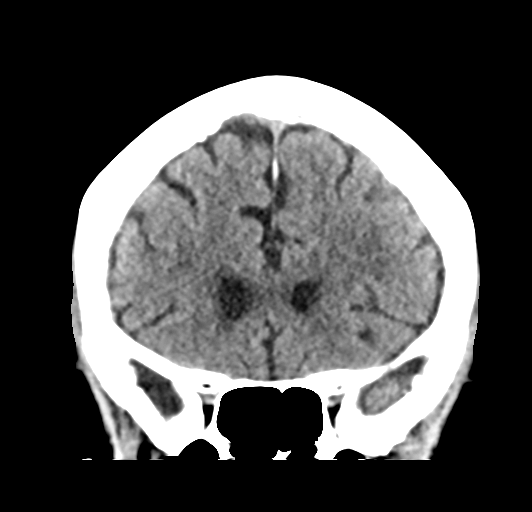
[im 29/64  brain]
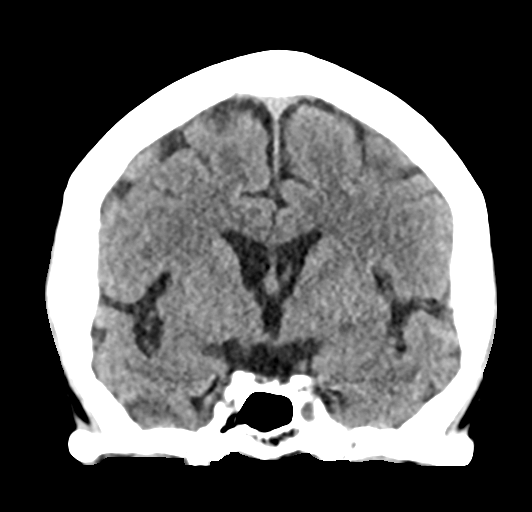
[im 36/64  brain]
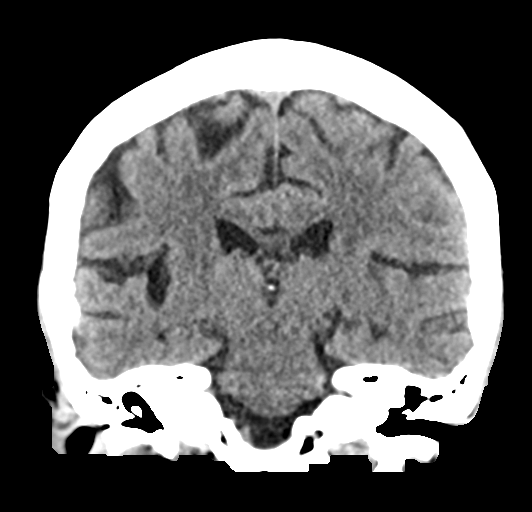

[Series 5: sag soft · sagittal · 0.29mm/px · 3 of 55 slices shown]
[im 19/55  brain]
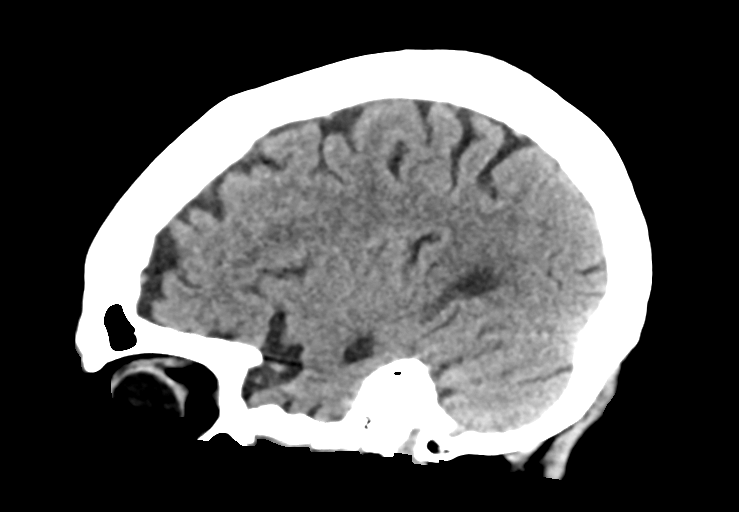
[im 28/55  brain]
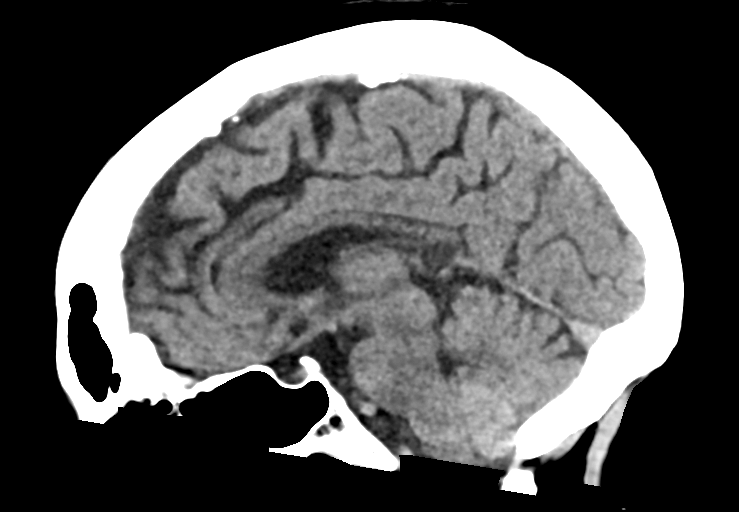
[im 37/55  brain]
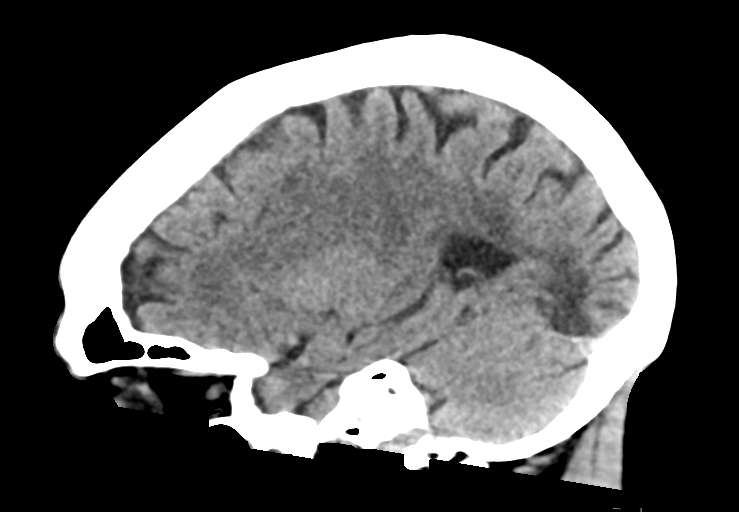

[16 of 47 positions shown; findings below may reference images not displayed]

FINDINGS: Brain: No acute intracranial findings are seen in noncontrast CT
brain. There is encephalomalacia in the posterior left occipital
cortex. Cortical sulci are prominent. There are scattered
calcifications in the basal ganglia.

Vascular: Unremarkable.

Skull: No fracture is seen.

Sinuses/Orbits: Unremarkable.

Other: None.
IMPRESSION: No acute intracranial findings are seen in noncontrast CT brain.
There is small old infarct in the left occipital cortex with no
significant change. Atrophy.

## 2021-03-25 IMAGING — DX DG CHEST 1V PORT
1 series · 1 of 1 positions shown · non-contrast
Comparison: None.

CLINICAL DATA: syncope, eval for chf

EXAM:
PORTABLE CHEST 1 VIEW

[chest ap]
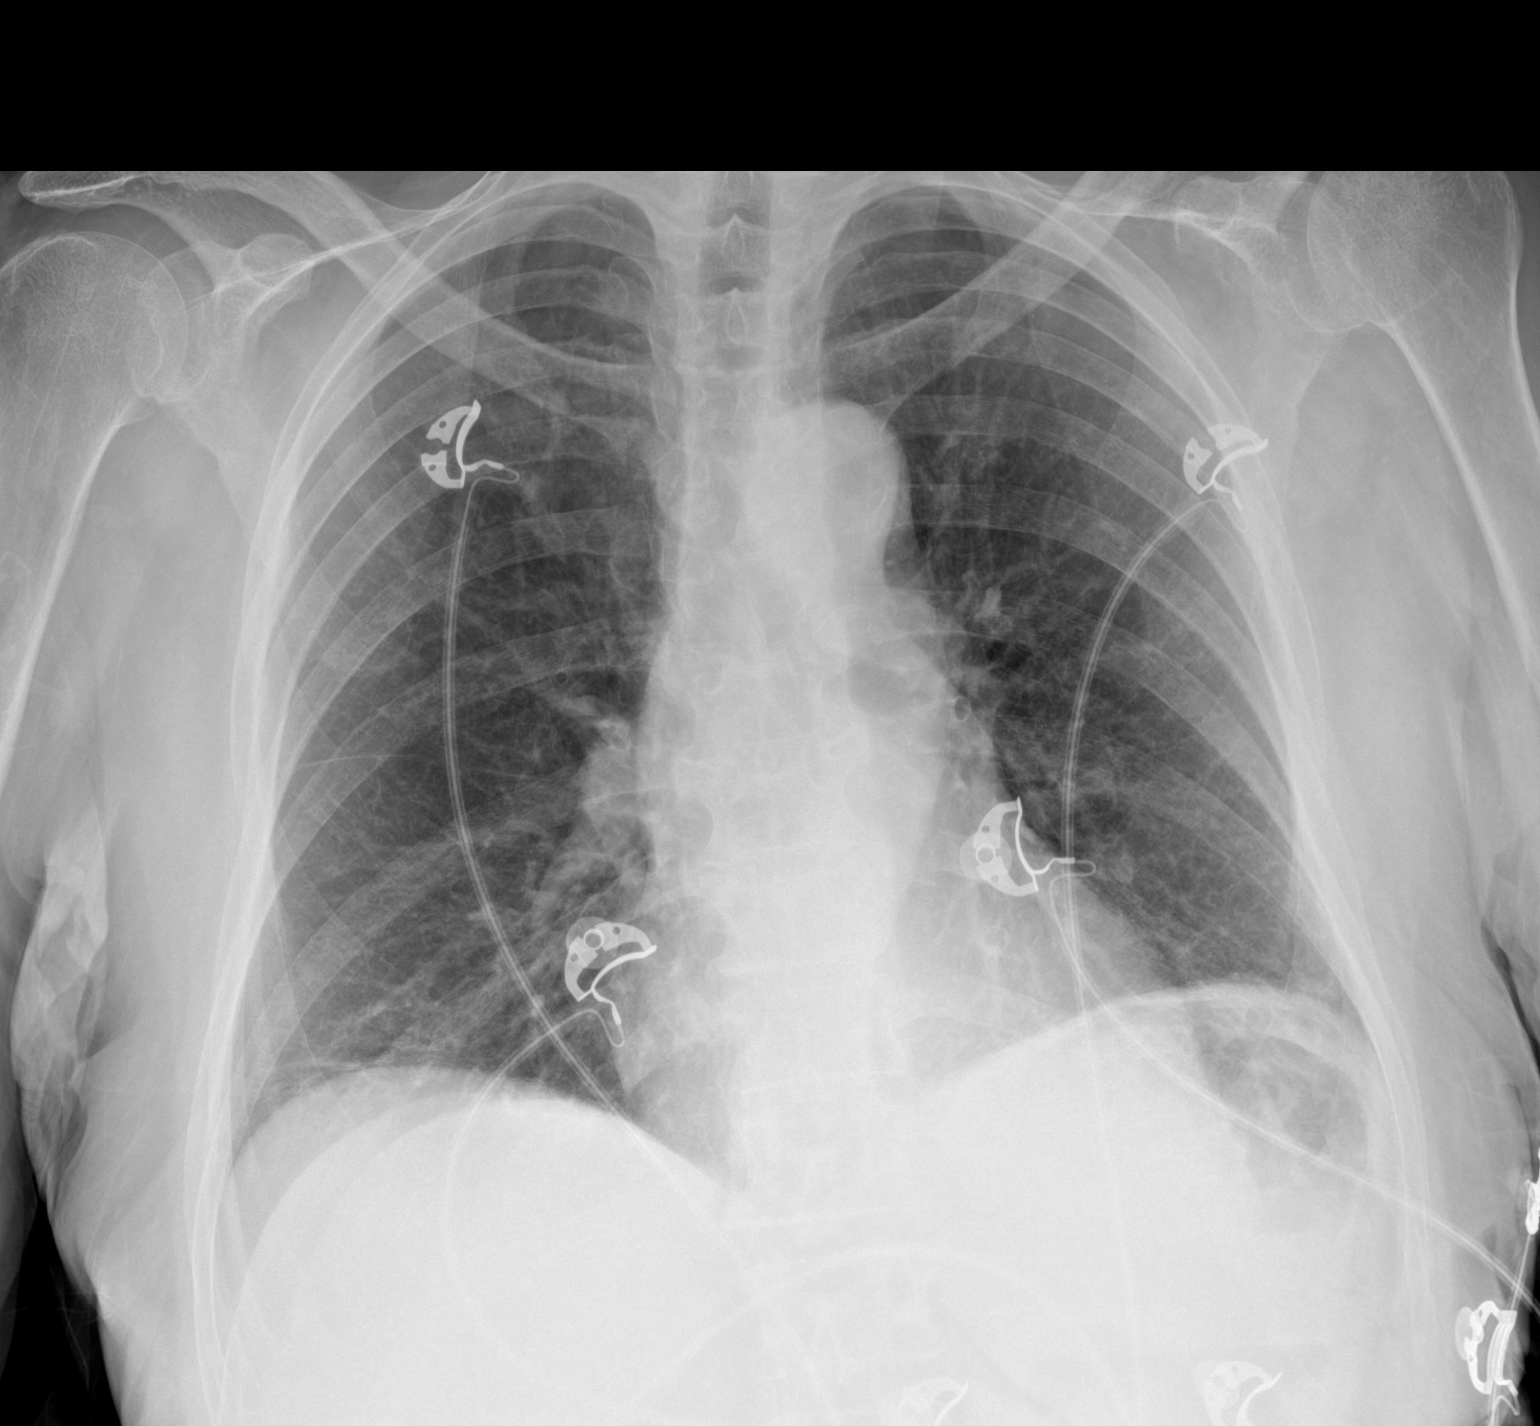

[1 of 1 positions shown; findings below may reference images not displayed]

FINDINGS: The cardiomediastinal silhouette is within normal limits. No pleural
effusion. No pneumothorax. No mass or consolidation. Minimal
subsegmental atelectasis in the right lower lung. No acute osseous
abnormality.
IMPRESSION: No acute findings in the chest. Specifically, no findings to suggest
congestive heart failure.

## 2021-03-25 MED ORDER — ATORVASTATIN CALCIUM 20 MG PO TABS
40.0000 mg | ORAL_TABLET | Freq: Every day | ORAL | Status: DC
  Administered 2021-03-25 – 2021-03-26 (×2): 40 mg via ORAL
  Filled 2021-03-25 (×2): qty 2

## 2021-03-25 MED ORDER — SODIUM CHLORIDE 0.9% FLUSH
3.0000 mL | Freq: Two times a day (BID) | INTRAVENOUS | Status: DC
  Administered 2021-03-26: 3 mL via INTRAVENOUS

## 2021-03-25 MED ORDER — SODIUM CHLORIDE 0.9% FLUSH
3.0000 mL | Freq: Two times a day (BID) | INTRAVENOUS | Status: DC
  Administered 2021-03-25 – 2021-03-26 (×3): 3 mL via INTRAVENOUS

## 2021-03-25 MED ORDER — ENOXAPARIN SODIUM 30 MG/0.3ML IJ SOSY
30.0000 mg | PREFILLED_SYRINGE | INTRAMUSCULAR | Status: DC
  Administered 2021-03-25 – 2021-03-26 (×2): 30 mg via SUBCUTANEOUS
  Filled 2021-03-25 (×2): qty 0.3

## 2021-03-25 MED ORDER — MEMANTINE HCL 5 MG PO TABS
5.0000 mg | ORAL_TABLET | Freq: Two times a day (BID) | ORAL | Status: DC
Start: 2021-03-25 — End: 2021-03-27
  Administered 2021-03-25 – 2021-03-27 (×4): 5 mg via ORAL
  Filled 2021-03-25 (×4): qty 1

## 2021-03-25 MED ORDER — SODIUM CHLORIDE 0.9% FLUSH: 3.0000 mL | INTRAVENOUS | Status: DC | PRN

## 2021-03-25 MED ORDER — SODIUM CHLORIDE 0.9 % IV SOLN: 250.0000 mL | INTRAVENOUS | Status: DC | PRN

## 2021-03-25 NOTE — Assessment & Plan Note (Signed)
?   Okay to continue follow-up outpatient with neurology, no concern for CVA at this point ?

## 2021-03-25 NOTE — Assessment & Plan Note (Addendum)
?   Differential diagnosis at this point: symptomatic bradycardia, possible sick sinus syndrome, possible orthostatic/vasovagal hypertension, side effect medication (SSRI - pt is on sertraline 25 mg and cause syncope, tachycardia, vasodilation and less than 2% of patients; trazodone can cause syncope in 3 to 5% of patients; donepezil can cause syncope in 2% of patients)  ?? We will consult cardiology: Appreciate confirmation or exclusion of modifiable cardiac diagnosis, recs re: medical management versus pacemaker placement versus other  ?? Echocardiogram and carotid ultrasound pending ?? We will hold home medications with risk of syncope: Sertraline, trazodone, donepezil.  Trazodone and donepezil seem less likely candidates since she has been on these for quite some time versus sertraline which was started relatively recently, if complains of sleep issue would probably be okay to restart trazodone ?? No apparent systemic cause based on labs ?

## 2021-03-25 NOTE — ED Notes (Signed)
Alexander, DO at bedside. ?

## 2021-03-25 NOTE — H&P (Signed)
? ? ?HISTORY AND PHYSICAL ? ?Patient: Tracy Barton 76 y.o. female ?MRN: [id number] ? ?Today is hospital day 0 after presenting to ED on 03/25/2021  4:10 PM with  ?Chief Complaint  ?Patient presents with  ? Loss of Consciousness  ? ? ? ?RECORD REVIEW AND HOSPITAL COURSE: ?To to emergency department today after concern for presyncopal episodes at home.  Found to be sinus bradycardic in the 40s, blood pressure 122/70 (ED physician reports that there was an erroneous entry of a blood pressure that was a bit lower).  No significant metabolic derangements on labs.  CT head showed old infarct in left occipital cortex and cerebral atrophy. ?Neurology note 03/15/2020: For memory loss, history of stroke, sleep difficulties.  At that visit, patient started on Zoloft, advised also continue Aricept, Namenda, trazodone. ?Cardiology note 01/17/2021: Routine follow-up, history of possible atrial fibrillation but no definitive documented arrhythmia so patient was stopped taking Eliquis 07/21/2020, but he notes history of bradycardia, stable without need for pacemaker placement as of that visit ? ?Procedures and Significant Results:  ?CT head 03/25/2021: No acute findings, old infarct left occipital cortex, atrophy ? ?Consultants:  ?Cardiology ? ? ? ?SUBJECTIVE:  ?Patient seen and examined in emergency department, resting comfortably in bed.  Daughter, Georga Kaufmann, is present at bedside.  Patient states overall she is feeling fine, is a little bit tired.  Daughter provides most of the history: Reports that patient "has been falling out more than usual" states today she had an episode of feeling lightheaded and was breathing fast today, reported feeling unsteady, daughter gave her some applesauce and some yogurt which did not really seem to help, patient was "talking out of her head and not making any sense, leading side to side" at which point daughter brought her to the emergency department.  She reports patient had an episode of  "passing out" on Monday early in the morning, which was 5 days ago.  She confirms that patient "has had these episodes of falling out for most of her life" to some degree.  Patient states she agrees with that history.  Adds that sometimes she feels like her heart is racing when she is having these episodes.  Daughter states that sometimes there are obvious triggers like standing up too quickly, sometimes this happens without any apparent trigger.  Currently, patient denies pain, palpitations, trouble breathing ? ? ? ? ?ASSESSMENT & PLAN ? ?Syncope/ presyncope - chronic, worsening  ?Differential diagnosis at this point: symptomatic bradycardia, possible sick sinus syndrome, possible orthostatic/vasovagal hypertension, side effect medication (SSRI - pt is on sertraline 25 mg and cause syncope, tachycardia, vasodilation and less than 2% of patients; trazodone can cause syncope in 3 to 5% of patients; donepezil can cause syncope in 2% of patients)  ?We will consult cardiology: Appreciate confirmation or exclusion of modifiable cardiac diagnosis, recs re: medical management versus pacemaker placement versus other  ?Echocardiogram and carotid ultrasound pending ?We will hold home medications with risk of syncope: Sertraline, trazodone, donepezil.  Trazodone and donepezil seem less likely candidates since she has been on these for quite some time versus sertraline which was started relatively recently, if complains of sleep issue would probably be okay to restart trazodone ?No apparent systemic cause based on labs ? ?Sinus bradycardia ?See above ? ?History of cardioembolic cerebrovascular accident (CVA) ?Okay to continue follow-up outpatient with neurology, no concern for CVA at this point ? ?Protein-calorie malnutrition, severe ?Patient is fairly frail, no signs at this point of significant dehydration  which would contribute to syncope ?Would consider dietary involvement to optimize nutrition ? ? ? ?VTE Ppx: lovenox ?CODE  STATUS: FULL ?Admitted from: home ?Expected Dispo: home most likely ?Barriers to discharge: Continued medical work-up ?Family communication: Daughter at bedside in emergency department ? ? ? ? ? ? ? ? ? ? ? ? ? ?Past Medical History:  ?Diagnosis Date  ? Blood transfusion without reported diagnosis   ? Depression   ? Neuromuscular disorder (HCC)   ? ? ?Past Surgical History:  ?Procedure Laterality Date  ? BRAIN SURGERY    ? LAPAROSCOPY N/A 01/10/2020  ? Procedure: LAPAROSCOPY DIAGNOSTIC laparoscopic washout omental patch splenectomy;  Surgeon: Karie Soda, MD;  Location: [facility];  Service: General;  Laterality: N/A;  ? LAPAROTOMY N/A 01/10/2020  ? Procedure: EXPLORATORY LAPAROTOMY;  Surgeon: Karie Soda, MD;  Location: [facility];  Service: General;  Laterality: N/A;  ? TUBAL LIGATION    ? ? ?Family History  ?Problem Relation Age of Onset  ? Stroke Mother   ? Cancer Mother   ? Cancer Brother   ? Mental illness Maternal Grandmother   ? ?Social History:  reports that she has been smoking. She has a 26.50 pack-year smoking history. She has never used smokeless tobacco. She reports current alcohol use of about 2.0 standard drinks per week. She reports that she does not use drugs. ? ?Allergies:  ?Allergies  ?Allergen Reactions  ? Nsaids Other (See Comments)  ?  PERFORATED ULCER  ? ? ?No current facility-administered medications on file prior to encounter.  ? ?Current Outpatient Medications on File Prior to Encounter  ?Medication Sig Dispense Refill  ? atorvastatin (LIPITOR) 40 MG tablet Take 1 tablet (40 mg total) by mouth at bedtime. 30 tablet 0  ? donepezil (ARICEPT) 10 MG tablet Take 10 mg by mouth at bedtime.    ? memantine (NAMENDA) 5 MG tablet Take 5 mg by mouth 2 (two) times daily.    ? sertraline (ZOLOFT) 25 MG tablet Take 25 mg by mouth daily.    ? traZODone (DESYREL) 50 MG tablet Take 50-100 mg by mouth at bedtime as needed for sleep.    ? ? ? ?Results for orders placed or performed during the hospital encounter of  03/25/21 (from the past 48 hour(s))  ?CBC     Status: Abnormal  ? Collection Time: 03/25/21  4:32 PM  ?Result Value Ref Range  ? WBC 6.0 4.0 - 10.5 K/uL  ? RBC 4.43 3.87 - 5.11 MIL/uL  ? Hemoglobin 11.8 (L) 12.0 - 15.0 g/dL  ? HCT 37.5 36.0 - 46.0 %  ? MCV 84.7 80.0 - 100.0 fL  ? MCH 26.6 26.0 - 34.0 pg  ? MCHC 31.5 30.0 - 36.0 g/dL  ? RDW 13.3 11.5 - 15.5 %  ? Platelets 282 150 - 400 K/uL  ? nRBC 0.0 0.0 - 0.2 %  ?  Comment: Performed at [facility], [address], [place], [place] [address]  ?Comprehensive metabolic panel     Status: Abnormal  ? Collection Time: 03/25/21  4:32 PM  ?Result Value Ref Range  ? Sodium 140 135 - 145 mmol/L  ? Potassium 3.4 (L) 3.5 - 5.1 mmol/L  ? Chloride 107 98 - 111 mmol/L  ? CO2 25 22 - 32 mmol/L  ? Glucose, Bld 159 (H) 70 - 99 mg/dL  ?  Comment: Glucose reference range applies only to samples taken after fasting for at least 8 hours.  ? BUN 16 8 -  23 mg/dL  ? Creatinine, Ser 1.28 (H) 0.44 - 1.00 mg/dL  ? Calcium 9.2 8.9 - 10.3 mg/dL  ? Total Protein 6.4 (L) 6.5 - 8.1 g/dL  ? Albumin 3.4 (L) 3.5 - 5.0 g/dL  ? AST 23 15 - 41 U/L  ? ALT 17 0 - 44 U/L  ? Alkaline Phosphatase 75 38 - 126 U/L  ? Total Bilirubin 0.7 0.3 - 1.2 mg/dL  ? GFR, Estimated 43 (L) >60 mL/min  ?  Comment: (NOTE) ?Calculated using the CKD-EPI Creatinine Equation (2021) ?  ? Anion gap 8 5 - 15  ?  Comment: Performed at [facility], [address] [place], [place] [address]  ?Troponin I (High Sensitivity)     Status: None  ? Collection Time: 03/25/21  4:32 PM  ?Result Value Ref Range  ? Troponin I (High Sensitivity) 6 <18 ng/L  ?  Comment: (NOTE) ?Elevated high sensitivity troponin I (hsTnI) values and significant  ?changes across serial measurements may suggest ACS but many other  ?chronic and acute conditions are known to elevate hsTnI results.  ?Refer to the "Links" section for chest pain algorithms and additional  ?guidance. ?Performed at [facility], [address]  [place], ?[place] [id number] ?  ? ?CT HEAD WO CONTRAST (5MM) ? ?Result Date: 03/25/2021 ?CLINICAL DATA:  Syncope EXAM: CT HEAD WITHOUT CONTRAST TECHNIQUE: Contiguous axial images were obtained from the base of the skul

## 2021-03-25 NOTE — ED Provider Notes (Signed)
? ?[facility] ?Provider Note ? ? ? Event Date/Time  ? First MD Initiated Contact with Patient 03/25/21 1616   ?  (approximate) ? ? ?History  ? ?Loss of Consciousness ? ? ?HPI ? ?Tracy Barton is a 76 y.o. female to the ER after 4 syncopal episodes that occurred today.  Nonsurgical episodes.  She denies any pain denies any chest tightness no shortness of breath no headache no numbness or tingling.  Found to be bradycardic sinus rhythm in the 40s but.  She denies any new medication.  Denies any other complaints ?  ? ? ?Physical Exam  ? ?Triage Vital Signs: ?ED Triage Vitals  ?Enc Vitals Group  ?   BP 03/25/21 1618 (!) 106/54  ?   Pulse Rate 03/25/21 1614 (!) 47  ?   Resp 03/25/21 1614 18  ?   Temp 03/25/21 1614 98 ?F (36.7 ?C)  ?   Temp Source 03/25/21 1614 Oral  ?   SpO2 03/25/21 1610 98 %  ?   Weight 03/25/21 1615 100 lb (45.4 kg)  ?   Height 03/25/21 1615 5\' 3"  (1.6 m)  ?   Head Circumference --   ?   Peak Flow --   ?   Pain Score 03/25/21 1615 0  ?   Pain Loc --   ?   Pain Edu? --   ?   Excl. in GC? --   ? ? ?Most recent vital signs: ?Vitals:  ? 03/25/21 1700 03/25/21 1730  ?BP: (!) 89/75 122/71  ?Pulse: (!) 48 (!) 47  ?Resp: 17 15  ?Temp:    ?SpO2: 95% 93%  ? ? ? ?Constitutional: Alert  ?Eyes: Conjunctivae are normal.  ?Head: Atraumatic. ?Nose: No congestion/rhinnorhea. ?Mouth/Throat: Mucous membranes are moist.   ?Neck: Painless ROM.  ?Cardiovascular:   bradycardic but Good peripheral circulation., no m/g/r ?Respiratory: Normal respiratory effort.  No retractions.  ?Gastrointestinal: Soft and nontender.  ?Musculoskeletal:  no deformity ?Neurologic:  MAE spontaneously. No gross focal neurologic deficits are appreciated.  ?Skin:  Skin is warm, dry and intact. No rash noted. ?Psychiatric: Mood and affect are normal. Speech and behavior are normal. ? ? ? ?ED Results / Procedures / Treatments  ? ?Labs ?(all labs ordered are listed, but only abnormal results are displayed) ?Labs Reviewed   ?CBC - Abnormal; Notable for the following components:  ?    Result Value  ? Hemoglobin 11.8 (*)   ? All other components within normal limits  ?COMPREHENSIVE METABOLIC PANEL - Abnormal; Notable for the following components:  ? Potassium 3.4 (*)   ? Glucose, Bld 159 (*)   ? Creatinine, Ser 1.28 (*)   ? Total Protein 6.4 (*)   ? Albumin 3.4 (*)   ? GFR, Estimated 43 (*)   ? All other components within normal limits  ?TROPONIN I (HIGH SENSITIVITY)  ? ? ? ?EKG ? ?ED ECG REPORT ?I, 04-29-1989, the attending physician, personally viewed and interpreted this ECG. ? ? Date: 03/25/2021 ? EKG Time: 16:13 ? Rate: 45 ? Rhythm:  sinus bradycardia ? Axis: normal ? Intervals: normal ? ST&T Change: no stemi, no depression ? ? ? ?RADIOLOGY ?Please see ED Course for my review and interpretation. ? ?I personally reviewed all radiographic images ordered to evaluate for the above acute complaints and reviewed radiology reports and findings.  These findings were personally discussed with the patient.  Please see medical record for radiology report. ? ? ? ?PROCEDURES: ? ?Critical  Care performed: No ? ?Procedures ? ? ?MEDICATIONS ORDERED IN ED: ?Medications - No data to display ? ? ?IMPRESSION / MDM / ASSESSMENT AND PLAN / ED COURSE  ?I reviewed the triage vital signs and the nursing notes. ?             ?               ? ?Differential diagnosis includes, but is not limited to, dysrhythmia, dehydration, electrolyte abnormality, anemia, CVA, seizure ? ?Patient presenting to the ER with witness syncopal events.  Currently protecting her airway denies any complaints.  Found to be in a sinus bradycardia with rates in the 40s well perfused.  Exam is otherwise reassuring will order blood work and observe.  Based on her age multiple syncopal events as well as bradycardia I do anticipate she can require hospitalization. ? ? ?Clinical Course as of 03/25/21 1751  ?Fri Mar 25, 2021  ?1738 Patient's troponin is normal.  She remains in a sinus  rhythm though bradycardic.  Abdominal exam is soft and benign.  Denies any headache. [PR]  ?1738 Blood work with no significant abnormality.  At this point she does appear stable and appropriate for admission the hospital. [PR]  ?1749 Case discussed with hospitalist who agrees admit patient to their service. [PR]  ?  ?Clinical Course User Index ?[PR] Willy Eddy, MD  ? ? ? ?FINAL CLINICAL IMPRESSION(S) / ED DIAGNOSES  ? ?Final diagnoses:  ?Syncope and collapse  ?Bradycardia  ? ? ? ?Rx / DC Orders  ? ?ED Discharge Orders   ? ? None  ? ?  ? ? ? ?Note:  This document was prepared using Dragon voice recognition software and may include unintentional dictation errors. ? ?  ?Willy Eddy, MD ?03/25/21 1751 ? ?

## 2021-03-25 NOTE — Plan of Care (Signed)
  Problem: Clinical Measurements: Goal: Ability to maintain clinical measurements within normal limits will improve Outcome: Progressing   

## 2021-03-25 NOTE — Assessment & Plan Note (Signed)
?   Patient is fairly frail, no signs at this point of significant dehydration which would contribute to syncope ?? Would consider dietary involvement to optimize nutrition ?

## 2021-03-25 NOTE — Assessment & Plan Note (Signed)
See above

## 2021-03-25 NOTE — ED Triage Notes (Signed)
Patient to ED via [facility] from home for multiple syncopal episodes witnessed by daughter. Patient Aox4. ?

## 2021-03-25 NOTE — ED Notes (Signed)
Patient at CT scan at this time.

## 2021-03-26 ENCOUNTER — Inpatient Hospital Stay
Admit: 2021-03-26 | Discharge: 2021-03-26 | Disposition: A | Discharge status: Home / Self Care | Payer: Medicare Other | Attending: Osteopathic Medicine | Admitting: Osteopathic Medicine

## 2021-03-26 ENCOUNTER — Inpatient Hospital Stay: Payer: Medicare Other

## 2021-03-26 ENCOUNTER — Encounter: Payer: Self-pay | Admitting: Osteopathic Medicine

## 2021-03-26 DIAGNOSIS — R55 Syncope and collapse: Secondary | ICD-10-CM

## 2021-03-26 LAB — ECHOCARDIOGRAM COMPLETE
AR max vel: 2.78 cm2
AV Peak grad: 6.4 mmHg
Ao pk vel: 1.26 m/s
Area-P 1/2: 2.4 cm2
Height: 63.5 in
S' Lateral: 2.6 cm
Weight: 1495.6 oz

## 2021-03-26 LAB — BASIC METABOLIC PANEL
Anion gap: 7 (ref 5–15)
BUN: 19 mg/dL (ref 8–23)
CO2: 26 mmol/L (ref 22–32)
Calcium: 9.2 mg/dL (ref 8.9–10.3)
Chloride: 104 mmol/L (ref 98–111)
Creatinine, Ser: 1.18 mg/dL — ABNORMAL HIGH (ref 0.44–1.00)
GFR, Estimated: 48 mL/min — ABNORMAL LOW (ref >60)
Glucose, Bld: 102 mg/dL — ABNORMAL HIGH (ref 70–99)
Potassium: 3.6 mmol/L (ref 3.5–5.1)
Sodium: 137 mmol/L (ref 135–145)

## 2021-03-26 LAB — CBC
HCT: 36.6 % (ref 36.0–46.0)
Hemoglobin: 11.8 g/dL — ABNORMAL LOW (ref 12.0–15.0)
MCH: 26.9 pg (ref 26.0–34.0)
MCHC: 32.2 g/dL (ref 30.0–36.0)
MCV: 83.4 fL (ref 80.0–100.0)
Platelets: 272 10*3/uL (ref 150–400)
RBC: 4.39 MIL/uL (ref 3.87–5.11)
RDW: 13.2 % (ref 11.5–15.5)
WBC: 9.3 10*3/uL (ref 4.0–10.5)
nRBC: 0 % (ref 0.0–0.2)

## 2021-03-26 LAB — GLUCOSE, CAPILLARY
Glucose-Capillary: 108 mg/dL — ABNORMAL HIGH (ref 70–99)
Glucose-Capillary: 95 mg/dL (ref 70–99)
Glucose-Capillary: 110 mg/dL — ABNORMAL HIGH (ref 70–99)
Glucose-Capillary: 179 mg/dL — ABNORMAL HIGH (ref 70–99)

## 2021-03-26 IMAGING — US US CAROTID DUPLEX BILAT
1 series · 13 of 24 positions shown · non-contrast
Comparison: [DATE]

CLINICAL DATA: Syncope and history smoking.

EXAM:
BILATERAL CAROTID DUPLEX ULTRASOUND
TECHNIQUE: Gray scale imaging, color Doppler and duplex ultrasound were
performed of bilateral carotid and vertebral arteries in the neck.

[Series 1: us carotid bilateral · 13 of 64 slices shown]
[im 1/64]
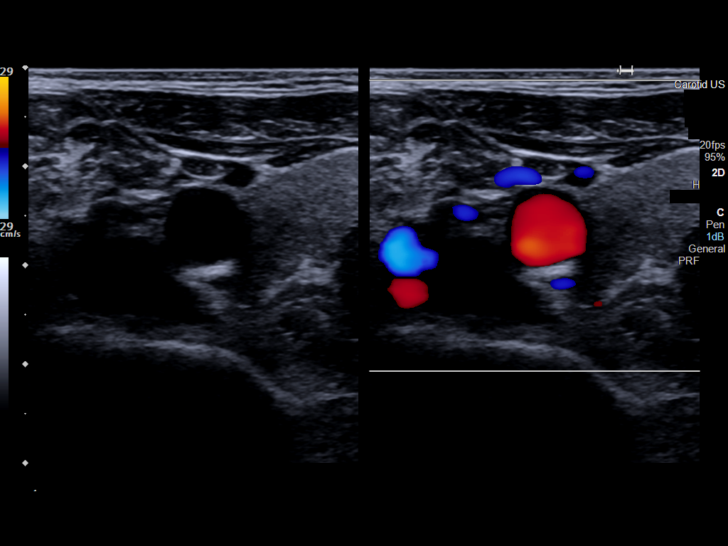
[im 6/64]
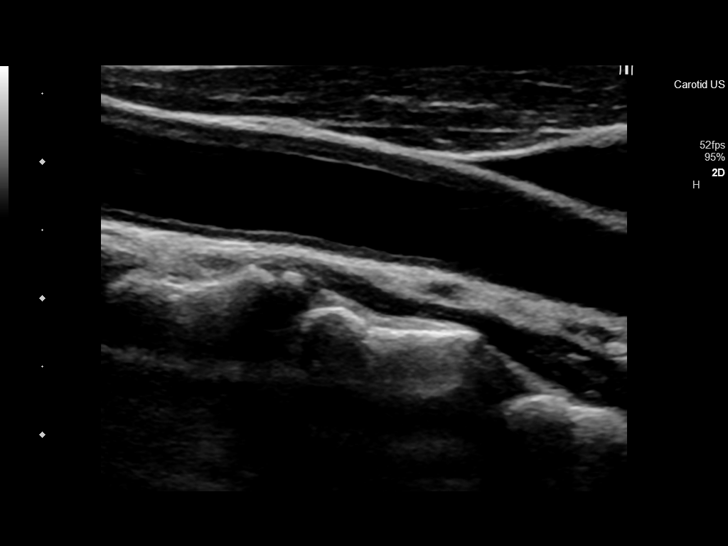
[im 11/64]
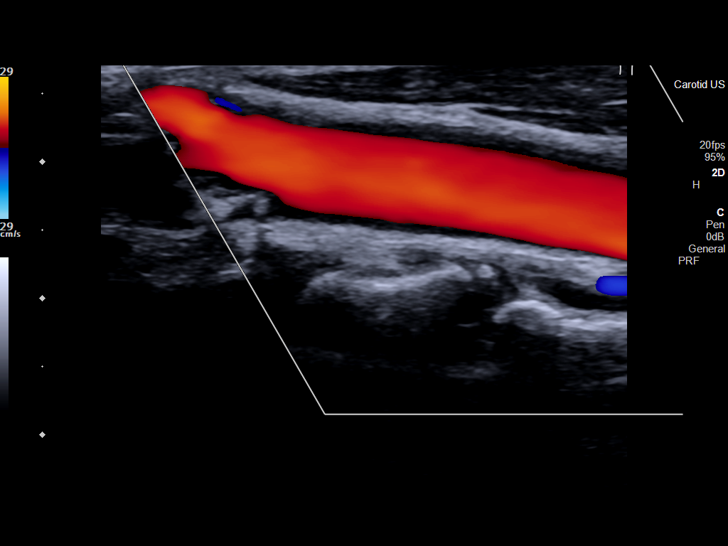
[im 17/64]
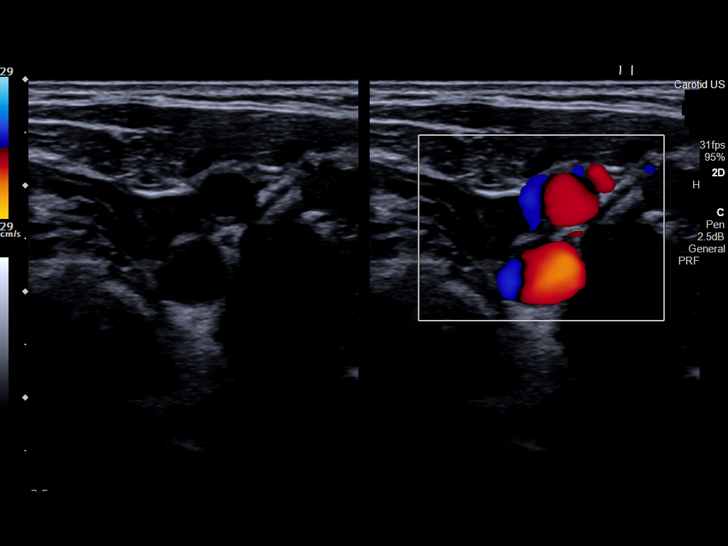
[im 22/64]
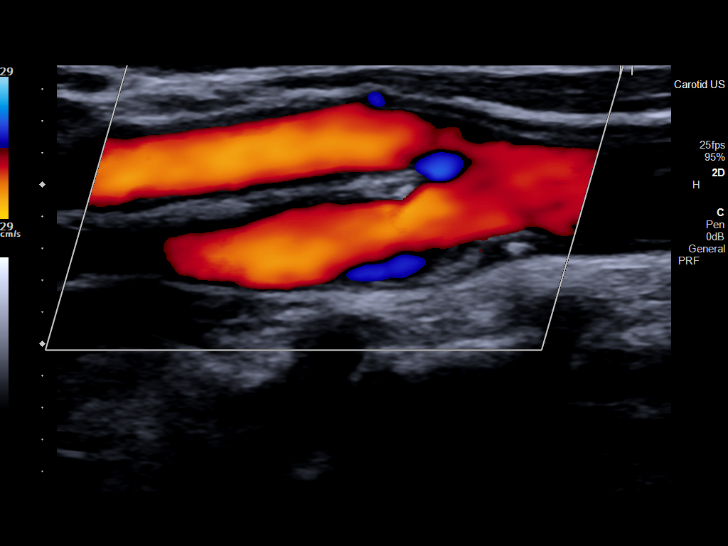
[im 28/64]
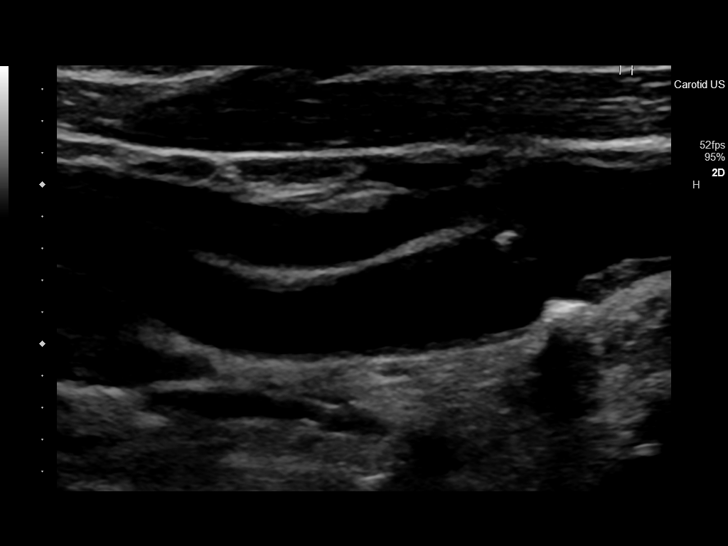
[im 33/64]
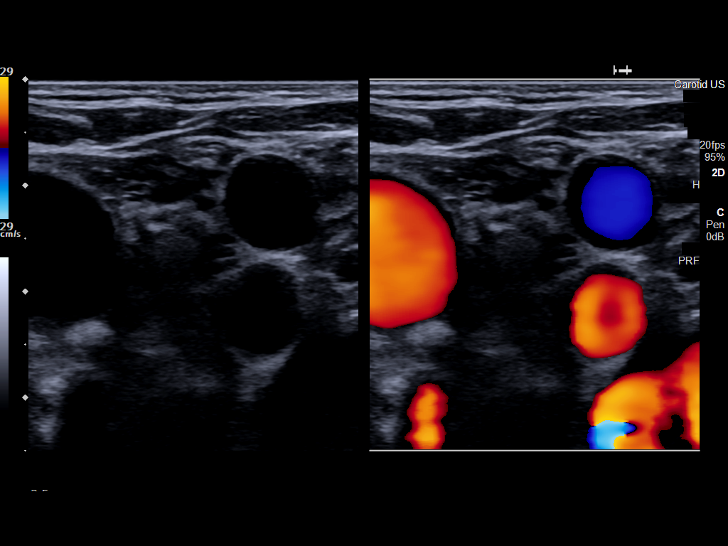
[im 36/64]
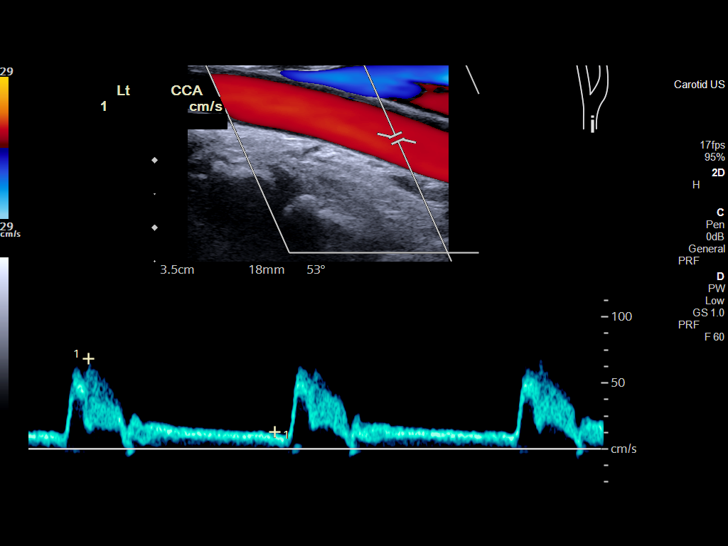
[im 42/64]
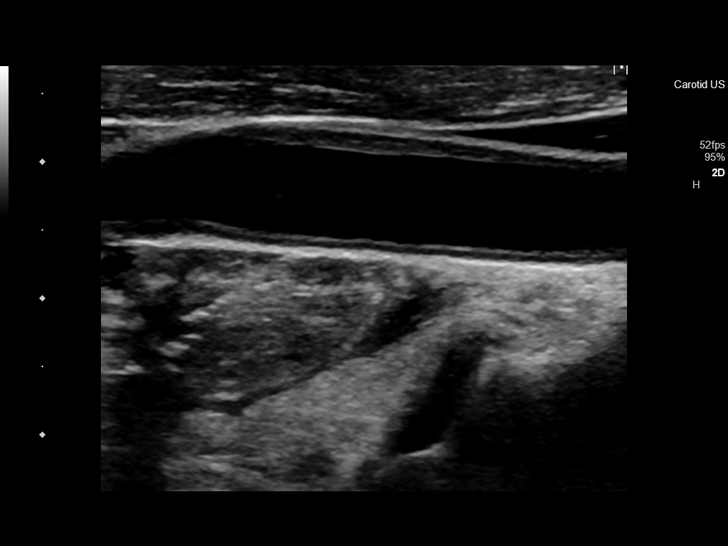
[im 47/64]
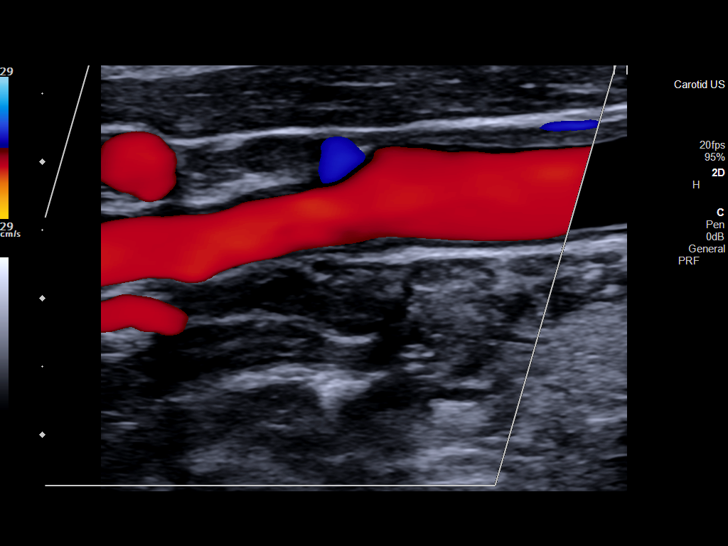
[im 53/64]
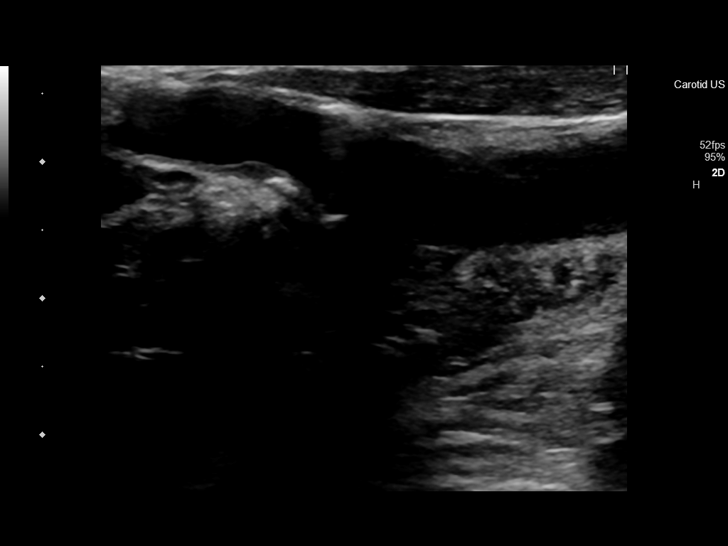
[im 58/64]
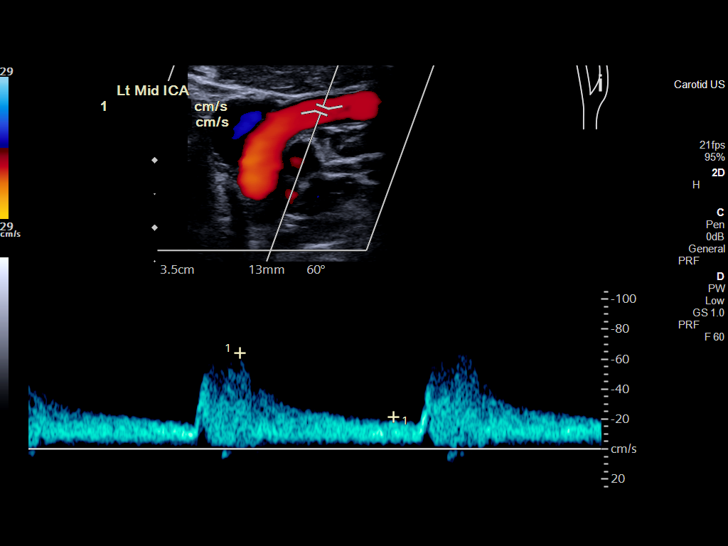
[im 64/64]
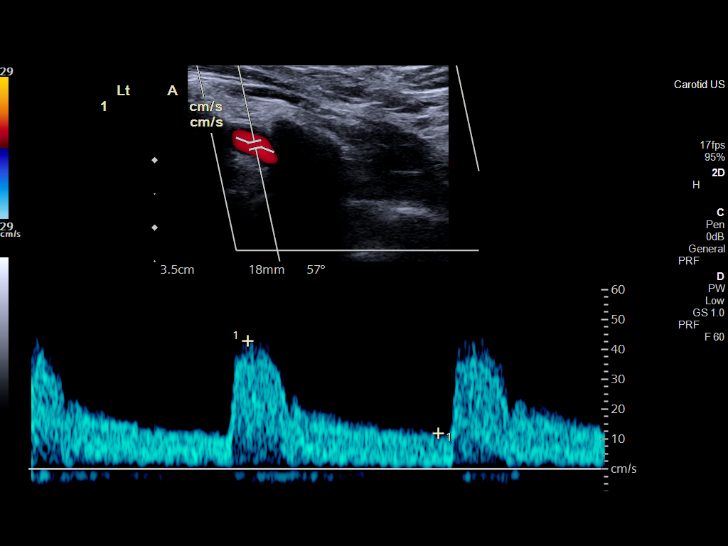

[13 of 24 positions shown; findings below may reference images not displayed]

FINDINGS: Criteria: Quantification of carotid stenosis is based on velocity
parameters that correlate the residual internal carotid diameter
with NASCET-based stenosis levels, using the diameter of the distal
internal carotid lumen as the denominator for stenosis measurement.

The following velocity measurements were obtained:

RIGHT

ICA:  62/24 cm/sec

CCA:  69/11 cm/sec

SYSTOLIC ICA/CCA RATIO:

ECA:  83 cm/sec

LEFT

ICA:  59/17 cm/sec

CCA:  56/13 cm/sec

SYSTOLIC ICA/CCA RATIO:

ECA:  78 cm/sec

RIGHT CAROTID ARTERY: Stable intimal thickening and calcified plaque
at the level of the right carotid bulb and proximal right ICA.
Estimated right ICA stenosis remains less than 50%.

RIGHT VERTEBRAL ARTERY: Antegrade flow with normal waveform and
velocity.

LEFT CAROTID ARTERY: Stable intimal thickening and partially
calcified plaque at the level of the left carotid bulb and proximal
left ICA. Estimated left ICA stenosis remains less than 50%.

LEFT VERTEBRAL ARTERY: Antegrade flow with normal waveform and
velocity.
IMPRESSION: Stable plaque at both carotid bifurcations, right greater than left,
with estimated bilateral ICA stenoses of less than 50%.

## 2021-03-26 MED ORDER — TRAZODONE HCL 50 MG PO TABS
50.0000 mg | ORAL_TABLET | Freq: Every evening | ORAL | Status: DC | PRN
  Administered 2021-03-26: 50 mg via ORAL
  Filled 2021-03-26: qty 1

## 2021-03-26 MED ORDER — ACETAMINOPHEN 500 MG PO TABS: 1000.0000 mg | ORAL_TABLET | Freq: Four times a day (QID) | ORAL | Status: DC | PRN

## 2021-03-26 MED ORDER — ACETAMINOPHEN 500 MG PO TABS
1000.0000 mg | ORAL_TABLET | Freq: Four times a day (QID) | ORAL | Status: DC | PRN
Start: 2021-03-26 — End: 2021-03-27
  Administered 2021-03-26: 1000 mg via ORAL
  Filled 2021-03-26: qty 2

## 2021-03-26 NOTE — Evaluation (Signed)
Physical Therapy Evaluation ?Patient Details ?Name: Tracy Barton ?MRN: [id number] ?DOB: 14-Aug-1945 ?Today's Date: 03/26/2021 ? ?History of Present Illness ? 76 yo Female with history of syncope episodes, presents to ED with episode of lightheadedness/breathing fast and feeling unsteady. Found to be bradycardic with HR in 40's, BP 122/70; CT of head on 03/25/21: no acute findings but shows old infarct in left occipital cortex and cerebral atrophy; Cardology consulted, agree with telemetry follow up heart rate, no clinical support for pacemaker at this point but will continue to monitor;  ?Clinical Impression ? 76 yo female presents to hospital with bradycardia. She does exhibit low pulse (high 40's) while at rest. However during PT evaluation, when she was up/walking, HR increased to 65 bpm. Patient exhibits functional strength in BLE and exhibits no numbness/tingling. She does report falling 1x last week as a result of syncope. She does report feeling slightly lightheaded/unsteady when standing. She was able to walk 175 feet with CGA to min A. She exhibits increased veering/unsteadiness at times when walking requiring min A for righting. Her walking is slightly slower at 1.6 feet/sec indicating increased risk for falls. Balance was assessed with patient exhibiting fair standing balance. She was able to stand with feet together, eyes closed with no unsteadiness noted. She took a step and held position for approximately 10 sec with mild sway; Patient was only able to hold SLS for 3-4 sec each LE. Concerned patient's HR could be slowing due to inactivity at home. She reports she doesn't do much at home now that she is living with her daughter. She would benefit from skilled PT Intervention,including home health PT to help patient establish a HEP for mobility.   Patient agreeable;  ?   ? ?Recommendations for follow up therapy are one component of a multi-disciplinary discharge planning process, led by the attending  physician.  Recommendations may be updated based on patient status, additional functional criteria and insurance authorization. ? ?Follow Up Recommendations Home health PT ? ?  ?Assistance Recommended at Discharge Intermittent Supervision/Assistance  ?Patient can return home with the following ? A little help with walking and/or transfers;A little help with bathing/dressing/bathroom;Assistance with cooking/housework;Assist for transportation ? ?  ?Equipment Recommendations None recommended by PT  ?Recommendations for Other Services ?    ?  ?Functional Status Assessment Patient has not had a recent decline in their functional status  ? ?  ?Precautions / Restrictions Precautions ?Precautions: Fall ?Restrictions ?Weight Bearing Restrictions: No  ? ?  ? ?Mobility ? Bed Mobility ?Overal bed mobility: Needs Assistance ?Bed Mobility: Supine to Sit, Sit to Supine ?  ?  ?Supine to sit: Supervision ?Sit to supine: Supervision ?  ?General bed mobility comments: required increased time/effort, utilized bed rail and elevated head of bed; ?  ? ?Transfers ?Overall transfer level: Needs assistance ?Equipment used: None ?Transfers: Sit to/from Stand ?Sit to Stand: Min guard ?  ?  ?  ?  ?  ?  ?  ? ?Ambulation/Gait ?Ambulation/Gait assistance: Min guard, Min assist ?Gait Distance (Feet): 175 Feet ?Assistive device: None ?Gait Pattern/deviations: Step-through pattern, Narrow base of support, Staggering left, Staggering right, Drifts right/left ?Gait velocity: 1.6 feet/sec (10 feet walk test) ?Gait velocity interpretation: <1.8 ft/sec, indicate of risk for recurrent falls ?  ?General Gait Details: Requires CGA for gait without AD, occasionally required min A to avoid loss of balance as patient unsteady; HR monitored during gait with HR increasing to 65 bpm; ? ?Stairs ?  ?  ?  ?  ?  ? ?  Wheelchair Mobility ?  ? ?Modified Rankin (Stroke Patients Only) ?  ? ?  ? ?Balance Overall balance assessment: Needs assistance ?Sitting-balance support:  No upper extremity supported, Feet supported ?Sitting balance-Leahy Scale: Good ?  ?  ?Standing balance support: No upper extremity supported, During functional activity ?Standing balance-Leahy Scale: Fair ?Standing balance comment: Requires CGA to min A for balance when walking. ?Single Leg Stance - Right Leg: 3 ?Single Leg Stance - Left Leg: 4 ?  ?  ?  ?Rhomberg - Eyes Closed: 10 ?  ?High Level Balance Comments: Pt able to take a step and maintain balance for at least 10 sec (staggered stance) ?  ?  ?  ?   ? ? ? ?Pertinent Vitals/Pain Pain Assessment ?Pain Assessment: 0-10 ?Pain Score: 2  ?Pain Location: left ankle ?Pain Descriptors / Indicators: Aching, Sore ?Pain Intervention(s): Limited activity within patient's tolerance, Repositioned  ? ? ?Home Living Family/patient expects to be discharged to:: Private residence ?Living Arrangements: Children ?Available Help at Discharge: Family;Available PRN/intermittently ?Type of Home: House ?Home Access: Level entry ?  ?  ?  ?Home Layout: One level ?Home Equipment: None ?   ?  ?Prior Function Prior Level of Function : Needs assist ?  ?  ?  ?Physical Assist : ADLs (physical) ?  ?ADLs (physical): Grooming;Bathing;Dressing ?Mobility Comments: reports walking without AD, limited to short distances ?ADLs Comments: daughter does the cooking and cleaning; she will supervise patient with bathing and help with dressing especially if patient is not feeling good. ?  ? ? ?Hand Dominance  ? Dominant Hand: Right ? ?  ?Extremity/Trunk Assessment  ? Upper Extremity Assessment ?Upper Extremity Assessment: Overall WFL for tasks assessed ?  ? ?Lower Extremity Assessment ?Lower Extremity Assessment: Overall WFL for tasks assessed ?  ? ?Cervical / Trunk Assessment ?Cervical / Trunk Assessment: Normal  ?Communication  ? Communication: No difficulties  ?Cognition Arousal/Alertness: Awake/alert ?Behavior During Therapy: [facility] for tasks assessed/performed ?Overall Cognitive Status: History of  cognitive impairments - at baseline ?  ?  ?  ?  ?  ?  ?  ?  ?  ?  ?  ?  ?  ?  ?  ?  ?General Comments: oriented to situation, place and person; not oriented to date; ?  ?  ? ?  ?General Comments General comments (skin integrity, edema, etc.): WFL ? ?  ?Exercises    ? ?Assessment/Plan  ?  ?PT Assessment Patient needs continued PT services  ?PT Problem List Decreased mobility;Cardiopulmonary status limiting activity;Decreased balance;Decreased activity tolerance ? ?   ?  ?PT Treatment Interventions Therapeutic exercise;Gait training;Balance training;Neuromuscular re-education;Functional mobility training;Therapeutic activities;Patient/family education   ? ?PT Goals (Current goals can be found in the Care Plan section)  ?Acute Rehab PT Goals ?Patient Stated Goal: to get better ?PT Goal Formulation: With patient ?Time For Goal Achievement: 04/09/21 ?Potential to Achieve Goals: Good ? ?  ?Frequency Min 2X/week ?  ? ? ?Co-evaluation   ?  ?  ?  ?  ? ? ?  ?AM-PAC PT "6 Clicks" Mobility  ?Outcome Measure Help needed turning from your back to your side while in a flat bed without using bedrails?: None ?Help needed moving from lying on your back to sitting on the side of a flat bed without using bedrails?: A Little ?Help needed moving to and from a bed to a chair (including a wheelchair)?: A Little ?Help needed standing up from a chair using your arms (e.g., wheelchair or bedside chair)?: A  Little ?Help needed to walk in hospital room?: A Little ?Help needed climbing 3-5 steps with a railing? : A Lot ?6 Click Score: 18 ? ?  ?End of Session Equipment Utilized During Treatment: Gait belt ?Activity Tolerance: Patient tolerated treatment well ?Patient left: in bed;with call bell/phone within reach;with bed alarm set ?Nurse Communication: Mobility status ?PT Visit Diagnosis: Unsteadiness on feet (R26.81);Other abnormalities of gait and mobility (R26.89);History of falling (Z91.81);Difficulty in walking, not elsewhere classified  (R26.2) ?  ? ?Time: 8676-7209 ?PT Time Calculation (min) (ACUTE ONLY): 22 min ? ? ?Charges:   PT Evaluation ?$PT Eval Low Complexity: 1 Low ?  ?  ?   ? ? ?Deantae Shackleton PT, DPT ?03/26/2021, 1:23 PM ? ?

## 2021-03-26 NOTE — Progress Notes (Signed)
?PROGRESS NOTE ? ? ? ?Tracy Barton  KYH:[id number] DOB: Jun 19, 1945 DOA: 03/25/2021 ?PCP: [facility]  ? ? ?Brief Narrative:  ?concern for presyncopal episodes at home.  Found to be sinus bradycardic in the 40s, blood pressure 122/70 (ED physician reports that there was an erroneous entry of a blood pressure that was a bit lower).  No significant metabolic derangements on labs.  CT head showed old infarct in left occipital cortex and cerebral atrophy. ? ?Neurology note 03/15/2020: For memory loss, history of stroke, sleep difficulties.  At that visit, patient started on Zoloft, advised also continue Aricept, Namenda, trazodone. ? ?Cardiology note 01/17/2021: Routine follow-up, history of possible atrial fibrillation but no definitive documented arrhythmia so patient was stopped taking Eliquis 07/21/2020, but he notes history of bradycardia, stable without need for pacemaker placement as of that visit ? ?Per daughter on admission patient has been more fatigued and unsteady on her feet than usual.  Seen in consultation by cardiology.  Recommend telemetry monitoring and consideration for possible pacemaker however at this time no obvious indication for pacemaker implantation. ? ? ?Assessment & Plan: ?  ?Principal Problem: ?  Syncope/ presyncope - chronic, worsening  ?Active Problems: ?  Protein-calorie malnutrition, severe ?  Sinus bradycardia ?  History of cardioembolic cerebrovascular accident (CVA) ? ?Syncope/presyncope ?Unclear etiology ?Differentials include symptomatic bradycardia, sick sinus syndrome, orthostatic hypotension ?Also considering medication affects ?Plan: ?Continue telemetry monitoring ?Follow-up TTE and carotid ultrasound ?Therapy evaluations to evaluate for chronotropic competence ?Hold home medications with risk of syncope including sertraline, trazodone, donepezil ?Cardiology follow-up ? ?Sinus bradycardia ?Plan as above ? ?History of CVA ?Outpatient  follow-up ? ?Suspected protein calorie malnutrition ?RD consultation ? ? ?DVT prophylaxis: SQ Lovenox ?Code Status: Full ?Family Communication: Daughter [phone] on 3/25 ?Disposition Plan: Status is: Inpatient ?Remains inpatient appropriate because: Symptomatic bradycardia, possible need for pacemaker ? ? ?Level of care: Med-Surg ? ?Consultants:  ?Cardiology ? ?Procedures:  ?None ? ?Antimicrobials: ?None ? ? ?Subjective: ?Patient seen and examined.  Appears somewhat weak but stable resting in bed.  No visible distress.  No pain complaints ? ?Objective: ?Vitals:  ? 03/25/21 1955 03/26/21 0026 03/26/21 7106 03/26/21 2694  ?BP: 105/61 130/64 114/66 121/63  ?Pulse: (!) 58 (!) 50 (!) 49 (!) 45  ?Resp: 18 16 16 16   ?Temp:  98.8 ?F (37.1 ?C) 98.9 ?F (37.2 ?C) 98.8 ?F (37.1 ?C)  ?TempSrc:  Oral Oral   ?SpO2: 100% 98% 98% 97%  ?Weight:      ?Height:      ? ? ?Intake/Output Summary (Last 24 hours) at 03/26/2021 1023 ?Last data filed at 03/25/2021 2200 ?Gross per 24 hour  ?Intake 100 ml  ?Output --  ?Net 100 ml  ? ?Filed Weights  ? 03/25/21 1615 03/25/21 1935  ?Weight: 45.4 kg 42.4 kg  ? ? ?Examination: ? ?General exam: NAD, appears frail ?Respiratory system: Lungs clear.  Normal work of breathing.  Room air ?Cardiovascular system: Bradycardic, regular rhythm, no murmurs, no pedal edema ?Gastrointestinal system: Thin, soft, NT/ND, normal bowel sounds ?Central nervous system: Alert and oriented. No focal neurological deficits. ?Extremities: Symmetric 5 x 5 power. ?Skin: No rashes, lesions or ulcers ?Psychiatry: Judgement and insight appear normal. Mood & affect appropriate.  ? ? ? ?Data Reviewed: I have personally reviewed following labs and imaging studies ? ?CBC: ?Recent Labs  ?Lab 03/25/21 ?1632 03/26/21 ?03/28/21  ?WBC 6.0 9.3  ?HGB 11.8* 11.8*  ?HCT 37.5 36.6  ?MCV 84.7 83.4  ?PLT 282  272  ? ?Basic Metabolic Panel: ?Recent Labs  ?Lab 03/25/21 ?1632 03/26/21 ?[id number]  ?NA 140 137  ?K 3.4* 3.6  ?CL 107 104  ?CO2 25 26  ?GLUCOSE  159* 102*  ?BUN 16 19  ?CREATININE 1.28* 1.18*  ?CALCIUM 9.2 9.2  ? ?GFR: ?Estimated Creatinine Clearance: 27.1 mL/min (A) (by C-G formula based on SCr of 1.18 mg/dL (H)). ?Liver Function Tests: ?Recent Labs  ?Lab 03/25/21 ?1632  ?AST 23  ?ALT 17  ?ALKPHOS 75  ?BILITOT 0.7  ?PROT 6.4*  ?ALBUMIN 3.4*  ? ?No results for input(s): LIPASE, AMYLASE in the last 168 hours. ?No results for input(s): AMMONIA in the last 168 hours. ?Coagulation Profile: ?No results for input(s): INR, PROTIME in the last 168 hours. ?Cardiac Enzymes: ?No results for input(s): CKTOTAL, CKMB, CKMBINDEX, TROPONINI in the last 168 hours. ?BNP (last 3 results) ?No results for input(s): PROBNP in the last 8760 hours. ?HbA1C: ?No results for input(s): HGBA1C in the last 72 hours. ?CBG: ?Recent Labs  ?Lab 03/26/21 ?0443 03/26/21 ?0807  ?GLUCAP 108* 95  ? ?Lipid Profile: ?No results for input(s): CHOL, HDL, LDLCALC, TRIG, CHOLHDL, LDLDIRECT in the last 72 hours. ?Thyroid Function Tests: ?No results for input(s): TSH, T4TOTAL, FREET4, T3FREE, THYROIDAB in the last 72 hours. ?Anemia Panel: ?No results for input(s): VITAMINB12, FOLATE, FERRITIN, TIBC, IRON, RETICCTPCT in the last 72 hours. ?Sepsis Labs: ?No results for input(s): PROCALCITON, LATICACIDVEN in the last 168 hours. ? ?No results found for this or any previous visit (from the past 240 hour(s)).  ? ? ? ? ? ?Radiology Studies: ?CT HEAD WO CONTRAST (5MM) ? ?Result Date: 03/25/2021 ?CLINICAL DATA:  Syncope EXAM: CT HEAD WITHOUT CONTRAST TECHNIQUE: Contiguous axial images were obtained from the base of the skull through the vertex without intravenous contrast. RADIATION DOSE REDUCTION: This exam was performed according to the departmental dose-optimization program which includes automated exposure control, adjustment of the mA and/or kV according to patient size and/or use of iterative reconstruction technique. COMPARISON:  05/08/2020 FINDINGS: Brain: No acute intracranial findings are seen in  noncontrast CT brain. There is encephalomalacia in the posterior left occipital cortex. Cortical sulci are prominent. There are scattered calcifications in the basal ganglia. Vascular: Unremarkable. Skull: No fracture is seen. Sinuses/Orbits: Unremarkable. Other: None. IMPRESSION: No acute intracranial findings are seen in noncontrast CT brain. There is small old infarct in the left occipital cortex with no significant change. Atrophy. Electronically Signed   By: Ernie AvenaPalani  Rathinasamy M.D.   On: 03/25/2021 16:49  ? ?US Carotid Bilateral ? ?Result Date: 03/26/2021 ?CLINICAL DATA:  Syncope and history smoking. EXAM: BILATERAL CAROTID DUPLEX ULTRASOUND TECHNIQUE: Wallace CullensGray scale imaging, color Doppler and duplex ultrasound were performed of bilateral carotid and vertebral arteries in the neck. COMPARISON:  05/08/2020 FINDINGS: Criteria: Quantification of carotid stenosis is based on velocity parameters that correlate the residual internal carotid diameter with NASCET-based stenosis levels, using the diameter of the distal internal carotid lumen as the denominator for stenosis measurement. The following velocity measurements were obtained: RIGHT ICA:  62/24 cm/sec CCA:  69/11 cm/sec SYSTOLIC ICA/CCA RATIO:  0.9 ECA:  83 cm/sec LEFT ICA:  59/17 cm/sec CCA:  56/13 cm/sec SYSTOLIC ICA/CCA RATIO:  1.1 ECA:  78 cm/sec RIGHT CAROTID ARTERY: Stable intimal thickening and calcified plaque at the level of the right carotid bulb and proximal right ICA. Estimated right ICA stenosis remains less than 50%. RIGHT VERTEBRAL ARTERY: Antegrade flow with normal waveform and velocity. LEFT CAROTID ARTERY: Stable intimal thickening and partially calcified  plaque at the level of the left carotid bulb and proximal left ICA. Estimated left ICA stenosis remains less than 50%. LEFT VERTEBRAL ARTERY: Antegrade flow with normal waveform and velocity. IMPRESSION: Stable plaque at both carotid bifurcations, right greater than left, with estimated bilateral  ICA stenoses of less than 50%. Electronically Signed   By: Irish Lack M.D.   On: 03/26/2021 10:16  ? ?DG Chest Portable 1 View ? ?Result Date: 03/25/2021 ?CLINICAL DATA:  syncope, eval for chf EXAM: PORTABLE CHEST 1 VIEW

## 2021-03-26 NOTE — Progress Notes (Signed)
*  PRELIMINARY RESULTS* ?Echocardiogram ?2D Echocardiogram has been performed. ? ?Tracy Barton ?03/26/2021, 12:32 PM ?

## 2021-03-26 NOTE — Evaluation (Signed)
Occupational Therapy Evaluation ?Patient Details ?Name: Tracy Barton ?MRN: [id number] ?DOB: 1945/11/16 ?Today's Date: 03/26/2021 ? ? ?History of Present Illness 76 yo Female with history of syncope episodes, presents to ED with episode of lightheadedness/breathing fast and feeling unsteady. Found to be bradycardic with HR in 40's, BP 122/70; CT of head on 03/25/21: no acute findings but shows old infarct in left occipital cortex and cerebral atrophy; Cardology consulted, agree with telemetry follow up heart rate, no clinical support for pacemaker at this point but will continue to monitor;  ? ?Clinical Impression ?  ?Pt was seen for OT evaluation this date. Prior to hospital admission, pt was independent with mobility and basic ADL, daughter assisting with IADL tasks. Pt endorses falls but unable to describe how many in past 2mo or how they occur. Pt lives with her daughter. Currently pt demonstrates mild impairments in balance and safety, noting mild lightheadedness while in standing for grooming tasks with noted HR drop from 70 to 53. HR 49 at rest, ranging from mid 50's to 70 with exertion. These impairments functionally limit her safety with mobility and ADL tasks. Pt would benefit from skilled OT services to address noted impairments and functional limitations (see below for any additional details) in order to maximize safety and independence while minimizing falls risk and caregiver burden. Upon hospital discharge, recommend HHOT to maximize pt safety and return to functional independence during meaningful occupations of daily life.    ? ?Recommendations for follow up therapy are one component of a multi-disciplinary discharge planning process, led by the attending physician.  Recommendations may be updated based on patient status, additional functional criteria and insurance authorization.  ? ?Follow Up Recommendations ? Home health OT  ?  ?Assistance Recommended at Discharge Intermittent  Supervision/Assistance  ?Patient can return home with the following A little help with walking and/or transfers;A little help with bathing/dressing/bathroom;Assistance with cooking/housework;Assist for transportation;Direct supervision/assist for medications management;Help with stairs or ramp for entrance ? ?  ?Functional Status Assessment ? Patient has had a recent decline in their functional status and demonstrates the ability to make significant improvements in function in a reasonable and predictable amount of time.  ?Equipment Recommendations ? BSC/3in1  ?  ?Recommendations for Other Services   ? ? ?  ?Precautions / Restrictions Precautions ?Precautions: Fall ?Restrictions ?Weight Bearing Restrictions: No  ? ?  ? ?Mobility Bed Mobility ?Overal bed mobility: Needs Assistance ?Bed Mobility: Supine to Sit, Sit to Supine ?  ?  ?Supine to sit: Supervision ?Sit to supine: Supervision ?  ?General bed mobility comments: increased time/effort ?  ? ?Transfers ?Overall transfer level: Needs assistance ?Equipment used: None ?Transfers: Sit to/from Stand ?Sit to Stand: Min guard ?  ?  ?  ?  ?  ?  ?  ? ?  ?Balance Overall balance assessment: Needs assistance ?Sitting-balance support: No upper extremity supported, Feet supported ?Sitting balance-Leahy Scale: Good ?  ?  ?Standing balance support: No upper extremity supported, During functional activity ?Standing balance-Leahy Scale: Fair ?Standing balance comment: SBA-CGA to stand at the sink for grooming tasks ?  ?  ?  ?  ?  ?  ?  ?  ?  ?  ?  ?   ? ?ADL either performed or assessed with clinical judgement  ? ?ADL Overall ADL's : Needs assistance/impaired ?  ?  ?  ?  ?  ?  ?  ?  ?  ?  ?  ?  ?  ?  ?  ?  ?  ?  ?  ?  General ADL Comments: Pt completes LB dressing with CGA only for standing, SBA-CGA in standing for grooming tasks at the sink, supv for seated UB dressing.  ? ? ? ?Vision   ?   ?   ?Perception   ?  ?Praxis   ?  ? ?Pertinent Vitals/Pain Pain Assessment ?Pain  Assessment: No/denies pain  ? ? ? ?Hand Dominance Right ?  ?Extremity/Trunk Assessment Upper Extremity Assessment ?Upper Extremity Assessment: Overall WFL for tasks assessed [facility] for age) ?  ?Lower Extremity Assessment ?Lower Extremity Assessment: Overall WFL for tasks assessed [facility] for age) ?  ?Cervical / Trunk Assessment ?Cervical / Trunk Assessment: Normal ?  ?Communication Communication ?Communication: No difficulties ?  ?Cognition Arousal/Alertness: Awake/alert ?Behavior During Therapy: [facility] for tasks assessed/performed ?Overall Cognitive Status: History of cognitive impairments - at baseline ?  ?  ?  ?  ?  ?  ?  ?  ?  ?  ?  ?  ?  ?  ?  ?  ?General Comments: not oriented to date, follows commands well ?  ?  ?General Comments  HR 49 at rest, up to mid 50's-70 with exertion, noted some mild lightheadedness when HR went from 70 to 53 while standing the sink. ? ?  ?Exercises Other Exercises ?Other Exercises: Pt educated in falls prevention ?  ?Shoulder Instructions    ? ? ?Home Living Family/patient expects to be discharged to:: Private residence ?Living Arrangements: Children ?Available Help at Discharge: Family;Available PRN/intermittently ?Type of Home: House ?Home Access: Level entry ?  ?  ?Home Layout: One level ?  ?  ?Bathroom Shower/Tub: Tub/shower unit ?  ?Bathroom Toilet: Standard ?  ?  ?Home Equipment: None ?  ?  ?  ? ?  ?Prior Functioning/Environment Prior Level of Function : Needs assist;History of Falls (last six months) ?  ?  ?  ?Physical Assist : ADLs (physical) ?  ?ADLs (physical): Grooming;Bathing;Dressing ?Mobility Comments: reports walking without AD, limited to short distances ?ADLs Comments: daughter does the cooking and cleaning; she will supervise patient with bathing and help with dressing especially if patient is not feeling good. Dtr assists with medication mgt and transportation as well. ?  ? ?  ?  ?OT Problem List: Impaired balance (sitting and/or standing);Cardiopulmonary status limiting  activity;Decreased knowledge of use of DME or AE ?  ?   ?OT Treatment/Interventions: Self-care/ADL training;Therapeutic activities;DME and/or AE instruction;Patient/family education;Balance training  ?  ?OT Goals(Current goals can be found in the care plan section) Acute Rehab OT Goals ?Patient Stated Goal: go home ?OT Goal Formulation: With patient ?Time For Goal Achievement: 04/09/21 ?Potential to Achieve Goals: Good ?ADL Goals ?Pt Will Transfer to Toilet: with modified independence;ambulating (LRAD, elevated commode) ?Additional ADL Goal #1: Pt will perform UB and LB bathing with remote supervision for safety, sitting PRN. ?Additional ADL Goal #2: Pt will verbalize plan to implement at least 1 learned falls prevention strategy to maximize safety.  ?OT Frequency: Min 2X/week ?  ? ?Co-evaluation   ?  ?  ?  ?  ? ?  ?AM-PAC OT "6 Clicks" Daily Activity     ?Outcome Measure Help from another person eating meals?: None ?Help from another person taking care of personal grooming?: A Little ?Help from another person toileting, which includes using toliet, bedpan, or urinal?: A Little ?Help from another person bathing (including washing, rinsing, drying)?: A Little ?Help from another person to put on and taking off regular upper body clothing?: None ?Help from another person to  put on and taking off regular lower body clothing?: A Little ?6 Click Score: 20 ?  ?End of Session Equipment Utilized During Treatment: Gait belt ? ?Activity Tolerance: Patient tolerated treatment well ?Patient left: in bed;with call bell/phone within reach;with bed alarm set ? ?OT Visit Diagnosis: Other abnormalities of gait and mobility (R26.89);Repeated falls (R29.6)  ?              ?Time: [id number] ?OT Time Calculation (min): 15 min ?Charges:  OT General Charges ?$OT Visit: 1 Visit ?OT Evaluation ?$OT Eval Low Complexity: 1 Low ? ?Ardeth Perfect., MPH, MS, OTR/L ?ascom [phone] ?03/26/21, 3:13 PM ?

## 2021-03-26 NOTE — Consult Note (Signed)
?CARDIOLOGY CONSULT NOTE  ? ?   ?    ?  ? ? ? ?Patient ID: ?Tracy Barton ?MRN: [id number] ?DOB/AGE: 02/26/1945 76 y.o. ? ?Admit date: 03/25/2021 ?Referring Physician Dr. Sunnie Nielsen hospitalist ?Primary Physician Hillery Aldo [facility] ?Primary Cardiologist Dennisse Swader ?Reason for Consultation syncope bradycardia ? ?HPI: Patient is a 76 year old female with history of mild dementia bradycardia recurrent near syncope or syncopal episodes with recent CVA patient is being evaluated for possible atrial fibrillation but no specific evidence have been identified the patient has not been on anticoagulation patient also been bradycardic but no clear compelling indication for permanent pacemaker has been identified as well the patient is being treated conservatively medically.  Patient had an episode which brought her to the emergency room of feeling lightheaded dizzy and reportedly may have lost consciousness for a few seconds and was brought to emergency room heart rate was found to be around 50 most recent previous episode was probably a month ago she is on dementia medications as well as depression and insomnia medicines denies any chest pain denies any palpitation tachycardia no injuries with presumed syncopal episodes now here for further cardiac assessment ? ?Review of systems complete and found to be negative unless listed above  ? ? ? ?Past Medical History:  ?Diagnosis Date  ? Blood transfusion without reported diagnosis   ? Depression   ? Neuromuscular disorder (HCC)   ?  ?Past Surgical History:  ?Procedure Laterality Date  ? BRAIN SURGERY    ? LAPAROSCOPY N/A 01/10/2020  ? Procedure: LAPAROSCOPY DIAGNOSTIC laparoscopic washout omental patch splenectomy;  Surgeon: Karie Soda, MD;  Location: [facility];  Service: General;  Laterality: N/A;  ? LAPAROTOMY N/A 01/10/2020  ? Procedure: EXPLORATORY LAPAROTOMY;  Surgeon: Karie Soda, MD;  Location: [facility];  Service: General;  Laterality: N/A;  ? TUBAL LIGATION     ?  ?Medications Prior to Admission  ?Medication Sig Dispense Refill Last Dose  ? atorvastatin (LIPITOR) 40 MG tablet Take 1 tablet (40 mg total) by mouth at bedtime. 30 tablet 0 03/24/2021 at 1900  ? donepezil (ARICEPT) 10 MG tablet Take 10 mg by mouth at bedtime.   03/24/2021 at 1900  ? memantine (NAMENDA) 5 MG tablet Take 5 mg by mouth 2 (two) times daily.   03/24/2021 at 1900  ? sertraline (ZOLOFT) 25 MG tablet Take 25 mg by mouth daily.   03/24/2021 at Unknown  ? traZODone (DESYREL) 50 MG tablet Take 50-100 mg by mouth at bedtime as needed for sleep.   Unknown at PRN  ? ?Social History  ? ?Socioeconomic History  ? Marital status: Divorced  ?  Spouse name: Not on file  ? Number of children: Not on file  ? Years of education: Not on file  ? Highest education level: Not on file  ?Occupational History  ? Not on file  ?Tobacco Use  ? Smoking status: Some Days  ?  Packs/day: 0.50  ?  Years: 53.00  ?  Pack years: 26.50  ?  Types: Cigarettes  ? Smokeless tobacco: Never  ?Vaping Use  ? Vaping Use: Never used  ?Substance and Sexual Activity  ? Alcohol use: Yes  ?  Alcohol/week: 2.0 standard drinks  ?  Types: 2 Cans of beer per week  ? Drug use: No  ? Sexual activity: Not on file  ?Other Topics Concern  ? Not on file  ?Social History Narrative  ? Not on file  ? ?Social Determinants of Health  ? ?  Financial Resource Strain: Not on file  ?Food Insecurity: Not on file  ?Transportation Needs: Not on file  ?Physical Activity: Not on file  ?Stress: Not on file  ?Social Connections: Not on file  ?Intimate Partner Violence: Not on file  ?  ?Family History  ?Problem Relation Age of Onset  ? Stroke Mother   ? Cancer Mother   ? Cancer Brother   ? Mental illness Maternal Grandmother   ?  ? ? ?Review of systems complete and found to be negative unless listed above  ? ? ? ? ?PHYSICAL EXAM ? ?General: Well developed, well nourished, in no acute distress ?HEENT:  Normocephalic and atramatic ?Neck:  No JVD.  ?Lungs: Clear bilaterally to  auscultation and percussion. ?Heart: HRRR . Normal S1 and S2 without gallops or murmurs.  ?Abdomen: Bowel sounds are positive, abdomen soft and non-tender  ?Msk:  Back normal, normal gait. Normal strength and tone for age. ?Extremities: No clubbing, cyanosis or edema.   ?Neuro: Alert and oriented X 3. ?Psych:  Good affect, responds appropriately ? ?Labs: ?  ?Lab Results  ?Component Value Date  ? WBC 9.3 03/26/2021  ? HGB 11.8 (L) 03/26/2021  ? HCT 36.6 03/26/2021  ? MCV 83.4 03/26/2021  ? PLT 272 03/26/2021  ?  ?Recent Labs  ?Lab 03/25/21 ?1632 03/26/21 ?7591  ?NA 140 137  ?K 3.4* 3.6  ?CL 107 104  ?CO2 25 26  ?BUN 16 19  ?CREATININE 1.28* 1.18*  ?CALCIUM 9.2 9.2  ?PROT 6.4*  --   ?BILITOT 0.7  --   ?ALKPHOS 75  --   ?ALT 17  --   ?AST 23  --   ?GLUCOSE 159* 102*  ? ?No results found for: CKTOTAL, CKMB, CKMBINDEX, TROPONINI  ?Lab Results  ?Component Value Date  ? CHOL 234 (H) 05/09/2020  ? ?Lab Results  ?Component Value Date  ? HDL 54 05/09/2020  ? ?Lab Results  ?Component Value Date  ? LDLCALC 138 (H) 05/09/2020  ? ?Lab Results  ?Component Value Date  ? TRIG 208 (H) 05/09/2020  ? TRIG 125 01/15/2020  ? ?Lab Results  ?Component Value Date  ? CHOLHDL 4.3 05/09/2020  ? ?No results found for: LDLDIRECT  ?  ?Radiology: CT HEAD WO CONTRAST ( ) ? ?Result Date: 03/25/2021 ?CLINICAL DATA:  Syncope EXAM: CT HEAD WITHOUT CONTRAST TECHNIQUE: Contiguous axial images were obtained from the base of the skull through the vertex without intravenous contrast. RADIATION DOSE REDUCTION: This exam was performed according to the departmental dose-optimization program which includes automated exposure control, adjustment of the mA and/or kV according to patient size and/or use of iterative reconstruction technique. COMPARISON:  05/08/2020 FINDINGS: Brain: No acute intracranial findings are seen in noncontrast CT brain. There is encephalomalacia in the posterior left occipital cortex. Cortical sulci are prominent. There are scattered  calcifications in the basal ganglia. Vascular: Unremarkable. Skull: No fracture is seen. Sinuses/Orbits: Unremarkable. Other: None. IMPRESSION: No acute intracranial findings are seen in noncontrast CT brain. There is small old infarct in the left occipital cortex with no significant change. Atrophy. Electronically Signed   By: Ernie Avena M.D.   On: 03/25/2021 16:49  ? ?DG Chest Portable 1 View ? ?Result Date: 03/25/2021 ?CLINICAL DATA:  syncope, eval for chf EXAM: PORTABLE CHEST 1 VIEW COMPARISON:  None. FINDINGS: The cardiomediastinal silhouette is within normal limits. No pleural effusion. No pneumothorax. No mass or consolidation. Minimal subsegmental atelectasis in the right lower lung. No acute osseous abnormality. IMPRESSION: No acute findings  in the chest. Specifically, no findings to suggest congestive heart failure. Electronically Signed   By: Olive BassYasser  El-Abd M.D.   On: 03/25/2021 16:48   ? ?EKG: Sinus bradycardia rhythm bradycardia nonspecific ST-T changes rate of 50 ? ?ASSESSMENT AND PLAN:  ?Bradycardia ?Near syncope ?Hyperlipidemia ?History of CVA ?Depression ?Neuromuscular disorder ?Marland Kitchen. ?Plan ?Agree with telemetry follow-up heart rate ?No clinically support from pacemaker at this point ?Avoid AV nodal blocking drugs ?Continue statin therapy for hyperlipidemia ?Continue functional study for evaluation of chronotropic competence ?Consider event monitor for evaluation of high-grade block or pauses ?If symptoms persist will consider permanent pacemaker for presumed symptomatic bradycardia ?Signed: ?Alwyn Peawayne D Lavern Maslow MD ?03/26/2021, 8:17 AM ? ? ? ? ?  ?

## 2021-03-27 NOTE — Discharge Summary (Signed)
Physician Discharge Summary  ?ELISAVET BUEHRER TDD:[id number] DOB: [birth year] DOA: 03/25/2021 ? ?PCP: [facility] ? ?Admit date: 03/25/2021 ?Discharge date: 03/27/2021 ? ?Admitted From: Home ?Disposition:  Home with home health ? ?Recommendations for Outpatient Follow-up:  ?Follow up with PCP in 1-2 weeks ?Follow-up outpatient cardiology Dr. Juliann Pares ? ?Home Health: Yes PT OT ?Equipment/Devices: None ? ?Discharge Condition: Stable ?CODE STATUS: Full  ?Diet recommendation: Regular ? ?Brief/Interim Summary: ?concern for presyncopal episodes at home.  Found to be sinus bradycardic in the 40s, blood pressure 122/70 (ED physician reports that there was an erroneous entry of a blood pressure that was a bit lower).  No significant metabolic derangements on labs.  CT head showed old infarct in left occipital cortex and cerebral atrophy. ?  ?Neurology note 03/15/2020: For memory loss, history of stroke, sleep difficulties.  At that visit, patient started on Zoloft, advised also continue Aricept, Namenda, trazodone. ?  ?Cardiology note 01/17/2021: Routine follow-up, history of possible atrial fibrillation but no definitive documented arrhythmia so patient was stopped taking Eliquis 07/21/2020, but he notes history of bradycardia, stable without need for pacemaker placement as of that visit ?  ?Per daughter on admission patient has been more fatigued and unsteady on her feet than usual.  Seen in consultation by cardiology.  Recommend telemetry monitoring and consideration for possible pacemaker however at this time no obvious indication for pacemaker implantation. ? ?Transthoracic echocardiogram reviewed.  Normal ejection fraction.  Normal right ventricular systolic function.  Carotid duplex reassuring.  Stable plaques, less than 50% of stenosis.  No indication for intervention.  Seen on day of discharge by cardiology.  No immediate indication for pacemaker implant.  Stable for discharge from cardiology  standpoint.  Patient will follow-up in cardiology office postdischarge for consideration of an event monitor. ? ? ? ?Discharge Diagnoses:  ?Principal Problem: ?  Syncope/ presyncope - chronic, worsening  ?Active Problems: ?  Protein-calorie malnutrition, severe ?  Sinus bradycardia ?  History of cardioembolic cerebrovascular accident (CVA) ? ?Syncope/presyncope ?Unclear etiology ?Differentials include symptomatic bradycardia, sick sinus syndrome, orthostatic hypotension ?Also considering medication affects ?TTE reassuring, normal EF with normal RV function ?Carotid duplex stable, stable plaques, less than 50% stenosis ?Plan: ?Discharge home.  Home health services ordered.  Discussed with daughter.  Per daughter will follow-up with primary care regarding cumulative effects of dementia medications and mood stabilizers.  No changes made to her medication regimen at time of discharge.  Patient will follow up with PCP and cardiology postdischarge.  Cardiology to consider outpatient event monitor at clinic visit ? ?Sinus bradycardia ?Plan as above ?  ?History of CVA ?Outpatient follow-up ?  ?Discharge Instructions ? ?Discharge Instructions   ? ? Diet - low sodium heart healthy   Complete by: As directed ?  ? Increase activity slowly   Complete by: As directed ?  ? ?  ? ?Allergies as of 03/27/2021   ? ?   Reactions  ? Nsaids Other (See Comments)  ? PERFORATED ULCER  ? ?  ? ?  ?Medication List  ?  ? ?TAKE these medications   ? ?atorvastatin 40 MG tablet ?Commonly known as: Lipitor ?Take 1 tablet (40 mg total) by mouth at bedtime. ?  ?donepezil 10 MG tablet ?Commonly known as: ARICEPT ?Take 10 mg by mouth at bedtime. ?  ?memantine 5 MG tablet ?Commonly known as: NAMENDA ?Take 5 mg by mouth 2 (two) times daily. ?  ?sertraline 25 MG tablet ?Commonly known as: ZOLOFT ?Take 25  mg by mouth daily. ?  ?traZODone 50 MG tablet ?Commonly known as: DESYREL ?Take 50-100 mg by mouth at bedtime as needed for sleep. ?  ? ?  ? ? Follow-up  Information   ? ? [facility], [facility]. Schedule an appointment as soon as possible for a visit in 1 week(s).   ?Specialty: General Practice ?Contact information: ?[address] ?[place] [address] ?[phone] ? ? ?  ?  ? ? Callwood, Dwayne D, MD. Schedule an appointment as soon as possible for a visit in 1 week(s).   ?Specialties: Cardiology, Internal Medicine ?Why: Call Dr. Glennis Brink office to confirm followup appointment ?Contact information: ?[address] ?[place] [address] ?[phone] ? ? ?  ?  ? ?  ?  ? ?  ? ?Allergies  ?Allergen Reactions  ? Nsaids Other (See Comments)  ?  PERFORATED ULCER  ? ? ?Consultations: ?Cardiology ? ? ?Procedures/Studies: ?CT HEAD WO CONTRAST ( ) ? ?Result Date: 03/25/2021 ?CLINICAL DATA:  Syncope EXAM: CT HEAD WITHOUT CONTRAST TECHNIQUE: Contiguous axial images were obtained from the base of the skull through the vertex without intravenous contrast. RADIATION DOSE REDUCTION: This exam was performed according to the departmental dose-optimization program which includes automated exposure control, adjustment of the mA and/or kV according to patient size and/or use of iterative reconstruction technique. COMPARISON:  05/08/2020 FINDINGS: Brain: No acute intracranial findings are seen in noncontrast CT brain. There is encephalomalacia in the posterior left occipital cortex. Cortical sulci are prominent. There are scattered calcifications in the basal ganglia. Vascular: Unremarkable. Skull: No fracture is seen. Sinuses/Orbits: Unremarkable. Other: None. IMPRESSION: No acute intracranial findings are seen in noncontrast CT brain. There is small old infarct in the left occipital cortex with no significant change. Atrophy. Electronically Signed   By: Ernie Avena M.D.   On: 03/25/2021 16:49  ? ?US Carotid Bilateral ? ?Result Date: 03/26/2021 ?CLINICAL DATA:  Syncope and history smoking. EXAM: BILATERAL CAROTID DUPLEX ULTRASOUND  TECHNIQUE: Wallace Cullens scale imaging, color Doppler and duplex ultrasound were performed of bilateral carotid and vertebral arteries in the neck. COMPARISON:  05/08/2020 FINDINGS: Criteria: Quantification of carotid stenosis is based on velocity parameters that correlate the residual internal carotid diameter with NASCET-based stenosis levels, using the diameter of the distal internal carotid lumen as the denominator for stenosis measurement. The following velocity measurements were obtained: RIGHT ICA:  62/24 cm/sec CCA:  69/11 cm/sec SYSTOLIC ICA/CCA RATIO:  0.9 ECA:  83 cm/sec LEFT ICA:  59/17 cm/sec CCA:  56/13 cm/sec SYSTOLIC ICA/CCA RATIO:  1.1 ECA:  78 cm/sec RIGHT CAROTID ARTERY: Stable intimal thickening and calcified plaque at the level of the right carotid bulb and proximal right ICA. Estimated right ICA stenosis remains less than 50%. RIGHT VERTEBRAL ARTERY: Antegrade flow with normal waveform and velocity. LEFT CAROTID ARTERY: Stable intimal thickening and partially calcified plaque at the level of the left carotid bulb and proximal left ICA. Estimated left ICA stenosis remains less than 50%. LEFT VERTEBRAL ARTERY: Antegrade flow with normal waveform and velocity. IMPRESSION: Stable plaque at both carotid bifurcations, right greater than left, with estimated bilateral ICA stenoses of less than 50%. Electronically Signed   By: Irish Lack M.D.   On: 03/26/2021 10:16  ? ?DG Chest Portable 1 View ? ?Result Date: 03/25/2021 ?CLINICAL DATA:  syncope, eval for chf EXAM: PORTABLE CHEST 1 VIEW COMPARISON:  None. FINDINGS: The cardiomediastinal silhouette is within normal limits. No pleural effusion. No pneumothorax. No mass or consolidation. Minimal subsegmental  atelectasis in the right lower lung. No acute osseous abnormality. IMPRESSION: No acute findings in the chest. Specifically, no findings to suggest congestive heart failure. Electronically Signed   By: Olive BassYasser  El-Abd M.D.   On: 03/25/2021 16:48   ? ?ECHOCARDIOGRAM COMPLETE ? ?Result Date: 03/26/2021 ?   ECHOCARDIOGRAM REPORT   Patient Name:   Arlana LindauNANCY A Rockers Date of Exam: 03/26/2021 Medical Rec #:  [id number]         Height:       63.5 in Accession #:    [id number]

## 2021-03-27 NOTE — Progress Notes (Signed)
Patient discharged per W/C to private vehicle, in stable condition, belongings returned. Patient ambulates with steady gait.  ?

## 2021-03-27 NOTE — Care Management (Signed)
[facility] to accept patient for PT OT services.  3n1 recommended by therapy, patient and family decline DME at this time. ?

## 2021-03-27 NOTE — Progress Notes (Signed)
[facility] Cardiology ? ? ? ?SUBJECTIVE: Patient resting comfortably no further episodes of significant dizziness lightheadedness or presyncope ? ? ?Vitals:  ? 03/26/21 1640 03/26/21 1922 03/27/21 0440 03/27/21 0750  ?BP: (!) 123/56 116/60 128/81 (!) 111/55  ?Pulse: (!) 49 (!) 59 (!) 42 (!) 51  ?Resp: 16 18 18 16   ?Temp: 98.6 ?F (37 ?C) 98.6 ?F (37 ?C) (!) 97.5 ?F (36.4 ?C) 98 ?F (36.7 ?C)  ?TempSrc: Oral Oral  Oral  ?SpO2: 98% 96% 97% 97%  ?Weight:      ?Height:      ? ? ? ?Intake/Output Summary (Last 24 hours) at 03/27/2021 0935 ?Last data filed at 03/26/2021 1900 ?Gross per 24 hour  ?Intake 360 ml  ?Output --  ?Net 360 ml  ? ? ? ? ?PHYSICAL EXAM ? ?General: Well developed, well nourished, in no acute distress ?HEENT:  Normocephalic and atramatic ?Neck:  No JVD.  ?Lungs: Clear bilaterally to auscultation and percussion. ?Heart: Bradycardic . Normal S1 and S2 without gallops or murmurs.  ?Abdomen: Bowel sounds are positive, abdomen soft and non-tender  ?Msk:  Back normal, normal gait. Normal strength and tone for age. ?Extremities: No clubbing, cyanosis or edema.   ?Neuro: Alert and oriented X 3. ?Psych:  Good affect, responds appropriately ? ? ?LABS: ?Basic Metabolic Panel: ?Recent Labs  ?  03/25/21 ?1632 03/26/21 ?03/28/21  ?NA 140 137  ?K 3.4* 3.6  ?CL 107 104  ?CO2 25 26  ?GLUCOSE 159* 102*  ?BUN 16 19  ?CREATININE 1.28* 1.18*  ?CALCIUM 9.2 9.2  ? ?Liver Function Tests: ?Recent Labs  ?  03/25/21 ?1632  ?AST 23  ?ALT 17  ?ALKPHOS 75  ?BILITOT 0.7  ?PROT 6.4*  ?ALBUMIN 3.4*  ? ?No results for input(s): LIPASE, AMYLASE in the last 72 hours. ?CBC: ?Recent Labs  ?  03/25/21 ?1632 03/26/21 ?03/28/21  ?WBC 6.0 9.3  ?HGB 11.8* 11.8*  ?HCT 37.5 36.6  ?MCV 84.7 83.4  ?PLT 282 272  ? ?Cardiac Enzymes: ?No results for input(s): CKTOTAL, CKMB, CKMBINDEX, TROPONINI in the last 72 hours. ?BNP: ?Invalid input(s): POCBNP ?D-Dimer: ?No results for input(s): DDIMER in the last 72 hours. ?Hemoglobin A1C: ?No results for input(s): HGBA1C in the last  72 hours. ?Fasting Lipid Panel: ?No results for input(s): CHOL, HDL, LDLCALC, TRIG, CHOLHDL, LDLDIRECT in the last 72 hours. ?Thyroid Function Tests: ?No results for input(s): TSH, T4TOTAL, T3FREE, THYROIDAB in the last 72 hours. ? ?Invalid input(s): FREET3 ?Anemia Panel: ?No results for input(s): VITAMINB12, FOLATE, FERRITIN, TIBC, IRON, RETICCTPCT in the last 72 hours. ? ?CT HEAD WO CONTRAST (7322) ? ?Result Date: 03/25/2021 ?CLINICAL DATA:  Syncope EXAM: CT HEAD WITHOUT CONTRAST TECHNIQUE: Contiguous axial images were obtained from the base of the skull through the vertex without intravenous contrast. RADIATION DOSE REDUCTION: This exam was performed according to the departmental dose-optimization program which includes automated exposure control, adjustment of the mA and/or kV according to patient size and/or use of iterative reconstruction technique. COMPARISON:  05/08/2020 FINDINGS: Brain: No acute intracranial findings are seen in noncontrast CT brain. There is encephalomalacia in the posterior left occipital cortex. Cortical sulci are prominent. There are scattered calcifications in the basal ganglia. Vascular: Unremarkable. Skull: No fracture is seen. Sinuses/Orbits: Unremarkable. Other: None. IMPRESSION: No acute intracranial findings are seen in noncontrast CT brain. There is small old infarct in the left occipital cortex with no significant change. Atrophy. Electronically Signed   By: 07/08/2020 M.D.   On: 03/25/2021 16:49  ? ?03/27/2021  Carotid Bilateral ? ?Result Date: 03/26/2021 ?CLINICAL DATA:  Syncope and history smoking. EXAM: BILATERAL CAROTID DUPLEX ULTRASOUND TECHNIQUE: Wallace Cullens scale imaging, color Doppler and duplex ultrasound were performed of bilateral carotid and vertebral arteries in the neck. COMPARISON:  05/08/2020 FINDINGS: Criteria: Quantification of carotid stenosis is based on velocity parameters that correlate the residual internal carotid diameter with NASCET-based stenosis levels, using  the diameter of the distal internal carotid lumen as the denominator for stenosis measurement. The following velocity measurements were obtained: RIGHT ICA:  62/24 cm/sec CCA:  69/11 cm/sec SYSTOLIC ICA/CCA RATIO:  0.9 ECA:  83 cm/sec LEFT ICA:  59/17 cm/sec CCA:  56/13 cm/sec SYSTOLIC ICA/CCA RATIO:  1.1 ECA:  78 cm/sec RIGHT CAROTID ARTERY: Stable intimal thickening and calcified plaque at the level of the right carotid bulb and proximal right ICA. Estimated right ICA stenosis remains less than 50%. RIGHT VERTEBRAL ARTERY: Antegrade flow with normal waveform and velocity. LEFT CAROTID ARTERY: Stable intimal thickening and partially calcified plaque at the level of the left carotid bulb and proximal left ICA. Estimated left ICA stenosis remains less than 50%. LEFT VERTEBRAL ARTERY: Antegrade flow with normal waveform and velocity. IMPRESSION: Stable plaque at both carotid bifurcations, right greater than left, with estimated bilateral ICA stenoses of less than 50%. Electronically Signed   By: Irish Lack M.D.   On: 03/26/2021 10:16  ? ?DG Chest Portable 1 View ? ?Result Date: 03/25/2021 ?CLINICAL DATA:  syncope, eval for chf EXAM: PORTABLE CHEST 1 VIEW COMPARISON:  None. FINDINGS: The cardiomediastinal silhouette is within normal limits. No pleural effusion. No pneumothorax. No mass or consolidation. Minimal subsegmental atelectasis in the right lower lung. No acute osseous abnormality. IMPRESSION: No acute findings in the chest. Specifically, no findings to suggest congestive heart failure. Electronically Signed   By: Olive Bass M.D.   On: 03/25/2021 16:48  ? ?ECHOCARDIOGRAM COMPLETE ? ?Result Date: 03/26/2021 ?   ECHOCARDIOGRAM REPORT   Patient Name:   Tracy Barton Date of Exam: 03/26/2021 Medical Rec #:  [id number]         Height:       63.5 in Accession #:    [id number]        Weight:       93.5 lb Date of Birth:  May 04, 1945        BSA:          1.408 m? Patient Age:    75 years          BP:            121/63 mmHg Patient Gender: F                 HR:           50 bpm. Exam Location:  [facility] Procedure: 2D Echo Indications:     Syncope R55  History:         Patient has no prior history of Echocardiogram examinations.  Sonographer:     Overton Mam RDCS Referring Phys:  [id number] Sunnie Nielsen Diagnosing Phys: Alwyn Pea MD IMPRESSIONS  1. Left ventricular ejection fraction, by estimation, is 55 to 60%. Left ventricular ejection fraction by PLAX is 60 %. The left ventricle has normal function. The left ventricle has no regional wall motion abnormalities. Left ventricular diastolic parameters were normal.  2. Right ventricular systolic function is normal. The right ventricular size is normal.  3. The mitral valve is normal in structure. No evidence of mitral valve regurgitation.  4. The aortic valve is normal in structure. Aortic valve regurgitation is not visualized. FINDINGS  Left Ventricle: Left ventricular ejection fraction, by estimation, is 55 to 60%. Left ventricular ejection fraction by PLAX is 60 %. The left ventricle has normal function. The left ventricle has no regional wall motion abnormalities. The left ventricular internal cavity size was normal in size. There is no left ventricular hypertrophy. Left ventricular diastolic parameters were normal. Right Ventricle: The right ventricular size is normal. No increase in right ventricular wall thickness. Right ventricular systolic function is normal. Left Atrium: Left atrial size was normal in size. Right Atrium: Right atrial size was normal in size. Pericardium: There is no evidence of pericardial effusion. Mitral Valve: The mitral valve is normal in structure. No evidence of mitral valve regurgitation. Tricuspid Valve: The tricuspid valve is normal in structure. Tricuspid valve regurgitation is not demonstrated. Aortic Valve: The aortic valve is normal in structure. Aortic valve regurgitation is not visualized. Aortic valve peak gradient measures  6.4 mmHg. Pulmonic Valve: The pulmonic valve was normal in structure. Pulmonic valve regurgitation is not visualized. Aorta: The ascending aorta was not well visualized. IAS/Shunts: No atrial level

## 2021-03-27 NOTE — Progress Notes (Signed)
Daughter Ferne Reus notified of discharge. Offered [facility], Mrs Criss Rosales stating there is no room in the house, she will help her mother go to the bathroom. Declined [facility]. ?

## 2021-05-25 ENCOUNTER — Other Ambulatory Visit: Payer: Self-pay

## 2021-05-25 ENCOUNTER — Observation Stay
Admission: RE | Admit: 2021-05-25 | Discharge: 2021-05-26 | Disposition: A | Discharge status: Home / Self Care | Payer: Medicare Other | Attending: Cardiology | Admitting: Cardiology

## 2021-05-25 ENCOUNTER — Encounter: Payer: Self-pay | Admitting: Cardiology

## 2021-05-25 ENCOUNTER — Encounter
Admission: RE | Disposition: A | Discharge status: Home / Self Care | Payer: Self-pay | Source: Home / Self Care | Attending: Cardiology

## 2021-05-25 DIAGNOSIS — R001 Bradycardia, unspecified: Secondary | ICD-10-CM

## 2021-05-25 DIAGNOSIS — I495 Sick sinus syndrome: Principal | ICD-10-CM | POA: Insufficient documentation

## 2021-05-25 DIAGNOSIS — Z01818 Encounter for other preprocedural examination: Secondary | ICD-10-CM

## 2021-05-25 HISTORY — PX: PACEMAKER IMPLANT: EP1218

## 2021-05-25 SURGERY — PACEMAKER IMPLANT
Anesthesia: Moderate Sedation

## 2021-05-25 MED ORDER — TRAMADOL HCL 50 MG PO TABS
ORAL_TABLET | ORAL | Status: AC
  Filled 2021-05-25: qty 1

## 2021-05-25 MED ORDER — TRAZODONE HCL 100 MG PO TABS
100.0000 mg | ORAL_TABLET | Freq: Every evening | ORAL | Status: DC | PRN
  Administered 2021-05-25: 100 mg via ORAL
  Filled 2021-05-25: qty 1

## 2021-05-25 MED ORDER — SODIUM CHLORIDE 0.9 % IV SOLN: INTRAVENOUS | Status: DC

## 2021-05-25 MED ORDER — FENTANYL CITRATE (PF) 100 MCG/2ML IJ SOLN
INTRAMUSCULAR | Status: AC
  Filled 2021-05-25: qty 2

## 2021-05-25 MED ORDER — ACETAMINOPHEN 325 MG PO TABS: 325.0000 mg | ORAL_TABLET | ORAL | Status: DC | PRN

## 2021-05-25 MED ORDER — THIAMINE HCL 100 MG PO TABS
100.0000 mg | ORAL_TABLET | Freq: Every day | ORAL | Status: DC
  Administered 2021-05-25 – 2021-05-26 (×2): 100 mg via ORAL
  Filled 2021-05-25: qty 1

## 2021-05-25 MED ORDER — CEFAZOLIN SODIUM-DEXTROSE 2-4 GM/100ML-% IV SOLN
2.0000 g | INTRAVENOUS | Status: AC
  Administered 2021-05-25: 2 g via INTRAVENOUS

## 2021-05-25 MED ORDER — HEPARIN (PORCINE) IN NACL 1000-0.9 UT/500ML-% IV SOLN
INTRAVENOUS | Status: AC
  Filled 2021-05-25: qty 1000

## 2021-05-25 MED ORDER — ATORVASTATIN CALCIUM 20 MG PO TABS
40.0000 mg | ORAL_TABLET | Freq: Every day | ORAL | Status: DC
  Administered 2021-05-25 – 2021-05-26 (×2): 40 mg via ORAL
  Filled 2021-05-25 (×2): qty 2

## 2021-05-25 MED ORDER — ONDANSETRON HCL 4 MG/2ML IJ SOLN: 4.0000 mg | Freq: Four times a day (QID) | INTRAMUSCULAR | Status: DC | PRN

## 2021-05-25 MED ORDER — DONEPEZIL HCL 5 MG PO TABS
10.0000 mg | ORAL_TABLET | Freq: Every day | ORAL | Status: DC
  Administered 2021-05-25: 10 mg via ORAL
  Filled 2021-05-25: qty 2

## 2021-05-25 MED ORDER — TRAMADOL HCL 50 MG PO TABS
50.0000 mg | ORAL_TABLET | Freq: Two times a day (BID) | ORAL | Status: DC | PRN
  Administered 2021-05-25: 50 mg via ORAL

## 2021-05-25 MED ORDER — SODIUM CHLORIDE 0.9 % IV SOLN
80.0000 mg | INTRAVENOUS | Status: AC
  Administered 2021-05-25: 80 mg
  Filled 2021-05-25: qty 80

## 2021-05-25 MED ORDER — LIDOCAINE HCL 1 % IJ SOLN
INTRAMUSCULAR | Status: AC
  Filled 2021-05-25: qty 20

## 2021-05-25 MED ORDER — CELECOXIB 200 MG PO CAPS
200.0000 mg | ORAL_CAPSULE | Freq: Every day | ORAL | Status: DC
  Administered 2021-05-25 – 2021-05-26 (×2): 200 mg via ORAL
  Filled 2021-05-25 (×2): qty 1

## 2021-05-25 MED ORDER — LIDOCAINE HCL 1 % IJ SOLN
INTRAMUSCULAR | Status: AC
  Filled 2021-05-25: qty 40

## 2021-05-25 MED ORDER — MIDAZOLAM HCL 2 MG/2ML IJ SOLN
INTRAMUSCULAR | Status: DC | PRN
  Administered 2021-05-25 (×4): .5 mg via INTRAVENOUS

## 2021-05-25 MED ORDER — LIDOCAINE HCL (PF) 1 % IJ SOLN
INTRAMUSCULAR | Status: DC | PRN
  Administered 2021-05-25: 20 mL

## 2021-05-25 MED ORDER — FENTANYL CITRATE (PF) 100 MCG/2ML IJ SOLN
INTRAMUSCULAR | Status: DC | PRN
  Administered 2021-05-25 (×2): 25 ug via INTRAVENOUS
  Administered 2021-05-25 (×2): 12.5 ug via INTRAVENOUS

## 2021-05-25 MED ORDER — HEPARIN (PORCINE) IN NACL 1000-0.9 UT/500ML-% IV SOLN
INTRAVENOUS | Status: DC | PRN
  Administered 2021-05-25: 500 mL

## 2021-05-25 MED ORDER — SERTRALINE HCL 25 MG PO TABS
25.0000 mg | ORAL_TABLET | Freq: Every day | ORAL | Status: DC
Start: 2021-05-25 — End: 2021-05-26
  Administered 2021-05-25 – 2021-05-26 (×2): 25 mg via ORAL
  Filled 2021-05-25 (×2): qty 1

## 2021-05-25 MED ORDER — MIDAZOLAM HCL 2 MG/2ML IJ SOLN
INTRAMUSCULAR | Status: AC
  Filled 2021-05-25: qty 2

## 2021-05-25 MED ORDER — CEPHALEXIN 500 MG PO CAPS
500.0000 mg | ORAL_CAPSULE | Freq: Two times a day (BID) | ORAL | Status: DC
  Administered 2021-05-26: 500 mg via ORAL
  Filled 2021-05-25: qty 1

## 2021-05-25 MED ORDER — MEMANTINE HCL 5 MG PO TABS
5.0000 mg | ORAL_TABLET | Freq: Two times a day (BID) | ORAL | Status: DC
Start: 2021-05-25 — End: 2021-05-26
  Administered 2021-05-25 – 2021-05-26 (×3): 5 mg via ORAL
  Filled 2021-05-25 (×3): qty 1

## 2021-05-25 MED ORDER — CEFAZOLIN SODIUM-DEXTROSE 1-4 GM/50ML-% IV SOLN
1.0000 g | Freq: Four times a day (QID) | INTRAVENOUS | Status: AC
  Administered 2021-05-25 – 2021-05-26 (×3): 1 g via INTRAVENOUS
  Filled 2021-05-25 (×3): qty 50

## 2021-05-25 SURGICAL SUPPLY — 27 items
CABLE SURG 12 DISP A/V CHANNEL (MISCELLANEOUS) ×1 IMPLANT
COVER EZ STRL 42X30 (DRAPES) ×1 IMPLANT
COVER SURGICAL LIGHT HANDLE (MISCELLANEOUS) ×1 IMPLANT
DEVICE DSSCT PLSMBLD 3.0S LGHT (MISCELLANEOUS) IMPLANT
DRAPE INCISE 23X17 IOBAN STRL (DRAPES) ×1
DRAPE INCISE 23X17 STRL (DRAPES) IMPLANT
DRAPE INCISE IOBAN 23X17 STRL (DRAPES) ×1 IMPLANT
INTRO PACEMAKR LEAD 7FR 23CM (INTRODUCER) ×2
INTRO PACEMAKR LEAD 9FR 23CM (INTRODUCER) ×2
INTRODUCER PACEMKR LD 7FR 23CM (INTRODUCER) IMPLANT
INTRODUCER PACEMKR LD 9FR 23CM (INTRODUCER) IMPLANT
IPG PACE AZUR XT DR MRI W1DR01 (Pacemaker) IMPLANT
LEAD CAPSURE NOVUS 45CM (Lead) ×1 IMPLANT
LEAD CAPSURE NOVUS 5076-52CM (Lead) ×1 IMPLANT
PACE AZURE XT DR MRI W1DR01 (Pacemaker) ×2 IMPLANT
PAD ELECT DEFIB RADIOL ZOLL (MISCELLANEOUS) ×2 IMPLANT
PLASMABLADE 3.0S W/LIGHT (MISCELLANEOUS) ×2
SLING ARM IMMOBILIZER MED (SOFTGOODS) ×1 IMPLANT
SPONGE XRAY 4X4 16PLY STRL (MISCELLANEOUS) ×3 IMPLANT
SUT PROLENE 0 CT 1 30 (SUTURE) IMPLANT
SUT SILK 0 FSL (SUTURE) ×1 IMPLANT
SUT VIC AB 2-0 CT1 27 (SUTURE) ×2
SUT VIC AB 2-0 CT1 TAPERPNT 27 (SUTURE) IMPLANT
SUT VICRYL 4-0  27 PS-2 BARIAT (SUTURE) ×2
SUT VICRYL 4-0 27 PS-2 BARIAT (SUTURE) ×1
SUTURE VICRYL 4-0 27 PS-2 BART (SUTURE) IMPLANT
TRAY PACEMAKER INSERTION (PACKS) ×2 IMPLANT

## 2021-05-25 NOTE — Progress Notes (Signed)
MD spoke with pt. Daughter Brownynn via phone post pacemaker implant. Daughter verbalized understanding of conversation.

## 2021-05-25 NOTE — Plan of Care (Signed)

## 2021-05-25 NOTE — Progress Notes (Signed)
Pt. Med. With 1 Tramadol 50 mg p.o. for c/o "8" out of 10 pain to left upper arm/shoulder pain. Pt. States "it's rough." Pt. Pleasantly confused/ cooperative. Pt. Reorinted to immediate surroundings. Pt. Aware of self and thought she was at [facility]." Pt. Daughter and her S.O. here at bedside with pt.

## 2021-05-26 ENCOUNTER — Encounter: Payer: Self-pay | Admitting: Cardiology

## 2021-05-26 ENCOUNTER — Observation Stay: Payer: Medicare Other

## 2021-05-26 DIAGNOSIS — I495 Sick sinus syndrome: Secondary | ICD-10-CM | POA: Diagnosis not present

## 2021-05-26 IMAGING — DX DG CHEST 1V PORT
1 series · 1 of 1 positions shown · non-contrast
Comparison: [DATE].

CLINICAL DATA: Pacemaker placement.

EXAM:
PORTABLE CHEST 1 VIEW

[chest ap]
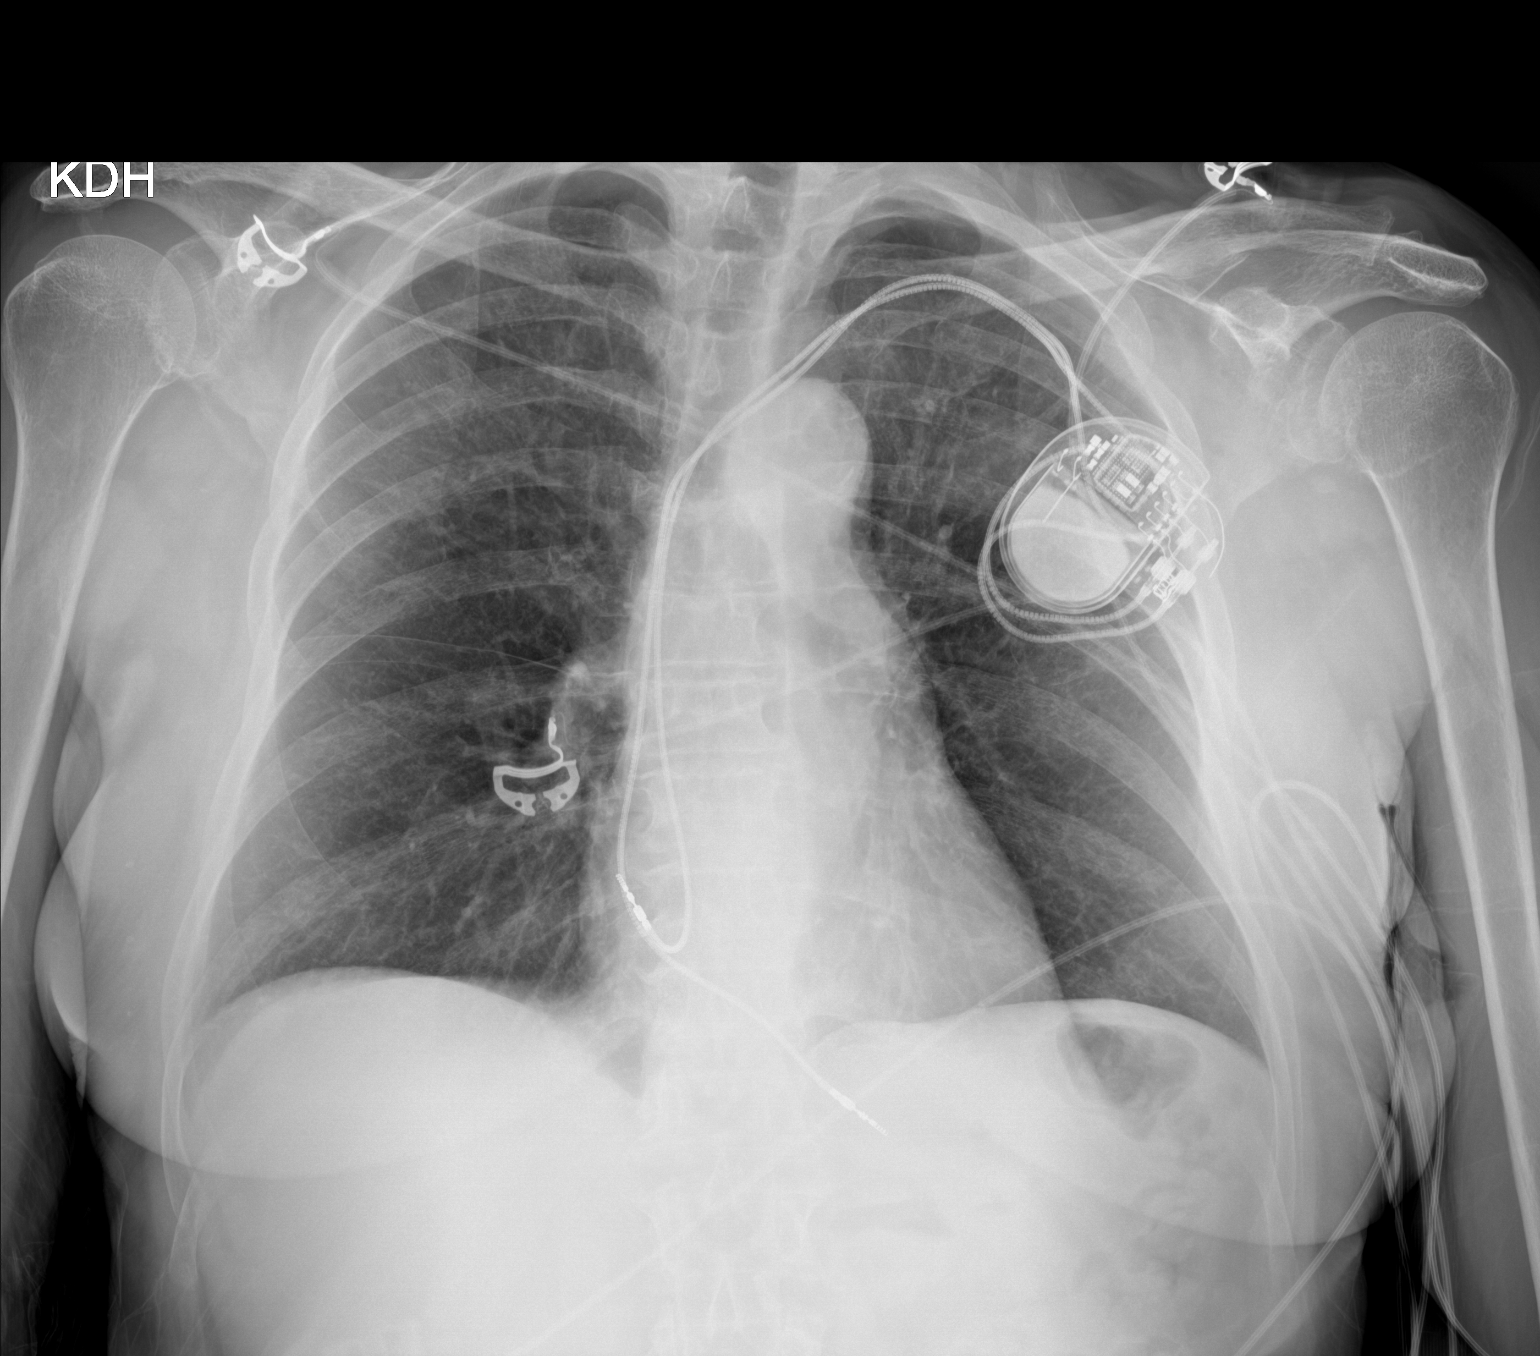

[1 of 1 positions shown; findings below may reference images not displayed]

FINDINGS: The heart size and mediastinal contours are within normal limits.
Interval placement of left-sided pacemaker with leads in grossly
good position. No pneumothorax is noted. Both lungs are clear. The
visualized skeletal structures are unremarkable.
IMPRESSION: Interval placement of left-sided pacemaker with leads in grossly
good position.

## 2021-05-26 MED ORDER — CEPHALEXIN 500 MG PO CAPS: 500.0000 mg | ORAL_CAPSULE | Freq: Two times a day (BID) | ORAL | 0 refills | Status: AC

## 2021-05-26 NOTE — Progress Notes (Signed)
   05/26/21 1035  Clinical Encounter Type  Visited With Patient and family together  Visit Type Initial;Social support  Spiritual Encounters  Spiritual Needs Other (Comment) (AD)   Chaplain Burris responded to a page for support in notarizing advanced directive. Chaplain B looked over the document and asked Pt if she had any questions. Document was complete and correct and no questions put forward by Pt or family. Due to readiness of d/c and family desire to leave quickly, Chaplain B advised having document notarized at their bank (credit union, no cost).  Chaplain B further advised family to come by front desk at their convenience and request a chaplain. At that time the notarized document can be scanned and included in electronic record. Chaplain B explained this and advised care with the original until that time.  Pt named Delano Metz as Management consultant.

## 2021-05-26 NOTE — Discharge Instructions (Addendum)
Please do not shower or submerge yourself in water until tomorrow, Friday 5/26 at the earliest. Try and keep the clear bandage in place for the next week, but if it falls off that is ok. Do not remove the steri strips (thin rectangular white strips) directly over the incision. Please take Keflex (cephalexin) which is an antibiotic as prescribed, along with your other medications. You will be getting a call from the [employer] representative to set up your pacemaker at home. Do not hesitate to call Dr. Etta Quill office with questions in the interim.

## 2021-05-26 NOTE — Discharge Summary (Signed)
Physician Discharge Summary      Patient ID: Tracy Barton MRN: [id number] DOB/AGE: Mar 01, 1945 76 y.o.  Admit date: 05/25/2021 Discharge date: 05/26/2021  Primary Discharge Diagnosis sick sinus syndrome Secondary Discharge Diagnosis same s/p [employer] dual chamber pacemaker   Significant Diagnostic Studies: successful dual chamber pacemaker placement  Consults: none  Hospital Course: The patient was brought to the cardiac cath lab and underwent dual chamber pacemaker placement with Dr. Marcina Millard on 05/25/2021. The patient tolerated with procedure well without periprocedural complications. The device was interrogated post procedure and is functioning appropriately with good threshold and lead impedance. On 5/25 the left upper chest was examined and was found to be without significant tenderness to palpation, erythema, or obvious signs of infections. The patient was given care instructions and return precautions and will follow up in office with her regular cardiologist Dr. Juliann Pares in 1 week, or sooner if needed.     Discharge Exam: Blood pressure 112/72, pulse 61, temperature 98.1 F (36.7 C), temperature source Oral, resp. rate 17, height 5' 3.5" (1.613 m), weight 50.6 kg, SpO2 96 %. PHYSICAL EXAM General: Pleasant elderly black and thin black female, well nourished, in no acute distress.  HEENT:  Normocephalic and atraumatic. Neck:   No JVD.  Lungs: Normal respiratory effort on room air.  Clear to ascultation bilaterally. Heart: atrial paced. Normal S1 and S2 without gallops or murmurs.  Left chest: gauze and tegaderm in place without tenderness, erythema, or signs of infection Abdomen: non-distended appearing.  Msk: Normal strength and tone for age. Extremities: No clubbing, cyanosis, edema.  Neuro: Alert and oriented to self only Psych:  mood appropriate for situation.    Labs:   Lab Results  Component Value Date   WBC 9.3 03/26/2021   HGB 11.8 (L)  03/26/2021   HCT 36.6 03/26/2021   MCV 83.4 03/26/2021   PLT 272 03/26/2021   No results for input(s): NA, K, CL, CO2, BUN, CREATININE, CALCIUM, PROT, BILITOT, ALKPHOS, ALT, AST, GLUCOSE in the last 168 hours.  Invalid input(s): LABALBU    Radiology: 05/26/21 chest xray post procedure with appropriate lead placement without evidence of pneumothorax  EKG: atrial paced at 62 BPM  FOLLOW UP PLANS AND APPOINTMENTS  Allergies as of 05/26/2021       Reactions   Nsaids Other (See Comments)   PERFORATED ULCER        Medication List     TAKE these medications    atorvastatin 40 MG tablet Commonly known as: Lipitor Take 1 tablet (40 mg total) by mouth at bedtime.   cephALEXin 500 MG capsule Commonly known as: KEFLEX Take 1 capsule (500 mg total) by mouth 2 (two) times daily for 7 days.   donepezil 10 MG tablet Commonly known as: ARICEPT Take 10 mg by mouth at bedtime.   memantine 5 MG tablet Commonly known as: NAMENDA Take 5 mg by mouth 2 (two) times daily.   sertraline 25 MG tablet Commonly known as: ZOLOFT Take 25 mg by mouth daily.   traMADol 50 MG tablet Commonly known as: ULTRAM Take by mouth 2 (two) times daily.   traZODone 50 MG tablet Commonly known as: DESYREL Take 50-100 mg by mouth at bedtime as needed for sleep.        Follow-up Information     Alwyn Pea, MD. Go in 1 week(s).   Specialties: Cardiology, Internal Medicine Contact information: [address] [place] [address] [phone]  BRING ALL MEDICATIONS WITH YOU TO FOLLOW UP APPOINTMENTS  Time spent with patient : 35 mins Signed:  Cheryln Manly Alexa Golebiewski PA-C 05/26/2021, 7:55 AM

## 2021-10-07 ENCOUNTER — Other Ambulatory Visit: Payer: Self-pay | Admitting: Family Medicine

## 2021-10-07 ENCOUNTER — Ambulatory Visit
Admission: RE | Admit: 2021-10-07 | Discharge: 2021-10-07 | Disposition: A | Discharge status: Home / Self Care | Payer: Medicare Other | Source: Ambulatory Visit | Attending: Family Medicine | Admitting: Family Medicine

## 2021-10-07 DIAGNOSIS — R059 Cough, unspecified: Secondary | ICD-10-CM | POA: Insufficient documentation

## 2021-12-01 ENCOUNTER — Encounter: Payer: Self-pay | Admitting: Pulmonary Disease

## 2021-12-01 ENCOUNTER — Ambulatory Visit (INDEPENDENT_AMBULATORY_CARE_PROVIDER_SITE_OTHER): Payer: Medicare Other | Admitting: Pulmonary Disease

## 2021-12-01 VITALS — BP 138/82 | HR 62 | Temp 98.2°F | Ht 63.5 in | Wt 131.2 lb

## 2021-12-01 DIAGNOSIS — Z87891 Personal history of nicotine dependence: Secondary | ICD-10-CM

## 2021-12-01 DIAGNOSIS — K219 Gastro-esophageal reflux disease without esophagitis: Secondary | ICD-10-CM

## 2021-12-01 DIAGNOSIS — R053 Chronic cough: Secondary | ICD-10-CM | POA: Diagnosis not present

## 2021-12-01 DIAGNOSIS — I495 Sick sinus syndrome: Secondary | ICD-10-CM | POA: Diagnosis not present

## 2021-12-01 DIAGNOSIS — J449 Chronic obstructive pulmonary disease, unspecified: Secondary | ICD-10-CM

## 2021-12-01 DIAGNOSIS — R0989 Other specified symptoms and signs involving the circulatory and respiratory systems: Secondary | ICD-10-CM

## 2021-12-01 LAB — NITRIC OXIDE: Nitric Oxide: 15

## 2021-12-01 MED ORDER — SPIRIVA RESPIMAT 2.5 MCG/ACT IN AERS: 2.0000 | INHALATION_SPRAY | Freq: Every day | RESPIRATORY_TRACT | 11 refills | Status: DC

## 2021-12-01 MED ORDER — SPIRIVA RESPIMAT 1.25 MCG/ACT IN AERS: 2.0000 | INHALATION_SPRAY | Freq: Every day | RESPIRATORY_TRACT | 0 refills | Status: DC

## 2021-12-01 NOTE — Progress Notes (Signed)
Subjective:    Patient ID: Tracy Barton, female    DOB: 1945/08/17, 76 y.o.   MRN: [id number] Patient Care Team: Donnie Coffin, MD as PCP - General (Family Medicine)  Chief Complaint  Patient presents with   Consult    Cough with yellow sputum for 6 months. SOB with exertion. No wheezing.    HPI Patient is a 76 year old former smoker (quit 05/2021, 26.5 PY) presents for evaluation of a cough of 6 months duration.  Presents today with her daughter.  Patient has mild dementia so daughter provides most of the history.  Per the patient's daughter the patient had a syncopal episode in May 2023 and was subsequently noted to have symptomatic bradycardia and sick sinus syndrome.  She underwent dual-chamber pacer placement.  Since then the patient has noted the cough.  Cough is worse when she lays down however it can occur at any time during the day.  It is for the most part nonproductive though occasionally she may have some yellow sputum.  He was given a trial of omeprazole for approximately 1 month which did not have much in the way of effect.  She also try Zyrtec at bedtime without relief.  Subsequently she was given a trial of albuterol and Flovent without help.  She does not describe heartburn or gastroesophageal reflux per se however on a radiographic examination in January she was noted to have reflux to the midesophagus.  She has been on Fosamax for "a while" and Namenda for approximately 1 year.  These medications can sometimes be associated with worsening cough.  She is not on ACE inhibitors.  Of note during the visit the patient was not noted to cough at any given time but occasionally would have a hiccup noted.  She has not had any recent fevers, chills or sweats.  No chest pain.  No lower extremity edema, no calf tenderness.   Review of Systems A 10 point review of systems was performed and it is as noted above otherwise negative.  Past Medical History:  Diagnosis Date   Blood  transfusion without reported diagnosis    Depression    Neuromuscular disorder [facility])    Past Surgical History:  Procedure Laterality Date   BRAIN SURGERY     LAPAROSCOPY N/A 01/10/2020   Procedure: LAPAROSCOPY DIAGNOSTIC laparoscopic washout omental patch splenectomy;  Surgeon: Michael Boston, MD;  Location: [facility];  Service: General;  Laterality: N/A;   LAPAROTOMY N/A 01/10/2020   Procedure: EXPLORATORY LAPAROTOMY;  Surgeon: Michael Boston, MD;  Location: [facility];  Service: General;  Laterality: N/A;   PACEMAKER IMPLANT N/A 05/25/2021   Procedure: PACEMAKER IMPLANT;  Surgeon: Isaias Cowman, MD;  Location: [place] [facility];  Service: Cardiovascular;  Laterality: N/A;   TUBAL LIGATION     Patient Active Problem List   Diagnosis Date Noted   Sick sinus syndrome ([place]) 05/25/2021   History of cardioembolic cerebrovascular accident (CVA) 03/25/2021   Bradycardia    Syncope and collapse    CVA (cerebral vascular accident) ([place]) 05/08/2020   Tobacco use 05/08/2020   Syncope/ presyncope - chronic, worsening  02/01/2020   COVID-19 02/01/2020   Sinus bradycardia 02/01/2020   H pylori ulcer [id number]   Acute metabolic encephalopathy [id number]   Chronic alcohol abuse 01/23/2020   Hypokalemia 01/23/2020   Hypophosphatemia 01/23/2020   Acute blood loss anemia 01/23/2020   Essential hypertension 01/23/2020   Protein-calorie malnutrition, severe 01/15/2020   Perforated prepyloric gastric ulcer s/p omental Phillip Heal patch  01/10/2020 01/10/2020   History of medication noncompliance 01/10/2020   ARF (acute renal failure) (HCC) 01/10/2020   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   Hep C w/o coma, chronic (HCC) 09/12/2014   Head injury 09/11/2014   Back injury 04/15/2014   Smoker 03/24/2013   Gastritis and gastroduodenitis 03/24/2013   Loss of weight 03/24/2013   Unspecified vitamin D deficiency 03/24/2013   Trigeminal neuralgia 06/01/2011   Family History  Problem Relation Age of Onset    Stroke Mother    Cancer Mother    Cancer Brother    Mental illness Maternal Grandmother    Social History   Tobacco Use   Smoking status: Some Days    Packs/day: 0.50    Years: 53.00    Total pack years: 26.50    Types: Cigarettes   Smokeless tobacco: Never  Substance Use Topics   Alcohol use: Yes    Alcohol/week: 2.0 standard drinks of alcohol    Types: 2 Cans of beer per week   Social History   Tobacco Use   Smoking status: Some Days    Packs/day: 0.50    Years: 53.00    Total pack years: 26.50    Types: Cigarettes   Smokeless tobacco: Never  Substance Use Topics   Alcohol use: Yes    Alcohol/week: 2.0 standard drinks of alcohol    Types: 2 Cans of beer per week   Allergies  Allergen Reactions   Nsaids Other (See Comments)    PERFORATED ULCER   Current Meds  Medication Sig   albuterol (VENTOLIN HFA) 108 (90 Base) MCG/ACT inhaler Inhale 1 puff into the lungs every 4 (four) hours as needed.   alendronate (FOSAMAX) 70 MG tablet Take 70 mg by mouth once a week.   atorvastatin (LIPITOR) 40 MG tablet Take 1 tablet (40 mg total) by mouth at bedtime.   celecoxib (CELEBREX) 200 MG capsule Take 1 capsule by mouth daily.   donepezil (ARICEPT) 10 MG tablet Take 10 mg by mouth at bedtime.   memantine (NAMENDA) 5 MG tablet Take 5 mg by mouth 2 (two) times daily.   sertraline (ZOLOFT) 25 MG tablet Take 25 mg by mouth daily.   thiamine (VITAMIN B1) 100 MG tablet Take by mouth. Take 1 tablet (100 mg total) by mouth once daily   traMADol (ULTRAM) 50 MG tablet Take 50 mg by mouth every 12 (twelve) hours as needed.   traZODone (DESYREL) 50 MG tablet Take 50-100 mg by mouth at bedtime as needed for sleep.   Immunization History  Administered Date(s) Administered   Fluad Quad(high Dose 65+) 10/02/2021   Influenza,inj,Quad PF,6+ Mos 09/11/2014   Pneumococcal Polysaccharide-23 03/24/2013       Objective:   Physical Exam BP 138/82 (BP Location: Left Arm, Cuff Size: Normal)    Pulse 62   Temp 98.2 F (36.8 C)   Ht 5' 3.5" (1.613 m)   Wt 131 lb 3.2 oz (59.5 kg)   SpO2 97%   BMI 22.88 kg/m  GENERAL: Well-developed, well-nourished woman, no acute distress.  Does not speak much. HEAD: Normocephalic, atraumatic.  EYES: Pupils equal, round, reactive to light.  No scleral icterus.  MOUTH: Nose/mouth/throat not examined due to masking requirements for COVID 19. NECK: Supple. No thyromegaly. Trachea midline. No JVD.  No adenopathy. PULMONARY: Good air entry bilaterally.  No adventitious sounds. CARDIOVASCULAR: S1 and S2. Regular rate and rhythm.  No rubs, murmurs or gallops heard. ABDOMEN: Benign. MUSCULOSKELETAL: No joint deformity, no clubbing, no edema.  NEUROLOGIC: No overt focal deficit, speech fluent when she speaks. SKIN: Intact,warm,dry. PSYCH: Flat affect.  Very passive.  Lab Results  Component Value Date   NITRICOXIDE 15 12/01/2021      Assessment & Plan:     ICD-10-CM   1. Chronic cough  R05.3 Nitric oxide    Pulmonary Function Test [facility] Only    CT CHEST WO CONTRAST   Cough started after placement of pacemaker  Query diaphragmatic irritation from pacer lead Patient noted to have "hiccups" intermittently during visit    2. COPD suggested by initial evaluation ([place])  J44.9    Will obtain PFTs No evidence of type II inflammation in the airway Trial of Spiriva    3. Chronic GERD  K21.9    May need to revisit PPI use Query if Fosamax worsening this    4. Sick sinus syndrome (HCC)  I49.5    To adds complexity to her management Pacer in situ Query diaphragmatic irritation from pacer lead    5. Former smoker  Z87.891      Orders Placed This Encounter  Procedures   CT CHEST WO CONTRAST    Cough after pacer placement. CT to check lead placement    Standing Status:   Future    Number of Occurrences:   1    Standing Expiration Date:   12/02/2022    Order Specific Question:   Preferred imaging location?    Answer:    [facility]    Nitric oxide   Pulmonary Function Test [facility] Only    Standing Status:   Future    Standing Expiration Date:   12/02/2022    Order Specific Question:   Full PFT: includes the following: basic spirometry, spirometry pre & post bronchodilator, diffusion capacity (DLCO), lung volumes    Answer:   Full PFT    Order Specific Question:   This test can only be performed at    Answer:   [place] ordered this encounter  Medications   Tiotropium Bromide Monohydrate (SPIRIVA RESPIMAT) 2.5 MCG/ACT AERS    Sig: Inhale 2 puffs into the lungs daily.    Dispense:  4 g    Refill:  11   Tiotropium Bromide Monohydrate (SPIRIVA RESPIMAT) 1.25 MCG/ACT AERS    Sig: Inhale 2 puffs into the lungs daily.    Dispense:  4 g    Refill:  0   Will obtain CT chest to evaluate pacer lead placement.  Query diaphragmatic irritation from pacer lead.  Cough appears to be mostly positional.  Will obtain PFTs to exclude bronchial reactivity.  May need to revisit PPI use and consider discontinuation of Fosamax.  We will see patient in follow-up in 4 to 6 weeks time she is to call sooner should any problems arise.  Renold Don, MD Advanced Bronchoscopy PCCM [facility]    *This note was dictated using voice recognition software/Dragon.  Despite best efforts to proofread, errors can occur which can change the meaning. Any transcriptional errors that result from this process are unintentional and may not be fully corrected at the time of dictation.

## 2021-12-01 NOTE — Patient Instructions (Signed)
We are going to schedule you for breathing tests.  We are going to get a CT scan of the chest that will tell me more in detail where your pacer leads are.  I will send the note to Dr. Darrold Junker to let him know our findings.  We are giving you a trial of an inhaler called Spiriva this will be 2 puffs once a day.  In 4 to 6 weeks time call sooner should any new problems arise.

## 2021-12-13 ENCOUNTER — Encounter: Payer: Self-pay | Admitting: Pulmonary Disease

## 2021-12-13 ENCOUNTER — Telehealth: Payer: Self-pay | Admitting: Pulmonary Disease

## 2021-12-13 ENCOUNTER — Ambulatory Visit
Admission: RE | Admit: 2021-12-13 | Discharge: 2021-12-13 | Disposition: A | Discharge status: Home / Self Care | Payer: Medicare Other | Source: Ambulatory Visit | Attending: Pulmonary Disease | Admitting: Pulmonary Disease

## 2021-12-13 ENCOUNTER — Telehealth: Payer: Self-pay

## 2021-12-13 ENCOUNTER — Ambulatory Visit (INDEPENDENT_AMBULATORY_CARE_PROVIDER_SITE_OTHER): Payer: Medicare Other | Admitting: Pulmonary Disease

## 2021-12-13 VITALS — BP 120/78 | HR 74 | Temp 98.1°F | Ht 63.5 in | Wt 133.4 lb

## 2021-12-13 DIAGNOSIS — J449 Chronic obstructive pulmonary disease, unspecified: Secondary | ICD-10-CM

## 2021-12-13 DIAGNOSIS — K219 Gastro-esophageal reflux disease without esophagitis: Secondary | ICD-10-CM | POA: Diagnosis not present

## 2021-12-13 DIAGNOSIS — I495 Sick sinus syndrome: Secondary | ICD-10-CM

## 2021-12-13 DIAGNOSIS — R053 Chronic cough: Secondary | ICD-10-CM

## 2021-12-13 LAB — NITRIC OXIDE: Nitric Oxide: 15

## 2021-12-13 MED ORDER — GUAIFENESIN-CODEINE 100-10 MG/5ML PO SYRP: 5.0000 mL | ORAL_SOLUTION | Freq: Three times a day (TID) | ORAL | 0 refills | Status: DC | PRN

## 2021-12-13 MED ORDER — ESOMEPRAZOLE MAGNESIUM 40 MG PO CPDR: 40.0000 mg | DELAYED_RELEASE_CAPSULE | Freq: Every day | ORAL | 3 refills | Status: DC

## 2021-12-13 NOTE — Telephone Encounter (Signed)
PA request received via CMM through [employer] for guaiFENesin-Codeine 100-10MG /5ML solution   PA not submitted due to cough suppressants being non-formulary.  Patient may have to use coupon to obtain medication.  Key: Tracy Barton

## 2021-12-13 NOTE — Telephone Encounter (Signed)
Lm for Margarito Courser, NP.

## 2021-12-13 NOTE — Telephone Encounter (Signed)
Unfortunately what she is needed right now is a cough suppressant.  There are no coupons for Robitussin AC.

## 2021-12-13 NOTE — Progress Notes (Signed)
Subjective:    Patient ID: Tracy Barton, female    DOB: 20-Sep-1945, 76 y.o.   MRN: [id number] Patient Care Team: Emogene Morgan, MD as PCP - General (Family Medicine) Salena Saner, MD as Consulting Physician (Pulmonary Disease)  Chief Complaint  Patient presents with   Follow-up    Chronic dry cough. Wheezing when she uses the inhaler.   HPI Patient is a 76 year old former smoker who presents for follow-up of a chronic cough present since pacemaker insertion in May 2023.  This is an acute visit.  Patient was initially seen here on 01 December 2021 for the details of that visit please refer to that note.  She has not had PFTs requested as of yet.  She is brought in by her daughter stating that they both cannot sleep due to the patient's coughing particularly at nighttime when she is laying down.  Daughter states that the cough is constant however of note she has not had cough during her visits.  It is of note however that she does occasionally seem to have what appears to be a "hiccup" during the visit.  At her prior evaluation it was postulated that may be the pacer leads may be causing diaphragmatic irritation.  She had a CT chest today that has not been formally evaluated by the radiologist yet and it does appear that there is significant diaphragmatic proximity of one of the leads.  On prior radiographic evaluation in January the patient was also noted to have reflux to the level of the mid chest.  The patient was given a trial of Spiriva during her prior visit however the daughter states that the inhaler causes her to wheeze.   Review of Systems A 10 point review of systems was performed and it is as noted above otherwise negative.  Patient Active Problem List   Diagnosis Date Noted   Sick sinus syndrome (HCC) 05/25/2021   History of cardioembolic cerebrovascular accident (CVA) 03/25/2021   Bradycardia    Syncope and collapse    CVA (cerebral vascular accident) (HCC)  05/08/2020   Tobacco use 05/08/2020   Syncope/ presyncope - chronic, worsening  02/01/2020   COVID-19 02/01/2020   Sinus bradycardia 02/01/2020   H pylori ulcer 01/23/2020   Acute metabolic encephalopathy 01/23/2020   Chronic alcohol abuse 01/23/2020   Hypokalemia 01/23/2020   Hypophosphatemia 01/23/2020   Acute blood loss anemia 01/23/2020   Essential hypertension 01/23/2020   Protein-calorie malnutrition, severe 01/15/2020   Perforated prepyloric gastric ulcer s/p omental Cheree Ditto patch 01/10/2020 01/10/2020   History of medication noncompliance 01/10/2020   ARF (acute renal failure) (HCC) 01/10/2020   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   Hep C w/o coma, chronic (HCC) 09/12/2014   Head injury 09/11/2014   Back injury 04/15/2014   Smoker 03/24/2013   Gastritis and gastroduodenitis 03/24/2013   Loss of weight 03/24/2013   Unspecified vitamin D deficiency 03/24/2013   Trigeminal neuralgia 06/01/2011   Social History   Tobacco Use   Smoking status: Former    Packs/day: 0.50    Years: 53.00    Total pack years: 26.50    Types: Cigarettes    Quit date: 05/2021    Years since quitting: 0.6   Smokeless tobacco: Never  Substance Use Topics   Alcohol use: Yes    Alcohol/week: 2.0 standard drinks of alcohol    Types: 2 Cans of beer per week   Allergies  Allergen Reactions   Nsaids Other (See Comments)  PERFORATED ULCER   Current Meds  Medication Sig   albuterol (VENTOLIN HFA) 108 (90 Base) MCG/ACT inhaler Inhale 1 puff into the lungs every 4 (four) hours as needed.   alendronate (FOSAMAX) 70 MG tablet Take 70 mg by mouth once a week.   atorvastatin (LIPITOR) 40 MG tablet Take 1 tablet (40 mg total) by mouth at bedtime.   celecoxib (CELEBREX) 200 MG capsule Take 1 capsule by mouth daily.   donepezil (ARICEPT) 10 MG tablet Take 10 mg by mouth at bedtime.   memantine (NAMENDA) 5 MG tablet Take 5 mg by mouth 2 (two) times daily.   sertraline (ZOLOFT) 25 MG tablet Take 25  mg by mouth daily.   thiamine (VITAMIN B1) 100 MG tablet Take by mouth. Take 1 tablet (100 mg total) by mouth once daily   Tiotropium Bromide Monohydrate (SPIRIVA RESPIMAT) 1.25 MCG/ACT AERS Inhale 2 puffs into the lungs daily.   Tiotropium Bromide Monohydrate (SPIRIVA RESPIMAT) 2.5 MCG/ACT AERS Inhale 2 puffs into the lungs daily.   traMADol (ULTRAM) 50 MG tablet Take 50 mg by mouth every 12 (twelve) hours as needed.   traZODone (DESYREL) 50 MG tablet Take 50-100 mg by mouth at bedtime as needed for sleep.   Immunization History  Administered Date(s) Administered   Fluad Quad(high Dose 65+) 10/02/2021   Influenza,inj,Quad PF,6+ Mos 09/11/2014   Pneumococcal Polysaccharide-23 03/24/2013    .  Objective:   Physical Exam BP 120/78 (BP Location: Left Arm, Cuff Size: Normal)   Pulse 74   Temp 98.1 F (36.7 C)   Ht 5' 3.5" (1.613 m)   Wt 133 lb 6.4 oz (60.5 kg)   SpO2 97%   BMI 23.26 kg/m  GENERAL: Well-developed, well-nourished woman, no acute distress.  Does not speak much. HEAD: Normocephalic, atraumatic.  EYES: Pupils equal, round, reactive to light.  No scleral icterus.  MOUTH: Nose/mouth/throat not examined due to masking requirements for COVID 19. NECK: Supple. No thyromegaly. Trachea midline. No JVD.  No adenopathy. PULMONARY: Good air entry bilaterally.  No adventitious sounds. CARDIOVASCULAR: S1 and S2. Regular rate and rhythm.  No rubs, murmurs or gallops heard. ABDOMEN: Benign. MUSCULOSKELETAL: No joint deformity, no clubbing, no edema.  NEUROLOGIC: No overt focal deficit, speech fluent when she speaks. SKIN: Intact,warm,dry. PSYCH: Flat affect.  Very passive.  Lab Results  Component Value Date   NITRICOXIDE 15 12/13/2021  No evidence of type II inflammation as prior.      Assessment & Plan:     ICD-10-CM   1. Chronic cough  R05.3 Nitric oxide   This started after her pacemaker placement Occasional "hiccuping/jerking" noted during visit Query diaphragmatic  irritation from pacemaker Also possible GERD    2. Chronic GERD  K21.9    Omeprazole 40 mg daily Rechallenge Antireflux measures    3. Sick sinus syndrome (HCC)  I49.5    Status post pacemaker placement This issue adds complexity to her management Query pacer lead irritation of the diaphragm    4. COPD suggested by initial evaluation (HCC)  J44.9    PFTs are pending Hold inhalers until PFTs known     Orders Placed This Encounter  Procedures   Nitric oxide   Meds ordered this encounter  Medications   esomeprazole (NEXIUM) 40 MG capsule    Sig: Take 1 capsule (40 mg total) by mouth daily.    Dispense:  30 capsule    Refill:  3   guaiFENesin-codeine (ROBITUSSIN AC) 100-10 MG/5ML syrup    Sig:  Take 5 mLs by mouth 3 (three) times daily as needed for cough.    Dispense:  180 mL    Refill:  0   Patient already has follow-up appointment established on 10 January.  I have recommended to the patient's daughter to reach out to [facility] cardiologist at [facility] to evaluate for the possibility of diaphragmatic irritation by the pacer lead.  Patient had CT chest performed today final interpretation by the radiologist pending on independent review there may be possibility of pacer lead diaphragmatic irritation however formal interpretation is pending.  Gailen Shelter, MD Advanced Bronchoscopy PCCM [facility]    *This note was dictated using voice recognition software/Dragon.  Despite best efforts to proofread, errors can occur which can change the meaning. Any transcriptional errors that result from this process are unintentional and may not be fully corrected at the time of dictation.

## 2021-12-13 NOTE — Patient Instructions (Addendum)
I would recommend that you stop taking the Fosamax (alendronate) for at least 3 months to see how you do.  This medication can cause severe reflux which can worsen cough.  You did not have inflammation in your airway by the test we did today.  Hold off on the inhaler for now.  We did order at your last visit some breathing tests that are necessary for me to determine what is going on with your airways.  We are putting you on a medication for reflux.  You tried this previously but only for a month and you will need to take it for at least 3 months.  I have sent in for a cough suppressant to take at nighttime for sleep however, this may or may not help your cough.  This medication will not be able to be refilled due to being on narcotic.  Hopefully will get you controlled a little bit until we can get more information of what is going on.  Ultimately, the position of the pacer wire against your diaphragm muscle needs to be checked.  This can be checked by your cardiologist stimulating the pacer to see if the cough occurs when the pacer fires.  As soon as I have the results of the chest CT we will give you a call.  We will see you in follow-up as scheduled on 10 January to see our nurse practitioner Buelah Manis.  We will make sure that your breathing tests are done before then.

## 2021-12-14 NOTE — Telephone Encounter (Signed)
Dr. Jayme Cloud spoke with [facility] white, NP tofay.

## 2021-12-14 NOTE — Telephone Encounter (Signed)
Have tried to reach out both phone numbers.

## 2021-12-14 NOTE — Telephone Encounter (Signed)
Dr. Gonzalez, please advise. Thanks 

## 2021-12-16 ENCOUNTER — Other Ambulatory Visit
Admission: RE | Admit: 2021-12-16 | Discharge: 2021-12-16 | Disposition: A | Discharge status: Home / Self Care | Payer: Medicare Other | Source: Ambulatory Visit | Attending: Student | Admitting: Student

## 2021-12-16 DIAGNOSIS — R058 Other specified cough: Secondary | ICD-10-CM | POA: Diagnosis present

## 2021-12-16 DIAGNOSIS — R0602 Shortness of breath: Secondary | ICD-10-CM | POA: Insufficient documentation

## 2021-12-16 LAB — BRAIN NATRIURETIC PEPTIDE: B Natriuretic Peptide: 37.5 pg/mL (ref 0.0–100.0)

## 2021-12-22 ENCOUNTER — Ambulatory Visit: Payer: Medicare Other | Attending: Pulmonary Disease

## 2021-12-30 ENCOUNTER — Ambulatory Visit: Payer: Medicaid Other

## 2022-01-10 ENCOUNTER — Ambulatory Visit: Payer: 59 | Attending: Pulmonary Disease

## 2022-01-10 DIAGNOSIS — R053 Chronic cough: Secondary | ICD-10-CM | POA: Diagnosis present

## 2022-01-10 LAB — PULMONARY FUNCTION TEST ARMC ONLY
DL/VA % pred: 66 %
DL/VA: 2.74 ml/min/mmHg/L
DLCO unc % pred: 42 %
DLCO unc: 7.79 ml/min/mmHg
FEF 25-75 Post: 2.04 L/sec
FEF 25-75 Pre: 1.16 L/sec
FEF2575-%Change-Post: 75 %
FEF2575-%Pred-Post: 135 %
FEF2575-%Pred-Pre: 77 %
FEV1-%Change-Post: 16 %
FEV1-%Pred-Post: 83 %
FEV1-%Pred-Pre: 71 %
FEV1-Post: 1.62 L
FEV1-Pre: 1.39 L
FEV1FVC-%Change-Post: 6 %
FEV1FVC-%Pred-Pre: 102 %
FEV6-%Change-Post: 9 %
FEV6-%Pred-Post: 80 %
FEV6-%Pred-Pre: 73 %
FEV6-Post: 2 L
FEV6-Pre: 1.83 L
FEV6FVC-%Pred-Post: 105 %
FEV6FVC-%Pred-Pre: 105 %
FVC-%Change-Post: 9 %
FVC-%Pred-Post: 76 %
FVC-%Pred-Pre: 70 %
FVC-Post: 2 L
FVC-Pre: 1.83 L
Post FEV1/FVC ratio: 81 %
Post FEV6/FVC ratio: 100 %
Pre FEV1/FVC ratio: 76 %
Pre FEV6/FVC Ratio: 100 %
RV % pred: 100 %
RV: 2.29 L
TLC % pred: 79 %
TLC: 3.92 L

## 2022-01-10 MED ORDER — ALBUTEROL SULFATE (2.5 MG/3ML) 0.083% IN NEBU
2.5000 mg | INHALATION_SOLUTION | Freq: Once | RESPIRATORY_TRACT | Status: AC
  Administered 2022-01-10: 2.5 mg via RESPIRATORY_TRACT
  Filled 2022-01-10: qty 3

## 2022-01-11 ENCOUNTER — Ambulatory Visit (INDEPENDENT_AMBULATORY_CARE_PROVIDER_SITE_OTHER): Payer: 59 | Admitting: Primary Care

## 2022-01-11 ENCOUNTER — Encounter: Payer: Self-pay | Admitting: Primary Care

## 2022-01-11 ENCOUNTER — Ambulatory Visit: Payer: Medicaid Other | Admitting: Pulmonary Disease

## 2022-01-11 VITALS — BP 110/70 | HR 89 | Temp 97.5°F | Ht 63.5 in | Wt 133.0 lb

## 2022-01-11 DIAGNOSIS — J4520 Mild intermittent asthma, uncomplicated: Secondary | ICD-10-CM | POA: Diagnosis not present

## 2022-01-11 MED ORDER — PROMETHAZINE-DM 6.25-15 MG/5ML PO SYRP: 5.0000 mL | ORAL_SOLUTION | Freq: Four times a day (QID) | ORAL | 0 refills | Status: DC | PRN

## 2022-01-11 MED ORDER — IPRATROPIUM BROMIDE 0.03 % NA SOLN: 2.0000 | Freq: Two times a day (BID) | NASAL | 12 refills | Status: DC

## 2022-01-11 MED ORDER — FLUTICASONE FUROATE-VILANTEROL 200-25 MCG/ACT IN AEPB: 1.0000 | INHALATION_SPRAY | Freq: Every day | RESPIRATORY_TRACT | 2 refills | Status: DC

## 2022-01-11 NOTE — Progress Notes (Signed)
@Patient  ID: Tracy Barton, female    DOB: 23-Sep-1945, 77 y.o.   MRN: [id number]  Chief Complaint  Patient presents with   Follow-up    Still coughing.     Referring provider: Donnie Coffin, MD  HPI: 77 year old female   [facility] pulmonary encounter: 12/13/21 Patient is a 77 year old former smoker who presents for follow-up of a chronic cough present since pacemaker insertion in May 2023.  This is an acute visit.  Patient was initially seen here on 01 December 2021 for the details of that visit please refer to that note.  She has not had PFTs requested as of yet.  She is brought in by her daughter stating that they both cannot sleep due to the patient's coughing particularly at nighttime when she is laying down.  Daughter states that the cough is constant however of note she has not had cough during her visits.  It is of note however that she does occasionally seem to have what appears to be a "hiccup" during the visit.  At her prior evaluation it was postulated that may be the pacer leads may be causing diaphragmatic irritation.  She had a CT chest today that has not been formally evaluated by the radiologist yet and it does appear that there is significant diaphragmatic proximity of one of the leads.  On prior radiographic evaluation in January the patient was also noted to have reflux to the level of the mid chest.  The patient was given a trial of Spiriva during her prior visit however the daughter states that the inhaler causes her to wheeze.  01/11/2022 Patient was seen in December by Dr. Patsey Berthold. CT chest showed no acute chest findings. Mild emphysema  Still coughing Cough is dry/np Sob with cough  FENO 15   Reflux medications- she was taking nexium but not benefit Allergy medication- not currently taking   Family history of asthma   Allergies  Allergen Reactions   Nsaids Other (See Comments)    PERFORATED ULCER    Immunization History  Administered Date(s)  Administered   Fluad Quad(high Dose 65+) 10/02/2021   Influenza,inj,Quad PF,6+ Mos 09/11/2014   Pneumococcal Polysaccharide-23 03/24/2013    Past Medical History:  Diagnosis Date   Blood transfusion without reported diagnosis    Depression    Neuromuscular disorder ([place])     Tobacco History: Social History   Tobacco Use  Smoking Status Former   Packs/day: 0.50   Years: 53.00   Total pack years: 26.50   Types: Cigarettes   Quit date: 05/2021   Years since quitting: 0.7  Smokeless Tobacco Never   Counseling given: Not Answered   Outpatient Medications Prior to Visit  Medication Sig Dispense Refill   alendronate (FOSAMAX) 70 MG tablet Take 70 mg by mouth once a week.     atorvastatin (LIPITOR) 40 MG tablet Take 1 tablet (40 mg total) by mouth at bedtime. 30 tablet 0   celecoxib (CELEBREX) 200 MG capsule Take 1 capsule by mouth daily.     donepezil (ARICEPT) 10 MG tablet Take 10 mg by mouth at bedtime.     esomeprazole (NEXIUM) 40 MG capsule Take 1 capsule (40 mg total) by mouth daily. 30 capsule 3   memantine (NAMENDA) 5 MG tablet Take 5 mg by mouth 2 (two) times daily.     sertraline (ZOLOFT) 25 MG tablet Take 25 mg by mouth daily.     thiamine (VITAMIN B1) 100 MG tablet Take by mouth.  Take 1 tablet (100 mg total) by mouth once daily     traMADol (ULTRAM) 50 MG tablet Take 50 mg by mouth every 12 (twelve) hours as needed.     traZODone (DESYREL) 50 MG tablet Take 50-100 mg by mouth at bedtime as needed for sleep.     guaiFENesin-codeine (ROBITUSSIN AC) 100-10 MG/5ML syrup Take 5 mLs by mouth 3 (three) times daily as needed for cough. 180 mL 0   albuterol (VENTOLIN HFA) 108 (90 Base) MCG/ACT inhaler Inhale 1 puff into the lungs every 4 (four) hours as needed. (Patient not taking: Reported on 01/11/2022)     Tiotropium Bromide Monohydrate (SPIRIVA RESPIMAT) 1.25 MCG/ACT AERS Inhale 2 puffs into the lungs daily. (Patient not taking: Reported on 01/11/2022) 4 g 0   Tiotropium  Bromide Monohydrate (SPIRIVA RESPIMAT) 2.5 MCG/ACT AERS Inhale 2 puffs into the lungs daily. (Patient not taking: Reported on 01/11/2022) 4 g 11   No facility-administered medications prior to visit.   Review of Systems  Review of Systems  Constitutional: Negative.   HENT:  Positive for postnasal drip.   Respiratory:  Positive for cough.     Physical Exam  BP 110/70 (BP Location: Left Arm, Cuff Size: Normal)   Pulse 89   Temp (!) 97.5 F (36.4 C)   Ht 5' 3.5" (1.613 m)   Wt 133 lb (60.3 kg) Comment: last recorded weight. Patient in a wheelchair  SpO2 98%   BMI 23.19 kg/m  Physical Exam Constitutional:      Appearance: Normal appearance.  HENT:     Mouth/Throat:     Mouth: Mucous membranes are moist.     Pharynx: Oropharynx is clear.  Cardiovascular:     Rate and Rhythm: Normal rate and regular rhythm.  Pulmonary:     Effort: Pulmonary effort is normal.     Breath sounds: Normal breath sounds. No wheezing, rhonchi or rales.  Abdominal:     Tenderness: There is no rebound.  Neurological:     General: No focal deficit present.     Mental Status: She is alert and oriented to person, place, and time. Mental status is at baseline.  Psychiatric:        Mood and Affect: Mood normal.        Behavior: Behavior normal.        Thought Content: Thought content normal.        Judgment: Judgment normal.      Lab Results:  CBC    Component Value Date/Time   WBC 9.3 03/26/2021 0549   RBC 4.39 03/26/2021 0549   HGB 11.8 (L) 03/26/2021 0549   HCT 36.6 03/26/2021 0549   PLT 272 03/26/2021 0549   MCV 83.4 03/26/2021 0549   MCV 84.8 09/11/2014 1402   MCH 26.9 03/26/2021 0549   MCHC 32.2 03/26/2021 0549   RDW 13.2 03/26/2021 0549   LYMPHSABS 3.6 05/08/2020 1623   MONOABS 0.7 05/08/2020 1623   EOSABS 0.1 05/08/2020 1623   BASOSABS 0.0 05/08/2020 1623    BMET    Component Value Date/Time   NA 137 03/26/2021 0549   K 3.6 03/26/2021 0549   CL 104 03/26/2021 0549   CO2  26 03/26/2021 0549   GLUCOSE 102 (H) 03/26/2021 0549   BUN 19 03/26/2021 0549   CREATININE 1.18 (H) 03/26/2021 0549   CREATININE 0.86 09/21/2014 1214   CALCIUM 9.2 03/26/2021 0549   GFRNONAA 48 (L) 03/26/2021 0549   GFRAA 54 (L) 03/08/2019 1548  BNP    Component Value Date/Time   BNP 37.5 12/16/2021 1114    ProBNP No results found for: "PROBNP"  Imaging: No results found.   Assessment & Plan:   Asthma Pulmonary function testing consistent with asthma  She has mild emphysema on CT imaging   Recommendations: - Start BREO 271mcg- take one puff daily in the morning and rinse mouth after using for asthma - Start Atrovent nasal spray twice daily for rhinitis/allergies (not flonase) - Start promethazine cough syrup - take 108ml every 6 hours as needed for cough suppression (may make you drowsy, do not drive or combine with alcohol or other sedating medications)  Follow-up: - 2 weeks with Dr. Darnell Level or Eustaquio Maize NP     Martyn Ehrich, NP 01/16/2022

## 2022-01-11 NOTE — Patient Instructions (Addendum)
Pulmonary function testing consistent with asthma  You also have mild emphysema on CT imaging   Recommendations: - Start BREO 275mcg- take one puff daily in the morning and rinse mouth after using for asthma - Start Atrovent nasal spray twice daily for rhinitis/allergies (not flonase) - Start promethazine cough syrup - take 81ml every 6 hours as needed for cough suppression (may make you drowsy, do not drive or combine with alcohol or other sedating medications)  Follow-up: - 2 weeks with Dr. Darnell Level or Eustaquio Maize NP

## 2022-01-16 DIAGNOSIS — J45909 Unspecified asthma, uncomplicated: Secondary | ICD-10-CM | POA: Insufficient documentation

## 2022-01-16 NOTE — Assessment & Plan Note (Signed)
Pulmonary function testing consistent with asthma  She has mild emphysema on CT imaging   Recommendations: - Start BREO 242mcg- take one puff daily in the morning and rinse mouth after using for asthma - Start Atrovent nasal spray twice daily for rhinitis/allergies (not flonase) - Start promethazine cough syrup - take 21ml every 6 hours as needed for cough suppression (may make you drowsy, do not drive or combine with alcohol or other sedating medications)  Follow-up: - 2 weeks with Dr. Darnell Level or Eustaquio Maize NP

## 2022-01-26 ENCOUNTER — Encounter: Payer: Self-pay | Admitting: Pulmonary Disease

## 2022-01-26 ENCOUNTER — Ambulatory Visit (INDEPENDENT_AMBULATORY_CARE_PROVIDER_SITE_OTHER): Payer: 59 | Admitting: Pulmonary Disease

## 2022-01-26 VITALS — BP 106/60 | HR 80 | Temp 98.1°F | Ht 63.5 in | Wt 135.6 lb

## 2022-01-26 DIAGNOSIS — K219 Gastro-esophageal reflux disease without esophagitis: Secondary | ICD-10-CM | POA: Diagnosis not present

## 2022-01-26 DIAGNOSIS — Z950 Presence of cardiac pacemaker: Secondary | ICD-10-CM | POA: Diagnosis not present

## 2022-01-26 DIAGNOSIS — R053 Chronic cough: Secondary | ICD-10-CM

## 2022-01-26 DIAGNOSIS — J4520 Mild intermittent asthma, uncomplicated: Secondary | ICD-10-CM

## 2022-01-26 DIAGNOSIS — I495 Sick sinus syndrome: Secondary | ICD-10-CM

## 2022-01-26 MED ORDER — GABAPENTIN 100 MG PO CAPS: 100.0000 mg | ORAL_CAPSULE | Freq: Every day | ORAL | 2 refills | Status: DC

## 2022-01-26 MED ORDER — GUAIFENESIN-CODEINE 100-10 MG/5ML PO SYRP: 5.0000 mL | ORAL_SOLUTION | Freq: Three times a day (TID) | ORAL | 0 refills | Status: DC | PRN

## 2022-01-26 MED ORDER — TRELEGY ELLIPTA 100-62.5-25 MCG/ACT IN AEPB: 1.0000 | INHALATION_SPRAY | Freq: Every day | RESPIRATORY_TRACT | 0 refills | Status: DC

## 2022-01-26 NOTE — Patient Instructions (Signed)
At your request we are placing in a second opinion consult with Dr. Caryl Comes who is an electrophysiologist.  I have sent in a prescription for cough medication.  You have been given samples of an inhaler called Trelegy this is 1 puff daily, make sure you rinse your mouth well after you use it.  We have also sent in a prescription for a medication called gabapentin that you will take at bedtime.  Hopefully this will also help with the cough.  We are starting this medication at a low dose.  We can increase it if it seems to be somewhat helpful.  We will see you in follow-up in 3 to 4 weeks time with either me or one of the nurse practitioners at that time.

## 2022-01-26 NOTE — Progress Notes (Signed)
Subjective:    Patient ID: Tracy Barton, female    DOB: May 30, 1945, 77 y.o.   MRN: [id number] Patient Care Team: Donnie Coffin, MD as PCP - General (Family Medicine) Tyler Pita, MD as Consulting Physician (Pulmonary Disease)  Chief Complaint  Patient presents with   Follow-up    HPI Patient is a 77 year old former smoker who presents for follow-up of a chronic cough present since pacemaker insertion in May 2023.  This is follow-up visit.  She was last seen here on 11 January 2022 by our nurse practitioner Derl Barrow. Patient was initially seen here on 01 December 2021 for the details of that visit please refer to that note.  She had PFTs performed 10 January 2022 however, she had difficulty with the test and the diffusion capacity measurement was invalid.  She did however exhibit minimal obstructive airways disease with some reversibility.  Because of this she was started on a trial of Breo 201 puff daily.  She was also started on Atrovent nasal spray for rhinitis/allergy symptoms.  She was also given Promethazine DM syrup for cough control.  She is brought in by her daughter stating that they both cannot sleep due to the patient's coughing particularly at nighttime when she is laying down. It is of note however that she does occasionally seem to have what appears to be a "hiccup"/jerking motion during the visit.  At her prior evaluation it was postulated that may be the pacer leads may be causing diaphragmatic irritation.  Prior CT chest has not shown significant misplacement of the leads.  Discontinued Breo and albuterol for reasons that are not clear.  She is on Nexium 40 mg daily for her reflux.   On prior radiographic evaluation in January the patient was also noted to have reflux to the level of the mid chest.   The patient was given a trial of Spiriva during her prior visit however the daughter states that the inhaler causes her to wheeze.  Daughter is insistent on the  second opinion with regards to the pacemaker.   Review of Systems A 10 point review of systems was performed and it is as noted above otherwise negative.  Patient Active Problem List   Diagnosis Date Noted   Asthma 01/16/2022   Sick sinus syndrome ([place]) 05/25/2021   History of cardioembolic cerebrovascular accident (CVA) 03/25/2021   Bradycardia    Syncope and collapse    CVA (cerebral vascular accident) ([place]) 05/08/2020   Tobacco use 05/08/2020   Syncope/ presyncope - chronic, worsening  02/01/2020   COVID-19 02/01/2020   Sinus bradycardia 02/01/2020   H pylori ulcer 01/04/7251   Acute metabolic encephalopathy 66/44/0347   Chronic alcohol abuse 01/23/2020   Hypokalemia 01/23/2020   Hypophosphatemia 01/23/2020   Acute blood loss anemia 01/23/2020   Essential hypertension 01/23/2020   Protein-calorie malnutrition, severe 01/15/2020   Perforated prepyloric gastric ulcer s/p omental Phillip Heal patch 01/10/2020 01/10/2020   History of medication noncompliance 01/10/2020   ARF (acute renal failure) ([place]) 01/10/2020   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   Hep C w/o coma, chronic ([place]) 09/12/2014   Head injury 09/11/2014   Back injury 04/15/2014   Smoker 03/24/2013   Gastritis and gastroduodenitis 03/24/2013   Loss of weight 03/24/2013   Unspecified vitamin D deficiency 03/24/2013   Trigeminal neuralgia 06/01/2011   Social History   Tobacco Use   Smoking status: Former    Packs/day: 0.50    Years: 53.00    Total pack  years: 26.50    Types: Cigarettes    Quit date: 05/2021    Years since quitting: 0.7   Smokeless tobacco: Never  Substance Use Topics   Alcohol use: Yes    Alcohol/week: 2.0 standard drinks of alcohol    Types: 2 Cans of beer per week   Allergies  Allergen Reactions   Nsaids Other (See Comments)    PERFORATED ULCER       Objective:   Physical Exam BP 106/60 (BP Location: Left Arm, Cuff Size: Normal)   Pulse 80   Temp 98.1 F (36.7 C) (Temporal)    Ht 5' 3.5" (1.613 m)   Wt 135 lb 9.6 oz (61.5 kg)   SpO2 98%   BMI 23.64 kg/m   SpO2: 98 % O2 Device: None (Room air)  GENERAL: Well-developed, well-nourished woman, no acute distress.  Does not speak much. HEAD: Normocephalic, atraumatic.  EYES: Pupils equal, round, reactive to light.  No scleral icterus.  MOUTH: Nose/mouth/throat not examined due to masking requirements for COVID 19. NECK: Supple. No thyromegaly. Trachea midline. No JVD.  No adenopathy. PULMONARY: Good air entry bilaterally.  Coarse, otherwiseno adventitious sounds. CARDIOVASCULAR: S1 and S2. Regular rate and rhythm.  No rubs, murmurs or gallops heard. ABDOMEN: Benign. MUSCULOSKELETAL: No joint deformity, no clubbing, no edema.  NEUROLOGIC: No overt focal deficit, speech fluent when she speaks. SKIN: Intact,warm,dry. PSYCH: Flat affect.  Very passive.      Assessment & Plan:     ICD-10-CM   1. Chronic cough  R05.3    Recommended to stay on daily inhaler Robitussin AC as needed Reevaluate pacer issue    2. Mild intermittent asthma without complication  J45.20    Trial of Trelegy Ellipta 100, 1 and a patient daily Continue as needed albuterol    3. Chronic GERD  K21.9    Continue Nexium 40 mg daily Antireflux measures    4. Pacemaker  Z95.0 Ambulatory referral to Cardiac Electrophysiology   Patient's daughter request second opinion Consultation sent to [facility]    5. Sick sinus syndrome ([facility])  I49.5    Pacemaker dependent     Orders Placed This Encounter  Procedures   Ambulatory referral to Cardiac Electrophysiology    Referral Priority:   Routine    Referral Type:   Consultation    Referral Reason:   Specialty Services Required    Referred to Provider:   Duke Salvia, MD    Requested Specialty:   Cardiology    Number of Visits Requested:   1   Meds ordered this encounter  Medications   Fluticasone-Umeclidin-Vilant (TRELEGY ELLIPTA) 100-62.5-25 MCG/ACT AEPB    Sig:  Inhale 1 puff into the lungs daily.    Dispense:  14 each    Refill:  0    Order Specific Question:   Lot Number?    Answer:   [id number]    Order Specific Question:   Expiration Date?    Answer:   06/03/2023    Order Specific Question:   Manufacturer?    Answer:   GlaxoSmithKline [[id number]]    Order Specific Question:   Quantity    Answer:   2   guaiFENesin-codeine (ROBITUSSIN AC) 100-10 MG/5ML syrup    Sig: Take 5 mLs by mouth 3 (three) times daily as needed for cough.    Dispense:  180 mL    Refill:  0   gabapentin (NEURONTIN) 100 MG capsule    Sig: Take 1 capsule (100 mg  total) by mouth at bedtime.    Dispense:  30 capsule    Refill:  2   It was reiterated that she needs to stay on an inhaler.  Daughter understands this.  Sent a second opinion request to our electrophysiology colleagues.  Will try Robitussin AC as needed for cough start low-dose gabapentin at bedtime for cough.  Follow-up in 3 to 4 weeks time call sooner should any new difficulties arise.  Renold Don, MD Advanced Bronchoscopy PCCM [facility]-    *This note was dictated using voice recognition software/Dragon.  Despite best efforts to proofread, errors can occur which can change the meaning. Any transcriptional errors that result from this process are unintentional and may not be fully corrected at the time of dictation.

## 2022-01-27 ENCOUNTER — Encounter: Payer: Self-pay | Admitting: *Deleted

## 2022-01-30 ENCOUNTER — Ambulatory Visit: Payer: 59 | Attending: Internal Medicine | Admitting: Internal Medicine

## 2022-01-30 VITALS — BP 158/88 | Ht 63.5 in | Wt 132.5 lb

## 2022-01-30 DIAGNOSIS — R001 Bradycardia, unspecified: Secondary | ICD-10-CM

## 2022-01-30 DIAGNOSIS — Z950 Presence of cardiac pacemaker: Secondary | ICD-10-CM

## 2022-01-30 NOTE — Progress Notes (Signed)
Patient Care Team: Donnie Coffin, MD as PCP - General (Family Medicine) Tyler Pita, MD as Consulting Physician (Pulmonary Disease)   HPI  Tracy Barton is a 77 y.o. female seen in consultation by Dr. Patsey Berthold because of a cough that developed apparently concurrent with pacemaker implantation and has been refractory to most therapies.  Is been a little bit better of late.  Device was implanted 5/23 for a history of syncope in the context of mild sinus bradycardia, there may have been other testing which I do not have access that was more clarifying, however, following pacing, there has been no recurrent syncope.  She has had some episodes of lightheadedness but has not seen history also orthostatic intolerance    Hx of TIA x 2, orthostatic hypotension.  No history of atrial fibrillation  The patient denies chest pain, shortness of breath, nocturnal dyspnea, orthopnea or peripheral edema.  There have been no palpitations or syncope.    Alcohol history is at least more than 77 years old  DATE TEST EF   3/23 Echo  55-60 %               Date Cr K Hgb  10/22   12.9  3/23 1.18 3.6 11.8<<12.9   12/23   11.8     Records and Results Reviewed   Past Medical History:  Diagnosis Date   Blood transfusion without reported diagnosis    Depression    Neuromuscular disorder ([place])     Past Surgical History:  Procedure Laterality Date   BRAIN SURGERY     LAPAROSCOPY N/A 01/10/2020   Procedure: LAPAROSCOPY DIAGNOSTIC laparoscopic washout omental patch splenectomy;  Surgeon: Michael Boston, MD;  Location: [facility];  Service: General;  Laterality: N/A;   LAPAROTOMY N/A 01/10/2020   Procedure: EXPLORATORY LAPAROTOMY;  Surgeon: Michael Boston, MD;  Location: [facility];  Service: General;  Laterality: N/A;   PACEMAKER IMPLANT N/A 05/25/2021   Procedure: PACEMAKER IMPLANT;  Surgeon: Isaias Cowman, MD;  Location: [place] [facility];  Service: Cardiovascular;  Laterality: N/A;    TUBAL LIGATION      Current Meds  Medication Sig   alendronate (FOSAMAX) 70 MG tablet Take 70 mg by mouth once a week.   atorvastatin (LIPITOR) 40 MG tablet Take 1 tablet (40 mg total) by mouth at bedtime.   celecoxib (CELEBREX) 200 MG capsule Take 1 capsule by mouth daily.   donepezil (ARICEPT) 10 MG tablet Take 10 mg by mouth at bedtime.   Fluticasone-Umeclidin-Vilant (TRELEGY ELLIPTA) 100-62.5-25 MCG/ACT AEPB Inhale 1 puff into the lungs daily.   gabapentin (NEURONTIN) 100 MG capsule Take 1 capsule (100 mg total) by mouth at bedtime.   memantine (NAMENDA) 5 MG tablet Take 5 mg by mouth 2 (two) times daily.   promethazine-dextromethorphan (PROMETHAZINE-DM) 6.25-15 MG/5ML syrup Take 5 mLs by mouth 4 (four) times daily as needed for cough.   sertraline (ZOLOFT) 25 MG tablet Take 25 mg by mouth daily.   thiamine (VITAMIN B1) 100 MG tablet Take by mouth. Take 1 tablet (100 mg total) by mouth once daily   traMADol (ULTRAM) 50 MG tablet Take 50 mg by mouth every 12 (twelve) hours as needed.   traZODone (DESYREL) 50 MG tablet Take 50-100 mg by mouth at bedtime as needed for sleep.    Allergies  Allergen Reactions   Nsaids Other (See Comments)    PERFORATED ULCER      Review of Systems negative  except from HPI and PMH  Physical Exam BP (!) 158/88 (BP Location: Left Arm, Patient Position: Sitting, Cuff Size: Normal)   Ht 5' 3.5" (1.613 m)   Wt 132 lb 8 oz (60.1 kg)   BMI 23.10 kg/m  Well developed and well nourished in no acute distress HENT normal E scleral and icterus clear Neck Supple JVP flat; carotids brisk and full Clear to ausculation Device pocket well healed; without hematoma or erythema.  There is no tethering Regular rate and rhythm, no murmurs gallops or rub Soft with active bowel sounds No clubbing cyanosis  Edema Alert and oriented, grossly normal motor and sensory function Skin Warm and Dry  ECG atrial pacing at 60 Intervals 20/06/42  CrCl cannot be  calculated (Patient's most recent lab result is older than the maximum 21 days allowed.).   Assessment and  Plan Cough  Syncope  Sinus node dysfunction  Pacemaker-Medtronic with retracted ventricular lead  TIA  Hypertension  Aortic atherosclerosis    Reviewed with Dr. Lin Landsman, lung images are smooth without evidence of bleeding perforation, coronal images he notes that the lead tip is entirely within the context of the pericardium, and definitely not in any image into the lungs themselves.  Hence, it is unlikely that the cough is secondary to the paced maker.  I will ask my colleagues in cardiovascular imaging if there is any tool that might allow [place] to see this with greater clarity i.e. gated CT or TEE.  No recurrent syncope following pacemaker implantation.  Has a history of orthostatic hypotension but with resolution of very frequent syncope following pacing it seems that sinus node dysfunction was in fact the cause.  Blood pressure was elevated.,  I did not notice this while she was here--and because of logistical issues, we did not recheck it.  Today's blood pressure elevation is an anomaly, the last 3 recordings are 106--120.  And will need to be confirmed  Spoke with Dr Loney Laurence-- agrees with Dr Orlean Patten but suggests echo may be of some value.TTE and if unrevealing would pursue TEE. But the most definitive gated CT with contrast. (CT heart-gated-contrast) >> specifiy atrial lead perforation   Further discussions with Dr Loney Laurence and [facility]  concern also of ventricular lead perforation          Current medicines are reviewed at length with the patient today .  The patient does not  have concerns regarding medicines.

## 2022-01-30 NOTE — Patient Instructions (Signed)
Medication Instructions:  Your physician recommends that you continue on your current medications as directed. Please refer to the Current Medication list given to you today.  *If you need a refill on your cardiac medications before your next appointment, please call your pharmacy*   Lab Work: None ordered If you have labs (blood work) drawn today and your tests are completely normal, you will receive your results only by: [place] (if you have MyChart) OR A paper copy in the mail If you have any lab test that is abnormal or we need to change your treatment, we will call you to review the results.   Testing/Procedures: None ordered   Follow-Up: At [facility], you and your health needs are our priority.  As part of our continuing mission to provide you with exceptional heart care, we have created designated Provider Care Teams.  These Care Teams include your primary Cardiologist (physician) and Advanced Practice Providers (APPs -  Physician Assistants and Nurse Practitioners) who all work together to provide you with the care you need, when you need it.  We recommend signing up for the patient portal called "MyChart".  Sign up information is provided on this After Visit Summary.  MyChart is used to connect with patients for Virtual Visits (Telemedicine).  Patients are able to view lab/test results, encounter notes, upcoming appointments, etc.  Non-urgent messages can be sent to your provider as well.   To learn more about what you can do with MyChart, go to [email or web address].    Your next appointment:   Pending Dr. Caryl Comes discussing with radiology  Provider:   You may see  Dr. Virl Axe

## 2022-01-31 ENCOUNTER — Telehealth: Payer: Self-pay | Admitting: Internal Medicine

## 2022-01-31 DIAGNOSIS — Z01812 Encounter for preprocedural laboratory examination: Secondary | ICD-10-CM

## 2022-01-31 DIAGNOSIS — T829XXA Unspecified complication of cardiac and vascular prosthetic device, implant and graft, initial encounter: Secondary | ICD-10-CM

## 2022-01-31 NOTE — Telephone Encounter (Signed)
The patient was seen in the office by Dr. Caryl Comes on 01/30/22.  Per Secure Chat messages received today:  Dr. Caryl Comes: Nira Conn, I spoke to Antony Haste about this lady, and his recommendation was a gated cardiac CT with contrast please thank you very much   Questioned MD if this was like a regular Cardiac CT and what diagnosis to use.  Dr. Caryl Comes: Malvin Johns. Lead perforation (diagnosis). Yes Gayla Doss told us how to order it Cardiac CT gated w contrast and in the notes looking for lead perforation. Thx

## 2022-02-02 ENCOUNTER — Telehealth: Payer: Self-pay

## 2022-02-02 ENCOUNTER — Other Ambulatory Visit (HOSPITAL_COMMUNITY): Payer: Self-pay

## 2022-02-02 NOTE — Telephone Encounter (Signed)
Spoke to patient's daughter, Brownyn(DPR) and relayed below message. She voiced her understanding. Nothing further needed.

## 2022-02-02 NOTE — Telephone Encounter (Signed)
Patient Advocate Encounter  Received a fax from [employer] Part D regarding Prior Authorization for guaiFENesin-Codeine 100-10MG /5ML solution.   Authorization has been DENIED due to patient's formulary does not cover OTC products.

## 2022-02-03 ENCOUNTER — Encounter: Payer: Self-pay | Admitting: Pulmonary Disease

## 2022-02-08 ENCOUNTER — Other Ambulatory Visit: Payer: Self-pay | Admitting: Pulmonary Disease

## 2022-02-08 NOTE — Telephone Encounter (Signed)
Dr. Patsey Berthold, please advise on refill. Last prescribed 01/26/2022 with 133ml.

## 2022-02-08 NOTE — Telephone Encounter (Signed)
Too early to refill.  Can adjust gabapentin dose to see how she does.

## 2022-02-08 NOTE — Telephone Encounter (Signed)
Patient's daughter, Guillermina [place]) is aware of below message and voiced her understanding.  She would like for patient to continue Gabapentin as is for now and call back to adjust dosage if cough worsens.  Nothing further needed.

## 2022-02-16 NOTE — Telephone Encounter (Signed)
After further review with the Cardiac CT nurse navigators and Dr. Caryl Comes, the plan is to schedule this patient for a CT Lead Extraction protocol.   I have called and spoken with the patient's daughter, Maryan Char (ok per DPR), who is the patient's primary contact. I have advised her of the above mentioned test that is needed.  I have also advised her that I will order the patient's test today, this will push a notification to scheduling and for precert.  She is aware that I will clarify which medication/ dose of pre-medication that is needed and call her back.  I will also type up the patient's detailed instructions and have these mailed to her. Per Berton Bon, the mailing address on file for the patient is correct.   [place] voices understanding of the above and is agreeable.  She was very appreciative of the call back.

## 2022-02-21 ENCOUNTER — Encounter: Payer: Self-pay | Admitting: *Deleted

## 2022-02-21 NOTE — Telephone Encounter (Signed)
I called and spoke with the patient's daughter, [name] (ok per DPR). I have reviewed the patient's Cardiac CT pre-procedure instructions as stated below.  Tracy Barton voices understanding of these instructions and is agreeable.  The patient is coming to see [facility] on Friday 2/23 and will have her lab work done at that time at [facility].   I have also advised I will mail a copy of her instructions as well as place a copy at our front desk for pick up.  Tracy Barton was very appreciative of the call today.   Her HR was not logged from her office visit in EPIC, but her EKG showed paced at 60 bpm,

## 2022-02-21 NOTE — Telephone Encounter (Signed)
Your cardiac CT is scheduled at:   Wednesday 03/01/22 at 11:00 am (you will need to arrive at 10:30 am)   [facility] [place], [place] [id number] [phone]  You will need lab work done prior to your CT Scan- (Basic Metabolic Panel)   [place] Entrance at [facility] 1st desk on the right to check in (REGISTRATION)  Lab hours: Monday- Friday (7:30 am- 5:30 pm)   If scheduled at [facility], please arrive at the [facility] (Entrance C2) of [facility] 30 minutes prior to test start time. You can use the FREE valet parking offered at entrance C (encouraged to control the heart rate for the test)  Proceed to the [facility] Radiology Department (first floor) to check-in and test prep.  All radiology patients and guests should use entrance C2 at [facility], accessed from [facility], even though the hospital's physical address listed is [address]    Please follow these instructions carefully (unless otherwise directed):   On the Night Before the Test: Be sure to Drink plenty of water. Do not consume any caffeinated/decaffeinated beverages or chocolate 12 hours prior to your test. Do not take any antihistamines 12 hours prior to your test.   On the Day of the Test: Drink plenty of water until 1 hour prior to the test. Do not eat any food 1 hour prior to test. You may take your regular medications prior to the test.  FEMALES- please wear underwire-free bra if available, avoid dresses & tight clothing       After the Test: Drink plenty of water. After receiving IV contrast, you may experience a mild flushed feeling. This is normal. On occasion, you may experience a mild rash up to 24 hours after the test. This is not dangerous. If this occurs, you can take Benadryl 25 mg and increase your fluid intake. If you experience trouble breathing, this can be serious. If it is severe call 911  IMMEDIATELY. If it is mild, please call our office.   For non-scheduling related questions, please contact the cardiac imaging nurse navigator should you have any questions/concerns: Marchia Bond, Cardiac Imaging Nurse Navigator Gordy Clement, Cardiac Imaging Nurse Navigator [facility] Direct Office Dial: [phone]    For scheduling needs, including cancellations and rescheduling, please call [place], [phone].

## 2022-02-21 NOTE — Telephone Encounter (Signed)
Benn Moulder, RN She is all set and scheduled for 2/28 at 11:00.       Benn Moulder, RN She will also need labs for that ct scan I seen she coming to [place]  on Friday. Maybe she can get them done then.

## 2022-02-24 ENCOUNTER — Encounter: Payer: Self-pay | Admitting: Adult Health

## 2022-02-24 ENCOUNTER — Other Ambulatory Visit
Admission: RE | Admit: 2022-02-24 | Discharge: 2022-02-24 | Disposition: A | Discharge status: Home / Self Care | Payer: 59 | Attending: Internal Medicine | Admitting: Internal Medicine

## 2022-02-24 ENCOUNTER — Ambulatory Visit (INDEPENDENT_AMBULATORY_CARE_PROVIDER_SITE_OTHER): Payer: 59 | Admitting: Adult Health

## 2022-02-24 VITALS — BP 124/78 | HR 83 | Temp 97.3°F | Ht 63.5 in | Wt 138.0 lb

## 2022-02-24 DIAGNOSIS — T829XXA Unspecified complication of cardiac and vascular prosthetic device, implant and graft, initial encounter: Secondary | ICD-10-CM | POA: Insufficient documentation

## 2022-02-24 DIAGNOSIS — Z01812 Encounter for preprocedural laboratory examination: Secondary | ICD-10-CM | POA: Diagnosis present

## 2022-02-24 DIAGNOSIS — R053 Chronic cough: Secondary | ICD-10-CM

## 2022-02-24 DIAGNOSIS — J4520 Mild intermittent asthma, uncomplicated: Secondary | ICD-10-CM | POA: Diagnosis not present

## 2022-02-24 LAB — BASIC METABOLIC PANEL
Anion gap: 9 (ref 5–15)
BUN: 11 mg/dL (ref 8–23)
CO2: 21 mmol/L — ABNORMAL LOW (ref 22–32)
Calcium: 8.5 mg/dL — ABNORMAL LOW (ref 8.9–10.3)
Chloride: 110 mmol/L (ref 98–111)
Creatinine, Ser: 1.03 mg/dL — ABNORMAL HIGH (ref 0.44–1.00)
GFR, Estimated: 56 mL/min — ABNORMAL LOW (ref >60)
Glucose, Bld: 111 mg/dL — ABNORMAL HIGH (ref 70–99)
Potassium: 3.2 mmol/L — ABNORMAL LOW (ref 3.5–5.1)
Sodium: 140 mmol/L (ref 135–145)

## 2022-02-24 MED ORDER — PREDNISONE 20 MG PO TABS: 20.0000 mg | ORAL_TABLET | Freq: Every day | ORAL | 0 refills | Status: DC

## 2022-02-24 MED ORDER — BENZONATATE 200 MG PO CAPS: 200.0000 mg | ORAL_CAPSULE | Freq: Three times a day (TID) | ORAL | 1 refills | Status: DC | PRN

## 2022-02-24 MED ORDER — ALBUTEROL SULFATE HFA 108 (90 BASE) MCG/ACT IN AERS: 1.0000 | INHALATION_SPRAY | Freq: Four times a day (QID) | RESPIRATORY_TRACT | 2 refills | Status: AC | PRN

## 2022-02-24 NOTE — Progress Notes (Addendum)
$'@Patient'w$  ID: Tracy Barton, female    DOB: August 02, 1945, 77 y.o.   MRN: [id number]  Chief Complaint  Patient presents with   Follow-up    Referring provider: Donnie Coffin, MD  HPI: 77 year old female former smoker quit in 2023 seen for pulmonary consult December 01, 2021 for cough x 6 months Medical history significant for dementia, symptomatic bradycardia status post pacemaker May 2023   TEST/EVENTS :  PFTs January 10, 2022 showed airflow reversibility-FEV1 83%, ratio 81, FVC 76%, positive bronchodilator response, DLCO 42%.  CT chest December 13, 2021 showed mild emphysema with no suspicious pulmonary nodules.  A 5 mm perifissural nodule in the right major fissure with benign appearance.,  Small pericardial effusion   02/24/2022 Follow up : Chronic cough  Patient presents for a 1 month follow-up.  Patient was seen in November for a chronic cough over the last 6 months.  Cough started after a pacemaker was placed in May 2023.  Patient complains of ongoing daily cough that is mainly productive.  Has associated postnasal drainage nasal stuffiness.  Has been tried on Spiriva, Breo and Trelegy.  Without any perceived benefit.  Says that she feels that the inhalers do not help and may be caused her to wheeze.  PFTs were done last visit that showed significant bronchodilator response with reversibility. Patient has used codeine and hydrocodone cough syrups which she says does help her the most.  Patient denies any fever, discolored mucus, edema, chest pain. She is accompanied by her daughter today. Has recently seen cardiology  Dr. Caryl Comes for second opinion regarding pacemaker leads evaluation -as concern for cause of cough. Has been set up for contrasted CT Lead evaluation next week.   Allergies  Allergen Reactions   Nsaids Other (See Comments)    PERFORATED ULCER    Immunization History  Administered Date(s) Administered   Fluad Quad(high Dose 65+) 10/02/2021   Influenza,inj,Quad  PF,6+ Mos 09/11/2014   Pneumococcal Polysaccharide-23 03/24/2013    Past Medical History:  Diagnosis Date   Blood transfusion without reported diagnosis    Depression    Neuromuscular disorder ([place])     Tobacco History: Social History   Tobacco Use  Smoking Status Former   Packs/day: 0.50   Years: 53.00   Total pack years: 26.50   Types: Cigarettes   Quit date: 05/2021   Years since quitting: 0.8  Smokeless Tobacco Never   Counseling given: Not Answered   Outpatient Medications Prior to Visit  Medication Sig Dispense Refill   alendronate (FOSAMAX) 70 MG tablet Take 70 mg by mouth once a week.     atorvastatin (LIPITOR) 40 MG tablet Take 1 tablet (40 mg total) by mouth at bedtime. 30 tablet 0   donepezil (ARICEPT) 10 MG tablet Take 10 mg by mouth at bedtime.     gabapentin (NEURONTIN) 100 MG capsule Take 1 capsule (100 mg total) by mouth at bedtime. 30 capsule 2   memantine (NAMENDA) 5 MG tablet Take 5 mg by mouth 2 (two) times daily.     promethazine-dextromethorphan (PROMETHAZINE-DM) 6.25-15 MG/5ML syrup Take 5 mLs by mouth 4 (four) times daily as needed for cough. 240 mL 0   sertraline (ZOLOFT) 25 MG tablet Take 25 mg by mouth daily.     thiamine (VITAMIN B1) 100 MG tablet Take by mouth. Take 1 tablet (100 mg total) by mouth once daily     traMADol (ULTRAM) 50 MG tablet Take 50 mg by mouth every 12 (twelve) hours as  needed.     traZODone (DESYREL) 50 MG tablet Take 50-100 mg by mouth at bedtime as needed for sleep.     celecoxib (CELEBREX) 200 MG capsule Take 1 capsule by mouth daily. (Patient not taking: Reported on 02/24/2022)     esomeprazole (NEXIUM) 40 MG capsule Take 1 capsule (40 mg total) by mouth daily. (Patient not taking: Reported on 01/30/2022) 30 capsule 3   Fluticasone-Umeclidin-Vilant (TRELEGY ELLIPTA) 100-62.5-25 MCG/ACT AEPB Inhale 1 puff into the lungs daily. (Patient not taking: Reported on 02/24/2022) 14 each 0   guaiFENesin-codeine (ROBITUSSIN AC) 100-10  MG/5ML syrup Take 5 mLs by mouth 3 (three) times daily as needed for cough. (Patient not taking: Reported on 01/30/2022) 180 mL 0   ipratropium (ATROVENT) 0.03 % nasal spray Place 2 sprays into both nostrils every 12 (twelve) hours. (Patient not taking: Reported on 01/30/2022) 30 mL 12   albuterol (VENTOLIN HFA) 108 (90 Base) MCG/ACT inhaler Inhale 1 puff into the lungs every 4 (four) hours as needed. (Patient not taking: Reported on 01/26/2022)     No facility-administered medications prior to visit.     Review of Systems:   Constitutional:   No  weight loss, night sweats,  Fevers, chills,  +fatigue, or  lassitude.  HEENT:   No headaches,  Difficulty swallowing,  Tooth/dental problems, or  Sore throat,                No sneezing, itching, ear ache,  +nasal congestion, post nasal drip,   CV:  No chest pain,  Orthopnea, PND, swelling in lower extremities, anasarca, dizziness, palpitations, syncope.   GI  No heartburn, indigestion, abdominal pain, nausea, vomiting, diarrhea, change in bowel habits, loss of appetite, bloody stools.   Resp: .  No chest wall deformity  Skin: no rash or lesions.  GU: no dysuria, change in color of urine, no urgency or frequency.  No flank pain, no hematuria   MS:  No joint pain or swelling.  No decreased range of motion.  No back pain.    Physical Exam  BP 124/78 (BP Location: Left Arm, Cuff Size: Normal)   Pulse 83   Temp (!) 97.3 F (36.3 C)   Ht 5' 3.5" (1.613 m)   Wt 138 lb (62.6 kg)   SpO2 97%   BMI 24.06 kg/m   GEN: A/Ox3; pleasant , NAD, well nourished    HEENT:  [place]/AT,  NOSE-clear, THROAT-clear, no lesions, no postnasal drip or exudate noted.   NECK:  Supple w/ fair ROM; no JVD; normal carotid impulses w/o bruits; no thyromegaly or nodules palpated; no lymphadenopathy.    RESP  Clear  P & A; w/o, wheezes/ rales/ or rhonchi. no accessory muscle use, no dullness to percussion  CARD:  RRR, no m/r/g, no peripheral edema, pulses intact,  no cyanosis or clubbing.  GI:   Soft & nt; nml bowel sounds; no organomegaly or masses detected.   Musco: Warm bil, no deformities or joint swelling noted.   Neuro: alert, no focal deficits noted.    Skin: Warm, no lesions or rashes    Lab Results:      BNP   ProBNP No results found for: "PROBNP"  Imaging: No results found.  albuterol (PROVENTIL) (2.5 MG/3ML) 0.083% nebulizer solution 2.5 mg     Date Action Dose Route User   01/10/2022 0924 Given 2.5 mg Nebulization Marlane Mingle, RRT          Latest Ref Rng & Units 01/10/2022  9:59 AM  PFT Results  FVC-Pre L 1.83   FVC-Predicted Pre % 70   FVC-Post L 2.00   FVC-Predicted Post % 76   Pre FEV1/FVC % % 76   Post FEV1/FCV % % 81   FEV1-Pre L 1.39   FEV1-Predicted Pre % 71   FEV1-Post L 1.62   DLCO uncorrected ml/min/mmHg 7.79   DLCO UNC% % 42   DLVA Predicted % 66   TLC L 3.92   TLC % Predicted % 79   RV % Predicted % 100     Lab Results  Component Value Date   NITRICOXIDE 15 12/13/2021        Assessment & Plan:   Asthma Emphysema noted on CT.  If PFTs consistent with asthma.  Unfortunately patient has had no perceived benefit with Breo, Trelegy or even Spiriva.  Advised that she could try albuterol inhaler.  Have sent prescription to the pharmacy.  Very unclear if patient is taking any of these on a consistent basis.  Will treat for triggers such as postnasal drainage.  Plan  Patient Instructions  Albuterol inhaler 1-2 puffs every 4-6hr as needed  Begin Delsym 2 tsp Twice daily  for cough As needed   Begin Tesslaon Three times a day  for cough As needed   Begin Prednisone '20mg'$  daily for 5 days, take with food.  Begin zyrtec '10mg'$  At bedtime  for drainage  Saline nasal spray Twice daily   Atrovent nasal spray As needed for nasal drainage  Use caution with sedating medications  Follow up with Dr. Patsey Berthold in 2-3 months and As needed   Please contact office for sooner follow up if  symptoms do not improve or worsen or seek emergency care        Chronic cough Chronic cough questionable etiology.  Workup has shown PFTs consistent with asthma.  CT chest showed some mild emphysema with no suspicious nodules or scarring .  Will treat with a short course of steroids.  Control for cough triggers such as postnasal drainage.  And cough control regimen with Delsym and Tessalon.  Hold on additional narcotic cough syrups.  Patient education on using caution with sedating medications.  Plan  Patient Instructions  Albuterol inhaler 1-2 puffs every 4-6hr as needed  Begin Delsym 2 tsp Twice daily  for cough As needed   Begin Tesslaon Three times a day  for cough As needed   Begin Prednisone '20mg'$  daily for 5 days, take with food.  Begin zyrtec '10mg'$  At bedtime  for drainage  Saline nasal spray Twice daily   Atrovent nasal spray As needed for nasal drainage  Use caution with sedating medications  Follow up with Dr. Patsey Berthold in 2-3 months and As needed   Please contact office for sooner follow up if symptoms do not improve or worsen or seek emergency care          Rexene Edison, NP 02/24/2022

## 2022-02-24 NOTE — Assessment & Plan Note (Signed)
Emphysema noted on CT.  If PFTs consistent with asthma.  Unfortunately patient has had no perceived benefit with Breo, Trelegy or even Spiriva.  Advised that she could try albuterol inhaler.  Have sent prescription to the pharmacy.  Very unclear if patient is taking any of these on a consistent basis.  Will treat for triggers such as postnasal drainage.  Plan  Patient Instructions  Albuterol inhaler 1-2 puffs every 4-6hr as needed  Begin Delsym 2 tsp Twice daily  for cough As needed   Begin Tesslaon Three times a day  for cough As needed   Begin Prednisone '20mg'$  daily for 5 days, take with food.  Begin zyrtec '10mg'$  At bedtime  for drainage  Saline nasal spray Twice daily   Atrovent nasal spray As needed for nasal drainage  Use caution with sedating medications  Follow up with Dr. Patsey Berthold in 2-3 months and As needed   Please contact office for sooner follow up if symptoms do not improve or worsen or seek emergency care

## 2022-02-24 NOTE — Assessment & Plan Note (Signed)
Chronic cough questionable etiology.  Workup has shown PFTs consistent with asthma.  CT chest showed some mild emphysema with no suspicious nodules or scarring .  Will treat with a short course of steroids.  Control for cough triggers such as postnasal drainage.  And cough control regimen with Delsym and Tessalon.  Hold on additional narcotic cough syrups.  Patient education on using caution with sedating medications.  Plan  Patient Instructions  Albuterol inhaler 1-2 puffs every 4-6hr as needed  Begin Delsym 2 tsp Twice daily  for cough As needed   Begin Tesslaon Three times a day  for cough As needed   Begin Prednisone '20mg'$  daily for 5 days, take with food.  Begin zyrtec '10mg'$  At bedtime  for drainage  Saline nasal spray Twice daily   Atrovent nasal spray As needed for nasal drainage  Use caution with sedating medications  Follow up with Dr. Patsey Berthold in 2-3 months and As needed   Please contact office for sooner follow up if symptoms do not improve or worsen or seek emergency care

## 2022-02-24 NOTE — Patient Instructions (Addendum)
Albuterol inhaler 1-2 puffs every 4-6hr as needed  Begin Delsym 2 tsp Twice daily  for cough As needed   Begin Tesslaon Three times a day  for cough As needed   Begin Prednisone '20mg'$  daily for 5 days, take with food.  Begin zyrtec '10mg'$  At bedtime  for drainage  Saline nasal spray Twice daily   Atrovent nasal spray As needed for nasal drainage  Use caution with sedating medications  Follow up with Dr. Patsey Berthold in 2-3 months and As needed   Please contact office for sooner follow up if symptoms do not improve or worsen or seek emergency care

## 2022-02-27 NOTE — Progress Notes (Signed)
Agree with the details of the visit as noted by Tammy Parrett, NP.  C. Laura Shakeena Kafer, MD [facility] 

## 2022-02-28 ENCOUNTER — Telehealth (HOSPITAL_COMMUNITY): Payer: Self-pay | Admitting: Emergency Medicine

## 2022-02-28 NOTE — Telephone Encounter (Signed)
Reaching out to patient to offer assistance regarding upcoming cardiac imaging study; pt verbalizes understanding of appt date/time, parking situation and where to check in, pre-test NPO status and medications ordered, and verified current allergies; name and call back number provided for further questions should they arise Marchia Bond RN Navigator Cardiac Imaging [facility] [phone] office [phone] cell  Rolling veins Arrival 1030 WC entrance accompanied by daughter Aware contrast

## 2022-03-01 ENCOUNTER — Other Ambulatory Visit: Payer: Self-pay

## 2022-03-01 ENCOUNTER — Ambulatory Visit (HOSPITAL_COMMUNITY)
Admission: RE | Admit: 2022-03-01 | Discharge: 2022-03-01 | Disposition: A | Discharge status: Home / Self Care | Payer: 59 | Source: Ambulatory Visit | Attending: Internal Medicine | Admitting: Internal Medicine

## 2022-03-01 DIAGNOSIS — T829XXA Unspecified complication of cardiac and vascular prosthetic device, implant and graft, initial encounter: Secondary | ICD-10-CM | POA: Diagnosis present

## 2022-03-01 DIAGNOSIS — I700 Atherosclerosis of aorta: Secondary | ICD-10-CM | POA: Diagnosis not present

## 2022-03-01 DIAGNOSIS — I281 Aneurysm of pulmonary artery: Secondary | ICD-10-CM | POA: Insufficient documentation

## 2022-03-01 DIAGNOSIS — Z79899 Other long term (current) drug therapy: Secondary | ICD-10-CM

## 2022-03-01 DIAGNOSIS — I2510 Atherosclerotic heart disease of native coronary artery without angina pectoris: Secondary | ICD-10-CM | POA: Insufficient documentation

## 2022-03-01 DIAGNOSIS — I272 Pulmonary hypertension, unspecified: Secondary | ICD-10-CM | POA: Diagnosis not present

## 2022-03-01 MED ORDER — IOHEXOL 350 MG/ML SOLN
75.0000 mL | Freq: Once | INTRAVENOUS | Status: AC | PRN
  Administered 2022-03-01: 75 mL via INTRAVENOUS

## 2022-03-01 MED ORDER — POTASSIUM CHLORIDE ER 10 MEQ PO TBCR: 10.0000 meq | EXTENDED_RELEASE_TABLET | Freq: Every day | ORAL | 3 refills | Status: DC

## 2022-03-06 ENCOUNTER — Emergency Department
Admission: EM | Admit: 2022-03-06 | Discharge: 2022-03-06 | Disposition: A | Discharge status: Home / Self Care | Payer: 59 | Attending: Emergency Medicine | Admitting: Emergency Medicine

## 2022-03-06 ENCOUNTER — Emergency Department: Payer: 59

## 2022-03-06 DIAGNOSIS — R1110 Vomiting, unspecified: Secondary | ICD-10-CM | POA: Diagnosis present

## 2022-03-06 DIAGNOSIS — Z8673 Personal history of transient ischemic attack (TIA), and cerebral infarction without residual deficits: Secondary | ICD-10-CM | POA: Diagnosis not present

## 2022-03-06 DIAGNOSIS — R531 Weakness: Secondary | ICD-10-CM | POA: Insufficient documentation

## 2022-03-06 DIAGNOSIS — E876 Hypokalemia: Secondary | ICD-10-CM | POA: Diagnosis not present

## 2022-03-06 LAB — BASIC METABOLIC PANEL
Anion gap: 10 (ref 5–15)
BUN: 12 mg/dL (ref 8–23)
CO2: 25 mmol/L (ref 22–32)
Calcium: 8.8 mg/dL — ABNORMAL LOW (ref 8.9–10.3)
Chloride: 104 mmol/L (ref 98–111)
Creatinine, Ser: 1.11 mg/dL — ABNORMAL HIGH (ref 0.44–1.00)
GFR, Estimated: 51 mL/min — ABNORMAL LOW (ref >60)
Glucose, Bld: 120 mg/dL — ABNORMAL HIGH (ref 70–99)
Potassium: 2.7 mmol/L — CL (ref 3.5–5.1)
Sodium: 139 mmol/L (ref 135–145)

## 2022-03-06 LAB — CBC
HCT: 41.3 % (ref 36.0–46.0)
Hemoglobin: 13.4 g/dL (ref 12.0–15.0)
MCH: 27 pg (ref 26.0–34.0)
MCHC: 32.4 g/dL (ref 30.0–36.0)
MCV: 83.3 fL (ref 80.0–100.0)
Platelets: 342 10*3/uL (ref 150–400)
RBC: 4.96 MIL/uL (ref 3.87–5.11)
RDW: 14.2 % (ref 11.5–15.5)
WBC: 10.7 10*3/uL — ABNORMAL HIGH (ref 4.0–10.5)
nRBC: 0 % (ref 0.0–0.2)

## 2022-03-06 LAB — TROPONIN I (HIGH SENSITIVITY): Troponin I (High Sensitivity): 7 ng/L (ref <18)

## 2022-03-06 LAB — POTASSIUM: Potassium: 3.5 mmol/L (ref 3.5–5.1)

## 2022-03-06 LAB — MAGNESIUM: Magnesium: 2 mg/dL (ref 1.7–2.4)

## 2022-03-06 MED ORDER — ONDANSETRON HCL 4 MG/2ML IJ SOLN: 4.0000 mg | Freq: Once | INTRAMUSCULAR | Status: DC

## 2022-03-06 MED ORDER — POTASSIUM CHLORIDE CRYS ER 20 MEQ PO TBCR
40.0000 meq | EXTENDED_RELEASE_TABLET | Freq: Once | ORAL | Status: AC
  Administered 2022-03-06: 40 meq via ORAL
  Filled 2022-03-06: qty 2

## 2022-03-06 MED ORDER — SODIUM CHLORIDE 0.9 % IV BOLUS
1000.0000 mL | Freq: Once | INTRAVENOUS | Status: AC
  Administered 2022-03-06: 1000 mL via INTRAVENOUS

## 2022-03-06 MED ORDER — POTASSIUM CHLORIDE 10 MEQ/100ML IV SOLN
10.0000 meq | INTRAVENOUS | Status: AC
  Administered 2022-03-06 (×3): 10 meq via INTRAVENOUS
  Filled 2022-03-06 (×3): qty 100

## 2022-03-06 MED ORDER — MAGNESIUM SULFATE 2 GM/50ML IV SOLN
2.0000 g | Freq: Once | INTRAVENOUS | Status: AC
  Administered 2022-03-06: 2 g via INTRAVENOUS
  Filled 2022-03-06: qty 50

## 2022-03-06 NOTE — ED Triage Notes (Signed)
Pt presents to the ED via [facility] due to vomiting and weakness. Pt states it just happened about 61mns ago. Pt states she was fine this morning. Pt denies any new intake. Pt denies ETOH. Pt denies urinary symptoms. Pt  A&Ox4

## 2022-03-06 NOTE — ED Provider Notes (Signed)
[facility] Provider Note  Patient Contact: 5:31 PM (approximate)   History   Weakness and Emesis   HPI  Tracy Barton is a 77 y.o. female with a history of sick sinus syndrome, neuromuscular disorder, prior CVA and syncope, presents to the emergency department after patient had an acute episode of vomiting.  Patient states that prior to vomiting, she felt lightheaded, like she was going to pass out.  I phoned patient's daughter who states that patient had frequent syncope before having pacemaker but has not had an episode of syncope since.  Patient reports no episodes of vomiting prior to today.  No diarrhea.  She states that she has had a sporadic cough.  She denies chest pain or chest tightness.  She states that she does have a mild right-sided headache.  No numbness or tingling in the upper and lower extremities.  No acute weakness.      Physical Exam   Triage Vital Signs: ED Triage Vitals  Enc Vitals Group     BP 03/06/22 1607 (!) 123/110     Pulse Rate 03/06/22 1607 75     Resp 03/06/22 1607 18     Temp 03/06/22 1607 98.8 F (37.1 C)     Temp Source 03/06/22 1607 Oral     SpO2 03/06/22 1607 97 %     Weight 03/06/22 1608 137 lb 12.6 oz (62.5 kg)     Height 03/06/22 1608 '5\' 3"'$  (1.6 m)     Head Circumference --      Peak Flow --      Pain Score 03/06/22 1608 0     Pain Loc --      Pain Edu? --      Excl. in [place]? --     Most recent vital signs: Vitals:   03/06/22 1607 03/06/22 2057  BP: (!) 123/110 (!) 171/79  Pulse: 75 60  Resp: 18 17  Temp: 98.8 F (37.1 C) 98.4 F (36.9 C)  SpO2: 97% 99%     General: Alert and in no acute distress. Eyes:  PERRL. EOMI. Head: No acute traumatic findings ENT:      Nose: No congestion/rhinnorhea.      Mouth/Throat: Mucous membranes are moist.  Neck: No stridor. No cervical spine tenderness to palpation. Cardiovascular:  Good peripheral perfusion Respiratory: Normal respiratory effort without  tachypnea or retractions. Lungs CTAB. Good air entry to the bases with no decreased or absent breath sounds. Gastrointestinal: Bowel sounds 4 quadrants. Soft and nontender to palpation. No guarding or rigidity. No palpable masses. No distention. No CVA tenderness. Musculoskeletal: Full range of motion to all extremities.  Neurologic:  No gross focal neurologic deficits are appreciated.  Skin:   No rash noted    ED Results / Procedures / Treatments   Labs (all labs ordered are listed, but only abnormal results are displayed) Labs Reviewed  BASIC METABOLIC PANEL - Abnormal; Notable for the following components:      Result Value   Potassium 2.7 (*)    Glucose, Bld 120 (*)    Creatinine, Ser 1.11 (*)    Calcium 8.8 (*)    GFR, Estimated 51 (*)    All other components within normal limits  CBC - Abnormal; Notable for the following components:   WBC 10.7 (*)    All other components within normal limits  MAGNESIUM  POTASSIUM  CBG MONITORING, ED  TROPONIN I (HIGH SENSITIVITY)     EKG  Atrial paced  rhythm with T wave inversion in the inferior leads.  No ST segment elevation or other apparent arrhythmia.   RADIOLOGY  I personally viewed and evaluated these images as part of my medical decision making, as well as reviewing the written report by the radiologist.  ED Provider Interpretation: CT head shows no acute abnormality.   PROCEDURES:  Critical Care performed: No  Procedures   MEDICATIONS ORDERED IN ED: Medications  sodium chloride 0.9 % bolus 1,000 mL (0 mLs Intravenous Stopped 03/06/22 2255)  potassium chloride SA (KLOR-CON M) CR tablet 40 mEq (40 mEq Oral Given 03/06/22 1803)  potassium chloride 10 mEq in 100 mL IVPB (10 mEq Intravenous New Bag/Given 03/06/22 2159)  magnesium sulfate IVPB 2 g 50 mL (0 g Intravenous Stopped 03/06/22 1953)     IMPRESSION / MDM / ASSESSMENT AND PLAN / ED COURSE  I reviewed the triage vital signs and the nursing notes.                               Assessment and plan:  Weakness:  77 year old female presents to the emergency department with acute onset of weakness after an episode of vomiting.  Vital signs were reassuring at triage.  On exam, patient was alert, active and nontoxic-appearing.   CBC indicated mildly elevated white blood cell count.  Patient had hypokalemia at 2.7 on BMP.  Troponin within range.  CT head unremarkable.  EKG indicated atrial paced rhythm with no ST segment elevation or other apparent arrhythmia.  Patient received 2 g of magnesium, 40 mEq of oral potassium and 30 mg of IV potassium chloride while in the emergency department.  Her repeat potassium level was within range.  Patient reported that all of her symptoms resolved after supplemental potassium.  Return precautions were given to return with new or worsening symptoms.  All patient questions were answered.  FINAL CLINICAL IMPRESSION(S) / ED DIAGNOSES   Final diagnoses:  Hypokalemia     Rx / DC Orders   ED Discharge Orders     None        Note:  This document was prepared using Dragon voice recognition software and may include unintentional dictation errors.   Vallarie Mare [place], PA-C 03/06/22 2335    Lucillie Garfinkel, MD 03/07/22 1500

## 2022-03-06 NOTE — ED Notes (Signed)
Clean brief and pure wick applied. Pt resting comfortably at this time.

## 2022-03-08 ENCOUNTER — Encounter: Payer: Self-pay | Admitting: Internal Medicine

## 2022-04-25 ENCOUNTER — Other Ambulatory Visit: Payer: Self-pay | Admitting: Pulmonary Disease

## 2022-04-25 NOTE — Telephone Encounter (Signed)
Noted.  Rx sent.

## 2022-04-25 NOTE — Telephone Encounter (Signed)
Patient is requesting Rx on Gabapentin.  Dr. Jayme Cloud, please advise.

## 2022-04-25 NOTE — Telephone Encounter (Signed)
Okay to refill.  It is documented on my note of 25 January, it was given as a trial for intractable cough.  Gabapentin 100 mg, 1 tablet (capsule) at bedtime.

## 2022-04-26 ENCOUNTER — Encounter (HOSPITAL_COMMUNITY): Payer: Self-pay

## 2022-04-26 ENCOUNTER — Inpatient Hospital Stay (HOSPITAL_COMMUNITY): Admit: 2022-04-26 | Payer: 59

## 2022-04-26 ENCOUNTER — Emergency Department: Payer: 59

## 2022-04-26 ENCOUNTER — Observation Stay
Admission: EM | Admit: 2022-04-26 | Discharge: 2022-04-27 | Disposition: A | Discharge status: Home Health | Payer: 59 | Attending: Internal Medicine | Admitting: Internal Medicine

## 2022-04-26 ENCOUNTER — Other Ambulatory Visit: Payer: Self-pay

## 2022-04-26 DIAGNOSIS — I129 Hypertensive chronic kidney disease with stage 1 through stage 4 chronic kidney disease, or unspecified chronic kidney disease: Secondary | ICD-10-CM | POA: Insufficient documentation

## 2022-04-26 DIAGNOSIS — R4182 Altered mental status, unspecified: Secondary | ICD-10-CM | POA: Diagnosis present

## 2022-04-26 DIAGNOSIS — R4781 Slurred speech: Secondary | ICD-10-CM | POA: Insufficient documentation

## 2022-04-26 DIAGNOSIS — Z87891 Personal history of nicotine dependence: Secondary | ICD-10-CM | POA: Diagnosis not present

## 2022-04-26 DIAGNOSIS — R7301 Impaired fasting glucose: Secondary | ICD-10-CM | POA: Insufficient documentation

## 2022-04-26 DIAGNOSIS — Z950 Presence of cardiac pacemaker: Secondary | ICD-10-CM | POA: Diagnosis not present

## 2022-04-26 DIAGNOSIS — R471 Dysarthria and anarthria: Secondary | ICD-10-CM

## 2022-04-26 DIAGNOSIS — R4701 Aphasia: Secondary | ICD-10-CM | POA: Diagnosis not present

## 2022-04-26 DIAGNOSIS — G459 Transient cerebral ischemic attack, unspecified: Principal | ICD-10-CM

## 2022-04-26 DIAGNOSIS — R2681 Unsteadiness on feet: Secondary | ICD-10-CM | POA: Diagnosis not present

## 2022-04-26 DIAGNOSIS — Z79899 Other long term (current) drug therapy: Secondary | ICD-10-CM | POA: Diagnosis not present

## 2022-04-26 DIAGNOSIS — R262 Difficulty in walking, not elsewhere classified: Secondary | ICD-10-CM | POA: Insufficient documentation

## 2022-04-26 DIAGNOSIS — Z720 Tobacco use: Secondary | ICD-10-CM | POA: Diagnosis present

## 2022-04-26 DIAGNOSIS — I639 Cerebral infarction, unspecified: Secondary | ICD-10-CM

## 2022-04-26 DIAGNOSIS — Z8673 Personal history of transient ischemic attack (TIA), and cerebral infarction without residual deficits: Secondary | ICD-10-CM

## 2022-04-26 DIAGNOSIS — F03918 Unspecified dementia, unspecified severity, with other behavioral disturbance: Secondary | ICD-10-CM

## 2022-04-26 DIAGNOSIS — K2970 Gastritis, unspecified, without bleeding: Secondary | ICD-10-CM | POA: Diagnosis present

## 2022-04-26 DIAGNOSIS — N1831 Chronic kidney disease, stage 3a: Secondary | ICD-10-CM | POA: Diagnosis not present

## 2022-04-26 DIAGNOSIS — I10 Essential (primary) hypertension: Secondary | ICD-10-CM | POA: Diagnosis present

## 2022-04-26 LAB — CBC
HCT: 41.8 % (ref 36.0–46.0)
Hemoglobin: 13.9 g/dL (ref 12.0–15.0)
MCH: 27.2 pg (ref 26.0–34.0)
MCHC: 33.3 g/dL (ref 30.0–36.0)
MCV: 81.8 fL (ref 80.0–100.0)
Platelets: 297 10*3/uL (ref 150–400)
RBC: 5.11 MIL/uL (ref 3.87–5.11)
RDW: 13.7 % (ref 11.5–15.5)
WBC: 7.3 10*3/uL (ref 4.0–10.5)
nRBC: 0 % (ref 0.0–0.2)

## 2022-04-26 LAB — DIFFERENTIAL
Abs Immature Granulocytes: 0.02 10*3/uL (ref 0.00–0.07)
Basophils Absolute: 0 10*3/uL (ref 0.0–0.1)
Basophils Relative: 1 %
Eosinophils Absolute: 0 10*3/uL (ref 0.0–0.5)
Eosinophils Relative: 1 %
Immature Granulocytes: 0 %
Lymphocytes Relative: 27 %
Lymphs Abs: 2 10*3/uL (ref 0.7–4.0)
Monocytes Absolute: 0.3 10*3/uL (ref 0.1–1.0)
Monocytes Relative: 4 %
Neutro Abs: 5 10*3/uL (ref 1.7–7.7)
Neutrophils Relative %: 67 %

## 2022-04-26 LAB — COMPREHENSIVE METABOLIC PANEL
ALT: 13 U/L (ref 0–44)
AST: 23 U/L (ref 15–41)
Albumin: 4.1 g/dL (ref 3.5–5.0)
Alkaline Phosphatase: 91 U/L (ref 38–126)
Anion gap: 8 (ref 5–15)
BUN: 12 mg/dL (ref 8–23)
CO2: 20 mmol/L — ABNORMAL LOW (ref 22–32)
Calcium: 9.4 mg/dL (ref 8.9–10.3)
Chloride: 107 mmol/L (ref 98–111)
Creatinine, Ser: 1.04 mg/dL — ABNORMAL HIGH (ref 0.44–1.00)
GFR, Estimated: 55 mL/min — ABNORMAL LOW (ref >60)
Glucose, Bld: 208 mg/dL — ABNORMAL HIGH (ref 70–99)
Potassium: 3.9 mmol/L (ref 3.5–5.1)
Sodium: 135 mmol/L (ref 135–145)
Total Bilirubin: 0.7 mg/dL (ref 0.3–1.2)
Total Protein: 7.5 g/dL (ref 6.5–8.1)

## 2022-04-26 LAB — URINALYSIS, ROUTINE W REFLEX MICROSCOPIC
Bilirubin Urine: NEGATIVE
Glucose, UA: NEGATIVE mg/dL
Hgb urine dipstick: NEGATIVE
Ketones, ur: NEGATIVE mg/dL
Leukocytes,Ua: NEGATIVE
Nitrite: NEGATIVE
Protein, ur: NEGATIVE mg/dL
Specific Gravity, Urine: 1.04 — ABNORMAL HIGH (ref 1.005–1.030)
pH: 5 (ref 5.0–8.0)

## 2022-04-26 LAB — ETHANOL: Alcohol, Ethyl (B): <10 mg/dL (ref <10)

## 2022-04-26 LAB — URINE DRUG SCREEN, QUALITATIVE (ARMC ONLY)
Amphetamines, Ur Screen: NOT DETECTED
Barbiturates, Ur Screen: NOT DETECTED
Benzodiazepine, Ur Scrn: NOT DETECTED
Cannabinoid 50 Ng, Ur ~~LOC~~: NOT DETECTED
Cocaine Metabolite,Ur ~~LOC~~: NOT DETECTED
MDMA (Ecstasy)Ur Screen: NOT DETECTED
Methadone Scn, Ur: NOT DETECTED
Opiate, Ur Screen: POSITIVE — AB
Phencyclidine (PCP) Ur S: NOT DETECTED
Tricyclic, Ur Screen: NOT DETECTED

## 2022-04-26 LAB — CBG MONITORING, ED: Glucose-Capillary: 197 mg/dL — ABNORMAL HIGH (ref 70–99)

## 2022-04-26 LAB — PROTIME-INR
INR: 1.1 (ref 0.8–1.2)
Prothrombin Time: 13.9 seconds (ref 11.4–15.2)

## 2022-04-26 LAB — APTT: aPTT: 28 seconds (ref 24–36)

## 2022-04-26 MED ORDER — CLOPIDOGREL BISULFATE 75 MG PO TABS
75.0000 mg | ORAL_TABLET | Freq: Every day | ORAL | Status: DC
  Administered 2022-04-26 – 2022-04-27 (×2): 75 mg via ORAL
  Filled 2022-04-26 (×2): qty 1

## 2022-04-26 MED ORDER — MEMANTINE HCL 5 MG PO TABS
5.0000 mg | ORAL_TABLET | Freq: Two times a day (BID) | ORAL | Status: DC
  Administered 2022-04-26 – 2022-04-27 (×2): 5 mg via ORAL
  Filled 2022-04-26 (×2): qty 1

## 2022-04-26 MED ORDER — SERTRALINE HCL 50 MG PO TABS
25.0000 mg | ORAL_TABLET | Freq: Every day | ORAL | Status: DC
  Administered 2022-04-27: 25 mg via ORAL
  Filled 2022-04-26: qty 1

## 2022-04-26 MED ORDER — PREDNISONE 20 MG PO TABS: 20.0000 mg | ORAL_TABLET | Freq: Every day | ORAL | Status: DC

## 2022-04-26 MED ORDER — SODIUM CHLORIDE 0.9 % IV SOLN: INTRAVENOUS | Status: DC

## 2022-04-26 MED ORDER — ATORVASTATIN CALCIUM 20 MG PO TABS
40.0000 mg | ORAL_TABLET | Freq: Every day | ORAL | Status: DC
  Administered 2022-04-26: 40 mg via ORAL
  Filled 2022-04-26: qty 2

## 2022-04-26 MED ORDER — ACETAMINOPHEN 325 MG PO TABS
650.0000 mg | ORAL_TABLET | ORAL | Status: DC | PRN
  Administered 2022-04-27: 650 mg via ORAL
  Filled 2022-04-26: qty 2

## 2022-04-26 MED ORDER — STROKE: EARLY STAGES OF RECOVERY BOOK: Freq: Once | Status: AC

## 2022-04-26 MED ORDER — HEPARIN SODIUM (PORCINE) 5000 UNIT/ML IJ SOLN
5000.0000 [IU] | Freq: Three times a day (TID) | INTRAMUSCULAR | Status: DC
  Administered 2022-04-26 – 2022-04-27 (×2): 5000 [IU] via SUBCUTANEOUS
  Filled 2022-04-26 (×2): qty 1

## 2022-04-26 MED ORDER — ACETAMINOPHEN 650 MG RE SUPP: 650.0000 mg | RECTAL | Status: DC | PRN

## 2022-04-26 MED ORDER — ACETAMINOPHEN 160 MG/5ML PO SOLN: 650.0000 mg | ORAL | Status: DC | PRN

## 2022-04-26 MED ORDER — DONEPEZIL HCL 5 MG PO TABS
10.0000 mg | ORAL_TABLET | Freq: Every day | ORAL | Status: DC
  Administered 2022-04-26: 10 mg via ORAL
  Filled 2022-04-26: qty 2

## 2022-04-26 MED ORDER — ALBUTEROL SULFATE (2.5 MG/3ML) 0.083% IN NEBU: 3.0000 mL | INHALATION_SOLUTION | Freq: Four times a day (QID) | RESPIRATORY_TRACT | Status: DC | PRN

## 2022-04-26 MED ORDER — IOHEXOL 350 MG/ML SOLN
75.0000 mL | Freq: Once | INTRAVENOUS | Status: AC | PRN
  Administered 2022-04-26: 75 mL via INTRAVENOUS

## 2022-04-26 NOTE — Progress Notes (Signed)
   04/26/22 1700  Spiritual Encounters  Type of Visit Initial  Care provided to: Patient  Referral source Code page  Reason for visit Code  OnCall Visit Yes   Chaplain responded to Code Stroke. Patient being attended to by care team. No family present. Chaplain services available if needed.

## 2022-04-26 NOTE — Progress Notes (Signed)
Patient arrived to floor on stretcher from ED by nurse. Walked to bed from stretcher. A/O x 3. On room air. IV intact to left arm. Made comfortable in bed. Orientated to room. Bed alarm on. Tele attached.

## 2022-04-26 NOTE — H&P (Incomplete)
History and Physical     Patient: Tracy Barton ZOX:[id number] DOB: July 21, 1945 DOA: 04/26/2022 DOS: the patient was seen and examined on 04/27/2022 PCP: Emogene Morgan, MD   Patient coming from: Home  Chief Complaint: Altered mental status  HISTORY OF PRESENT ILLNESS: Tracy Barton is an 77 y.o. female brought to the emergency room via EMS for altered mental status that started around 10 AM which was her last known normal.   Patient has some mild cognitive impairment at baseline and dementia but this was different.  Per report patient was unable to identify family and was disoriented and did not know where she was or the time or the place.   Patient was noted to have aphasia with word finding difficulty. Chart review shows patient requesting refill with pulmonology day prior.  Per note patient has minimal obstructive airway disease and chronic cough. On my exam today patient continues to cough. Patient has had a perforated gastric ulcer and the use of NSAIDs is contraindicated. Patient had a [employer] dual-chamber pacemaker placed on May 25, 2021 by Dr. Darrold Junker and follows up with regular cardiologist Dr. Call would with [facility] for sick sinus syndrome.  Past Medical History:  Diagnosis Date   Blood transfusion without reported diagnosis    Depression    Neuromuscular disorder    Review of Systems  All other systems reviewed and are negative.  Allergies  Allergen Reactions   Nsaids Other (See Comments)    PERFORATED ULCER   Past Surgical History:  Procedure Laterality Date   BRAIN SURGERY     LAPAROSCOPY N/A 01/10/2020   Procedure: LAPAROSCOPY DIAGNOSTIC laparoscopic washout omental patch splenectomy;  Surgeon: Karie Soda, MD;  Location: [facility];  Service: General;  Laterality: N/A;   LAPAROTOMY N/A 01/10/2020   Procedure: EXPLORATORY LAPAROTOMY;  Surgeon: Karie Soda, MD;  Location: [facility];  Service: General;  Laterality: N/A;   PACEMAKER IMPLANT N/A  05/25/2021   Procedure: PACEMAKER IMPLANT;  Surgeon: Marcina Millard, MD;  Location: [facility] INVASIVE CV LAB;  Service: Cardiovascular;  Laterality: N/A;   TUBAL LIGATION     MEDICATIONS: Prior to Admission medications   Medication Sig Start Date End Date Taking? Authorizing Provider  potassium chloride (KLOR-CON) 10 MEQ tablet Take 1 tablet (10 mEq total) by mouth daily. 03/01/22   Duke Salvia, MD  albuterol (VENTOLIN HFA) 108 (90 Base) MCG/ACT inhaler Inhale 1-2 puffs into the lungs every 6 (six) hours as needed. 02/24/22   Parrett, Virgel Bouquet, NP  alendronate (FOSAMAX) 70 MG tablet Take 70 mg by mouth once a week. 10/21/21   [provider]  atorvastatin (LIPITOR) 40 MG tablet Take 1 tablet (40 mg total) by mouth at bedtime. 05/09/20   Glade Lloyd, MD  benzonatate (TESSALON) 200 MG capsule Take 1 capsule (200 mg total) by mouth 3 (three) times daily as needed. 02/24/22 02/24/23  Parrett, Virgel Bouquet, NP  celecoxib (CELEBREX) 200 MG capsule Take 1 capsule by mouth daily. Patient not taking: Reported on 02/24/2022 11/18/21   [provider]  donepezil (ARICEPT) 10 MG tablet Take 10 mg by mouth at bedtime.    [provider]  esomeprazole (NEXIUM) 40 MG capsule Take 1 capsule (40 mg total) by mouth daily. Patient not taking: Reported on 01/30/2022 12/13/21 04/12/22  Salena Saner, MD  Fluticasone-Umeclidin-Vilant (TRELEGY ELLIPTA) 100-62.5-25 MCG/ACT AEPB Inhale 1 puff into the lungs daily. Patient not taking: Reported on 02/24/2022 01/26/22   Salena Saner, MD  gabapentin (  NEURONTIN) 100 MG capsule TAKE 1 CAPSULE BY MOUTH AT BEDTIME 04/25/22   Salena Saner, MD  guaiFENesin-codeine [facility]) 100-10 MG/5ML syrup Take 5 mLs by mouth 3 (three) times daily as needed for cough. Patient not taking: Reported on 01/30/2022 01/26/22   Salena Saner, MD  ipratropium (ATROVENT) 0.03 % nasal spray Place 2 sprays into both nostrils every 12 (twelve) hours. Patient  not taking: Reported on 01/30/2022 01/11/22   Glenford Bayley, NP  memantine (NAMENDA) 5 MG tablet Take 5 mg by mouth 2 (two) times daily.    [provider]  predniSONE (DELTASONE) 20 MG tablet Take 1 tablet (20 mg total) by mouth daily with breakfast. 02/24/22   Parrett, Virgel Bouquet, NP  promethazine-dextromethorphan (PROMETHAZINE-DM) 6.25-15 MG/5ML syrup Take 5 mLs by mouth 4 (four) times daily as needed for cough. 01/11/22   Glenford Bayley, NP  sertraline (ZOLOFT) 25 MG tablet Take 25 mg by mouth daily.    [provider]  thiamine (VITAMIN B1) 100 MG tablet Take by mouth. Take 1 tablet (100 mg total) by mouth once daily 06/09/21   [provider]  traMADol (ULTRAM) 50 MG tablet Take 50 mg by mouth every 12 (twelve) hours as needed.    [provider]  traZODone (DESYREL) 50 MG tablet Take 50-100 mg by mouth at bedtime as needed for sleep.    [provider]    atorvastatin  40 mg Oral QHS   clopidogrel  75 mg Oral Daily   donepezil  10 mg Oral QHS   heparin  5,000 Units Subcutaneous Q8H   memantine  5 mg Oral BID   sertraline  25 mg Oral Daily     sodium chloride 20 mL/hr at 04/26/22 2130   ED Course: Pt in Ed is alert/awake cooperative but oriented to self and location. Does not recall what happened ort why she is in hospital.  Vitals:   04/26/22 1804 04/26/22 1845 04/26/22 1907 04/26/22 2046  BP: (!) 122/109  (!) 150/67 138/85  Pulse:  61 (!) 57 63  Resp:  17 (!) 21 19  Temp:    98.8 F (37.1 C)  TempSrc:      SpO2:  96% 97% 98%  Weight:      Height:       No intake/output data recorded. SpO2: 98 % Blood work in ed shows: CMP shows glucose of 208 creatinine of 1.04 EGFR 55. Troponin of 7. CBC shows normal white count normal hemoglobin normal platelet count. Urinalysis today is clear and yellow nitrite negative negative for leukocyte esterase. Urine drug screen positive for opiates. Patient in the ED and was given Plavix due to  the patient's allergy to NSAIDs patient in the emergency room was given Plavix as part of code stroke antiplatelet therapy as she is allergic to NSAIDs.  Results for orders placed or performed during the hospital encounter of 04/26/22 (from the past 72 hour(s))  Ethanol     Status: None   Collection Time: 04/26/22  4:55 PM  Result Value Ref Range   Alcohol, Ethyl (B) <10 <10 mg/dL    Comment: (NOTE) Lowest detectable limit for serum alcohol is 10 mg/dL.  For medical purposes only. Performed at [facility], [address] [place], [place] [address]   Protime-INR     Status: None   Collection Time: 04/26/22  4:55 PM  Result Value Ref Range   Prothrombin Time 13.9 11.4 - 15.2 seconds  INR 1.1 0.8 - 1.2    Comment: (NOTE) INR goal varies based on device and disease states. Performed at [facility], [address] [place], [place] [address]   APTT     Status: None   Collection Time: 04/26/22  4:55 PM  Result Value Ref Range   aPTT 28 24 - 36 seconds    Comment: Performed at [facility], [address] [place], [place] [address]  CBC     Status: None   Collection Time: 04/26/22  4:55 PM  Result Value Ref Range   WBC 7.3 4.0 - 10.5 K/uL   RBC 5.11 3.87 - 5.11 MIL/uL   Hemoglobin 13.9 12.0 - 15.0 g/dL   HCT 82.9 56.2 - 13.0 %   MCV 81.8 80.0 - 100.0 fL   MCH 27.2 26.0 - 34.0 pg   MCHC 33.3 30.0 - 36.0 g/dL   RDW 86.5 78.4 - 69.6 %   Platelets 297 150 - 400 K/uL   nRBC 0.0 0.0 - 0.2 %    Comment: Performed at [facility], [address] [place], [place] [address]  Differential     Status: None   Collection Time: 04/26/22  4:55 PM  Result Value Ref Range   Neutrophils Relative % 67 %   Neutro Abs 5.0 1.7 - 7.7 K/uL   Lymphocytes Relative 27 %   Lymphs Abs 2.0 0.7 - 4.0 K/uL   Monocytes Relative 4 %   Monocytes Absolute 0.3 0.1 - 1.0 K/uL   Eosinophils Relative 1 %   Eosinophils Absolute 0.0 0.0 - 0.5 K/uL   Basophils  Relative 1 %   Basophils Absolute 0.0 0.0 - 0.1 K/uL   Immature Granulocytes 0 %   Abs Immature Granulocytes 0.02 0.00 - 0.07 K/uL    Comment: Performed at [facility], [address] [place], [place] [address]  Comprehensive metabolic panel     Status: Abnormal   Collection Time: 04/26/22  4:55 PM  Result Value Ref Range   Sodium 135 135 - 145 mmol/L   Potassium 3.9 3.5 - 5.1 mmol/L   Chloride 107 98 - 111 mmol/L   CO2 20 (L) 22 - 32 mmol/L   Glucose, Bld 208 (H) 70 - 99 mg/dL    Comment: Glucose reference range applies only to samples taken after fasting for at least 8 hours.   BUN 12 8 - 23 mg/dL   Creatinine, Ser 4.01 (H) 0.44 - 1.00 mg/dL   Calcium 9.4 8.9 - 02.7 mg/dL   Total Protein 7.5 6.5 - 8.1 g/dL   Albumin 4.1 3.5 - 5.0 g/dL   AST 23 15 - 41 U/L   ALT 13 0 - 44 U/L   Alkaline Phosphatase 91 38 - 126 U/L   Total Bilirubin 0.7 0.3 - 1.2 mg/dL   GFR, Estimated 55 (L) >60 mL/min    Comment: (NOTE) Calculated using the CKD-EPI Creatinine Equation (2021)    Anion gap 8 5 - 15    Comment: Performed at [facility], [address] [place], [place] [address]  CBG monitoring, ED     Status: Abnormal   Collection Time: 04/26/22  4:56 PM  Result Value Ref Range   Glucose-Capillary 197 (H) 70 - 99 mg/dL    Comment: Glucose reference range applies only to samples taken after fasting for at least 8 hours.  Urine Drug Screen, Qualitative     Status: Abnormal   Collection Time: 04/26/22  5:42  PM  Result Value Ref Range   Tricyclic, Ur Screen NONE DETECTED NONE DETECTED   Amphetamines, Ur Screen NONE DETECTED NONE DETECTED   MDMA (Ecstasy)Ur Screen NONE DETECTED NONE DETECTED   Cocaine Metabolite,Ur Lloyd NONE DETECTED NONE DETECTED   Opiate, Ur Screen POSITIVE (A) NONE DETECTED   Phencyclidine (PCP) Ur S NONE DETECTED NONE DETECTED   Cannabinoid 50 Ng, Ur [place] NONE DETECTED NONE DETECTED   Barbiturates, Ur Screen NONE DETECTED NONE DETECTED   Benzodiazepine,  Ur Scrn NONE DETECTED NONE DETECTED   Methadone Scn, Ur NONE DETECTED NONE DETECTED    Comment: (NOTE) Tricyclics + metabolites, urine    Cutoff 1000 ng/mL Amphetamines + metabolites, urine  Cutoff 1000 ng/mL MDMA (Ecstasy), urine              Cutoff 500 ng/mL Cocaine Metabolite, urine          Cutoff 300 ng/mL Opiate + metabolites, urine        Cutoff 300 ng/mL Phencyclidine (PCP), urine         Cutoff 25 ng/mL Cannabinoid, urine                 Cutoff 50 ng/mL Barbiturates + metabolites, urine  Cutoff 200 ng/mL Benzodiazepine, urine              Cutoff 200 ng/mL Methadone, urine                   Cutoff 300 ng/mL  The urine drug screen provides only a preliminary, unconfirmed analytical test result and should not be used for non-medical purposes. Clinical consideration and professional judgment should be applied to any positive drug screen result due to possible interfering substances. A more specific alternate chemical method must be used in order to obtain a confirmed analytical result. Gas chromatography / mass spectrometry (GC/MS) is the preferred confirm atory method. Performed at [facility], [address] [place], [place] [address]   Urinalysis, Routine w reflex microscopic -Urine, Clean Catch     Status: Abnormal   Collection Time: 04/26/22  5:42 PM  Result Value Ref Range   Color, Urine YELLOW (A) YELLOW   APPearance CLEAR (A) CLEAR   Specific Gravity, Urine 1.040 (H) 1.005 - 1.030   pH 5.0 5.0 - 8.0   Glucose, UA NEGATIVE NEGATIVE mg/dL   Hgb urine dipstick NEGATIVE NEGATIVE   Bilirubin Urine NEGATIVE NEGATIVE   Ketones, ur NEGATIVE NEGATIVE mg/dL   Protein, ur NEGATIVE NEGATIVE mg/dL   Nitrite NEGATIVE NEGATIVE   Leukocytes,Ua NEGATIVE NEGATIVE    Comment: Performed at [facility], [address] [place], [place] [address]    Lab Results  Component Value Date   CREATININE 1.04 (H) 04/26/2022   CREATININE 1.11 (H) 03/06/2022    CREATININE 1.03 (H) 02/24/2022      Latest Ref Rng & Units 04/26/2022    4:55 PM 03/06/2022   10:44 PM 03/06/2022    4:19 PM  CMP  Glucose 70 - 99 mg/dL 098   119   BUN 8 - 23 mg/dL 12   12   Creatinine 1.47 - 1.00 mg/dL 8.29   5.62   Sodium 130 - 145 mmol/L 135   139   Potassium 3.5 - 5.1 mmol/L 3.9  3.5  2.7   Chloride 98 - 111 mmol/L 107   104   CO2 22 - 32 mmol/L 20   25   Calcium 8.9 - 10.3 mg/dL 9.4   8.8  Total Protein 6.5 - 8.1 g/dL 7.5     Total Bilirubin 0.3 - 1.2 mg/dL 0.7     Alkaline Phos 38 - 126 U/L 91     AST 15 - 41 U/L 23     ALT 0 - 44 U/L 13      Unresulted Labs (From admission, onward)     Start     Ordered   04/27/22 0500  Lipid panel  (Labs)  Tomorrow morning,   URGENT       Comments: Fasting    04/26/22 1913   04/26/22 1911  Hemoglobin A1c  (Labs)  Once,   URGENT       Comments: To assess prior glycemic control    04/26/22 1913           Pt has received : Orders Placed This Encounter  Procedures   1-3 Lead EKG Interpretation    This order was created via procedure documentation    Standing Status:   Standing    Number of Occurrences:   1   CT HEAD CODE STROKE WO CONTRAST    CT Head Code Stroke - for patients within the Code Stroke Window    Standing Status:   Standing    Number of Occurrences:   1    Order Specific Question:   Radiology Contrast Protocol - do NOT remove file path    Answer:   [email or web address]   CT ANGIO HEAD NECK W WO CM (CODE STROKE)    Standing Status:   Standing    Number of Occurrences:   1    Order Specific Question:   Does the patient have a contrast media/X-ray dye allergy?    Answer:   No    Order Specific Question:   If indicated for the ordered procedure, I authorize the administration of contrast media per Radiology protocol    Answer:   Yes   MR BRAIN WO CONTRAST    Standing Status:   Standing    Number of Occurrences:   1    Order Specific Question:   What is the  patient's sedation requirement?    Answer:   No Sedation    Order Specific Question:   Does the patient have a pacemaker or implanted devices?    Answer:   No    Order Specific Question:   Radiology Contrast Protocol - do NOT remove file path    Answer:   [email or web address]   MR ANGIO HEAD WO CONTRAST    Standing Status:   Standing    Number of Occurrences:   1    Order Specific Question:   What is the patient's sedation requirement?    Answer:   No Sedation    Order Specific Question:   Does the patient have a pacemaker or implanted devices?    Answer:   No   MR MRA NECK W CONTRAST    Standing Status:   Standing    Number of Occurrences:   1    Order Specific Question:   If indicated for the ordered procedure, I authorize the administration of contrast media per Radiology protocol    Answer:   Yes    Order Specific Question:   What is the patient's sedation requirement?    Answer:   No Sedation    Order Specific Question:   Does the patient have a pacemaker or implanted devices?    Answer:   No   Ethanol    Standing  Status:   Standing    Number of Occurrences:   1   Protime-INR    Standing Status:   Standing    Number of Occurrences:   1   APTT    Standing Status:   Standing    Number of Occurrences:   1   CBC    Standing Status:   Standing    Number of Occurrences:   1   Differential    Standing Status:   Standing    Number of Occurrences:   1   Comprehensive metabolic panel    Standing Status:   Standing    Number of Occurrences:   1   Urine Drug Screen, Qualitative    Standing Status:   Standing    Number of Occurrences:   1   Urinalysis, Routine w reflex microscopic -Urine, Clean Catch    Standing Status:   Standing    Number of Occurrences:   1    Order Specific Question:   Specimen Source    Answer:   Urine, Clean Catch [76]   Lipid panel    Fasting    Standing Status:   Standing    Number of Occurrences:   1    Hemoglobin A1c    To assess prior glycemic control    Standing Status:   Standing    Number of Occurrences:   1   Diet heart healthy/carb modified Room service appropriate? Yes; Fluid consistency: Thin    Standing Status:   Standing    Number of Occurrences:   1    Order Specific Question:   Diet-HS Snack?    Answer:   Nothing    Order Specific Question:   Room service appropriate?    Answer:   Yes    Order Specific Question:   Fluid consistency:    Answer:   Thin   Vital signs    Standing Status:   Standing    Number of Occurrences:   1   NIH stroke score    On arrival and q 2h x 12h, then q 4h    Standing Status:   Standing    Number of Occurrences:   1   Swallow screen    Standing Status:   Standing    Number of Occurrences:   1   Activate teleneurology    Call [facility] and activate tele-neurology    Standing Status:   Standing    Number of Occurrences:   1   Initiate Carrier Fluid Protocol    Standing Status:   Standing    Number of Occurrences:   1   If O2 sat If O2 Sat < 94%, administer O2 at 2 liters/minute via nasal cannula.    If O2 Sat < 94%, administer O2 at 2 liters/minute via nasal cannula.    Standing Status:   Standing    Number of Occurrences:   1   In and Out Cath    Standing Status:   Standing    Number of Occurrences:   1   NIHSS score documentation NIHSS score range: 0-42    NIHSS score range: 0-42    Standing Status:   Standing    Number of Occurrences:   1   Vital signs    Standing Status:   Standing    Number of Occurrences:   1   Notify physician (specify)    Standing Status:   Standing    Number of Occurrences:  20    Order Specific Question:   Notify Physician    Answer:   change in neurologic status based on Neurologic Worsening protocol    Order Specific Question:   Notify Physician    Answer:   new onset dysrhythmia    Order Specific Question:   Notify Physician    Answer:   Systolic BP > 180 (not controlled by PRN continuous infusion  medications) or less than 100    Order Specific Question:   Notify Physician    Answer:   Pox < 88% despite the use of supplemental O2 via nasal cannula    Order Specific Question:   Notify Physician    Answer:   Heart Rate > 120 or less than 50    Order Specific Question:   Notify Physician    Answer:   Respiratory Rate > 30    Order Specific Question:   Notify Physician    Answer:   Blood Glucose > /dl or less than /dl    Order Specific Question:   Notify Physician    Answer:   Temperature goal of 37 C (98.6 F). Acceptable range: 36.5 C - 37.5 C (97.7 F - 99.5 F)    Order Specific Question:   Notify Physician    Answer:   Urine output > 300 ml in 1 hour or less than 30 ml in 4 hours (unless anuric)   OOB with assistance    Standing Status:   Standing    Number of Occurrences:   [phone]   Activity as tolerated    Standing Status:   Standing    Number of Occurrences:   1   Swallow screen - If patient does NOT pass this screen, place order for SLP eval and treat (SLP2) - swallowing evaluation (BSE, MBS and/or diet order as indicated)    - If patient does NOT pass this screen, place order for SLP eval and treat (SLP2) - swallowing evaluation (BSE, MBS and/or diet order as indicated)    Standing Status:   Standing    Number of Occurrences:   1   NIH stroke scale    Document NIHSS on arrival to unit and each shift    Standing Status:   Standing    Number of Occurrences:   1   Intake and output    Avoid use of indwelling catheter    Standing Status:   Standing    Number of Occurrences:   1   Cardiac Monitoring Continuous x 24 hours Indications for use: Acute neurological event    Standing Status:   Standing    Number of Occurrences:   1    Order Specific Question:   Indications for use:    Answer:   Acute neurological event   Apply Stroke Care Plan: Ischemic Stroke, TIA    Standing Status:   Standing    Number of Occurrences:   1   Discuss with patient and document patient's  goals for stroke risk factor reduction    Standing Status:   Standing    Number of Occurrences:   1   Initiate Oral Care Protocol    Standing Status:   Standing    Number of Occurrences:   1   Initiate Carrier Fluid Protocol    Standing Status:   Standing    Number of Occurrences:   1   Provide stroke education material to patient and family.    Standing Status:   Standing  Number of Occurrences:   1   Nurse to provide smoking / tobacco cessation education    Standing Status:   Standing    Number of Occurrences:   1   If the patient has passed the Stroke Swallow Screen or has a feeding tube, then RN may order General Admission PRN Orders (through manage orders) for the following patient needs: allergy symptoms (Claritin), cold sores (Carmex), cough (Robitussin DM), eye irritation (Liquifilm Tears), hemorrhoids (Tucks), indigestion (Maalox), minor skin irritation (hydrocortisone cream), muscle pain Romeo Apple Gay), nose irritation (saline nasal spray) and sore throat (Chloraseptic spray).    Standing Status:   Standing    Number of Occurrences:   [phone]   Full code    Standing Status:   Standing    Number of Occurrences:   1    Order Specific Question:   By:    Answer:   Other   [place] to Activate Code Stroke    Call [facility] [phone]    Standing Status:   Standing    Number of Occurrences:   1    Order Specific Question:   Reason for Consult?    Answer:   Code Stroke   Consult to hospitalist    Standing Status:   Standing    Number of Occurrences:   1    Order Specific Question:   Place call to:    Answer:   [phone]    Order Specific Question:   Reason for Consult    Answer:   Admit   Consult to Registered Dietitian    Standing Status:   Standing    Number of Occurrences:   1    Order Specific Question:   Reason for consult?    Answer:   Assessment of nutrition requirements/status   Consult to Transition of Care Team    Standing Status:   Standing    Number of Occurrences:    1    Order Specific Question:   Reason for Consult:    Answer:   Home Health / DME Needs    Order Specific Question:   Reason for Consult:    Answer:   SNF placement   OT eval and treat    Standing Status:   Standing    Number of Occurrences:   1   PT eval and treat    Standing Status:   Standing    Number of Occurrences:   1   Oxygen therapy Mode or (Route): Nasal cannula; Liters Per Minute: 2; Keep 02 saturation: greater than 94 %    Standing Status:   Standing    Number of Occurrences:   1    Order Specific Question:   Mode or (Route)    Answer:   Nasal cannula    Order Specific Question:   Liters Per Minute    Answer:   2    Order Specific Question:   Keep 02 saturation    Answer:   greater than 94 %   SLP eval and treat Reason for evaluation: Cognitive/Language evaluation    Standing Status:   Standing    Number of Occurrences:   1    Order Specific Question:   Reason for evaluation    Answer:   Cognitive/Language evaluation   CBG monitoring, ED    Standing Status:   Standing    Number of Occurrences:   1   EKG 12-Lead    Standing Status:   Standing    Number of  Occurrences:   1   ED EKG    Stroke Symptoms    Standing Status:   Standing    Number of Occurrences:   1    Order Specific Question:   Reason for Exam    Answer:   Other (See Comments)   ECHOCARDIOGRAM COMPLETE    PFO EVAL.    Standing Status:   Standing    Number of Occurrences:   1    Order Specific Question:   Perflutren DEFINITY (image enhancing agent) should be administered unless hypersensitivity or allergy exist    Answer:   Administer Perflutren    Order Specific Question:   Reason for exam-Echo    Answer:   Stroke  I63.9   Saline lock IV    Standing Status:   Standing    Number of Occurrences:   1   Place in observation (patient's expected length of stay will be less than 2 midnights)    Standing Status:   Standing    Number of Occurrences:   1    Order Specific Question:   Hospital Area     Answer:   [facility] [[id number]]    Order Specific Question:   Level of Care    Answer:   Telemetry Medical [104]    Order Specific Question:   Covid Evaluation    Answer:   Asymptomatic - no recent exposure (last 10 days) testing not required    Order Specific Question:   Diagnosis    Answer:   Aphasia Agafon.Baumgarten.3.ICD-9-CM]    Order Specific Question:   Admitting Physician    Answer:   Darrold Junker    Order Specific Question:   Attending Physician    Answer:   Darrold Junker   Fall precautions    Standing Status:   Standing    Number of Occurrences:   1   Aspiration precautions    Standing Status:   Standing    Number of Occurrences:   1    Meds ordered this encounter  Medications   iohexol (OMNIPAQUE) 350 MG/ML injection 75 mL   clopidogrel (PLAVIX) tablet 75 mg   albuterol (PROVENTIL) (2.5 MG/3ML) 0.083% nebulizer solution 3 mL   donepezil (ARICEPT) tablet 10 mg   atorvastatin (LIPITOR) tablet 40 mg   memantine (NAMENDA) tablet 5 mg   sertraline (ZOLOFT) tablet 25 mg   DISCONTD: predniSONE (DELTASONE) tablet 20 mg    stroke: early stages of recovery book   0.9 %  sodium chloride infusion   OR Linked Order Group    acetaminophen (TYLENOL) tablet 650 mg    acetaminophen (TYLENOL) 160 MG/5ML solution 650 mg    acetaminophen (TYLENOL) suppository 650 mg   heparin injection 5,000 Units    Admission Imaging : CT ANGIO HEAD NECK W WO CM (CODE STROKE)  Result Date: 04/26/2022 CLINICAL DATA:  Aphasia EXAM: CT ANGIOGRAPHY HEAD AND NECK WITH AND WITHOUT CONTRAST TECHNIQUE: Multidetector CT imaging of the head and neck was performed using the standard protocol during bolus administration of intravenous contrast. Multiplanar CT image reconstructions and MIPs were obtained to evaluate the vascular anatomy. Carotid stenosis measurements (when applicable) are obtained utilizing NASCET criteria, using the distal internal carotid diameter as the denominator.  RADIATION DOSE REDUCTION: This exam was performed according to the departmental dose-optimization program which includes automated exposure control, adjustment of the mA and/or kV according to patient size and/or use of iterative reconstruction technique. CONTRAST:  75mL OMNIPAQUE  IOHEXOL 350 MG/ML SOLN COMPARISON:  No prior CTA available, correlation is made with MRA head 05/08/2020 and CT head 04/26/2022 FINDINGS: CT HEAD FINDINGS For noncontrast findings, please see same day CT head. CTA NECK FINDINGS Aortic arch: Two-vessel arch with a common origin of the brachiocephalic and left common carotid arteries. Imaged portion shows no evidence of aneurysm or dissection. No significant stenosis of the major arch vessel origins. Right carotid system: No evidence of dissection, occlusion, or hemodynamically significant stenosis (greater than 50%). Atherosclerotic disease at the bifurcation and in the proximal ICA is not hemodynamically significant. Left carotid system: No evidence of dissection, occlusion, or hemodynamically significant stenosis (greater than 50%). Atherosclerotic disease at the bifurcation and in the proximal ICA is not hemodynamically significant. Vertebral arteries: No evidence of dissection, occlusion, or hemodynamically significant stenosis (greater than 50%). Skeleton: No acute osseous abnormality. Degenerative changes in the cervical spine. Other neck: Negative. Upper chest: Bronchial wall thickening. Emphysema. No focal pulmonary opacity or pleural effusion. Review of the MIP images confirms the above findings CTA HEAD FINDINGS Evaluation is somewhat limited by motion artifact. Anterior circulation: Both internal carotid arteries are patent to the termini, with severe stenosis in the bilateral cavernous segments. A1 segments patent. Normal anterior communicating artery. Anterior cerebral arteries are patent to their distal aspects without significant stenosis. No M1 stenosis or occlusion. MCA are  grossly perfused to their distal aspects, although evaluation is limited by motion. In addition, the right MCA branches appear somewhat less opacified compared to the left MCA branches. Posterior circulation: Vertebral arteries patent to the vertebrobasilar junction without significant stenosis. Posterior inferior cerebellar arteries patent proximally. Basilar patent to its distal aspect without significant stenosis. Superior cerebellar arteries patent proximally. Patent right P1. Fetal origin the left PCA from the left posterior communicating artery. PCAs perfused to their distal aspects without significant stenosis. Right posterior communicating artery is not visualized. Venous sinuses: Not well evaluated due to phase of timing. Anatomic variants: Fetal origin the left PCA. Review of the MIP images confirms the above findings IMPRESSION: 1. Evaluation of the intracranial vasculature is somewhat limited by motion artifact. Within this limitation, no intracranial large vessel occlusion. 2. 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 3. No hemodynamically significant stenosis in the neck. Impression #1 was discussed by telephone on 04/26/2022 at 5:30 pm with provider COLLEEN STACK . Electronically Signed   By: Wiliam Ke M.D.   On: 04/26/2022 17:41   CT HEAD CODE STROKE WO CONTRAST  Result Date: 04/26/2022 CLINICAL DATA:  Code stroke. Neuro deficit, acute, stroke suspected. Dementia. EXAM: CT HEAD WITHOUT CONTRAST TECHNIQUE: Contiguous axial images were obtained from the base of the skull through the vertex without intravenous contrast. RADIATION DOSE REDUCTION: This exam was performed according to the departmental dose-optimization program which includes automated exposure control, adjustment of the mA and/or kV according to patient size and/or use of iterative reconstruction technique. COMPARISON:  03/06/2022 FINDINGS: Brain: No focal abnormality seen affecting the brainstem or cerebellum. Old left  occipital stroke as seen previously. Chronic small-vessel ischemic changes of the cerebral hemispheric white matter. No sign of acute infarction, mass lesion, hemorrhage, hydrocephalus or extra-axial collection. Vascular: There is atherosclerotic calcification of the major vessels at the base of the brain. Skull: Negative Sinuses/Orbits: Clear/normal Other: Some fluid in the mastoid air cells and middle ears. ASPECTS [facility] Stroke Program Early CT Score) - Ganglionic level infarction (caudate, lentiform nuclei, internal capsule, insula, M1-M3 cortex): 7 - Supraganglionic infarction (M4-M6 cortex): 3 Total score (  0-10 with 10 being normal): 10 IMPRESSION: 1. No acute CT finding. Old left occipital stroke. Chronic small-vessel ischemic changes of the white matter. 2. Aspects is 10. These results were communicated to Dr. Selina Cooley at 5:20 pm on 04/26/2022 by text page via the [facility] messaging system. Electronically Signed   By: Paulina Fusi M.D.   On: 04/26/2022 17:21   Physical Examination: Vitals:   04/26/22 1804 04/26/22 1845 04/26/22 1907 04/26/22 2046  BP: (!) 122/109  (!) 150/67 138/85  Pulse:  61 (!) 57 63  Temp:    98.8 F (37.1 C)  Resp:  17 (!) 21 19  Height:      Weight:      SpO2:  96% 97% 98%  TempSrc:      BMI (Calculated):       Physical Exam Vitals and nursing note reviewed.  Constitutional:      General: She is not in acute distress.    Appearance: Normal appearance. She is not ill-appearing, toxic-appearing or diaphoretic.  HENT:     Head: Normocephalic and atraumatic.     Right Ear: Hearing and external ear normal.     Left Ear: Hearing and external ear normal.     Nose: Nose normal. No nasal deformity.     Mouth/Throat:     Lips: Pink.     Mouth: Mucous membranes are moist.     Tongue: No lesions.     Pharynx: Oropharynx is clear.  Eyes:     Extraocular Movements: Extraocular movements intact.     Pupils: Pupils are equal, round, and reactive to light.  Neck:      Vascular: No carotid bruit.  Cardiovascular:     Rate and Rhythm: Normal rate and regular rhythm.     Pulses: Normal pulses.     Heart sounds: Normal heart sounds.  Pulmonary:     Effort: Pulmonary effort is normal.     Breath sounds: Normal breath sounds.  Abdominal:     General: Bowel sounds are normal. There is no distension.     Palpations: Abdomen is soft. There is no mass.     Tenderness: There is no abdominal tenderness. There is no guarding.     Hernia: No hernia is present.  Musculoskeletal:     Right lower leg: No edema.     Left lower leg: No edema.  Skin:    General: Skin is warm.  Neurological:     General: No focal deficit present.     Mental Status: She is alert and oriented to person, place, and time.     Cranial Nerves: Cranial nerves 2-12 are intact.     Motor: Motor function is intact.  Psychiatric:        Attention and Perception: Attention normal.        Mood and Affect: Mood normal.        Speech: Speech normal.        Behavior: Behavior normal. Behavior is cooperative.        Cognition and Memory: Cognition normal.     Assessment and Plan: >>Aphasia/ AMS: - Admit for stroke workup - Permissive HTN x48 hrs from sx onset or until stroke ruled out by MRI goal BP <220/110. PRN labetalol or hydralazine if BP above these parameters. Avoid oral antihypertensives. Vitals:   04/26/22 1651 04/26/22 1804 04/26/22 1907 04/26/22 2046  BP: 139/88 (!) 122/109 (!) 150/67 138/85  - MRI brain wo contrast - CTA already obtained - TTE  -  Check A1c and LDL + add statin per guidelines - Plavix 75mg  daily - Please clarify patient's allergy to NSAIDs and add aspirin 81mg  daily if appropriate - q4 hr neuro checks - STAT head CT for any change in neuro exam - Tele - PT/OT/SLP - Stroke education - Amb referral to neurology upon discharge  - will get orthostatic bp prior to discharge.  -suspect this is TIA .    >>C/H cough:  We will obtain CTA Chest to identify VTE  or PNA. Patient also sees pulmonology for regular follow-ups. Supplemental oxygen as needed.  Currently SpO2: 98 % on RA.   >> Dementia/ mild cognitive impairment: Cont donepezil and memantine along with sertraline.   >>CKD stage 3a: Pt has mild CKD with gfr of 55.  >> Sick sinus syndrome status post dual-chamber Medtronic pacemaker placed in May 2023. Patient followed at [facility] clinic cardiology Dr. Juliann Pares. Patient is EKG today shows:Sinus rhythm with a PR interval of 99 QTc of 453.  Most recent echocardiogram was in March 2023 when patient was hospitalized for syncopal episodes. EF was said to be 55 to 60% normal right ventricular systolic function, normal left ventricular function no regional wall motion abnormalities. No interatrial shunt detected. Cardiology consult for device interrogation per AM Team.   >> History of CVA: Patient has a left occipital cortex infarct and cerebral atrophy history. Antiplatelet therapy contraindicated with her history of gastric ulcer that was perforated.   >> CKD stage I:  Latest Reference Range & Units 05/08/20 16:23 10/04/20 09:39 03/25/21 16:32 03/26/21 05:49 02/24/22 15:01 03/06/22 16:19 04/26/22 16:55  Creatinine 0.44 - 1.00 mg/dL 4.09 (H) 8.11 (H) 9.14 (H) 1.18 (H) 1.03 (H) 1.11 (H) 1.04 (H)  Avoid NSAID's AVOID contrast unless absolutely necessary,.  Renally dose meds based on eGfr.     >> History of perforated gastric ulcer: Will defer primary care provider to discontinue bisphosphonate therapy and provide different options possibly adequate patient increased risk of perforation GI bleeds gastritis esophagitis. IV PPI therapy.    DVT prophylaxis:  Heparin Code Status:  Full code    04/26/2022    5:44 PM  Advanced Directives  Does Patient Have a Medical Advance Directive? Yes  Type of Advance Directive Healthcare Power of Attorney  Does patient want to make changes to medical advance directive? No - Patient declined   Copy of Healthcare Power of Attorney in Chart? Yes - validated most recent copy scanned in chart (See row information)    Family Communication:  None Emergency Contact: Contact Information     Name Relation Home Work Mobile   gattis,brownynn Daughter   [phone]   Ronnette Juniper Daughter [phone]     Loth,donald Son   [phone]   Amelie, Caracci Daughter   [phone]       Disposition Plan:  To be determined Consults: Neurology Admission status: Inpatient Unit / Expected LOS: Med telemetry  Gertha Calkin MD [facility]  6 PM- 2 AM. [phone]( Pager )  For questions regarding this patient please use [email or web address] to contact the current [facility] MD.   Bonita Quin may also call [phone] to contact current Assigned [facility] Attending/Consulting MD for this patient.

## 2022-04-26 NOTE — ED Notes (Signed)
Code  stroke  called  [facility]  [facility] 

## 2022-04-26 NOTE — ED Triage Notes (Signed)
Pt arrives via [facility] from home w/ c/o AMS that started this am noticed by family. Ems reports pt has dementia at baseline and some cognitive disability but is normally able to answer questions appropriately - today she was unable to identify family, didn't know where she was, didn't know the time. VSS

## 2022-04-26 NOTE — ED Notes (Signed)
ED TO INPATIENT HANDOFF REPORT  ED Nurse Name and Phone #: Fleet Contras [phone]  S Name/Age/Gender [name] 77 y.o. female Room/Bed: ED15A/ED15A  Code Status   Code Status: Full Code  Home/SNF/Other Rehab Patient oriented to: self and place Is this baseline? Yes   Triage Complete: Triage complete  Chief Complaint Aphasia [R47.01]  Triage Note Pt arrives via [facility] from home w/ c/o AMS that started this am noticed by family. Ems reports pt has dementia at baseline and some cognitive disability but is normally able to answer questions appropriately - today she was unable to identify family, didn't know where she was, didn't know the time. VSS   Allergies Allergies  Allergen Reactions   Nsaids Other (See Comments)    PERFORATED ULCER    Level of Care/Admitting Diagnosis ED Disposition     ED Disposition  Admit   Condition  --   Comment  Hospital Area: [facility] [[id number]]  Level of Care: Telemetry Medical [104]  Covid Evaluation: Asymptomatic - no recent exposure (last 10 days) testing not required  Diagnosis: Aphasia Agafon.Baumgarten.3.ICD-9-CM]  Admitting Physician: Darrold Junker  Attending Physician: Darrold Junker          B Medical/Surgery History Past Medical History:  Diagnosis Date   Blood transfusion without reported diagnosis    Depression    Neuromuscular disorder    Past Surgical History:  Procedure Laterality Date   BRAIN SURGERY     LAPAROSCOPY N/A 01/10/2020   Procedure: LAPAROSCOPY DIAGNOSTIC laparoscopic washout omental patch splenectomy;  Surgeon: Karie Soda, MD;  Location: [facility];  Service: General;  Laterality: N/A;   LAPAROTOMY N/A 01/10/2020   Procedure: EXPLORATORY LAPAROTOMY;  Surgeon: Karie Soda, MD;  Location: [facility];  Service: General;  Laterality: N/A;   PACEMAKER IMPLANT N/A 05/25/2021   Procedure: PACEMAKER IMPLANT;  Surgeon: Marcina Millard, MD;  Location: [facility] INVASIVE CV LAB;   Service: Cardiovascular;  Laterality: N/A;   TUBAL LIGATION       A IV Location/Drains/Wounds Patient Lines/Drains/Airways Status     Active Line/Drains/Airways     Name Placement date Placement time Site Days   Peripheral IV 04/26/22 20 G 1" Anterior;Left Forearm 04/26/22  1702  Forearm  less than 1   Incision - 4 Ports Abdomen 1: Umbilicus 2: Superior;Umbilicus 3: Left;Umbilicus;Distal 4: Right;Distal;Umbilicus 01/10/20  1424  -- 837   Wound / Incision (Open or Dehisced) 01/13/20 Skin tear Wrist Left 01/13/20  0800  Wrist  834            Intake/Output Last 24 hours No intake or output data in the 24 hours ending 04/26/22 2004  Labs/Imaging Results for orders placed or performed during the hospital encounter of 04/26/22 (from the past 48 hour(s))  Ethanol     Status: None   Collection Time: 04/26/22  4:55 PM  Result Value Ref Range   Alcohol, Ethyl (B) <10 <10 mg/dL    Comment: (NOTE) Lowest detectable limit for serum alcohol is 10 mg/dL.  For medical purposes only. Performed at [facility], [address] [place], [place] [address]   Protime-INR     Status: None   Collection Time: 04/26/22  4:55 PM  Result Value Ref Range   Prothrombin Time 13.9 11.4 - 15.2 seconds   INR 1.1 0.8 - 1.2    Comment: (NOTE) INR goal varies based on device and disease states. Performed at [facility], [address] [place],  [place] [address]   APTT     Status: None   Collection Time: 04/26/22  4:55 PM  Result Value Ref Range   aPTT 28 24 - 36 seconds    Comment: Performed at [facility], [address] [place], [place] [address]  CBC     Status: None   Collection Time: 04/26/22  4:55 PM  Result Value Ref Range   WBC 7.3 4.0 - 10.5 K/uL   RBC 5.11 3.87 - 5.11 MIL/uL   Hemoglobin 13.9 12.0 - 15.0 g/dL   HCT 57.8 46.9 - 62.9 %   MCV 81.8 80.0 - 100.0 fL   MCH 27.2 26.0 - 34.0 pg   MCHC 33.3 30.0 - 36.0 g/dL   RDW 52.8 41.3 - 24.4 %    Platelets 297 150 - 400 K/uL   nRBC 0.0 0.0 - 0.2 %    Comment: Performed at [facility], [address] [place], [place] [address]  Differential     Status: None   Collection Time: 04/26/22  4:55 PM  Result Value Ref Range   Neutrophils Relative % 67 %   Neutro Abs 5.0 1.7 - 7.7 K/uL   Lymphocytes Relative 27 %   Lymphs Abs 2.0 0.7 - 4.0 K/uL   Monocytes Relative 4 %   Monocytes Absolute 0.3 0.1 - 1.0 K/uL   Eosinophils Relative 1 %   Eosinophils Absolute 0.0 0.0 - 0.5 K/uL   Basophils Relative 1 %   Basophils Absolute 0.0 0.0 - 0.1 K/uL   Immature Granulocytes 0 %   Abs Immature Granulocytes 0.02 0.00 - 0.07 K/uL    Comment: Performed at [facility], [address] [place], [place] [address]  Comprehensive metabolic panel     Status: Abnormal   Collection Time: 04/26/22  4:55 PM  Result Value Ref Range   Sodium 135 135 - 145 mmol/L   Potassium 3.9 3.5 - 5.1 mmol/L   Chloride 107 98 - 111 mmol/L   CO2 20 (L) 22 - 32 mmol/L   Glucose, Bld 208 (H) 70 - 99 mg/dL    Comment: Glucose reference range applies only to samples taken after fasting for at least 8 hours.   BUN 12 8 - 23 mg/dL   Creatinine, Ser 4.40 (H) 0.44 - 1.00 mg/dL   Calcium 9.4 8.9 - 34.7 mg/dL   Total Protein 7.5 6.5 - 8.1 g/dL   Albumin 4.1 3.5 - 5.0 g/dL   AST 23 15 - 41 U/L   ALT 13 0 - 44 U/L   Alkaline Phosphatase 91 38 - 126 U/L   Total Bilirubin 0.7 0.3 - 1.2 mg/dL   GFR, Estimated 55 (L) >60 mL/min    Comment: (NOTE) Calculated using the CKD-EPI Creatinine Equation (2021)    Anion gap 8 5 - 15    Comment: Performed at [facility], [address] [place], [place] [address]  CBG monitoring, ED     Status: Abnormal   Collection Time: 04/26/22  4:56 PM  Result Value Ref Range   Glucose-Capillary 197 (H) 70 - 99 mg/dL    Comment: Glucose reference range applies only to samples taken after fasting for at least 8 hours.  Urine Drug Screen, Qualitative     Status:  Abnormal   Collection Time: 04/26/22  5:42 PM  Result Value Ref Range   Tricyclic, Ur Screen NONE DETECTED NONE DETECTED   Amphetamines, Ur Screen NONE DETECTED NONE DETECTED   MDMA (Ecstasy)Ur  Screen NONE DETECTED NONE DETECTED   Cocaine Metabolite,Ur [place] NONE DETECTED NONE DETECTED   Opiate, Ur Screen POSITIVE (A) NONE DETECTED   Phencyclidine (PCP) Ur S NONE DETECTED NONE DETECTED   Cannabinoid 50 Ng, Ur [place] NONE DETECTED NONE DETECTED   Barbiturates, Ur Screen NONE DETECTED NONE DETECTED   Benzodiazepine, Ur Scrn NONE DETECTED NONE DETECTED   Methadone Scn, Ur NONE DETECTED NONE DETECTED    Comment: (NOTE) Tricyclics + metabolites, urine    Cutoff 1000 ng/mL Amphetamines + metabolites, urine  Cutoff 1000 ng/mL MDMA (Ecstasy), urine              Cutoff 500 ng/mL Cocaine Metabolite, urine          Cutoff 300 ng/mL Opiate + metabolites, urine        Cutoff 300 ng/mL Phencyclidine (PCP), urine         Cutoff 25 ng/mL Cannabinoid, urine                 Cutoff 50 ng/mL Barbiturates + metabolites, urine  Cutoff 200 ng/mL Benzodiazepine, urine              Cutoff 200 ng/mL Methadone, urine                   Cutoff 300 ng/mL  The urine drug screen provides only a preliminary, unconfirmed analytical test result and should not be used for non-medical purposes. Clinical consideration and professional judgment should be applied to any positive drug screen result due to possible interfering substances. A more specific alternate chemical method must be used in order to obtain a confirmed analytical result. Gas chromatography / mass spectrometry (GC/MS) is the preferred confirm atory method. Performed at [facility], [address] [place], [place] [address]   Urinalysis, Routine w reflex microscopic -Urine, Clean Catch     Status: Abnormal   Collection Time: 04/26/22  5:42 PM  Result Value Ref Range   Color, Urine YELLOW (A) YELLOW   APPearance CLEAR (A) CLEAR   Specific  Gravity, Urine 1.040 (H) 1.005 - 1.030   pH 5.0 5.0 - 8.0   Glucose, UA NEGATIVE NEGATIVE mg/dL   Hgb urine dipstick NEGATIVE NEGATIVE   Bilirubin Urine NEGATIVE NEGATIVE   Ketones, ur NEGATIVE NEGATIVE mg/dL   Protein, ur NEGATIVE NEGATIVE mg/dL   Nitrite NEGATIVE NEGATIVE   Leukocytes,Ua NEGATIVE NEGATIVE    Comment: Performed at [facility], [address] [place], [place] [address]   CT ANGIO HEAD NECK W WO CM (CODE STROKE)  Result Date: 04/26/2022 CLINICAL DATA:  Aphasia EXAM: CT ANGIOGRAPHY HEAD AND NECK WITH AND WITHOUT CONTRAST TECHNIQUE: Multidetector CT imaging of the head and neck was performed using the standard protocol during bolus administration of intravenous contrast. Multiplanar CT image reconstructions and MIPs were obtained to evaluate the vascular anatomy. Carotid stenosis measurements (when applicable) are obtained utilizing NASCET criteria, using the distal internal carotid diameter as the denominator. RADIATION DOSE REDUCTION: This exam was performed according to the departmental dose-optimization program which includes automated exposure control, adjustment of the mA and/or kV according to patient size and/or use of iterative reconstruction technique. CONTRAST:  75mL OMNIPAQUE IOHEXOL 350 MG/ML SOLN COMPARISON:  No prior CTA available, correlation is made with MRA head 05/08/2020 and CT head 04/26/2022 FINDINGS: CT HEAD FINDINGS For noncontrast findings, please see same day CT head. CTA NECK FINDINGS Aortic arch: Two-vessel arch with a common origin of the brachiocephalic and left common carotid arteries.  Imaged portion shows no evidence of aneurysm or dissection. No significant stenosis of the major arch vessel origins. Right carotid system: No evidence of dissection, occlusion, or hemodynamically significant stenosis (greater than 50%). Atherosclerotic disease at the bifurcation and in the proximal ICA is not hemodynamically significant. Left carotid system: No  evidence of dissection, occlusion, or hemodynamically significant stenosis (greater than 50%). Atherosclerotic disease at the bifurcation and in the proximal ICA is not hemodynamically significant. Vertebral arteries: No evidence of dissection, occlusion, or hemodynamically significant stenosis (greater than 50%). Skeleton: No acute osseous abnormality. Degenerative changes in the cervical spine. Other neck: Negative. Upper chest: Bronchial wall thickening. Emphysema. No focal pulmonary opacity or pleural effusion. Review of the MIP images confirms the above findings CTA HEAD FINDINGS Evaluation is somewhat limited by motion artifact. Anterior circulation: Both internal carotid arteries are patent to the termini, with severe stenosis in the bilateral cavernous segments. A1 segments patent. Normal anterior communicating artery. Anterior cerebral arteries are patent to their distal aspects without significant stenosis. No M1 stenosis or occlusion. MCA are grossly perfused to their distal aspects, although evaluation is limited by motion. In addition, the right MCA branches appear somewhat less opacified compared to the left MCA branches. Posterior circulation: Vertebral arteries patent to the vertebrobasilar junction without significant stenosis. Posterior inferior cerebellar arteries patent proximally. Basilar patent to its distal aspect without significant stenosis. Superior cerebellar arteries patent proximally. Patent right P1. Fetal origin the left PCA from the left posterior communicating artery. PCAs perfused to their distal aspects without significant stenosis. Right posterior communicating artery is not visualized. Venous sinuses: Not well evaluated due to phase of timing. Anatomic variants: Fetal origin the left PCA. Review of the MIP images confirms the above findings IMPRESSION: 1. Evaluation of the intracranial vasculature is somewhat limited by motion artifact. Within this limitation, no intracranial  large vessel occlusion. 2. 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 3. No hemodynamically significant stenosis in the neck. Impression #1 was discussed by telephone on 04/26/2022 at 5:30 pm with provider COLLEEN STACK . Electronically Signed   By: Wiliam Ke M.D.   On: 04/26/2022 17:41   CT HEAD CODE STROKE WO CONTRAST  Result Date: 04/26/2022 CLINICAL DATA:  Code stroke. Neuro deficit, acute, stroke suspected. Dementia. EXAM: CT HEAD WITHOUT CONTRAST TECHNIQUE: Contiguous axial images were obtained from the base of the skull through the vertex without intravenous contrast. RADIATION DOSE REDUCTION: This exam was performed according to the departmental dose-optimization program which includes automated exposure control, adjustment of the mA and/or kV according to patient size and/or use of iterative reconstruction technique. COMPARISON:  03/06/2022 FINDINGS: Brain: No focal abnormality seen affecting the brainstem or cerebellum. Old left occipital stroke as seen previously. Chronic small-vessel ischemic changes of the cerebral hemispheric white matter. No sign of acute infarction, mass lesion, hemorrhage, hydrocephalus or extra-axial collection. Vascular: There is atherosclerotic calcification of the major vessels at the base of the brain. Skull: Negative Sinuses/Orbits: Clear/normal Other: Some fluid in the mastoid air cells and middle ears. ASPECTS [facility] Stroke Program Early CT Score) - Ganglionic level infarction (caudate, lentiform nuclei, internal capsule, insula, M1-M3 cortex): 7 - Supraganglionic infarction (M4-M6 cortex): 3 Total score (0-10 with 10 being normal): 10 IMPRESSION: 1. No acute CT finding. Old left occipital stroke. Chronic small-vessel ischemic changes of the white matter. 2. Aspects is 10. These results were communicated to Dr. Selina Cooley at 5:20 pm on 04/26/2022 by text page via the [facility] messaging system. Electronically Signed   By:  Paulina Fusi M.D.   On: 04/26/2022 17:21     Pending Labs Unresulted Labs (From admission, onward)     Start     Ordered   04/27/22 0500  Lipid panel  (Labs)  Tomorrow morning,   URGENT       Comments: Fasting    04/26/22 1913   04/26/22 1911  Hemoglobin A1c  (Labs)  Once,   URGENT       Comments: To assess prior glycemic control    04/26/22 1913            Vitals/Pain Today's Vitals   04/26/22 1743 04/26/22 1804 04/26/22 1845 04/26/22 1907  BP:  (!) 122/109  (!) 150/67  Pulse:   61 (!) 57  Resp:   17 (!) 21  Temp:      TempSrc:      SpO2:   96% 97%  Weight: 62.6 kg     Height: 5\' 4"  (1.626 m)       Isolation Precautions No active isolations  Medications Medications  clopidogrel (PLAVIX) tablet 75 mg (75 mg Oral Given 04/26/22 1823)  albuterol (PROVENTIL) (2.5 MG/3ML) 0.083% nebulizer solution 3 mL (has no administration in time range)  donepezil (ARICEPT) tablet 10 mg (has no administration in time range)  atorvastatin (LIPITOR) tablet 40 mg (has no administration in time range)  memantine (NAMENDA) tablet 5 mg (has no administration in time range)  sertraline (ZOLOFT) tablet 25 mg (has no administration in time range)   stroke: early stages of recovery book (has no administration in time range)  0.9 %  sodium chloride infusion (has no administration in time range)  acetaminophen (TYLENOL) tablet 650 mg (has no administration in time range)    Or  acetaminophen (TYLENOL) 160 MG/5ML solution 650 mg (has no administration in time range)    Or  acetaminophen (TYLENOL) suppository 650 mg (has no administration in time range)  heparin injection 5,000 Units (has no administration in time range)  iohexol (OMNIPAQUE) 350 MG/ML injection 75 mL (75 mLs Intravenous Contrast Given 04/26/22 1719)    Mobility walks with device       R Recommendations: See Admitting Provider Note  Report given to:   Additional Notes: Currently waiting for MRI, patient has pacemaker.

## 2022-04-26 NOTE — Consult Note (Addendum)
NEUROLOGY TELECONSULTATION NOTE   Date of service: April 26, 2022 Patient Name: Tracy Barton MRN:  [id number] DOB:  August 19, 1945 Reason for consult: aphasia  Requesting Provider: Dr. Donna Bernard Consult Participants: myself, patient, bedside RN, telestroke RN Location of the provider: [facility], [place] Location of the patient: [facility]  This consult was provided via telemedicine with 2-way video and audio communication. The patient/family was informed that care would be provided in this way and agreed to receive care in this manner.   _ _ _   _ __   _ __ _ _  __ __   _ __   __ _  History of Present Illness   This is a 77 yo woman with hx depression, neuromuscular disorder NOS who presents with speech problems. LKW 1000 when she went to take a nap. When she woke up family member noticed she was slurring her speech and mixing up her words "word salad." NIHSS = 7 for mild dysarthria, severe aphasia, and chronic visual field deficit. Oriented only to self. Head CT showed chronic L occipital infarct. Patient was not a TNK candidate 2/2 presentation outside the window. 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   ROS   UTA 2/2 aphasia  Past History   The following was personally reviewed:  Past Medical History:  Diagnosis Date   Blood transfusion without reported diagnosis    Depression    Neuromuscular disorder (HCC)    Past Surgical History:  Procedure Laterality Date   BRAIN SURGERY     LAPAROSCOPY N/A 01/10/2020   Procedure: LAPAROSCOPY DIAGNOSTIC laparoscopic washout omental patch splenectomy;  Surgeon: Karie Soda, MD;  Location: [facility];  Service: General;  Laterality: N/A;   LAPAROTOMY N/A 01/10/2020   Procedure: EXPLORATORY LAPAROTOMY;  Surgeon: Karie Soda, MD;  Location: [facility];  Service: General;  Laterality: N/A;   PACEMAKER IMPLANT N/A 05/25/2021   Procedure:  PACEMAKER IMPLANT;  Surgeon: Marcina Millard, MD;  Location: [facility]VASIVE CV [facility];  Service: Cardiovascular;  Laterality: N/A;   TUBAL LIGATION     Family History  Problem Relation Age of Onset   Stroke Mother    Cancer Mother    Cancer Brother    Mental illness Maternal Grandmother    Social History   Socioeconomic History   Marital status: Divorced    Spouse name: Not on file   Number of children: Not on file   Years of education: Not on file   Highest education level: Not on file  Occupational History   Not on file  Tobacco Use   Smoking status: Former    Packs/day: 0.50    Years: 53.00    Additional pack years: 0.00    Total pack years: 26.50    Types: Cigarettes    Quit date: 05/2021    Years since quitting: 0.9   Smokeless tobacco: Never  Vaping Use   Vaping Use: Never used  Substance and Sexual Activity   Alcohol use: Yes    Alcohol/week: 2.0 standard drinks of alcohol    Types: 2 Cans of beer per week   Drug use: No   Sexual activity: Not on file  Other Topics Concern   Not on file  Social History Narrative   Not on file   Social Determinants of Health   Financial Resource Strain: Not on file  Food Insecurity: Not on file  Transportation Needs: Not on file  Physical Activity: Not on file  Stress: Not on file  Social Connections: Not on file   Allergies  Allergen Reactions   Nsaids Other (See Comments)    PERFORATED ULCER    Medications   (Not in a hospital admission)    No current facility-administered medications for this encounter.  Current Outpatient Medications:    potassium chloride (KLOR-CON) 10 MEQ tablet, Take 1 tablet (10 mEq total) by mouth daily., Disp: 90 tablet, Rfl: 3   albuterol (VENTOLIN HFA) 108 (90 Base) MCG/ACT inhaler, Inhale 1-2 puffs into the lungs every 6 (six) hours as needed., Disp: 8 g, Rfl: 2   alendronate (FOSAMAX) 70 MG tablet, Take 70 mg by mouth once a week., Disp: , Rfl:    atorvastatin (LIPITOR) 40 MG  tablet, Take 1 tablet (40 mg total) by mouth at bedtime., Disp: 30 tablet, Rfl: 0   benzonatate (TESSALON) 200 MG capsule, Take 1 capsule (200 mg total) by mouth 3 (three) times daily as needed., Disp: 45 capsule, Rfl: 1   celecoxib (CELEBREX) 200 MG capsule, Take 1 capsule by mouth daily. (Patient not taking: Reported on 02/24/2022), Disp: , Rfl:    donepezil (ARICEPT) 10 MG tablet, Take 10 mg by mouth at bedtime., Disp: , Rfl:    esomeprazole (NEXIUM) 40 MG capsule, Take 1 capsule (40 mg total) by mouth daily. (Patient not taking: Reported on 01/30/2022), Disp: 30 capsule, Rfl: 3   Fluticasone-Umeclidin-Vilant (TRELEGY ELLIPTA) 100-62.5-25 MCG/ACT AEPB, Inhale 1 puff into the lungs daily. (Patient not taking: Reported on 02/24/2022), Disp: 14 each, Rfl: 0   gabapentin (NEURONTIN) 100 MG capsule, TAKE 1 CAPSULE BY MOUTH AT BEDTIME, Disp: 30 capsule, Rfl: 2   guaiFENesin-codeine (ROBITUSSIN AC) 100-10 MG/5ML syrup, Take 5 mLs by mouth 3 (three) times daily as needed for cough. (Patient not taking: Reported on 01/30/2022), Disp: 180 mL, Rfl: 0   ipratropium (ATROVENT) 0.03 % nasal spray, Place 2 sprays into both nostrils every 12 (twelve) hours. (Patient not taking: Reported on 01/30/2022), Disp: 30 mL, Rfl: 12   memantine (NAMENDA) 5 MG tablet, Take 5 mg by mouth 2 (two) times daily., Disp: , Rfl:    predniSONE (DELTASONE) 20 MG tablet, Take 1 tablet (20 mg total) by mouth daily with breakfast., Disp: 5 tablet, Rfl: 0   promethazine-dextromethorphan (PROMETHAZINE-DM) 6.25-15 MG/5ML syrup, Take 5 mLs by mouth 4 (four) times daily as needed for cough., Disp: 240 mL, Rfl: 0   sertraline (ZOLOFT) 25 MG tablet, Take 25 mg by mouth daily., Disp: , Rfl:    thiamine (VITAMIN B1) 100 MG tablet, Take by mouth. Take 1 tablet (100 mg total) by mouth once daily, Disp: , Rfl:    traMADol (ULTRAM) 50 MG tablet, Take 50 mg by mouth every 12 (twelve) hours as needed., Disp: , Rfl:    traZODone (DESYREL) 50 MG tablet, Take  50-100 mg by mouth at bedtime as needed for sleep., Disp: , Rfl:   Vitals   Vitals:   04/26/22 1651  BP: 139/88  Pulse: 71  Resp: 17  Temp: 98.5 F (36.9 C)  TempSrc: Oral  SpO2: 95%     There is no height or weight on file to calculate BMI.  Physical Exam   Exam performed over telemedicine with 2-way video and audio communication and with assistance of bedside RN  Physical Exam Gen: alert, oriented to self only Resp: normal WOB CV: extremities appear well-perfused  Neuro: *MS: alert, oriented to self  only *Speech: mild dysarthria, severe aphasia, impaired naming and repetition *CN: PERRL 3mm, EOMI, blinks to threat on L but not R, sensation intact, smile symmetric, hearing intact to voice *Motor:   Normal bulk.  No tremor, rigidity or bradykinesia. No drift in any extremity. *Sensory: SILT. Symmetric. No double-simultaneous extinction.  *Coordination:  UTA 2/2 inability to follow commands *Reflexes:  UTA 2/2 tele-exam *Gait: deferred  NIHSS  1a Level of Conscious.: 0 1b LOC Questions: 2 1c LOC Commands: 1 2 Best Gaze: 0 3 Visual: 1 4 Facial Palsy: 0 5a Motor Arm - left: 0 5b Motor Arm - Right: 0 6a Motor Leg - Left: 0 6b Motor Leg - Right: 0 7 Limb Ataxia: 0 8 Sensory: 0 9 Best Language: 2 10 Dysarthria: 1 11 Extinct. and Inatten.: 0  TOTAL: 7   Premorbid mRS = 2   Labs   CBC: No results for input(s): "WBC", "NEUTROABS", "HGB", "HCT", "MCV", "PLT" in the last 168 hours.  Basic Metabolic Panel:  Lab Results  Component Value Date   NA 139 03/06/2022   K 3.5 03/06/2022   CO2 25 03/06/2022   GLUCOSE 120 (H) 03/06/2022   BUN 12 03/06/2022   CREATININE 1.11 (H) 03/06/2022   CALCIUM 8.8 (L) 03/06/2022   GFRNONAA 51 (L) 03/06/2022   GFRAA 54 (L) 03/08/2019   Lipid Panel:  Lab Results  Component Value Date   LDLCALC 138 (H) 05/09/2020   HgbA1c:  Lab Results  Component Value Date   HGBA1C 5.7 (H) 05/09/2020   Urine Drug Screen:      Component Value Date/Time   LABOPIA NONE DETECTED 02/01/2020 0545   COCAINSCRNUR NONE DETECTED 02/01/2020 0545   LABBENZ NONE DETECTED 02/01/2020 0545   AMPHETMU NONE DETECTED 02/01/2020 0545   THCU NONE DETECTED 02/01/2020 0545   LABBARB NONE DETECTED 02/01/2020 0545    Alcohol Level     Component Value Date/Time   ETH <10 02/01/2020 0117    CT Head without contrast: Old L occipital infarct, no acute process on personal review  CT angio Head and Neck with contrast: 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 Patient was not a TNK candidate 2/2 presentation outside the window. 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  Recommendations   - Admit for stroke workup - Permissive HTN x48 hrs from sx onset or until stroke ruled out by MRI goal BP <220/110. PRN labetalol or hydralazine if BP above these parameters. Avoid oral antihypertensives. - MRI brain wo contrast - CTA already obtained - TTE  - Check A1c and LDL + add statin per guidelines - Plavix  daily - Please clarify patient's allergy to NSAIDs and add aspirin  daily if appropriate - q4 hr neuro checks - STAT head CT for any change in neuro exam - Tele - PT/OT/SLP - Stroke education - Amb referral to neurology upon discharge   Neurology will continue to follow  ______________________________________________________________________   Thank you for the opportunity to take part in the care of this patient. If you have any further questions, please contact the neurology consultation  attending.  Signed,  Su Monks, MD [facility] [phone]  If 7pm- 7am, please page neurology on call as listed in [place].  **Any copied and pasted documentation in this note was written by me in another application not billed for and pasted by me into this document.

## 2022-04-26 NOTE — ED Provider Notes (Signed)
[facility] Provider Note   Event Date/Time   First MD Initiated Contact with Patient 04/26/22 1652     (approximate) History  Altered Mental Status  HPI Tracy Barton is a 77 y.o. female with reported past medical history of mild cognitive disability, hypertension, hepatitis C, and previous CVA presents via EMS for altered mental status.  Patient patient replies "no" when asked if anything is bothering her right now.  Patient is unable to answer in complete sentences or comprehensible speech.  I spoke to patient's daughter on the phone prior to her arrival who stated that patient's last known well was at 10 AM before a nap after she had been up all night moaning.  Upon awakening patient has been having this slurred speech and unable to answer questions that are out of the ordinary for her.  According to daughter patient does have some mild cognitive impairment at baseline but is able to answer questions and is oriented x 3. ROS: Unable to assess   Physical Exam  Triage Vital Signs: ED Triage Vitals [04/26/22 1651]  Enc Vitals Group     BP 139/88     Pulse Rate 71     Resp 17     Temp 98.5 F (36.9 C)     Temp Source Oral     SpO2 95 %     Weight      Height      Head Circumference      Peak Flow      Pain Score      Pain Loc      Pain Edu?      Excl. in GC?    Most recent vital signs: Vitals:   04/26/22 1907 04/26/22 2046  BP: (!) 150/67 138/85  Pulse: (!) 57 63  Resp: (!) 21 19  Temp:  98.8 F (37.1 C)  SpO2: 97% 98%   General: Awake, oriented x4. CV:  Good peripheral perfusion.  Resp:  Normal effort.  Abd:  No distention.  Other:  Elderly overweight African-American female laying in bed.  Dysarthric, visual field cut.  NIHSS 7 ED Results / Procedures / Treatments  Labs (all labs ordered are listed, but only abnormal results are displayed) Labs Reviewed  COMPREHENSIVE METABOLIC PANEL - Abnormal; Notable for the following components:       Result Value   CO2 20 (*)    Glucose, Bld 208 (*)    Creatinine, Ser 1.04 (*)    GFR, Estimated 55 (*)    All other components within normal limits  URINE DRUG SCREEN, QUALITATIVE ([facility] ONLY) - Abnormal; Notable for the following components:   Opiate, Ur Screen POSITIVE (*)    All other components within normal limits  URINALYSIS, ROUTINE W REFLEX MICROSCOPIC - Abnormal; Notable for the following components:   Color, Urine YELLOW (*)    APPearance CLEAR (*)    Specific Gravity, Urine 1.040 (*)    All other components within normal limits  CBG MONITORING, ED - Abnormal; Notable for the following components:   Glucose-Capillary 197 (*)    All other components within normal limits  ETHANOL  PROTIME-INR  APTT  CBC  DIFFERENTIAL  LIPID PANEL  HEMOGLOBIN A1C   EKG ED ECG REPORT I, Merwyn Katos, the attending physician, personally viewed and interpreted this ECG. Date: 04/26/2022 EKG Time: 1649 Rate: 80 Rhythm: normal sinus rhythm QRS Axis: normal Intervals: normal ST/T Wave abnormalities: normal Narrative Interpretation: no evidence of acute  ischemia RADIOLOGY ED MD interpretation: CT angiography of the head and neck does not show any evidence of acute abnormalities  CT of the head without contrast interpreted by me shows no evidence of acute abnormalities including no intracerebral hemorrhage, obvious masses, or significant edema -Agree with radiology assessment Official radiology report(s): CT ANGIO HEAD NECK W WO CM (CODE STROKE)  Result Date: 04/26/2022 CLINICAL DATA:  Aphasia EXAM: CT ANGIOGRAPHY HEAD AND NECK WITH AND WITHOUT CONTRAST TECHNIQUE: Multidetector CT imaging of the head and neck was performed using the standard protocol during bolus administration of intravenous contrast. Multiplanar CT image reconstructions and MIPs were obtained to evaluate the vascular anatomy. Carotid stenosis measurements (when applicable) are obtained utilizing NASCET criteria,  using the distal internal carotid diameter as the denominator. RADIATION DOSE REDUCTION: This exam was performed according to the departmental dose-optimization program which includes automated exposure control, adjustment of the mA and/or kV according to patient size and/or use of iterative reconstruction technique. CONTRAST:  75mL OMNIPAQUE IOHEXOL 350 MG/ML SOLN COMPARISON:  No prior CTA available, correlation is made with MRA head 05/08/2020 and CT head 04/26/2022 FINDINGS: CT HEAD FINDINGS For noncontrast findings, please see same day CT head. CTA NECK FINDINGS Aortic arch: Two-vessel arch with a common origin of the brachiocephalic and left common carotid arteries. Imaged portion shows no evidence of aneurysm or dissection. No significant stenosis of the major arch vessel origins. Right carotid system: No evidence of dissection, occlusion, or hemodynamically significant stenosis (greater than 50%). Atherosclerotic disease at the bifurcation and in the proximal ICA is not hemodynamically significant. Left carotid system: No evidence of dissection, occlusion, or hemodynamically significant stenosis (greater than 50%). Atherosclerotic disease at the bifurcation and in the proximal ICA is not hemodynamically significant. Vertebral arteries: No evidence of dissection, occlusion, or hemodynamically significant stenosis (greater than 50%). Skeleton: No acute osseous abnormality. Degenerative changes in the cervical spine. Other neck: Negative. Upper chest: Bronchial wall thickening. Emphysema. No focal pulmonary opacity or pleural effusion. Review of the MIP images confirms the above findings CTA HEAD FINDINGS Evaluation is somewhat limited by motion artifact. Anterior circulation: Both internal carotid arteries are patent to the termini, with severe stenosis in the bilateral cavernous segments. A1 segments patent. Normal anterior communicating artery. Anterior cerebral arteries are patent to their distal aspects  without significant stenosis. No M1 stenosis or occlusion. MCA are grossly perfused to their distal aspects, although evaluation is limited by motion. In addition, the right MCA branches appear somewhat less opacified compared to the left MCA branches. Posterior circulation: Vertebral arteries patent to the vertebrobasilar junction without significant stenosis. Posterior inferior cerebellar arteries patent proximally. Basilar patent to its distal aspect without significant stenosis. Superior cerebellar arteries patent proximally. Patent right P1. Fetal origin the left PCA from the left posterior communicating artery. PCAs perfused to their distal aspects without significant stenosis. Right posterior communicating artery is not visualized. Venous sinuses: Not well evaluated due to phase of timing. Anatomic variants: Fetal origin the left PCA. Review of the MIP images confirms the above findings IMPRESSION: 1. Evaluation of the intracranial vasculature is somewhat limited by motion artifact. Within this limitation, no intracranial large vessel occlusion. 2. 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 3. No hemodynamically significant stenosis in the neck. Impression #1 was discussed by telephone on 04/26/2022 at 5:30 pm with provider COLLEEN STACK . Electronically Signed   By: Wiliam Ke M.D.   On: 04/26/2022 17:41   CT HEAD CODE STROKE WO CONTRAST  Result Date:  04/26/2022 CLINICAL DATA:  Code stroke. Neuro deficit, acute, stroke suspected. Dementia. EXAM: CT HEAD WITHOUT CONTRAST TECHNIQUE: Contiguous axial images were obtained from the base of the skull through the vertex without intravenous contrast. RADIATION DOSE REDUCTION: This exam was performed according to the departmental dose-optimization program which includes automated exposure control, adjustment of the mA and/or kV according to patient size and/or use of iterative reconstruction technique. COMPARISON:  03/06/2022 FINDINGS: Brain: No focal  abnormality seen affecting the brainstem or cerebellum. Old left occipital stroke as seen previously. Chronic small-vessel ischemic changes of the cerebral hemispheric white matter. No sign of acute infarction, mass lesion, hemorrhage, hydrocephalus or extra-axial collection. Vascular: There is atherosclerotic calcification of the major vessels at the base of the brain. Skull: Negative Sinuses/Orbits: Clear/normal Other: Some fluid in the mastoid air cells and middle ears. ASPECTS [facility] Stroke Program Early CT Score) - Ganglionic level infarction (caudate, lentiform nuclei, internal capsule, insula, M1-M3 cortex): 7 - Supraganglionic infarction (M4-M6 cortex): 3 Total score (0-10 with 10 being normal): 10 IMPRESSION: 1. No acute CT finding. Old left occipital stroke. Chronic small-vessel ischemic changes of the white matter. 2. Aspects is 10. These results were communicated to Dr. Selina Cooley at 5:20 pm on 04/26/2022 by text page via the [facility] messaging system. Electronically Signed   By: Paulina Fusi M.D.   On: 04/26/2022 17:21   PROCEDURES: Critical Care performed: Yes, see critical care procedure note(s) .1-3 Lead EKG Interpretation  Performed by: Merwyn Katos, MD Authorized by: Merwyn Katos, MD     Interpretation: normal     ECG rate:  71   ECG rate assessment: normal     Rhythm: sinus rhythm     Ectopy: none     Conduction: normal   CRITICAL CARE Performed by: Merwyn Katos  Total critical care time: 35 minutes  Critical care time was exclusive of separately billable procedures and treating other patients.  Critical care was necessary to treat or prevent imminent or life-threatening deterioration.  Critical care was time spent personally by me on the following activities: development of treatment plan with patient and/or surrogate as well as nursing, discussions with consultants, evaluation of patient's response to treatment, examination of patient, obtaining history from patient or  surrogate, ordering and performing treatments and interventions, ordering and review of laboratory studies, ordering and review of radiographic studies, pulse oximetry and re-evaluation of patient's condition.  MEDICATIONS ORDERED IN ED: Medications  clopidogrel (PLAVIX) tablet 75 mg (75 mg Oral Given 04/26/22 1823)  albuterol (PROVENTIL) (2.5 MG/3ML) 0.083% nebulizer solution 3 mL (has no administration in time range)  donepezil (ARICEPT) tablet 10 mg (10 mg Oral Given 04/26/22 2111)  atorvastatin (LIPITOR) tablet 40 mg (40 mg Oral Given 04/26/22 2249)  memantine (NAMENDA) tablet 5 mg (5 mg Oral Given 04/26/22 2111)  sertraline (ZOLOFT) tablet 25 mg (has no administration in time range)  0.9 %  sodium chloride infusion ( Intravenous New Bag/Given 04/26/22 2130)  acetaminophen (TYLENOL) tablet 650 mg (has no administration in time range)    Or  acetaminophen (TYLENOL) 160 MG/5ML solution 650 mg (has no administration in time range)    Or  acetaminophen (TYLENOL) suppository 650 mg (has no administration in time range)  heparin injection 5,000 Units (5,000 Units Subcutaneous Given 04/26/22 2110)  iohexol (OMNIPAQUE) 350 MG/ML injection 75 mL (75 mLs Intravenous Contrast Given 04/26/22 1719)   stroke: early stages of recovery book ( Does not apply Given 04/26/22 2111)   IMPRESSION / MDM /  ASSESSMENT AND PLAN / ED COURSE  I reviewed the triage vital signs and the nursing notes.                             The patient is on the cardiac monitor to evaluate for evidence of arrhythmia and/or significant heart rate changes. Patient's presentation is most consistent with acute presentation with potential threat to life or bodily function. Stroke alert PMH risk factors: TIA Neurologic Deficits: Dysarthria, visual field cut Last known Well Time: 1000 NIH Stroke Score: 7 Given History and Exam I have lower suspicion for infectious etiology, neurologic changes secondary to toxicologic ingestion, seizure,  complex migraine. Presentation concerning for possible stroke requiring workup.  Workup: Labs: POC glucose, CBC, BMP, LFTs, Troponin, PT/INR, PTT, Type and Screen Other Diagnostics: ECG, CXR, non-contrast head CT followed by CTA brain and neck (to r/o large vessel occlusion amenable to thrombectomy) Interventions: Patient low on NIH scale and out of the window for tpa.  Consult: Neurology. Discussed with Dr. Selina Cooley regarding patients neurological symptoms and last well-known time and eligibility for TPA criteria. Disposition: Admission to medicine   FINAL CLINICAL IMPRESSION(S) / ED DIAGNOSES   Final diagnoses:  Altered mental status, unspecified altered mental status type  Dysarthria   Rx / DC Orders   ED Discharge Orders     None      Note:  This document was prepared using Dragon voice recognition software and may include unintentional dictation errors.   Merwyn Katos, MD 04/26/22 [phone]

## 2022-04-26 NOTE — Plan of Care (Signed)
  Problem: Education: Goal: Knowledge of disease or condition will improve Outcome: Progressing Goal: Knowledge of secondary prevention will improve (MUST DOCUMENT ALL) Outcome: Progressing   Problem: Ischemic Stroke/TIA Tissue Perfusion: Goal: Complications of ischemic stroke/TIA will be minimized Outcome: Progressing   Problem: Coping: Goal: Will verbalize positive feelings about self Outcome: Progressing   Problem: Health Behavior/Discharge Planning: Goal: Ability to manage health-related needs will improve Outcome: Progressing   Problem: Self-Care: Goal: Ability to participate in self-care as condition permits will improve Outcome: Progressing   Problem: Nutrition: Goal: Risk of aspiration will decrease Outcome: Progressing   Problem: Education: Goal: Knowledge of General Education information will improve Description: Including pain rating scale, medication(s)/side effects and non-pharmacologic comfort measures Outcome: Progressing   Problem: Activity: Goal: Risk for activity intolerance will decrease Outcome: Progressing   Problem: Nutrition: Goal: Adequate nutrition will be maintained Outcome: Progressing   Problem: Coping: Goal: Level of anxiety will decrease Outcome: Progressing   Problem: Elimination: Goal: Will not experience complications related to bowel motility Outcome: Progressing   Problem: Pain Managment: Goal: General experience of comfort will improve Outcome: Progressing   Problem: Safety: Goal: Ability to remain free from injury will improve Outcome: Progressing   Problem: Skin Integrity: Goal: Risk for impaired skin integrity will decrease Outcome: Progressing

## 2022-04-26 NOTE — ED Notes (Signed)
Vicente Males ED MD Spoke with Lynelle Doctor ED MD at [facility] cone concerning getting MRI performed for pt at [facility]. Pt has incompatable PM w/ MRI equipment at [facility].

## 2022-04-26 NOTE — Hospital Course (Signed)
LKN WAS 10 AM.

## 2022-04-26 NOTE — Progress Notes (Signed)
TELESTROKE NOTE:  El[place] 1657  Dr. Selina Cooley paged 1700 Pt to CT 1703 Dr. Selina Cooley on camera 1702  Per EDP, pts daughter states she went to bed at 1000 today and woke up around 1200 with dysarthria and word salad. LNW 1000.

## 2022-04-26 NOTE — Code Documentation (Signed)
CODE STROKE- PHARMACY COMMUNICATION   Time CODE STROKE called/page received:1701  Time response to CODE STROKE was made (in person or via phone): 1706  Time Stroke Kit retrieved from Pyxis (only if needed):N/A  Name of Provider/Nurse contacted:Dr. Selina Cooley  Past Medical History:  Diagnosis Date   Blood transfusion without reported diagnosis    Depression    Neuromuscular disorder (HCC)    Prior to Admission medications   Medication Sig Start Date End Date Taking? Authorizing Provider  potassium chloride (KLOR-CON) 10 MEQ tablet Take 1 tablet (10 mEq total) by mouth daily. 03/01/22   Duke Salvia, MD  albuterol (VENTOLIN HFA) 108 (90 Base) MCG/ACT inhaler Inhale 1-2 puffs into the lungs every 6 (six) hours as needed. 02/24/22   Parrett, Virgel Bouquet, NP  alendronate (FOSAMAX) 70 MG tablet Take 70 mg by mouth once a week. 10/21/21   [provider]  atorvastatin (LIPITOR) 40 MG tablet Take 1 tablet (40 mg total) by mouth at bedtime. 05/09/20   Glade Lloyd, MD  benzonatate (TESSALON) 200 MG capsule Take 1 capsule (200 mg total) by mouth 3 (three) times daily as needed. 02/24/22 02/24/23  Parrett, Virgel Bouquet, NP  celecoxib (CELEBREX) 200 MG capsule Take 1 capsule by mouth daily. Patient not taking: Reported on 02/24/2022 11/18/21   [provider]  donepezil (ARICEPT) 10 MG tablet Take 10 mg by mouth at bedtime.    [provider]  esomeprazole (NEXIUM) 40 MG capsule Take 1 capsule (40 mg total) by mouth daily. Patient not taking: Reported on 01/30/2022 12/13/21 04/12/22  Salena Saner, MD  Fluticasone-Umeclidin-Vilant (TRELEGY ELLIPTA) 100-62.5-25 MCG/ACT AEPB Inhale 1 puff into the lungs daily. Patient not taking: Reported on 02/24/2022 01/26/22   Salena Saner, MD  gabapentin (NEURONTIN) 100 MG capsule TAKE 1 CAPSULE BY MOUTH AT BEDTIME 04/25/22   Salena Saner, MD  guaiFENesin-codeine [facility]) 100-10 MG/5ML syrup Take 5 mLs by mouth 3 (three) times  daily as needed for cough. Patient not taking: Reported on 01/30/2022 01/26/22   Salena Saner, MD  ipratropium (ATROVENT) 0.03 % nasal spray Place 2 sprays into both nostrils every 12 (twelve) hours. Patient not taking: Reported on 01/30/2022 01/11/22   Glenford Bayley, NP  memantine (NAMENDA) 5 MG tablet Take 5 mg by mouth 2 (two) times daily.    [provider]  predniSONE (DELTASONE) 20 MG tablet Take 1 tablet (20 mg total) by mouth daily with breakfast. 02/24/22   Parrett, Virgel Bouquet, NP  promethazine-dextromethorphan (PROMETHAZINE-DM) 6.25-15 MG/5ML syrup Take 5 mLs by mouth 4 (four) times daily as needed for cough. 01/11/22   Glenford Bayley, NP  sertraline (ZOLOFT) 25 MG tablet Take 25 mg by mouth daily.    [provider]  thiamine (VITAMIN B1) 100 MG tablet Take by mouth. Take 1 tablet (100 mg total) by mouth once daily 06/09/21   [provider]  traMADol (ULTRAM) 50 MG tablet Take 50 mg by mouth every 12 (twelve) hours as needed.    [provider]  traZODone (DESYREL) 50 MG tablet Take 50-100 mg by mouth at bedtime as needed for sleep.    [provider]    Manfred Shirts ,PharmD Clinical Pharmacist  04/26/2022  5:38 PM

## 2022-04-27 ENCOUNTER — Observation Stay
Admit: 2022-04-27 | Discharge: 2022-04-27 | Disposition: A | Discharge status: Home / Self Care | Payer: 59 | Attending: Internal Medicine | Admitting: Internal Medicine

## 2022-04-27 ENCOUNTER — Observation Stay: Payer: 59

## 2022-04-27 DIAGNOSIS — G459 Transient cerebral ischemic attack, unspecified: Secondary | ICD-10-CM | POA: Diagnosis not present

## 2022-04-27 DIAGNOSIS — F03918 Unspecified dementia, unspecified severity, with other behavioral disturbance: Secondary | ICD-10-CM

## 2022-04-27 DIAGNOSIS — N1831 Chronic kidney disease, stage 3a: Secondary | ICD-10-CM

## 2022-04-27 DIAGNOSIS — I10 Essential (primary) hypertension: Secondary | ICD-10-CM

## 2022-04-27 DIAGNOSIS — R7301 Impaired fasting glucose: Secondary | ICD-10-CM | POA: Insufficient documentation

## 2022-04-27 DIAGNOSIS — R4701 Aphasia: Secondary | ICD-10-CM | POA: Diagnosis not present

## 2022-04-27 DIAGNOSIS — Z8673 Personal history of transient ischemic attack (TIA), and cerebral infarction without residual deficits: Secondary | ICD-10-CM

## 2022-04-27 LAB — ECHOCARDIOGRAM COMPLETE
AR max vel: 2.24 cm2
AV Area VTI: 2.42 cm2
AV Area mean vel: 2.34 cm2
AV Mean grad: 3 mmHg
AV Peak grad: 4.9 mmHg
Ao pk vel: 1.11 m/s
Area-P 1/2: 2.24 cm2
Height: 64 in
MV VTI: 2.6 cm2
S' Lateral: 3 cm
Weight: 2208 oz

## 2022-04-27 LAB — LIPID PANEL
Cholesterol: 145 mg/dL (ref 0–200)
HDL: 35 mg/dL — ABNORMAL LOW (ref >40)
LDL Cholesterol: 92 mg/dL (ref 0–99)
Total CHOL/HDL Ratio: 4.1 RATIO
Triglycerides: 88 mg/dL (ref <150)
VLDL: 18 mg/dL (ref 0–40)

## 2022-04-27 MED ORDER — GLUCERNA SHAKE PO LIQD: 237.0000 mL | Freq: Three times a day (TID) | ORAL | 0 refills | Status: AC

## 2022-04-27 MED ORDER — GLUCERNA SHAKE PO LIQD: 237.0000 mL | Freq: Three times a day (TID) | ORAL | Status: DC

## 2022-04-27 MED ORDER — CLOPIDOGREL BISULFATE 75 MG PO TABS: 75.0000 mg | ORAL_TABLET | Freq: Every day | ORAL | 1 refills | Status: AC

## 2022-04-27 MED ORDER — IOHEXOL 350 MG/ML SOLN
75.0000 mL | Freq: Once | INTRAVENOUS | Status: AC | PRN
  Administered 2022-04-27: 75 mL via INTRAVENOUS

## 2022-04-27 MED ORDER — ASPIRIN 81 MG PO TBEC: 81.0000 mg | DELAYED_RELEASE_TABLET | Freq: Every day | ORAL | 0 refills | Status: AC

## 2022-04-27 NOTE — Evaluation (Signed)
Physical Therapy Evaluation Patient Details Name: Tracy Barton MRN: [id number] DOB: 05-05-1945 Today's Date: 04/27/2022  History of Present Illness  Tracy Barton is a 77 y/o F with PMH or CVA, CKD3, ETOH, syncope, cough, trigeminal neuralgia, and dementia. Admitted with AMS; per medical record is typically oriented to family, time, and location, but was confused upon admission; also with aphasia.  Clinical Impression  Pt is a pleasant 77 year old female who was admitted for aphasia. Pt performs bed mobility/transfers with independence and ambulation with supervision and no AD. Pt demonstrates deficits with cognition/safety awareness. Would benefit from skilled PT to address above deficits and promote optimal return to PLOF. Pt is very confused and poor historian, high falls risk. Pt demonstrates poor coordination with B LE and chronic vision deficits. Pt will continue to receive skilled PT services while admitted and will defer to TOC/care team for updates regarding disposition planning.      Recommendations for follow up therapy are one component of a multi-disciplinary discharge planning process, led by the attending physician.  Recommendations may be updated based on patient status, additional functional criteria and insurance authorization.  Follow Up Recommendations       Assistance Recommended at Discharge Frequent or constant Supervision/Assistance  Patient can return home with the following  Direct supervision/assist for medications management;Direct supervision/assist for financial management;Assist for transportation    Equipment Recommendations None recommended by PT  Recommendations for Other Services       Functional Status Assessment Patient has had a recent decline in their functional status and demonstrates the ability to make significant improvements in function in a reasonable and predictable amount of time.     Precautions / Restrictions  Precautions Precautions: Fall Restrictions Weight Bearing Restrictions: No      Mobility  Bed Mobility Overal bed mobility: Independent             General bed mobility comments: impulsive and needs cues for safety    Transfers Overall transfer level: Independent Equipment used: None               General transfer comment: impulsive and stands prior to therapist ready. No AD used. Able to don shoes safely    Ambulation/Gait Ambulation/Gait assistance: Supervision Gait Distance (Feet): 200 Feet Assistive device: None Gait Pattern/deviations: WFL(Within Functional Limits)       General Gait Details: good gait and station however poor safety awareness and gets easily distracted. No AD used  Information systems manager Rankin (Stroke Patients Only)       Balance Overall balance assessment: Modified Independent                                           Pertinent Vitals/Pain Pain Assessment Pain Assessment: No/denies pain    Home Living Family/patient expects to be discharged to:: Private residence Living Arrangements: Children Available Help at Discharge: Family Type of Home: House Home Access: Stairs to enter Entrance Stairs-Rails: None Entrance Stairs-Number of Steps: 2   Home Layout: One level Home Equipment: None Additional Comments: Pt is poor historian and tells multiple versions of living environment to different providers.    Prior Function Prior Level of Function : Needs assist             Mobility Comments: reports she lives indep  with family and uses no AD. Reports she has no falls history ADLs Comments: Pt states she is (I) with ADLs, but is unreliable historian. Contradicts herself about whether she drives or not at baseline.     Hand Dominance        Extremity/Trunk Assessment   Upper Extremity Assessment Upper Extremity Assessment: Overall WFL for tasks assessed     Lower Extremity Assessment Lower Extremity Assessment: Overall WFL for tasks assessed (poor coordination with RAMPs)       Communication   Communication: No difficulties  Cognition Arousal/Alertness: Awake/alert Behavior During Therapy: WFL for tasks assessed/performed Overall Cognitive Status: No family/caregiver present to determine baseline cognitive functioning                                 General Comments: oriented to self only        General Comments General comments (skin integrity, edema, etc.): Pt on room air. In chair when OT arrived; chair alarm on at start and end of session. Pt wearing shoes when OT arrived.    Exercises     Assessment/Plan    PT Assessment Patient needs continued PT services  PT Problem List Decreased cognition;Decreased safety awareness       PT Treatment Interventions Gait training;Cognitive remediation;DME instruction    PT Goals (Current goals can be found in the Care Plan section)  Acute Rehab PT Goals Patient Stated Goal: to go home PT Goal Formulation: With patient Time For Goal Achievement: 05/11/22 Potential to Achieve Goals: Good    Frequency Min 2X/week     Co-evaluation               AM-PAC PT "6 Clicks" Mobility  Outcome Measure Help needed turning from your back to your side while in a flat bed without using bedrails?: None Help needed moving from lying on your back to sitting on the side of a flat bed without using bedrails?: None Help needed moving to and from a bed to a chair (including a wheelchair)?: None Help needed standing up from a chair using your arms (e.g., wheelchair or bedside chair)?: None Help needed to walk in hospital room?: None Help needed climbing 3-5 steps with a railing? : None 6 Click Score: 24    End of Session Equipment Utilized During Treatment: Gait belt Activity Tolerance: Patient tolerated treatment well Patient left: in chair;with chair alarm set Nurse  Communication: Mobility status PT Visit Diagnosis: Unsteadiness on feet (R26.81);Difficulty in walking, not elsewhere classified (R26.2)    Time: 1022-1040 PT Time Calculation (min) (ACUTE ONLY): 18 min   Charges:   PT Evaluation $PT Eval Low Complexity: 1 Low PT Treatments $Gait Training: 8-22 mins        Elizabeth Palau, PT, DPT, GCS [phone]   Ronnie Doo 04/27/2022, 12:01 PM

## 2022-04-27 NOTE — Plan of Care (Addendum)
Neurology plan of care  Stroke workup completed. Per hospitalist patient is now back to baseline.  Interval data  Patient unable to get MRI brain, Repeat head CT at 24 hrs showed no acute process on personal review  TTE   1. Left ventricular ejection fraction, by estimation, is 60 to 65%. The  left ventricle has normal function. The left ventricle has no regional  wall motion abnormalities. There is mild left ventricular hypertrophy.  Left ventricular diastolic parameters  are consistent with Grade I diastolic dysfunction (impaired relaxation).   2. Right ventricular systolic function is normal. The right ventricular  size is normal. There is normal pulmonary artery systolic pressure. The  estimated right ventricular systolic pressure is 31.7 mmHg.   3. The mitral valve is normal in structure. Mild mitral valve  regurgitation. No evidence of mitral stenosis.   4. Tricuspid valve regurgitation is mild to moderate.   5. The aortic valve has an indeterminant number of cusps. Aortic valve  regurgitation is not visualized. No aortic stenosis is present.   6. The inferior vena cava is normal in size with greater than 50%  respiratory variability, suggesting right atrial pressure of 3 mmHg.   Stroke Labs     Component Value Date/Time   CHOL 145 04/27/2022 0557   TRIG 88 04/27/2022 0557   HDL 35 (L) 04/27/2022 0557   CHOLHDL 4.1 04/27/2022 0557   VLDL 18 04/27/2022 0557   LDLCALC 92 04/27/2022 0557    Lab Results  Component Value Date/Time   HGBA1C 5.7 (H) 05/09/2020 05:28 AM   Etiology favored to be TIA.  Recommendations: - ASA 81mg enteric coated x21 days (hx perforated GI ulcer) - Plavix 75mg lifelong - Atorvastatin 40mg daily - Patient may f/u with established outpatient neurology provider Sarah Mason  Neurology will be available prn for questions.  Keeli Roberg, MD [facility] [phone]  If 7pm- 7am, please page neurology on call as listed in  [facility].  

## 2022-04-27 NOTE — Assessment & Plan Note (Addendum)
Recommend checking blood pressure in follow-up appointment.  During the hospital course systolic blood pressure in the 140s to 150s.  We allow blood pressure to be high here in the hospital.

## 2022-04-27 NOTE — Assessment & Plan Note (Signed)
Creatinine 1.04 with a GFR of 55

## 2022-04-27 NOTE — Evaluation (Signed)
Occupational Therapy Evaluation Patient Details Name: Tracy Barton MRN: [id number] DOB: [birth year] Today's Date: 04/27/2022   History of Present Illness Asherah Lavoy is a 77 y/o F with PMH or CVA, CKD3, ETOH, syncope, cough, trigeminal neuralgia, and dementia. Admitted with AMS; per medical record is typically oriented to family, time, and location, but was confused upon admission; also with aphasia.   Clinical Impression   Patient received for OT evaluation. See flowsheet below for details of function. Generally, patient requiring supervision without AD for mobility and ADLs; pt requires cues and supervision 2/2 cognitive deficits.  Patient will benefit from continued OT while in acute care.       Recommendations for follow up therapy are one component of a multi-disciplinary discharge planning process, led by the attending physician.  Recommendations may be updated based on patient status, additional functional criteria and insurance authorization.   Assistance Recommended at Discharge Intermittent Supervision/Assistance  Patient can return home with the following Assist for transportation;Direct supervision/assist for financial management;Direct supervision/assist for medications management;Assistance with cooking/housework    Functional Status Assessment  Patient has had a recent decline in their functional status and demonstrates the ability to make significant improvements in function in a reasonable and predictable amount of time.  Equipment Recommendations  None recommended by OT    Recommendations for Other Services       Precautions / Restrictions Precautions Precautions: Fall Restrictions Weight Bearing Restrictions: No      Mobility Bed Mobility                    Transfers Overall transfer level: Independent                        Balance                                           ADL either performed or assessed  with clinical judgement   ADL Overall ADL's : Needs assistance/impaired                                       General ADL Comments: Pt is able to follow verbal cue to go to sink and wash her hands today; appearing slightly irritated at OT's questions and declines further ADLs (toothbrush and toothpaste available at sink, but pt states she has her own at home). Mobility is supervision without AD, no loss of balance. Pt able to identify soap on the wall and use it appropriately; able to rinse hands in sink and use handles of sink to operate water appropriately. Anticipate pt to be supervision level for ADLs at home given cognitive deficits today. Pt is likely to forget to do daily tasks such as grooming or eating without cues; but once set up anticipate that pt will be able to perform these tasks without hands-on assist.     Vision         Perception     Praxis      Pertinent Vitals/Pain Pain Assessment Pain Assessment: No/denies pain     Hand Dominance     Extremity/Trunk Assessment Upper Extremity Assessment Upper Extremity Assessment: Overall WFL for tasks assessed   Lower Extremity Assessment Lower Extremity Assessment: Overall WFL for tasks assessed  Communication Communication Communication: No difficulties   Cognition Arousal/Alertness: Awake/alert Behavior During Therapy: WFL for tasks assessed/performed (slightly irritated when OT asked increasing questions) Overall Cognitive Status: No family/caregiver present to determine baseline cognitive functioning                                 General Comments: Pt is oriented to self only. Unable to state the day of the week, the month (says it is November), or the year. At one point stated that she thinks her mother is still living (unclear if this is true, but very unlikely). Contradicted herself multiple times during the interview (about who she lives with, whether she drives or not, etc).  Pt appearing fidgety, only intermittently making eye contact with OT.     General Comments  Pt on room air. In chair when OT arrived; chair alarm on at start and end of session. Pt wearing shoes when OT arrived.    Exercises     Shoulder Instructions      Home Living Family/patient expects to be discharged to:: Private residence   Available Help at Discharge: Family Type of Home: House                           Additional Comments: Pt is not a reliable historian; contradicted herself several times during a few minutes of interview; MD to call family to obtain prior level information. Medical record sounds as if pt lives with family at baseline, but unclear who.  Lives With: Family    Prior Functioning/Environment Prior Level of Function : Needs assist             Mobility Comments: Unclear baseline mobility; pt denies AD and falls, but is unreliable historian ADLs Comments: Pt states she is (I) with ADLs, but is unreliable historian. Contradicts herself about whether she drives or not at baseline.        OT Problem List: Decreased cognition;Decreased safety awareness      OT Treatment/Interventions: Self-care/ADL training;Therapeutic activities    OT Goals(Current goals can be found in the care plan section) Acute Rehab OT Goals Patient Stated Goal: Get out of here OT Goal Formulation: Patient unable to participate in goal setting Time For Goal Achievement: 05/11/22 Potential to Achieve Goals: Good ADL Goals Pt Will Transfer to Toilet: with modified independence;regular height toilet  OT Frequency: Min 1X/week    Co-evaluation              AM-PAC OT "6 Clicks" Daily Activity     Outcome Measure Help from another person eating meals?: None Help from another person taking care of personal grooming?: A Little Help from another person toileting, which includes using toliet, bedpan, or urinal?: A Little Help from another person bathing (including  washing, rinsing, drying)?: A Little Help from another person to put on and taking off regular upper body clothing?: None Help from another person to put on and taking off regular lower body clothing?: None 6 Click Score: 21   End of Session Nurse Communication: Mobility status  Activity Tolerance: Patient tolerated treatment well Patient left: with call bell/phone within reach;with chair alarm set;in chair  OT Visit Diagnosis: Other symptoms and signs involving cognitive function                Time: 8119-1478 OT Time Calculation (min): 17 min Charges:  OT General Charges $OT  Visit: 1 Visit OT Evaluation $OT Eval Moderate Complexity: 1 Mod Eryc Bodey Junie Panning, MS, OTR/L  Alvester Morin 04/27/2022, 11:35 AM

## 2022-04-27 NOTE — Assessment & Plan Note (Signed)
Suspected from TIA.  Patient's speech is better.

## 2022-04-27 NOTE — TOC Initial Note (Addendum)
Transition of Care [facility]) - Initial/Assessment Note    Patient Details  Name: Tracy Barton MRN: [id number] Date of Birth: [birth year]  [facility] CM/SW Contact:    Allena Katz, LCSW Phone Number: 04/27/2022, 1:09 PM  Clinical Narrative:     CSW spoke with patients daughter Orland Mustard. She states that and her sisters take care of patient at home. CSW spoke with her about HH. Daughter is agreeable and states that she is find with using who she was using in the past who was [facility]. Daughter reports that she and her family have around the clock assistance from multiple family members who stay with her.  Daughter states pt is still active with Dr. Letta Pate.              1:20pm Barbara Cower with [facility] accepted for PT/OT/speech      Patient Goals and CMS Choice            Expected Discharge Plan and Services                                              Prior Living Arrangements/Services                       Activities of Daily Living Home Assistive Devices/Equipment: None ADL Screening (condition at time of admission) Patient's cognitive ability adequate to safely complete daily activities?: Yes Is the patient deaf or have difficulty hearing?: Yes Does the patient have difficulty seeing, even when wearing glasses/contacts?: No Does the patient have difficulty concentrating, remembering, or making decisions?: No Patient able to express need for assistance with ADLs?: Yes Does the patient have difficulty dressing or bathing?: No Independently performs ADLs?: Yes (appropriate for developmental age) Does the patient have difficulty walking or climbing stairs?: No Weakness of Legs: Both Weakness of Arms/Hands: None  Permission Sought/Granted                  Emotional Assessment              Admission diagnosis:  Aphasia [R47.01] Patient Active Problem List   Diagnosis Date Noted   CKD stage 3a, GFR 45-59 ml/min 04/27/2022    Aphasia 04/26/2022   Chronic cough 02/24/2022   Asthma 01/16/2022   Sick sinus syndrome 05/25/2021   History of cardioembolic cerebrovascular accident (CVA) 03/25/2021   Bradycardia    Syncope and collapse    CVA (cerebral vascular accident) 05/08/2020   Tobacco use 05/08/2020   Syncope/ presyncope - chronic, worsening  02/01/2020   COVID-19 02/01/2020   Sinus bradycardia 02/01/2020   H pylori ulcer 01/23/2020   Acute metabolic encephalopathy 01/23/2020   Chronic alcohol abuse 01/23/2020   Hypokalemia 01/23/2020   Hypophosphatemia 01/23/2020   Acute blood loss anemia 01/23/2020   Essential hypertension 01/23/2020   Protein-calorie malnutrition, severe 01/15/2020   Perforated prepyloric gastric ulcer s/p omental Cheree Ditto patch 01/10/2020 01/10/2020   History of medication noncompliance 01/10/2020   ARF (acute renal failure) 01/10/2020   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   Hep C w/o coma, chronic (HCC) 09/12/2014   Head injury 09/11/2014   Back injury 04/15/2014   Gastritis and gastroduodenitis 03/24/2013   Loss of weight 03/24/2013   Unspecified vitamin D deficiency 03/24/2013   Trigeminal neuralgia 06/01/2011   PCP:  Emogene Morgan, MD Pharmacy:   [facility] -  [place], [place] - [address] [place] [address] Phone: [phone] Fax: [phone]     Social Determinants of Health (SDOH) Social History: SDOH Screenings   Food Insecurity: No Food Insecurity (04/27/2022)  Housing: Low Risk  (04/27/2022)  Transportation Needs: No Transportation Needs (04/27/2022)  Utilities: Not At Risk (04/27/2022)  Tobacco Use: Medium Risk (04/26/2022)   SDOH Interventions:     Readmission Risk Interventions     No data to display

## 2022-04-27 NOTE — Assessment & Plan Note (Signed)
Old stroke left occipital area

## 2022-04-27 NOTE — Assessment & Plan Note (Signed)
Hemoglobin A1c still pending at this time.

## 2022-04-27 NOTE — TOC Transition Note (Signed)
Transition of Care [facility]) - CM/SW Discharge Note   Patient Details  Name: Tracy Barton MRN: [id number] Date of Birth: January 25, 1945  Transition of Care [facility]) CM/SW Contact:  Allena Katz, LCSW Phone Number: 04/27/2022, 3:35 PM   Clinical Narrative:   Pt discharging home with [facility] PT/OT/Speech. Barbara Cower with [facility] notified. CSW signing off.           Patient Goals and CMS Choice      Discharge Placement                         Discharge Plan and Services Additional resources added to the After Visit Summary for                                       Social Determinants of Health (SDOH) Interventions SDOH Screenings   Food Insecurity: No Food Insecurity (04/27/2022)  Housing: Low Risk  (04/27/2022)  Transportation Needs: No Transportation Needs (04/27/2022)  Utilities: Not At Risk (04/27/2022)  Tobacco Use: Medium Risk (04/26/2022)     Readmission Risk Interventions     No data to display

## 2022-04-27 NOTE — Assessment & Plan Note (Addendum)
Patient came in with aphasia and altered mental status.  Code stroke CT scan of the head was negative for acute stroke.  Did show old left occipital stroke.  CT angio of the head and neck did not show any large vessel occlusion but did show severe stenosis in bilateral cavernous ICAs.  No significant stenosis in the neck.  Echocardiogram showed a normal EF.  LDL was 92.  Neurology recommended aspirin for 21 days and Plavix after that.  Continue Lipitor.  Repeat CT scan negative.

## 2022-04-27 NOTE — Progress Notes (Signed)
Received Md order to discharge patient to home, reviewed discharge instructions, follow up appointments home meds and prescriptions with daughter and she verbalized understanding

## 2022-04-27 NOTE — Assessment & Plan Note (Signed)
Patient will likely do better in her home environment.  Will set up PT, OT aide and speech therapy upon going home.

## 2022-04-27 NOTE — Evaluation (Signed)
Speech Language Pathology Evaluation Patient Details Name: Tracy Barton MRN: [id number] DOB: 1945-05-11 Today's Date: 04/27/2022 Time: 4098-1191 SLP Time Calculation (min) (ACUTE ONLY): 15 min  Problem List:  Patient Active Problem List   Diagnosis Date Noted   CKD stage 3a, GFR 45-59 ml/min 04/27/2022   Aphasia 04/26/2022   Chronic cough 02/24/2022   Asthma 01/16/2022   Sick sinus syndrome 05/25/2021   History of cardioembolic cerebrovascular accident (CVA) 03/25/2021   Bradycardia    Syncope and collapse    CVA (cerebral vascular accident) 05/08/2020   Tobacco use 05/08/2020   Syncope/ presyncope - chronic, worsening  02/01/2020   COVID-19 02/01/2020   Sinus bradycardia 02/01/2020   H pylori ulcer 01/23/2020   Acute metabolic encephalopathy 01/23/2020   Chronic alcohol abuse 01/23/2020   Hypokalemia 01/23/2020   Hypophosphatemia 01/23/2020   Acute blood loss anemia 01/23/2020   Essential hypertension 01/23/2020   Protein-calorie malnutrition, severe 01/15/2020   Perforated prepyloric gastric ulcer s/p omental Cheree Ditto patch 01/10/2020 01/10/2020   History of medication noncompliance 01/10/2020   ARF (acute renal failure) 01/10/2020   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   Hep C w/o coma, chronic (HCC) 09/12/2014   Head injury 09/11/2014   Back injury 04/15/2014   Gastritis and gastroduodenitis 03/24/2013   Loss of weight 03/24/2013   Unspecified vitamin D deficiency 03/24/2013   Trigeminal neuralgia 06/01/2011   Past Medical History:  Past Medical History:  Diagnosis Date   Blood transfusion without reported diagnosis    Depression    Neuromuscular disorder    Past Surgical History:  Past Surgical History:  Procedure Laterality Date   BRAIN SURGERY     LAPAROSCOPY N/A 01/10/2020   Procedure: LAPAROSCOPY DIAGNOSTIC laparoscopic washout omental patch splenectomy;  Surgeon: Karie Soda, MD;  Location: [facility]S;  Service: General;  Laterality: N/A;   LAPAROTOMY  N/A 01/10/2020   Procedure: EXPLORATORY LAPAROTOMY;  Surgeon: Karie Soda, MD;  Location: [facility] ORS;  Service: General;  Laterality: N/A;   PACEMAKER IMPLANT N/A 05/25/2021   Procedure: PACEMAKER IMPLANT;  Surgeon: Marcina Millard, MD;  Location: [facility] INVASIVE CV LAB;  Service: Cardiovascular;  Laterality: N/A;   TUBAL LIGATION     HPI:  Per H&P "Tracy Barton is an 77 y.o. female brought to the emergency room via EMS for altered mental status that started around 10 AM which was her last known normal.    Patient has some mild cognitive impairment at baseline and dementia but this was different.  Per report patient was unable to identify family and was disoriented and did not know where she was or the time or the place.    Patient was noted to have aphasia with word finding difficulty..." Head CT, 04/26/22, "1. No acute intracranial pathology.  2. Chronic left paramedian occipital infarct."   Assessment / Plan / Recommendation Clinical Impression  Pt seen for cognitive-communication evaluation. Pt alert, pleasant, and cooperative. Pt with noted hx of mild cognitive impairment and dementia per chart review. No family present to provide additional details of baseline level of functioning. Pt deemed unreliable historian. Assessment completed via informal means and portions of the Western Aphasia Battery Revised - Bedside Record Form. Pt's speech is fluent and without s/sx dysarthria. Pt with occasional semantic paraphasis and anomia during informal conversational exchanges and structured tasks. Pt presents with cognitive-communication deficits affecting orientation (temporal, situational), memory (short term, functional), problem solving/executive functioning (verbal/non-verbal), safety awareness, and complex auditory and verbal expression. Difficult to discern pt's baseline level of  functioning at this time. Recommend post-acute SLP services for further cognitive-communication evaluation/tx in  home/functional setting.    SLP Assessment  SLP Recommendation/Assessment: All further Speech Lanaguage Pathology  needs can be addressed in the next venue of care SLP Visit Diagnosis: Cognitive communication deficit (R41.841)    Recommendations for follow up therapy are one component of a multi-disciplinary discharge planning process, led by the attending physician.  Recommendations may be updated based on patient status, additional functional criteria and insurance authorization.    Follow Up Recommendations  Home health SLP    Assistance Recommended at Discharge  Frequent or constant Supervision/Assistance  Functional Status Assessment Patient has had a recent decline in their functional status and demonstrates the ability to make significant improvements in function in a reasonable and predictable amount of time.  Frequency and Duration           SLP Evaluation Cognition  Overall Cognitive Status: Difficult to assess Arousal/Alertness: Awake/alert Orientation Level: Oriented to person;Disoriented to situation;Disoriented to time Memory: Impaired Memory Impairment: Storage deficit;Retrieval deficit;Decreased short term memory Awareness: Impaired Problem Solving: Impaired Problem Solving Impairment: Verbal basic;Functional basic Executive Function: Reasoning;Sequencing;Organizing;Self Monitoring;Self Correcting (per clockdrawing task) Safety/Judgment: Impaired       Comprehension  Auditory Comprehension Overall Auditory Comprehension: Impaired Yes/No Questions: Impaired Complex Questions: 75-100% accurate Commands: Within Functional Limits Conversation: Simple (WFL) Visual Recognition/Discrimination Discrimination: Not tested Reading Comprehension Reading Status: Impaired (paraphasic errors noted while reading aloud)    Expression Expression Primary Mode of Expression: Verbal Verbal Expression Overall Verbal Expression: Impaired Initiation: No impairment Automatic  Speech: Name;Social Response (WFL) Level of Generative/Spontaneous Verbalization: Sentence Repetition: No impairment Naming: Impairment Confrontation: Impaired (9/10 common objects) Verbal Errors: Semantic paraphasias Pragmatics: No impairment   Oral / Motor  Oral Motor/Sensory Function Overall Oral Motor/Sensory Function: Within functional limits Motor Speech Overall Motor Speech: Appears within functional limits for tasks assessed Respiration: Within functional limits Phonation: Normal Resonance: Within functional limits Articulation: Within functional limitis Intelligibility: Intelligible Motor Planning: Witnin functional limits Motor Speech Errors: Not applicable           Clyde Canterbury, M.S., CCC-SLP Speech-Language Pathologist [facility] [phone] (ASCOM)  Woodroe Chen 04/27/2022, 8:58 AM

## 2022-04-27 NOTE — Discharge Summary (Addendum)
Physician Discharge Summary   Patient: Tracy Barton MRN: [id number] DOB: 28-Sep-1945  Admit date:     04/26/2022  Discharge date: 04/27/22  Discharge Physician: Alford Highland   PCP: Emogene Morgan, MD   Recommendations at discharge:   Follow-up PCP 5 days Follow-up outpatient neurology.  Discharge Diagnoses: Principal Problem:   TIA (transient ischemic attack) Active Problems:   Aphasia   Dementia with behavioral disturbance (HCC)   CKD stage 3a, GFR 45-59 ml/min   Essential hypertension   History of cardioembolic cerebrovascular accident (CVA)   Tobacco use   Impaired fasting glucose    Hospital Course: 77 year old female brought into the emergency room with altered mental status and slurred speech.  She has underlying baseline dementia.  Code stroke was called in the emergency room.  Seen in consultation by neurology.  Initial CT scan of the head and CT angio were negative for acute stroke.  Since the patient does have a pacemaker we repeated a CT scan which was also negative.  Patient's speech is improved.    Assessment and Plan: * TIA (transient ischemic attack) Patient came in with aphasia and altered mental status.  Code stroke CT scan of the head was negative for acute stroke.  Did show old left occipital stroke.  CT angio of the head and neck did not show any large vessel occlusion but did show severe stenosis in bilateral cavernous ICAs.  No significant stenosis in the neck.  Echocardiogram showed a normal EF.  LDL was 92.  Neurology recommended aspirin for 21 days and Plavix after that.  Continue Lipitor.  Repeat CT scan negative.  Aphasia Suspected from TIA.  Patient's speech is better.  Dementia with behavioral disturbance [facility]) Patient will likely do better in her home environment.  Will set up PT, OT aide and speech therapy upon going home.  CKD stage 3a, GFR 45-59 ml/min Creatinine 1.04 with a GFR of 55  Essential hypertension Recommend checking  blood pressure in follow-up appointment.  During the hospital course systolic blood pressure in the 140s to 150s.  We allow blood pressure to be high here in the hospital.  History of cardioembolic cerebrovascular accident (CVA) Old stroke left occipital area  Impaired fasting glucose Hemoglobin A1c still pending at this time.         Consultants: Neurology Procedures performed: None Disposition: Home health Diet recommendation:  Cardiac diet DISCHARGE MEDICATION: Allergies as of 04/27/2022       Reactions   Nsaids Other (See Comments)   PERFORATED ULCER        Medication List     STOP taking these medications    benzonatate 200 MG capsule Commonly known as: TESSALON   esomeprazole 40 MG capsule Commonly known as: NexIUM   gabapentin 100 MG capsule Commonly known as: NEURONTIN   predniSONE 20 MG tablet Commonly known as: DELTASONE   promethazine-dextromethorphan 6.25-15 MG/5ML syrup Commonly known as: PROMETHAZINE-DM   traMADol 50 MG tablet Commonly known as: ULTRAM       TAKE these medications    albuterol 108 (90 Base) MCG/ACT inhaler Commonly known as: VENTOLIN HFA Inhale 1-2 puffs into the lungs every 6 (six) hours as needed.   alendronate 70 MG tablet Commonly known as: FOSAMAX Take 70 mg by mouth once a week.   aspirin EC 81 MG tablet Take 1 tablet (81 mg total) by mouth daily for 21 days. Swallow whole.   atorvastatin 40 MG tablet Commonly known as: Lipitor Take 1 tablet (  40 mg total) by mouth at bedtime.   clopidogrel 75 MG tablet Commonly known as: PLAVIX Take 1 tablet (75 mg total) by mouth daily. Start taking on: April 28, 2022   donepezil 10 MG tablet Commonly known as: ARICEPT Take 10 mg by mouth at bedtime.   feeding supplement (GLUCERNA SHAKE) Liqd Take 237 mLs by mouth 3 (three) times daily between meals.   megestrol 20 MG tablet Commonly known as: MEGACE Take 20 mg by mouth daily.   memantine 5 MG tablet Commonly  known as: NAMENDA Take 5 mg by mouth 2 (two) times daily.   pantoprazole 40 MG tablet Commonly known as: PROTONIX Take 40 mg by mouth daily as needed (Acid Reflux).   sertraline 25 MG tablet Commonly known as: ZOLOFT Take 25 mg by mouth daily.   thiamine 100 MG tablet Commonly known as: VITAMIN B1 Take by mouth. Take 1 tablet (100 mg total) by mouth once daily   traZODone 50 MG tablet Commonly known as: DESYREL Take 50-100 mg by mouth at bedtime as needed for sleep.        Follow-up Information     Emogene Morgan, MD. Go on 05/09/2022.   Specialty: Family Medicine Why: @10am  Contact information: [address] [place] [facility] [place] [address] [phone]         [facility] Follow up in 4 week(s).   Contact information: [address]     [address] [place] [address] [phone]               Discharge Exam: Ceasar Mons Weights   04/26/22 1743  Weight: 62.6 kg   Physical Exam HENT:     Head: Normocephalic.     Mouth/Throat:     Pharynx: No oropharyngeal exudate.  Eyes:     General: Lids are normal.     Conjunctiva/sclera: Conjunctivae normal.  Cardiovascular:     Rate and Rhythm: Normal rate and regular rhythm.     Heart sounds: Normal heart sounds, S1 normal and S2 normal.  Pulmonary:     Breath sounds: No decreased breath sounds, wheezing, rhonchi or rales.  Abdominal:     Palpations: Abdomen is soft.     Tenderness: There is no abdominal tenderness.  Musculoskeletal:     Right lower leg: No swelling.     Left lower leg: No swelling.  Skin:    General: Skin is warm.     Findings: No rash.  Neurological:     Mental Status: She is alert.     Comments: Answers questions but is tangential with her answers.  Confused on the date and where she is.      Condition at discharge: fair  The results of significant diagnostics from this hospitalization (including imaging, microbiology, ancillary and laboratory)  are listed below for reference.   Imaging Studies: CT HEAD WO CONTRAST ( )  Result Date: 04/27/2022 CLINICAL DATA:  TIA EXAM: CT HEAD WITHOUT CONTRAST TECHNIQUE: Contiguous axial images were obtained from the base of the skull through the vertex without intravenous contrast. RADIATION DOSE REDUCTION: This exam was performed according to the departmental dose-optimization program which includes automated exposure control, adjustment of the mA and/or kV according to patient size and/or use of iterative reconstruction technique. COMPARISON:  CT Head 04/26/22 FINDINGS: Brain: No CT evidence of an acute cortical infarct. No hemorrhage. No hydrocephalus. No extra-axial fluid collection. Redemonstrated is a chronic left occipital lobe infarct. Vascular: No hyperdense vessel or unexpected calcification. Skull: Normal.  Negative for fracture or focal lesion. Sinuses/Orbits: Large bilateral mastoid effusions, unchanged from prior exam. Paranasal sinuses are clear. No middle ear effusion. Orbits are unremarkable. Other: None. IMPRESSION: 1. No hemorrhage or CT evidence of an acute cortical infarct. 2. Chronic left occipital lobe infarct. 3. Large bilateral mastoid effusions, unchanged from prior exam. Electronically Signed   By: Lorenza Cambridge M.D.   On: 04/27/2022 14:39   ECHOCARDIOGRAM COMPLETE  Result Date: 04/27/2022    ECHOCARDIOGRAM REPORT   Patient Name:   VALICIA RIEF Date of Exam: 04/27/2022 Medical Rec #:  [id number]         Height:       64.0 in Accession #:    [id number]        Weight:       138.0 lb Date of Birth:  07-Oct-1945        BSA:          1.671 m Patient Age:    77 years          BP:           141/79 mmHg Patient Gender: F                 HR:           63 bpm. Exam Location:  [facility] Procedure: 2D Echo, Cardiac Doppler and Color Doppler Indications:     Stroke  History:         Patient has prior history of Echocardiogram examinations, most                  recent 03/26/2021. Stroke,  Arrythmias:Bradycardia,                  Signs/Symptoms:Syncope; Risk Factors:Hypertension and Current                  Smoker. ETOH abuse.  Sonographer:     Mikki Harbor Referring Phys:  [id number] Eliezer Mccoy PATEL Diagnosing Phys: Julien Nordmann MD  Sonographer Comments: Image acquisition challenging due to respiratory motion. IMPRESSIONS  1. Left ventricular ejection fraction, by estimation, is 60 to 65%. The left ventricle has normal function. The left ventricle has no regional wall motion abnormalities. There is mild left ventricular hypertrophy. Left ventricular diastolic parameters are consistent with Grade I diastolic dysfunction (impaired relaxation).  2. Right ventricular systolic function is normal. The right ventricular size is normal. There is normal pulmonary artery systolic pressure. The estimated right ventricular systolic pressure is 31.7 mmHg.  3. The mitral valve is normal in structure. Mild mitral valve regurgitation. No evidence of mitral stenosis.  4. Tricuspid valve regurgitation is mild to moderate.  5. The aortic valve has an indeterminant number of cusps. Aortic valve regurgitation is not visualized. No aortic stenosis is present.  6. The inferior vena cava is normal in size with greater than 50% respiratory variability, suggesting right atrial pressure of 3 mmHg. FINDINGS  Left Ventricle: Left ventricular ejection fraction, by estimation, is 60 to 65%. The left ventricle has normal function. The left ventricle has no regional wall motion abnormalities. The left ventricular internal cavity size was normal in size. There is  mild left ventricular hypertrophy. Left ventricular diastolic parameters are consistent with Grade I diastolic dysfunction (impaired relaxation). Right Ventricle: The right ventricular size is normal. No increase in right ventricular wall thickness. Right ventricular systolic function is normal. There is normal pulmonary artery systolic pressure. The tricuspid regurgitant  velocity is 2.68 m/s, and  with an assumed right atrial pressure of 3 mmHg, the estimated right ventricular systolic pressure is 31.7 mmHg. Left Atrium: Left atrial size was normal in size. Right Atrium: Right atrial size was normal in size. Pericardium: Trivial pericardial effusion is present. Mitral Valve: The mitral valve is normal in structure. Mild mitral valve regurgitation. No evidence of mitral valve stenosis. MV peak gradient, 4.0 mmHg. The mean mitral valve gradient is 1.0 mmHg. Tricuspid Valve: The tricuspid valve is normal in structure. Tricuspid valve regurgitation is mild to moderate. No evidence of tricuspid stenosis. Aortic Valve: The aortic valve has an indeterminant number of cusps. Aortic valve regurgitation is not visualized. No aortic stenosis is present. Aortic valve mean gradient measures 3.0 mmHg. Aortic valve peak gradient measures 4.9 mmHg. Aortic valve area, by VTI measures 2.42 cm. Pulmonic Valve: The pulmonic valve was normal in structure. Pulmonic valve regurgitation is not visualized. No evidence of pulmonic stenosis. Aorta: The aortic root is normal in size and structure. Venous: The inferior vena cava is normal in size with greater than 50% respiratory variability, suggesting right atrial pressure of 3 mmHg. IAS/Shunts: No atrial level shunt detected by color flow Doppler.  LEFT VENTRICLE PLAX 2D LVIDd:         4.30 cm   Diastology LVIDs:         3.00 cm   LV e' medial:    28.62 cm/s LV PW:         1.10 cm   LV E/e' medial:  1.8 LV IVS:        1.20 cm   LV e' lateral:   7.83 cm/s LVOT diam:     2.00 cm   LV E/e' lateral: 6.6 LV SV:         61 LV SV Index:   36 LVOT Area:     3.14 cm  RIGHT VENTRICLE RV Basal diam:  3.10 cm RV Mid diam:    2.60 cm RV S prime:     12.90 cm/s TAPSE (M-mode): 2.4 cm LEFT ATRIUM             Index        RIGHT ATRIUM           Index LA diam:        3.40 cm 2.03 cm/m   RA Area:     15.50 cm LA Vol (A2C):   49.0 ml 29.32 ml/m  RA Volume:   36.60 ml   21.90 ml/m LA Vol (A4C):   33.3 ml 19.93 ml/m LA Biplane Vol: 43.1 ml 25.79 ml/m  AORTIC VALVE                    PULMONIC VALVE AV Area (Vmax):    2.24 cm     PV Vmax:       0.77 m/s AV Area (Vmean):   2.34 cm     PV Peak grad:  2.4 mmHg AV Area (VTI):     2.42 cm AV Vmax:           111.00 cm/s AV Vmean:          73.700 cm/s AV VTI:            0.251 m AV Peak Grad:      4.9 mmHg AV Mean Grad:      3.0 mmHg LVOT Vmax:         79.00 cm/s LVOT Vmean:        54.900 cm/s  LVOT VTI:          0.193 m LVOT/AV VTI ratio: 0.77  AORTA Ao Root diam: 3.40 cm MITRAL VALVE               TRICUSPID VALVE MV Area (PHT): 2.24 cm    TR Peak grad:   28.7 mmHg MV Area VTI:   2.60 cm    TR Vmax:        268.00 cm/s MV Peak grad:  4.0 mmHg MV Mean grad:  1.0 mmHg    SHUNTS MV Vmax:       1.00 m/s    Systemic VTI:  0.19 m MV Vmean:      43.7 cm/s   Systemic Diam: 2.00 cm MV Decel Time: 338 msec MV E velocity: 51.50 cm/s MV A velocity: 90.05 cm/s MV E/A ratio:  0.57 Julien Nordmann MD Electronically signed by Julien Nordmann MD Signature Date/Time: 04/27/2022/10:29:40 AM    Final    CT Angio Chest Pulmonary Embolism (PE) W or WO Contrast  Result Date: 04/27/2022 CLINICAL DATA:  Altered mental status, aphasia EXAM: CT ANGIOGRAPHY CHEST WITH CONTRAST TECHNIQUE: Multidetector CT imaging of the chest was performed using the standard protocol during bolus administration of intravenous contrast. Multiplanar CT image reconstructions and MIPs were obtained to evaluate the vascular anatomy. RADIATION DOSE REDUCTION: This exam was performed according to the departmental dose-optimization program which includes automated exposure control, adjustment of the mA and/or kV according to patient size and/or use of iterative reconstruction technique. CONTRAST:  75mL OMNIPAQUE IOHEXOL 350 MG/ML SOLN COMPARISON:  12/13/2021 FINDINGS: Cardiovascular: No filling defects in the pulmonary arteries to suggest pulmonary emboli. Heart is normal size. Aorta is  normal caliber. Scattered coronary artery and aortic calcifications. Small pericardial effusion. Mediastinum/Nodes: No mediastinal, hilar, or axillary adenopathy. Trachea and esophagus are unremarkable. Thyroid unremarkable. Lungs/Pleura: No confluent opacities or effusions. Upper Abdomen: Imaging into the upper abdomen demonstrates no acute findings. Musculoskeletal: Chest wall soft tissues are unremarkable. No acute bony abnormality. Left chest wall stimulator battery pack noted. Review of the MIP images confirms the above findings. IMPRESSION: No evidence of pulmonary embolus. No acute cardiopulmonary disease. Coronary artery disease. Aortic Atherosclerosis (ICD10-I70.0). Electronically Signed   By: Charlett Nose M.D.   On: 04/27/2022 01:08   CT ANGIO HEAD NECK W WO CM (CODE STROKE)  Result Date: 04/26/2022 CLINICAL DATA:  Aphasia EXAM: CT ANGIOGRAPHY HEAD AND NECK WITH AND WITHOUT CONTRAST TECHNIQUE: Multidetector CT imaging of the head and neck was performed using the standard protocol during bolus administration of intravenous contrast. Multiplanar CT image reconstructions and MIPs were obtained to evaluate the vascular anatomy. Carotid stenosis measurements (when applicable) are obtained utilizing NASCET criteria, using the distal internal carotid diameter as the denominator. RADIATION DOSE REDUCTION: This exam was performed according to the departmental dose-optimization program which includes automated exposure control, adjustment of the mA and/or kV according to patient size and/or use of iterative reconstruction technique. CONTRAST:  75mL OMNIPAQUE IOHEXOL 350 MG/ML SOLN COMPARISON:  No prior CTA available, correlation is made with MRA head 05/08/2020 and CT head 04/26/2022 FINDINGS: CT HEAD FINDINGS For noncontrast findings, please see same day CT head. CTA NECK FINDINGS Aortic arch: Two-vessel arch with a common origin of the brachiocephalic and left common carotid arteries. Imaged portion shows no  evidence of aneurysm or dissection. No significant stenosis of the major arch vessel origins. Right carotid system: No evidence of dissection, occlusion, or hemodynamically significant stenosis (greater than 50%). Atherosclerotic disease at  the bifurcation and in the proximal ICA is not hemodynamically significant. Left carotid system: No evidence of dissection, occlusion, or hemodynamically significant stenosis (greater than 50%). Atherosclerotic disease at the bifurcation and in the proximal ICA is not hemodynamically significant. Vertebral arteries: No evidence of dissection, occlusion, or hemodynamically significant stenosis (greater than 50%). Skeleton: No acute osseous abnormality. Degenerative changes in the cervical spine. Other neck: Negative. Upper chest: Bronchial wall thickening. Emphysema. No focal pulmonary opacity or pleural effusion. Review of the MIP images confirms the above findings CTA HEAD FINDINGS Evaluation is somewhat limited by motion artifact. Anterior circulation: Both internal carotid arteries are patent to the termini, with severe stenosis in the bilateral cavernous segments. A1 segments patent. Normal anterior communicating artery. Anterior cerebral arteries are patent to their distal aspects without significant stenosis. No M1 stenosis or occlusion. MCA are grossly perfused to their distal aspects, although evaluation is limited by motion. In addition, the right MCA branches appear somewhat less opacified compared to the left MCA branches. Posterior circulation: Vertebral arteries patent to the vertebrobasilar junction without significant stenosis. Posterior inferior cerebellar arteries patent proximally. Basilar patent to its distal aspect without significant stenosis. Superior cerebellar arteries patent proximally. Patent right P1. Fetal origin the left PCA from the left posterior communicating artery. PCAs perfused to their distal aspects without significant stenosis. Right  posterior communicating artery is not visualized. Venous sinuses: Not well evaluated due to phase of timing. Anatomic variants: Fetal origin the left PCA. Review of the MIP images confirms the above findings IMPRESSION: 1. Evaluation of the intracranial vasculature is somewhat limited by motion artifact. Within this limitation, no intracranial large vessel occlusion. 2. 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 3. No hemodynamically significant stenosis in the neck. Impression #1 was discussed by telephone on 04/26/2022 at 5:30 pm with provider COLLEEN STACK . Electronically Signed   By: Wiliam Ke M.D.   On: 04/26/2022 17:41   CT HEAD CODE STROKE WO CONTRAST  Result Date: 04/26/2022 CLINICAL DATA:  Code stroke. Neuro deficit, acute, stroke suspected. Dementia. EXAM: CT HEAD WITHOUT CONTRAST TECHNIQUE: Contiguous axial images were obtained from the base of the skull through the vertex without intravenous contrast. RADIATION DOSE REDUCTION: This exam was performed according to the departmental dose-optimization program which includes automated exposure control, adjustment of the mA and/or kV according to patient size and/or use of iterative reconstruction technique. COMPARISON:  03/06/2022 FINDINGS: Brain: No focal abnormality seen affecting the brainstem or cerebellum. Old left occipital stroke as seen previously. Chronic small-vessel ischemic changes of the cerebral hemispheric white matter. No sign of acute infarction, mass lesion, hemorrhage, hydrocephalus or extra-axial collection. Vascular: There is atherosclerotic calcification of the major vessels at the base of the brain. Skull: Negative Sinuses/Orbits: Clear/normal Other: Some fluid in the mastoid air cells and middle ears. ASPECTS [facility] Stroke Program Early CT Score) - Ganglionic level infarction (caudate, lentiform nuclei, internal capsule, insula, M1-M3 cortex): 7 - Supraganglionic infarction (M4-M6 cortex): 3 Total score (0-10 with 10  being normal): 10 IMPRESSION: 1. No acute CT finding. Old left occipital stroke. Chronic small-vessel ischemic changes of the white matter. 2. Aspects is 10. These results were communicated to Dr. Selina Cooley at 5:20 pm on 04/26/2022 by text page via the [facility] messaging system. Electronically Signed   By: Paulina Fusi M.D.   On: 04/26/2022 17:21    Microbiology: Results for orders placed or performed during the hospital encounter of 05/08/20  Resp Panel by RT-PCR (Flu A&B, Covid) Nasopharyngeal Swab  Status: None   Collection Time: 05/08/20  6:49 PM   Specimen: Nasopharyngeal Swab; Nasopharyngeal(NP) swabs in vial transport medium  Result Value Ref Range Status   SARS Coronavirus 2 by RT PCR NEGATIVE NEGATIVE Final    Comment: (NOTE) SARS-CoV-2 target nucleic acids are NOT DETECTED.  The SARS-CoV-2 RNA is generally detectable in upper respiratory specimens during the acute phase of infection. The lowest concentration of SARS-CoV-2 viral copies this assay can detect is 138 copies/mL. A negative result does not preclude SARS-Cov-2 infection and should not be used as the sole basis for treatment or other patient management decisions. A negative result may occur with  improper specimen collection/handling, submission of specimen other than nasopharyngeal swab, presence of viral mutation(s) within the areas targeted by this assay, and inadequate number of viral copies(<138 copies/mL). A negative result must be combined with clinical observations, patient history, and epidemiological information. The expected result is Negative.  Fact Sheet for Patients:  BloggerCourse.com  Fact Sheet for Healthcare Providers:  SeriousBroker.it  This test is no t yet approved or cleared by the [place] FDA and  has been authorized for detection and/or diagnosis of SARS-CoV-2 by FDA under an Emergency Use Authorization (EUA). This EUA will remain  in effect  (meaning this test can be used) for the duration of the COVID-19 declaration under Section 564(b)(1) of the Act, 21 U.S.C.section 360bbb-3(b)(1), unless the authorization is terminated  or revoked sooner.       Influenza A by PCR NEGATIVE NEGATIVE Final   Influenza B by PCR NEGATIVE NEGATIVE Final    Comment: (NOTE) The Xpert Xpress SARS-CoV-2/FLU/RSV plus assay is intended as an aid in the diagnosis of influenza from Nasopharyngeal swab specimens and should not be used as a sole basis for treatment. Nasal washings and aspirates are unacceptable for Xpert Xpress SARS-CoV-2/FLU/RSV testing.  Fact Sheet for Patients: BloggerCourse.com  Fact Sheet for Healthcare Providers: SeriousBroker.it  This test is not yet approved or cleared by the [place] FDA and has been authorized for detection and/or diagnosis of SARS-CoV-2 by FDA under an Emergency Use Authorization (EUA). This EUA will remain in effect (meaning this test can be used) for the duration of the COVID-19 declaration under Section 564(b)(1) of the Act, 21 U.S.C. section 360bbb-3(b)(1), unless the authorization is terminated or revoked.  Performed at [facility], [address] [place], [place] [address]     Labs: CBC: Recent Labs  Lab 04/26/22 1655  WBC 7.3  NEUTROABS 5.0  HGB 13.9  HCT 41.8  MCV 81.8  PLT 297   Basic Metabolic Panel: Recent Labs  Lab 04/26/22 1655  NA 135  K 3.9  CL 107  CO2 20*  GLUCOSE 208*  BUN 12  CREATININE 1.04*  CALCIUM 9.4   Liver Function Tests: Recent Labs  Lab 04/26/22 1655  AST 23  ALT 13  ALKPHOS 91  BILITOT 0.7  PROT 7.5  ALBUMIN 4.1   CBG: Recent Labs  Lab 04/26/22 1656  GLUCAP 197*    Discharge time spent: greater than 30 minutes.  Signed: Alford Highland, MD [facility] 04/27/2022

## 2022-04-28 LAB — HEMOGLOBIN A1C
Hgb A1c MFr Bld: 6.1 % — ABNORMAL HIGH (ref 4.8–5.6)
Mean Plasma Glucose: 128 mg/dL

## 2023-01-29 ENCOUNTER — Other Ambulatory Visit: Payer: Self-pay

## 2023-01-29 ENCOUNTER — Emergency Department: Payer: 59

## 2023-01-29 ENCOUNTER — Emergency Department
Admission: EM | Admit: 2023-01-29 | Discharge: 2023-01-29 | Disposition: A | Discharge status: Home / Self Care | Payer: 59 | Attending: Emergency Medicine | Admitting: Emergency Medicine

## 2023-01-29 DIAGNOSIS — R1013 Epigastric pain: Secondary | ICD-10-CM

## 2023-01-29 DIAGNOSIS — K5792 Diverticulitis of intestine, part unspecified, without perforation or abscess without bleeding: Secondary | ICD-10-CM

## 2023-01-29 DIAGNOSIS — N189 Chronic kidney disease, unspecified: Secondary | ICD-10-CM | POA: Insufficient documentation

## 2023-01-29 DIAGNOSIS — Z8673 Personal history of transient ischemic attack (TIA), and cerebral infarction without residual deficits: Secondary | ICD-10-CM | POA: Diagnosis not present

## 2023-01-29 DIAGNOSIS — F0390 Unspecified dementia without behavioral disturbance: Secondary | ICD-10-CM | POA: Insufficient documentation

## 2023-01-29 DIAGNOSIS — N3000 Acute cystitis without hematuria: Secondary | ICD-10-CM | POA: Diagnosis not present

## 2023-01-29 DIAGNOSIS — K2900 Acute gastritis without bleeding: Secondary | ICD-10-CM

## 2023-01-29 LAB — CBC
HCT: 40 % (ref 36.0–46.0)
Hemoglobin: 13.5 g/dL (ref 12.0–15.0)
MCH: 27.1 pg (ref 26.0–34.0)
MCHC: 33.8 g/dL (ref 30.0–36.0)
MCV: 80.2 fL (ref 80.0–100.0)
Platelets: 304 10*3/uL (ref 150–400)
RBC: 4.99 MIL/uL (ref 3.87–5.11)
RDW: 14.1 % (ref 11.5–15.5)
WBC: 8.7 10*3/uL (ref 4.0–10.5)
nRBC: 0 % (ref 0.0–0.2)

## 2023-01-29 LAB — COMPREHENSIVE METABOLIC PANEL
ALT: 13 U/L (ref 0–44)
AST: 19 U/L (ref 15–41)
Albumin: 4 g/dL (ref 3.5–5.0)
Alkaline Phosphatase: 71 U/L (ref 38–126)
Anion gap: 10 (ref 5–15)
BUN: 10 mg/dL (ref 8–23)
CO2: 21 mmol/L — ABNORMAL LOW (ref 22–32)
Calcium: 9.3 mg/dL (ref 8.9–10.3)
Chloride: 109 mmol/L (ref 98–111)
Creatinine, Ser: 0.94 mg/dL (ref 0.44–1.00)
GFR, Estimated: >60 mL/min (ref >60)
Glucose, Bld: 111 mg/dL — ABNORMAL HIGH (ref 70–99)
Potassium: 3.7 mmol/L (ref 3.5–5.1)
Sodium: 140 mmol/L (ref 135–145)
Total Bilirubin: 0.9 mg/dL (ref 0.0–1.2)
Total Protein: 7.1 g/dL (ref 6.5–8.1)

## 2023-01-29 LAB — URINALYSIS, ROUTINE W REFLEX MICROSCOPIC
Bilirubin Urine: NEGATIVE
Glucose, UA: NEGATIVE mg/dL
Hgb urine dipstick: NEGATIVE
Ketones, ur: NEGATIVE mg/dL
Nitrite: POSITIVE — AB
Protein, ur: NEGATIVE mg/dL
Specific Gravity, Urine: 1.019 (ref 1.005–1.030)
pH: 5 (ref 5.0–8.0)

## 2023-01-29 LAB — TROPONIN I (HIGH SENSITIVITY): Troponin I (High Sensitivity): 6 ng/L (ref <18)

## 2023-01-29 LAB — LIPASE, BLOOD: Lipase: 47 U/L (ref 11–51)

## 2023-01-29 MED ORDER — MORPHINE SULFATE (PF) 4 MG/ML IV SOLN
4.0000 mg | Freq: Once | INTRAVENOUS | Status: AC
  Administered 2023-01-29: 4 mg via INTRAVENOUS
  Filled 2023-01-29: qty 1

## 2023-01-29 MED ORDER — IOHEXOL 300 MG/ML  SOLN
100.0000 mL | Freq: Once | INTRAMUSCULAR | Status: AC | PRN
  Administered 2023-01-29: 100 mL via INTRAVENOUS

## 2023-01-29 MED ORDER — SUCRALFATE 1 G PO TABS: 1.0000 g | ORAL_TABLET | Freq: Four times a day (QID) | ORAL | 0 refills | Status: AC

## 2023-01-29 MED ORDER — CEPHALEXIN 500 MG PO CAPS: 500.0000 mg | ORAL_CAPSULE | Freq: Four times a day (QID) | ORAL | 0 refills | Status: AC

## 2023-01-29 MED ORDER — LACTATED RINGERS IV BOLUS
1000.0000 mL | Freq: Once | INTRAVENOUS | Status: AC
  Administered 2023-01-29: 1000 mL via INTRAVENOUS

## 2023-01-29 MED ORDER — PANTOPRAZOLE SODIUM 40 MG PO TBEC: 40.0000 mg | DELAYED_RELEASE_TABLET | Freq: Every day | ORAL | 1 refills | Status: AC

## 2023-01-29 MED ORDER — ONDANSETRON HCL 4 MG/2ML IJ SOLN
4.0000 mg | Freq: Once | INTRAMUSCULAR | Status: AC
  Administered 2023-01-29: 4 mg via INTRAVENOUS
  Filled 2023-01-29: qty 2

## 2023-01-29 NOTE — ED Triage Notes (Signed)
Patient reports pain in her abdomen in the middle. Denies nausea, vomiting, and diarrhea. Also reports her head and neck are hurting.   Difficulty getting formation from the patient. She responds "I don't know" to most of the questions.

## 2023-01-29 NOTE — Discharge Instructions (Addendum)
Please call the number provided for GI medicine to arrange a follow-up appointment.  Return to the emergency department for any worsening pain or symptoms.  Please take your medications as prescribed as well as the entire course of antibiotics.  Please follow-up with your PCP in the next several days for recheck/reevaluation.

## 2023-01-29 NOTE — ED Notes (Signed)
First Nurse Note: Pt to ED via [facility] from home. EMS was called out due to difficulty waking her up. Per EMS when they arrived pt was alert and looking around. Pt has hx/o dementia but family states that she is not at her baseline. VSS with EMS. Pt is in NAD.   CBG 119

## 2023-01-29 NOTE — ED Provider Notes (Signed)
[facility] Provider Note    Event Date/Time   First MD Initiated Contact with Patient 01/29/23 1315     (approximate)   History   Chief Complaint Abdominal Pain  HPI  Tracy Barton is a 78 y.o. female with past medical history of perforated gastric ulcer, CKD, hep C, stroke, and dementia who presents to the ED complain of abdominal pain.  History is limited due to patient's baseline dementia and majority of history obtained from son at bedside.  Son reports that for the past few nights patient has complained of some pain in her upper abdomen.  He then found her this morning curled up in a ball and clutching her abdomen.  Son reports that pain does not seem to be as bad now, but patient reports ongoing epigastric pain as well as pain across both sides of her chest.  She denies any fevers or cough and has not had any difficulty breathing.  She denies any dysuria or flank pain.       Physical Exam   Triage Vital Signs: ED Triage Vitals  Encounter Vitals Group     BP 01/29/23 0938 113/76     Systolic BP Percentile --      Diastolic BP Percentile --      Pulse Rate 01/29/23 0938 65     Resp 01/29/23 0938 15     Temp 01/29/23 0938 97.8 F (36.6 C)     Temp Source 01/29/23 0938 Oral     SpO2 01/29/23 0938 95 %     Weight 01/29/23 1321 138 lb 0.1 oz (62.6 kg)     Height 01/29/23 1321 5\' 4"  (1.626 m)     Head Circumference --      Peak Flow --      Pain Score --      Pain Loc --      Pain Education --      Exclude from Growth Chart --     Most recent vital signs: Vitals:   01/29/23 0938 01/29/23 1446  BP: 113/76 118/70  Pulse: 65 68  Resp: 15 15  Temp: 97.8 F (36.6 C) 98 F (36.7 C)  SpO2: 95% 96%    Constitutional: Awake and alert. Eyes: Conjunctivae are normal. Head: Atraumatic. Nose: No congestion/rhinnorhea. Mouth/Throat: Mucous membranes are moist.  Cardiovascular: Normal rate, regular rhythm. Grossly normal heart sounds.  2+  radial pulses bilaterally. Respiratory: Normal respiratory effort.  No retractions. Lungs CTAB.  Bilateral chest wall tenderness to palpation. Gastrointestinal: Soft and tender to palpation in bilateral upper quadrants as well as epigastrium with no rebound or guarding. No distention. Musculoskeletal: No lower extremity tenderness nor edema.  Neurologic:  Normal speech and language. No gross focal neurologic deficits are appreciated.    ED Results / Procedures / Treatments   Labs (all labs ordered are listed, but only abnormal results are displayed) Labs Reviewed  COMPREHENSIVE METABOLIC PANEL - Abnormal; Notable for the following components:      Result Value   CO2 21 (*)    Glucose, Bld 111 (*)    All other components within normal limits  URINALYSIS, ROUTINE W REFLEX MICROSCOPIC - Abnormal; Notable for the following components:   Color, Urine YELLOW (*)    APPearance HAZY (*)    Nitrite POSITIVE (*)    Leukocytes,Ua TRACE (*)    Bacteria, UA RARE (*)    All other components within normal limits  URINE CULTURE  LIPASE, BLOOD  CBC  TROPONIN I (HIGH SENSITIVITY)     EKG  ED ECG REPORT I, Tracy Barton, the attending physician, personally viewed and interpreted this ECG.   Date: 01/29/2023  EKG Time: 13:51  Rate: 61  Rhythm: Atrial-paced rhythm  Axis: Normal  Intervals:none  ST&T Change: None  RADIOLOGY Chest x-ray reviewed and interpreted by me with no infiltrate, Dema, or effusion.  PROCEDURES:  Critical Care performed: No  Procedures   MEDICATIONS ORDERED IN ED: Medications  morphine (PF) 4 MG/ML injection 4 mg (4 mg Intravenous Given 01/29/23 1439)  ondansetron (ZOFRAN) injection 4 mg (4 mg Intravenous Given 01/29/23 1435)  lactated ringers bolus 1,000 mL (1,000 mLs Intravenous New Bag/Given 01/29/23 1434)  iohexol (OMNIPAQUE) 300 MG/ML solution 100 mL (100 mLs Intravenous Contrast Given 01/29/23 1503)     IMPRESSION / MDM / ASSESSMENT AND PLAN / ED  COURSE  I reviewed the triage vital signs and the nursing notes.                              78 y.o. female with past medical history of hepatitis C, perforated gastric ulcer, stroke, dementia, and CKD who presents to the ED complaining of upper abdominal pain moving into her chest intermittently for the past couple of days.  Patient's presentation is most consistent with acute presentation with potential threat to life or bodily function.  Differential diagnosis includes, but is not limited to, ACS, PE, pneumonia, pneumothorax, pancreatitis, hepatitis, cholecystitis, biliary colic, GERD, UTI.  Patient nontoxic-appearing and in no acute distress, vital signs are unremarkable.  Her abdomen is soft but she does have tenderness to bilateral upper quadrants as well as her epigastrium on exam.  Labs without significant anemia, leukocytosis, lactate abnormality, or AKI.  LFTs and lipase are also unremarkable.  Urinalysis borderline for UTI, will send for culture but also further assess with CT imaging.  Cardiac pathology also considered, EKG with no acute ischemic changes and troponin within normal limits.  Chest x-ray pending at this time, we will treat symptomatically with IV morphine and Zofran, reassess following imaging.  Chest x-ray unremarkable, CT results pending at this time.  Patient turned over to oncoming rider pending CT results, if these are unremarkable patient to be appropriate for discharge home with antibiotics for UTI.      FINAL CLINICAL IMPRESSION(S) / ED DIAGNOSES   Final diagnoses:  Epigastric pain  Acute cystitis without hematuria     Rx / DC Orders   ED Discharge Orders     None        Note:  This document was prepared using Dragon voice recognition software and may include unintentional dictation errors.   Tracy Noon, MD 01/29/23 [phone]

## 2023-01-29 NOTE — ED Provider Notes (Signed)
-----------------------------------------   5:01 PM on 01/29/2023 ----------------------------------------- Patient care assumed from Dr. Larinda Buttery.  Patient CT scan has resulted showing likely gastritis as well as possible colitis versus diverticulitis.  We will cover with antibiotics as a precaution.  We will prescribe sucralfate and Protonix for the patient.  Have the patient follow-up with GI medicine.  Patient agreeable to plan of care.  Discussed with the family over the phone and they are agreeable as well.   Minna Antis, MD 01/29/23 [phone]

## 2023-02-01 LAB — URINE CULTURE: Culture: >=100000 — AB

## 2023-02-22 ENCOUNTER — Other Ambulatory Visit: Payer: Self-pay | Admitting: Internal Medicine

## 2023-02-23 ENCOUNTER — Telehealth: Payer: Self-pay | Admitting: Internal Medicine

## 2023-02-23 NOTE — Telephone Encounter (Signed)
*  STAT* If patient is at the pharmacy, call can be transferred to refill team.   1. Which medications need to be refilled? (please list name of each medication and dose if known) Potassium Chloride 10 MEQ   2. Would you like to learn more about the convenience, safety, & potential cost savings by using the [facility]? No   3. Are you open to using the [facility] (Type [facility]. ) No   4. Which pharmacy/location (including street and city if local pharmacy) is medication to be sent to? [facility] [place] [place] - [address]    5. Do they need a 30 day or 90 day supply? 90 day  Pt out of medication

## 2023-02-23 NOTE — Telephone Encounter (Signed)
Pt no longer takes this medication. Called the pharmacy to inform them. Pharmacist verbalized understanding

## 2023-03-08 ENCOUNTER — Other Ambulatory Visit: Payer: Self-pay

## 2023-03-12 ENCOUNTER — Ambulatory Visit: Payer: 59 | Admitting: Gastroenterology

## 2023-03-12 ENCOUNTER — Encounter: Payer: Self-pay | Admitting: Gastroenterology
